# Patient Record
Sex: Female | Born: 1935
Health system: Southern US, Community
[De-identification: ages and names within clinical notes are randomized; demographics above are authoritative.]

## PROBLEM LIST (undated history)

## (undated) DIAGNOSIS — K219 Gastro-esophageal reflux disease without esophagitis: Secondary | ICD-10-CM

## (undated) DIAGNOSIS — M1990 Unspecified osteoarthritis, unspecified site: Secondary | ICD-10-CM

## (undated) DIAGNOSIS — I10 Essential (primary) hypertension: Secondary | ICD-10-CM

## (undated) DIAGNOSIS — Z1509 Genetic susceptibility to other malignant neoplasm: Secondary | ICD-10-CM

## (undated) DIAGNOSIS — R011 Cardiac murmur, unspecified: Secondary | ICD-10-CM

## (undated) DIAGNOSIS — C50919 Malignant neoplasm of unspecified site of unspecified female breast: Secondary | ICD-10-CM

## (undated) DIAGNOSIS — Z1501 Genetic susceptibility to malignant neoplasm of breast: Secondary | ICD-10-CM

## (undated) DIAGNOSIS — E785 Hyperlipidemia, unspecified: Secondary | ICD-10-CM

## (undated) HISTORY — DX: Malignant neoplasm of unspecified site of unspecified female breast: C50.919

## (undated) HISTORY — DX: Gastro-esophageal reflux disease without esophagitis: K21.9

## (undated) HISTORY — DX: Genetic susceptibility to malignant neoplasm of breast: Z15.01

## (undated) HISTORY — PX: MASTECTOMY: SHX3

## (undated) HISTORY — DX: Hyperlipidemia, unspecified: E78.5

## (undated) HISTORY — PX: CHOLECYSTECTOMY: SHX55

## (undated) HISTORY — PX: CARDIAC CATHETERIZATION: SHX172

## (undated) HISTORY — DX: Genetic susceptibility to malignant neoplasm of breast: Z15.09

## (undated) HISTORY — DX: Essential (primary) hypertension: I10

## (undated) HISTORY — DX: Cardiac murmur, unspecified: R01.1

## (undated) HISTORY — DX: Unspecified osteoarthritis, unspecified site: M19.90

## (undated) HISTORY — PX: TONSILLECTOMY: SUR1361

---

## 1992-03-22 DIAGNOSIS — C50919 Malignant neoplasm of unspecified site of unspecified female breast: Secondary | ICD-10-CM

## 1992-03-22 HISTORY — DX: Malignant neoplasm of unspecified site of unspecified female breast: C50.919

## 2005-02-05 ENCOUNTER — Encounter: Admission: RE | Admit: 2005-02-05 | Discharge: 2005-02-05 | Payer: Self-pay | Admitting: Plastic Surgery

## 2005-05-03 ENCOUNTER — Ambulatory Visit (HOSPITAL_COMMUNITY): Admission: RE | Admit: 2005-05-03 | Discharge: 2005-05-03 | Payer: Self-pay | Admitting: Plastic Surgery

## 2005-05-06 ENCOUNTER — Encounter: Admission: RE | Admit: 2005-05-06 | Discharge: 2005-05-06 | Payer: Self-pay | Admitting: Plastic Surgery

## 2005-05-26 ENCOUNTER — Encounter (INDEPENDENT_AMBULATORY_CARE_PROVIDER_SITE_OTHER): Payer: Self-pay | Admitting: Specialist

## 2005-05-26 ENCOUNTER — Ambulatory Visit (HOSPITAL_COMMUNITY): Admission: RE | Admit: 2005-05-26 | Discharge: 2005-05-27 | Payer: Self-pay | Admitting: Plastic Surgery

## 2005-07-14 ENCOUNTER — Ambulatory Visit (HOSPITAL_COMMUNITY): Admission: RE | Admit: 2005-07-14 | Discharge: 2005-07-14 | Payer: Self-pay | Admitting: Plastic Surgery

## 2007-10-10 ENCOUNTER — Inpatient Hospital Stay (HOSPITAL_COMMUNITY): Admission: AD | Admit: 2007-10-10 | Discharge: 2007-10-12 | Payer: Self-pay | Admitting: *Deleted

## 2007-10-11 ENCOUNTER — Encounter (INDEPENDENT_AMBULATORY_CARE_PROVIDER_SITE_OTHER): Payer: Self-pay | Admitting: *Deleted

## 2008-02-16 ENCOUNTER — Emergency Department (HOSPITAL_COMMUNITY): Admission: EM | Admit: 2008-02-16 | Discharge: 2008-02-16 | Payer: Self-pay | Admitting: Emergency Medicine

## 2008-04-05 ENCOUNTER — Encounter: Admission: RE | Admit: 2008-04-05 | Discharge: 2008-04-05 | Payer: Self-pay | Admitting: Plastic Surgery

## 2009-07-15 ENCOUNTER — Encounter: Admission: RE | Admit: 2009-07-15 | Discharge: 2009-07-15 | Payer: Self-pay | Admitting: Plastic Surgery

## 2010-02-10 ENCOUNTER — Encounter: Admission: RE | Admit: 2010-02-10 | Discharge: 2010-02-10 | Payer: Self-pay | Admitting: Endocrinology

## 2010-02-17 ENCOUNTER — Encounter (INDEPENDENT_AMBULATORY_CARE_PROVIDER_SITE_OTHER): Payer: Self-pay | Admitting: Endocrinology

## 2010-02-17 ENCOUNTER — Ambulatory Visit (HOSPITAL_COMMUNITY): Admission: RE | Admit: 2010-02-17 | Discharge: 2010-02-17 | Payer: Self-pay | Admitting: Endocrinology

## 2010-02-18 ENCOUNTER — Ambulatory Visit (HOSPITAL_BASED_OUTPATIENT_CLINIC_OR_DEPARTMENT_OTHER): Admission: RE | Admit: 2010-02-18 | Discharge: 2010-02-19 | Payer: Self-pay | Admitting: Plastic Surgery

## 2010-04-12 ENCOUNTER — Encounter: Payer: Self-pay | Admitting: Plastic Surgery

## 2010-05-05 ENCOUNTER — Other Ambulatory Visit: Payer: Self-pay | Admitting: Endocrinology

## 2010-05-05 DIAGNOSIS — M8580 Other specified disorders of bone density and structure, unspecified site: Secondary | ICD-10-CM

## 2010-05-12 ENCOUNTER — Other Ambulatory Visit: Payer: Self-pay

## 2010-06-02 LAB — GLUCOSE, CAPILLARY: Glucose-Capillary: 101 mg/dL — ABNORMAL HIGH (ref 70–99)

## 2010-06-02 LAB — POCT I-STAT, CHEM 8
BUN: 13 mg/dL (ref 6–23)
Calcium, Ion: 1.05 mmol/L — ABNORMAL LOW (ref 1.12–1.32)
Chloride: 103 mEq/L (ref 96–112)
Creatinine, Ser: 0.7 mg/dL (ref 0.4–1.2)
Glucose, Bld: 108 mg/dL — ABNORMAL HIGH (ref 70–99)
HCT: 43 % (ref 36.0–46.0)
Hemoglobin: 14.6 g/dL (ref 12.0–15.0)
Potassium: 3.5 mEq/L (ref 3.5–5.1)
Sodium: 140 mEq/L (ref 135–145)
TCO2: 30 mmol/L (ref 0–100)

## 2010-06-16 ENCOUNTER — Other Ambulatory Visit: Payer: Self-pay

## 2010-06-23 ENCOUNTER — Ambulatory Visit
Admission: RE | Admit: 2010-06-23 | Discharge: 2010-06-23 | Disposition: A | Discharge status: Home / Self Care | Payer: Medicare Other | Source: Ambulatory Visit | Attending: Endocrinology | Admitting: Endocrinology

## 2010-06-23 DIAGNOSIS — M8580 Other specified disorders of bone density and structure, unspecified site: Secondary | ICD-10-CM

## 2010-07-24 ENCOUNTER — Encounter: Payer: Self-pay | Admitting: Cardiovascular Disease

## 2010-07-24 DIAGNOSIS — K219 Gastro-esophageal reflux disease without esophagitis: Secondary | ICD-10-CM | POA: Insufficient documentation

## 2010-07-24 DIAGNOSIS — M1990 Unspecified osteoarthritis, unspecified site: Secondary | ICD-10-CM | POA: Insufficient documentation

## 2010-07-24 DIAGNOSIS — E785 Hyperlipidemia, unspecified: Secondary | ICD-10-CM | POA: Insufficient documentation

## 2010-07-24 DIAGNOSIS — R7303 Prediabetes: Secondary | ICD-10-CM | POA: Insufficient documentation

## 2010-07-24 DIAGNOSIS — I10 Essential (primary) hypertension: Secondary | ICD-10-CM | POA: Insufficient documentation

## 2010-07-30 ENCOUNTER — Ambulatory Visit: Payer: Medicare Other | Admitting: Cardiovascular Disease

## 2010-08-04 NOTE — Discharge Summary (Signed)
NAMEABBY, TUCHOLSKI NO.:  [id number]   MEDICAL RECORD NO.:  [id number]          PATIENT TYPE:  INP   LOCATION:  6523                         FACILITY:  [facility]   PHYSICIAN:  Elmore Guise., M.D.DATE OF BIRTH:  Apr 20, 1932   DATE OF ADMISSION:  10/10/2007  DATE OF DISCHARGE:  10/12/2007                               DISCHARGE SUMMARY   DISCHARGE DIAGNOSES:  1. Chest pain.  2. Normal-appearing coronary arteries by cardiac catheterization.  3. Hypertension.  4. Dyslipidemia.  5. Diabetes mellitus.  6. Hypothyroidism.  7. Arthritis.   HISTORY OF PRESENT ILLNESS:  Ms. Carolyn Stare is a 77 year old white female  who presented with 2 day history of increasing chest pain.  She was  referred for hospital admission because of her symptoms and multiple  cardiac risk factors.   HOSPITAL COURSE:  The patient's hospital course was uncomplicated.  She  was admitted on July 21, had serial cardiac enzymes performed, which  were negative.  She was placed on long-acting nitrates, aspirin, low-  dose beta-blocker. She underwent echocardiogram on October 11, 2007, which  showed normal LV size and function with an EF of 55-60%.  She had no  wall motion abnormalities.  She had mild aortic valve regurgitation.  She continued to have chest pain.  Her metformin was held.  She  underwent cardiac catheterization on October 12, 2007.  This showed normal-  appearing coronary arteries, normal LV systolic function with an EF of  65% and normal aortic root, only mild proximal ectasia was noted, which  was consistent with her long history of hypertension.  Her post cath  procedure was uncomplicated.  She has been up walking around without any  significant problems.  She has had no further chest pain following her  catheterization.  She will be discharged home today to continue the  following medications:  1. Lotensin/hydrochlorothiazide 20/25 mg 1/2 tablet daily.  2. Synthroid 112 mcg daily.  3.  Mobic half tablet p.r.n.  4. Percocet 1-2 tablets every 4-6 hours as needed for pain.  5. Imdur 30 mg one p.o. daily.  6. Protonix 40 mg one p.o. daily.  7. Baby aspirin 81 mg once daily.  8. The patient was told to hold her metformin for the next 48 hours      and she may resume her dose of 500 mg 1 tablet twice daily.  9. She is to stop her Norvasc.   Her blood pressure on low-dose Lotensin and Imdur remained normal in the  90-110 range.   DISCHARGE INSTRUCTIONS:  Include no heavy lifting or strenuous  activities for the next 48 hours and holding her metformin for the next  48 hours.  She is to call the office if she has any swelling at her cath  site.  She is also notify us if she has any bleeding.  Her follow-up  appointment will be with Dr. [name] in 2 weeks.  Otherwise, she will  keep her regular scheduled visit with Dr. Lucianne Muss.      Elmore Guise., M.D.  Electronically Signed  TWK/MEDQ  D:  10/12/2007  T:  10/12/2007  Job:  [id number]   cc:   Reather Littler, M.D.

## 2010-08-04 NOTE — H&P (Signed)
[name], [name] NO.:  [id number]   MEDICAL RECORD NO.:  [id number]          PATIENT TYPE:  INP   LOCATION:  6523                         FACILITY:  [facility]   PHYSICIAN:  Elmore Guise., M.D.DATE OF BIRTH:  02/27/1933   DATE OF ADMISSION:  10/10/2007  DATE OF DISCHARGE:                              HISTORY & PHYSICAL   PRIMARY CARE PHYSICIAN:  Reather Littler, M.D.   HISTORY OF PRESENT ILLNESS:  [name] is a 77 year old white female,  past medical history of type 2 diabetes mellitus, hypertension,  hypothyroidism, dyslipidemia, who presents for chest discomfort for the  last 2 days.  The patient reports a normal state of health until 2 days  ago, when she started feeling a pressure and tightness in her chest.  This would come and go throughout the day, initially stating that it was  only happening with activity; however, now it has been happening with  and without activity.  She will have this tightness associated with  shortness of breath.  She will try to sit down and she will feel like  I'm smothering.  She has had progression of her symptoms, now coming  on more frequently and lasting longer.  Her symptoms progressed  yesterday when she about called 911 twice.  She has not had any past  history of coronary disease.  Normally she is up and around, however,  has chronic knee problems.  She goes and has injections on her knees,  however, typically can ambulate in and around the house as well as with  shopping without any significant problems.  She has had no orthopnea.  She did have PND last evening.  She has had no fever or productive  cough.  No significant palpitations.  Today she started having episodes  of presyncope, typically on standing.  She has had no abdominal pain or  indigestion.  No nausea, vomiting or diarrhea.   REVIEW OF SYSTEMS:  As per HPI.  All others are negative.   CURRENT MEDICATIONS:  1. Lotensin 20/25 mg once daily.  2.  Norvasc 10 mg daily.  3. Metformin 500 mg 1 tablet twice daily.  4. Synthroid 112 mcg daily.  5. Percocet p.r.n.  6. Euflexxa knee injection 3 times per week.  7. Mobic 15 mg 1/2 tablet daily.   ALLERGIES:  PENICILLIN, CAUSING ITCHING AND EDEMA.   FAMILY HISTORY:  Positive for bladder cancer, pancreatic cancer, lung  cancer.  She has a remote family history of her maternal grandmother  dying of a heart attack.   SOCIAL HISTORY:  She is widowed and currently retired.  Does not  exercise.  No tobacco.  Rare alcohol intake.  Does drink 1 cup of coffee  and 2 to 3 glasses of tea daily.   PAST SURGICAL HISTORY:  Includes left breast mastectomy with  reconstruction and redo of her left breast reconstruction.  She has also  had a hysterectomy, cholecystectomy as well as tonsillectomy.   PHYSICAL EXAMINATION:  VITAL SIGNS:  Her blood pressure is 130/90 with  heart rate of 80.  Her  temperature is 98.1.  O2 sat is 98% on room air.  GENERAL:  She is a very pleasant white female, alert and oriented x 4,  in no acute distress.  HEENT:  Appeared normal.  NECK:  Supple.  No lymphadenopathy.  There are 2+ carotids.  No JVD.  No  bruits.  LUNGS:  Clear.  HEART:  Regular with normal S1, S2, a 2/6 systolic ejection murmur heard  loudest at the aortic area.  ABDOMEN:  Soft, nontender, nondistended.  No rebound or guarding.  EXTREMITIES:  Warm with 2+ pulses and no significant edema.   Her EKG was reviewed, showed normal sinus rhythm, rate of 80 per minute,  normal axis, normal intervals with no significant ST/T wave changes.  She did have poor R-wave progression across the precordial leads.  Her  chest x-ray was also done and reported to be normal.  Blood work is  still pending at time of dictation.   IMPRESSION:  1. New-onset chest pain.  2. Multiple cardiac risk factors, including hypertension, diabetes      mellitus, dyslipidemia.   PLAN:  The patient will be admitted for rule out  myocardial infarction.  We will check an echo to evaluate her aortic valve.  I discussed that we  would determine stress versus cardiac cath depending on her progress  during her hospitalization.  She will be started empirically on nitro  paste 1/2 inch to her chest wall every 8 hours, aspirin, Lovenox,  metoprolol 12.5 mg twice daily.  We will continue her Lotensin, Norvasc.  I will hold her metformin in case contrast exposure is needed.      Elmore Guise., M.D.  Electronically Signed     TWK/MEDQ  D:  10/10/2007  T:  10/10/2007  Job:  [id number]   cc:   Reather Littler, M.D.

## 2010-08-07 NOTE — Op Note (Signed)
NAMETEANA, LINDAHL NO.:  [id number]   MEDICAL RECORD NO.:  [id number]          PATIENT TYPE:  OIB   LOCATION:  5706                         FACILITY:  [facility]   PHYSICIAN:  Consuello Bossier., M.D.DATE OF BIRTH:  [birth year]   DATE OF PROCEDURE:  05/26/2005  DATE OF DISCHARGE:                                 OPERATIVE REPORT   PREOPERATIVE DIAGNOSIS:  Mechanical complication mammary prosthesis and  capsular contracture with calcification of right mammary implant.   POSTOPERATIVE DIAGNOSIS:  Mechanical complication mammary prosthesis and  capsular contracture with calcification of right mammary implant.   OPERATION:  Bilateral Total capsulotomy on the right and replacement of  bilateral implants on the right Mentor smooth wall saline pill prosthesis,  275 mL normal saline added and on the left, Mentor smooth wall saline pill  prosthesis, 600 mL normal saline added.   SURGEON:  Pleas Patricia, M.D.   ANESTHESIA:  General endotracheal anesthesia.   FINDINGS:  The patient had a previous left modified radical mastectomy  followed by tissue expansion and replacement with an implant.  Also she had  had a previous right augmentation mammoplasty many years ago with a Jelco  prosthesis and was noted to have a probable mechanical complication of both  implants as well as a capsular contracture on the right.  The above surgical  procedure was carried out.   DESCRIPTION OF PROCEDURE:  Patient was brought to the operating room, marked  off the planned scar excision of the previous inferior circumareolar scar as  well as the widened scar on the upper outer aspect of the transverse  mastectomy scar. She was given a general endotracheal anesthesia, prepped  with Betadine and draped about both breasts in sterile fashion.  Initially,  the right side was approached by removing the widened scar and dissection  continued down through the breast tissue with electrocautery  unit until the  anterior capsule was encountered.  With fiberoptic illumination, a total  capsulectomy was able to be performed.  The implant was obviously an older  gel filled implant with posterior patches and there was some gel bleed.  There was no gross rupture of the implant bag.  There were some  calcifications of the capsule and the capsule and implant were submitted for  pathological examination.  The wound was irrigated with normal saline and an  implant which was a Mentor smooth wall saline pill prosthesis, was placed  and 275 mL of normal saline added.  It should be noted that the right  implant that was removed weighed just over 200 g.  On the left side,  previous scar was excised and the pectoralis major muscle was split in the  direction of its fibers and anterior capsule opened and what appeared to be  a saline gel prosthesis was removed.  It is not known exactly how much of  the saline was still present.  This was a somewhat textured implant and was,  other than possible loss of some of the saline, was noted to be essentially  intact.  It was weighed and weighed  approximately 700 g.  The implant that  had been selected to be used would only go up to approximately 425 mL and I  was able to get a larger implant from my office which was the largest one we  had, which was a Mentor smooth wall saline pill prosthesis, and was  overfilled 75 mL for a total of 600 mL on this left reconstructed breast  side.  Muscle was closed with interrupted 3-0 Vicryl and the skin was closed  after the skin edges were undermined with interrupted subcutaneous 3-0  Monocryl, followed by running subcuticular 4-0 Monocryl.  It should be  mentioned, on the right, the breast tissue was closed similarly with  interrupted 3-0 Monocryl  to the breast tissue, also to the subcutaneous tissues and running  subcuticular 4-0 Monocryl.  Steri-Strips, Xeroform, 4x8s, ABD, and  Circumpress, Ace bandage were  applied.  The patient tolerated the procedure  well and was able to be discharged from the operating room to the recovery  room, subsequently to be admitted for overnight observation.      Consuello Bossier., M.D.  Electronically Signed     HH/MEDQ  D:  05/26/2005  T:  05/27/2005  Job:  [id number]

## 2010-08-07 NOTE — Op Note (Signed)
NAMEWESLIE, RASMUS NO.:  [id number]   MEDICAL RECORD NO.:  [id number]          PATIENT TYPE:  AMB   LOCATION:  SDS                          FACILITY:  [facility]   PHYSICIAN:  Consuello Bossier., M.D.DATE OF BIRTH:  September 02, 1932   DATE OF PROCEDURE:  07/14/2005  DATE OF DISCHARGE:  07/14/2005                                 OPERATIVE REPORT   PREOPERATIVE DIAGNOSIS:  Personal history, carcinoma of the breast, and  acquired absence, left breast, with mammary asymmetry.   POSTOPERATIVE DIAGNOSIS:  Personal history, carcinoma of the breast, and  acquired absence, left breast, with mammary asymmetry.   OPERATION PERFORMED:  Left breast stage reconstruction with removal of 600  cc implant and replacement with Mentor, smooth walled, moderate plus  profile, 875 cc normal saline added.   SURGEON:  Pleas Patricia, M.D.   ANESTHESIA:  General endotracheal.   FINDINGS:  The patient has had previous left modified radical mastectomy for  cancer of the breast.  Also, had some implant problems for which exchange of  bilateral implants was performed approximately six weeks ago.  The records  were not available for the size of the implants, and it appeared that she  needed a larger implant, and an additional 275 cc of normal saline were  added to this current implant above and beyond the previous removed implant  to give her a similar volume, compared to the implants that had been removed  at surgery and replaced.  Also, a capsulotomy superiorly and slightly  inferiorly was performed.   PROCEDURE:  The patient was brought to the operating room, given a general  endotracheal anesthetic, prepped with Betadine, draped about both breasts in  a sterile fashion.  The previous oblique incision was excised along the  medial aspect, and dissection continued down.  The pectoralis major muscle  was encountered.  With the electrocautery unit, the muscle and anterior  capsule were  split in the direction of the muscle fibers.  The 600 cc  implant was removed.  With fiberoptic elimination, a somewhat superior  capsulotomy and a slight inferior capsulotomy was performed.  The new  implant was placed into position, and it was inflated to 775 cc, which was  by previous measurements of the implants that were removed, the additional  amount required.  The left breast implant that was initially removed did  have a saline component which the patient and I had felt had leaked probably  the 100 cc.  So, an additional 100 cc were placed in this current implant to  give a total volume of 875 cc.  The breasts appeared to be reasonably  symmetrical at this point.  The muscle was closed with an interrupted #3-0  Vicryl, and the subcutaneous tissues with interrupted #3-0 Monocryl, and the  skin with running subcuticular #4-0 Monocryl.  Steri-  Strips, Xeroform, Fluffs, ABDs, and a circumfrential Ace bandage were  applied.  The patient tolerated the procedure well, was able to be  discharged from the operating room to the recovery room, subsequently to be  followed by me as  an outpatient.      Consuello Bossier., M.D.  Electronically Signed     HH/MEDQ  D:  07/14/2005  T:  07/14/2005  Job:  [id number]

## 2010-08-11 ENCOUNTER — Other Ambulatory Visit: Payer: Self-pay | Admitting: Endocrinology

## 2010-08-11 ENCOUNTER — Ambulatory Visit
Admission: RE | Admit: 2010-08-11 | Discharge: 2010-08-11 | Disposition: A | Discharge status: Home / Self Care | Payer: Medicare Other | Source: Ambulatory Visit | Attending: Endocrinology | Admitting: Endocrinology

## 2010-08-11 DIAGNOSIS — R059 Cough, unspecified: Secondary | ICD-10-CM

## 2010-08-11 DIAGNOSIS — R05 Cough: Secondary | ICD-10-CM

## 2010-08-18 ENCOUNTER — Ambulatory Visit: Payer: Medicare Other | Admitting: Cardiovascular Disease

## 2010-08-20 ENCOUNTER — Other Ambulatory Visit: Payer: Self-pay | Admitting: *Deleted

## 2010-08-20 MED ORDER — ISOSORBIDE MONONITRATE ER 30 MG PO TB24: 30.0000 mg | ORAL_TABLET | Freq: Every day | ORAL | Status: DC

## 2010-08-20 NOTE — Telephone Encounter (Signed)
Fax received from pharmacy. Refill completed. Jodette Ritha Sampedro RN  

## 2010-09-17 ENCOUNTER — Encounter: Payer: Self-pay | Admitting: Cardiovascular Disease

## 2010-09-17 ENCOUNTER — Ambulatory Visit (INDEPENDENT_AMBULATORY_CARE_PROVIDER_SITE_OTHER): Payer: Medicare Other | Admitting: Cardiovascular Disease

## 2010-09-17 DIAGNOSIS — E785 Hyperlipidemia, unspecified: Secondary | ICD-10-CM

## 2010-09-17 DIAGNOSIS — R079 Chest pain, unspecified: Secondary | ICD-10-CM

## 2010-09-17 MED ORDER — ISOSORBIDE MONONITRATE ER 30 MG PO TB24: 30.0000 mg | ORAL_TABLET | Freq: Every day | ORAL | Status: DC

## 2010-09-17 NOTE — Assessment & Plan Note (Signed)
Atypical cp.  Never occurs with exercise - just first thing in the AM and is associated with twisting of her torso.  She may have sleep apnea.  I recommended a Lexiscan myoview - but she declined.  She will try to get out and exercise.  She's limited by arthritis.  Will see her again in 1 year.

## 2010-09-17 NOTE — Progress Notes (Signed)
Angela [name] Ward Angela [name] Date of Birth  [birth year] [facility] [address]     [address] [place], [place]  [address] [phone]  Fax  [phone]  History of Present Illness:  Angela [name] Is an elderly female who is a previous patient of Dr. [name]. She has had some chest pain in the past. She has had a heart catheterization which revealed smooth and normal coronary arteries. Her left ventricular systolic function has been normal with an ejection fraction of 55-60%. She has mild aortic insufficiency, mild mitral regurgitation, and mild tricuspid regurgitation.  She also has a history of dyslipidemia, hypertension,  and diabetes mellitus. She will does not like going to the doctor.   Pt has a history of atypical CP.  Normal coronaries in the past.  Has developed cp - usually occurs first thing in the morning when she's brushing her hair.  Not related to exertion.  No syncope, no dyspnea.    Current Outpatient Prescriptions on File Prior to Visit  Medication Sig Dispense Refill  . benazepril-hydrochlorthiazide (LOTENSIN HCT) 20-25 MG per tablet Take 1 tablet by mouth daily. 1/2 DAILY       . isosorbide mononitrate (IMDUR) 30 MG 24 hr tablet Take 1 tablet (30 mg total) by mouth daily.  30 tablet  0  . levothyroxine (SYNTHROID, LEVOTHROID) 112 MCG tablet Take 100 mcg by mouth daily.       . meloxicam (MOBIC) 15 MG tablet Take 15 mg by mouth daily.        . metFORMIN (GLUCOPHAGE) 500 MG tablet Take 500 mg by mouth 2 (two) times daily with a meal.        . oxyCODONE-acetaminophen (PERCOCET) 5-325 MG per tablet Take 1 tablet by mouth every 4 (four) hours as needed.        . pantoprazole (PROTONIX) 40 MG tablet Take 40 mg by mouth daily.        . Sodium Hyaluronate, Viscosup, (EUFLEXXA IX) Inject into the articular space as directed.       Marland Kitchen DISCONTD: aspirin 81 MG tablet Take 81 mg by mouth daily.          Allergies  Allergen Reactions  . Gadolinium      Desc:  pt states she had nausea after receiving MAGNEVIST for breast imaging   . Penicillins     Past Medical History  Diagnosis Date  . Hypertension   . Dyslipidemia   . Diabetes mellitus   . Arthritis   . GERD (gastroesophageal reflux disease)     Past Surgical History  Procedure Date  . Cardiac catheterization     NORMAL CORONARY ARTERIES  . Mastectomy     LEFT BREAST  . Cholecystectomy   . Tonsillectomy     History  Smoking status  . Never Smoker   Smokeless tobacco  . Not on file    History  Alcohol Use No    Family History  Problem Relation Age of Onset  . Heart attack Maternal Grandmother   . Cancer      BLADDER  . Pancreatic cancer    . Lung cancer    . Cancer Mother   . Cancer Father   . Ovarian cancer Sister   . Hypertension Sister   . Lung cancer Brother     Reviw of Systems:  Reviewed in the HPI.  All other systems are negative.  Physical Exam: BP 138/82  Pulse 89  Ht 5\' 8"  (1.727  m)  Wt 235 lb (106.595 kg)  BMI 35.73 kg/m2 The patient is alert and oriented x 3.  The mood and affect are normal.   Skin: warm and dry.  Color is normal.    HEENT:   the sclera are nonicteric.  The mucous membranes are moist.  The carotids are 2+ without bruits.  There is no thyromegaly.  There is no JVD.    Lungs: clear.  The chest wall is non tender.    Heart: regular rate with a normal S1 and S2.  There is a 2/6 systolic murmur. The PMI is not displaced.     Abdomin: good bowel sounds.  There is no guarding or rebound.  There is no hepatosplenomegaly or tenderness.  There are no masses.   Extremities:  no clubbing, cyanosis, or edema.  The legs are without rashes.  The distal pulses are intact.   Neuro:  Cranial nerves II - XII are intact.  Motor and sensory functions are intact.    The gait is normal.  ECG: NSR, LAE.   Assessment / Plan:

## 2010-09-17 NOTE — Assessment & Plan Note (Signed)
I would prefer that she take Atorvaststin instead of Simvastatin.  She will discuss with Dr. Lucianne Muss.

## 2010-09-24 NOTE — Op Note (Signed)
Angela [name], ALWIN NO.:  [id number]  MEDICAL RECORD NO.:  [id number]          PATIENT TYPE:  AMB  LOCATION:  DSC                          FACILITY:  [facility]  PHYSICIAN:  Consuello Bossier., M.D.DATE OF BIRTH:  Feb 02, 1933  DATE OF PROCEDURE:  02/18/2010 DATE OF DISCHARGE:                              OPERATIVE REPORT   PREOPERATIVE DIAGNOSIS:  Personal history carcinoma of breast with evidence of left breast and a right breast capsular contracture and a left breast implant high in position with breast asymmetry.  POSTOPERATIVE DIAGNOSIS:  Personal history carcinoma of breast with evidence of left breast and a right breast capsular contracture and a left breast implant high in position with breast asymmetry.  OPERATION:  Bilateral open capsulotomy, implant removal, and replacement with Mentor smooth-walled 250 mL gel moderate profile on right and 800 mL gentle high-profile on the left.  SURGEON:  Pleas Patricia, MD  ASSISTANT:  Loreta Ave, MD  ANESTHESIA:  General endotracheal.  FINDINGS:  The patient has had previous left modified radical mastectomy for carcinoma of the breast and undergone multiple breast reconstructions including prosthesis on the left, which was somewhat high in position and she had, had a previous right breast augmentation with a significant amount of capsular contracture.  The above procedure was performed.  PROCEDURE:  The patient was brought to the operating room having been marked for the planned surgical procedure to do a level of implants, capsulotomy particularly on the right side and have her overcome the capsular contracture and deliver the implants on the left side and replace it with a high profile implant.  In both cases, the patient was desirous of gel filled implants.  She had been given general endotracheal anesthetic, prepped with Betadine, draped sterilely. Initially the right breast was approached  by excision of the inferior circumareolar scar and the dissection continued down through the breast tissue until the anterior capsule was encountered.  Capsule opened implant  and a saline filled implant removed.  The capsule itself was fairly gossamer and thin in consistency and a generous open capsulotomy was performed in all directions and some of the anterior dome also incised.  The wound was irrigated with normal saline.  There was noted to be hemostasis.  The 250 mL gel-filled moderate profile Mentor implant was placed and the capsule and breast tissue closed in layers of interrupted #3-0 Monocryl followed by running subcuticular #4-0 Monocryl.  The patient had a somewhat prominent scar along the medial aspect of her previous oblique transverse mastectomy incision and that was excised and the rest of the incision made with excision of the scar and then dissection continued down, the muscle was split in direction of its fibers, and the anterior capsule opened and the 850 mL saline-filled implant was removed.  The fiberoptic illumination, inferior and medial primary capsulotomy was performed to try to position implant in somewhat of a low and more medial position.  Bleeding was controlled with electrocautery and there was noted to be good hemostasis.  The 800 mL Mentor high profile gel-filled prosthesis was placed, noted to be reasonable in terms  of its position and size related to the opposite side.  At this point, the capsule of breast tissue and muscle were closed with interrupted #3-0 Vicryl and the subcutaneous tissues closed with interrupted #3-0 Monocryl, skin closed with running subcuticular #4- 0 Monocryl.  Steri-Strips, 4 x 8, ABD, and circum thoracic compression dressing were applied.  The patient tolerated the procedure well and was able to be discharged from the operating room to the recovery room subsequently to be admitted for overnight observation.     Consuello Bossier., M.D.     HH/MEDQ  D:  02/18/2010  T:  02/18/2010  Job:  [id number]  Electronically Signed by Pleas Patricia M.D. on 09/24/2010 10:10:05 AM

## 2010-12-18 LAB — COMPREHENSIVE METABOLIC PANEL
Alkaline Phosphatase: 66
BUN: 13
CO2: 30
Chloride: 103
Creatinine, Ser: 0.64
GFR calc non Af Amer: >60
Glucose, Bld: 102 — ABNORMAL HIGH
Potassium: 3.4 — ABNORMAL LOW
Total Bilirubin: 1

## 2010-12-18 LAB — CARDIAC PANEL(CRET KIN+CKTOT+MB+TROPI)
CK, MB: 0.6
Relative Index: INVALID
Relative Index: INVALID
Total CK: 35
Total CK: 56
Troponin I: 0.02

## 2010-12-18 LAB — BASIC METABOLIC PANEL
BUN: 13
BUN: 17
Calcium: 8.8
Chloride: 104
Creatinine, Ser: 0.58
Creatinine, Ser: 0.67
GFR calc non Af Amer: >60
Glucose, Bld: 108 — ABNORMAL HIGH
Glucose, Bld: 94
Potassium: 3.3 — ABNORMAL LOW
Potassium: 4

## 2010-12-18 LAB — URINALYSIS, ROUTINE W REFLEX MICROSCOPIC
Glucose, UA: NEGATIVE
Nitrite: NEGATIVE
Specific Gravity, Urine: 1.026
pH: 6

## 2010-12-18 LAB — LIPID PANEL
Cholesterol: 182
LDL Cholesterol: 124 — ABNORMAL HIGH
Triglycerides: 79

## 2010-12-18 LAB — CBC
HCT: 43.3
MCV: 89.2
Platelets: 207

## 2010-12-18 LAB — D-DIMER, QUANTITATIVE: D-Dimer, Quant: 0.48

## 2010-12-18 LAB — PROTIME-INR
INR: 0.9
Prothrombin Time: 12

## 2011-03-24 DIAGNOSIS — M1710 Unilateral primary osteoarthritis, unspecified knee: Secondary | ICD-10-CM | POA: Diagnosis not present

## 2011-04-21 DIAGNOSIS — M1710 Unilateral primary osteoarthritis, unspecified knee: Secondary | ICD-10-CM | POA: Diagnosis not present

## 2011-04-27 DIAGNOSIS — E785 Hyperlipidemia, unspecified: Secondary | ICD-10-CM | POA: Diagnosis not present

## 2011-04-27 DIAGNOSIS — R7301 Impaired fasting glucose: Secondary | ICD-10-CM | POA: Diagnosis not present

## 2011-04-27 DIAGNOSIS — E039 Hypothyroidism, unspecified: Secondary | ICD-10-CM | POA: Diagnosis not present

## 2011-04-27 DIAGNOSIS — E559 Vitamin D deficiency, unspecified: Secondary | ICD-10-CM | POA: Diagnosis not present

## 2011-05-04 DIAGNOSIS — E785 Hyperlipidemia, unspecified: Secondary | ICD-10-CM | POA: Diagnosis not present

## 2011-05-04 DIAGNOSIS — R7301 Impaired fasting glucose: Secondary | ICD-10-CM | POA: Diagnosis not present

## 2011-05-04 DIAGNOSIS — I10 Essential (primary) hypertension: Secondary | ICD-10-CM | POA: Diagnosis not present

## 2011-05-04 DIAGNOSIS — M899 Disorder of bone, unspecified: Secondary | ICD-10-CM | POA: Diagnosis not present

## 2011-05-04 DIAGNOSIS — M949 Disorder of cartilage, unspecified: Secondary | ICD-10-CM | POA: Diagnosis not present

## 2011-05-04 DIAGNOSIS — E039 Hypothyroidism, unspecified: Secondary | ICD-10-CM | POA: Diagnosis not present

## 2011-05-19 DIAGNOSIS — M1710 Unilateral primary osteoarthritis, unspecified knee: Secondary | ICD-10-CM | POA: Diagnosis not present

## 2011-06-09 DIAGNOSIS — M1710 Unilateral primary osteoarthritis, unspecified knee: Secondary | ICD-10-CM | POA: Diagnosis not present

## 2011-06-11 DIAGNOSIS — L578 Other skin changes due to chronic exposure to nonionizing radiation: Secondary | ICD-10-CM | POA: Diagnosis not present

## 2011-06-11 DIAGNOSIS — L570 Actinic keratosis: Secondary | ICD-10-CM | POA: Diagnosis not present

## 2011-06-30 DIAGNOSIS — M1710 Unilateral primary osteoarthritis, unspecified knee: Secondary | ICD-10-CM | POA: Diagnosis not present

## 2011-07-28 DIAGNOSIS — M1710 Unilateral primary osteoarthritis, unspecified knee: Secondary | ICD-10-CM | POA: Diagnosis not present

## 2011-08-24 DIAGNOSIS — E039 Hypothyroidism, unspecified: Secondary | ICD-10-CM | POA: Diagnosis not present

## 2011-08-24 DIAGNOSIS — E785 Hyperlipidemia, unspecified: Secondary | ICD-10-CM | POA: Diagnosis not present

## 2011-08-24 DIAGNOSIS — M899 Disorder of bone, unspecified: Secondary | ICD-10-CM | POA: Diagnosis not present

## 2011-08-24 DIAGNOSIS — R7301 Impaired fasting glucose: Secondary | ICD-10-CM | POA: Diagnosis not present

## 2011-08-25 DIAGNOSIS — M1710 Unilateral primary osteoarthritis, unspecified knee: Secondary | ICD-10-CM | POA: Diagnosis not present

## 2011-08-31 DIAGNOSIS — M1710 Unilateral primary osteoarthritis, unspecified knee: Secondary | ICD-10-CM | POA: Diagnosis not present

## 2011-08-31 DIAGNOSIS — E039 Hypothyroidism, unspecified: Secondary | ICD-10-CM | POA: Diagnosis not present

## 2011-08-31 DIAGNOSIS — M949 Disorder of cartilage, unspecified: Secondary | ICD-10-CM | POA: Diagnosis not present

## 2011-08-31 DIAGNOSIS — E7800 Pure hypercholesterolemia, unspecified: Secondary | ICD-10-CM | POA: Diagnosis not present

## 2011-08-31 DIAGNOSIS — R7301 Impaired fasting glucose: Secondary | ICD-10-CM | POA: Diagnosis not present

## 2011-08-31 DIAGNOSIS — M899 Disorder of bone, unspecified: Secondary | ICD-10-CM | POA: Diagnosis not present

## 2011-08-31 DIAGNOSIS — IMO0002 Reserved for concepts with insufficient information to code with codable children: Secondary | ICD-10-CM | POA: Diagnosis not present

## 2011-09-14 DIAGNOSIS — L089 Local infection of the skin and subcutaneous tissue, unspecified: Secondary | ICD-10-CM | POA: Diagnosis not present

## 2011-09-14 DIAGNOSIS — D485 Neoplasm of uncertain behavior of skin: Secondary | ICD-10-CM | POA: Diagnosis not present

## 2011-09-14 DIAGNOSIS — L723 Sebaceous cyst: Secondary | ICD-10-CM | POA: Diagnosis not present

## 2011-09-15 DIAGNOSIS — M1710 Unilateral primary osteoarthritis, unspecified knee: Secondary | ICD-10-CM | POA: Diagnosis not present

## 2011-10-13 DIAGNOSIS — M1710 Unilateral primary osteoarthritis, unspecified knee: Secondary | ICD-10-CM | POA: Diagnosis not present

## 2011-10-18 DIAGNOSIS — L98499 Non-pressure chronic ulcer of skin of other sites with unspecified severity: Secondary | ICD-10-CM | POA: Diagnosis not present

## 2011-10-18 DIAGNOSIS — L259 Unspecified contact dermatitis, unspecified cause: Secondary | ICD-10-CM | POA: Diagnosis not present

## 2011-10-18 DIAGNOSIS — D485 Neoplasm of uncertain behavior of skin: Secondary | ICD-10-CM | POA: Diagnosis not present

## 2011-11-10 DIAGNOSIS — M1710 Unilateral primary osteoarthritis, unspecified knee: Secondary | ICD-10-CM | POA: Diagnosis not present

## 2011-11-26 DIAGNOSIS — L080 Pyoderma: Secondary | ICD-10-CM | POA: Diagnosis not present

## 2011-11-27 DIAGNOSIS — E7800 Pure hypercholesterolemia, unspecified: Secondary | ICD-10-CM | POA: Diagnosis not present

## 2011-11-27 DIAGNOSIS — K219 Gastro-esophageal reflux disease without esophagitis: Secondary | ICD-10-CM | POA: Diagnosis not present

## 2011-11-27 DIAGNOSIS — S81009A Unspecified open wound, unspecified knee, initial encounter: Secondary | ICD-10-CM | POA: Diagnosis not present

## 2011-11-27 DIAGNOSIS — I10 Essential (primary) hypertension: Secondary | ICD-10-CM | POA: Diagnosis not present

## 2011-11-27 DIAGNOSIS — S91009A Unspecified open wound, unspecified ankle, initial encounter: Secondary | ICD-10-CM | POA: Diagnosis not present

## 2011-11-27 DIAGNOSIS — Z23 Encounter for immunization: Secondary | ICD-10-CM | POA: Diagnosis not present

## 2011-11-27 DIAGNOSIS — S81809A Unspecified open wound, unspecified lower leg, initial encounter: Secondary | ICD-10-CM | POA: Diagnosis not present

## 2011-11-27 DIAGNOSIS — G8911 Acute pain due to trauma: Secondary | ICD-10-CM | POA: Diagnosis not present

## 2011-11-27 DIAGNOSIS — Z880 Allergy status to penicillin: Secondary | ICD-10-CM | POA: Diagnosis not present

## 2011-11-27 DIAGNOSIS — E119 Type 2 diabetes mellitus without complications: Secondary | ICD-10-CM | POA: Diagnosis not present

## 2011-11-29 DIAGNOSIS — S91009A Unspecified open wound, unspecified ankle, initial encounter: Secondary | ICD-10-CM | POA: Diagnosis not present

## 2011-11-29 DIAGNOSIS — S81809A Unspecified open wound, unspecified lower leg, initial encounter: Secondary | ICD-10-CM | POA: Diagnosis not present

## 2011-11-29 DIAGNOSIS — S81009A Unspecified open wound, unspecified knee, initial encounter: Secondary | ICD-10-CM | POA: Diagnosis not present

## 2011-11-30 DIAGNOSIS — W540XXA Bitten by dog, initial encounter: Secondary | ICD-10-CM | POA: Diagnosis not present

## 2011-11-30 DIAGNOSIS — I10 Essential (primary) hypertension: Secondary | ICD-10-CM | POA: Diagnosis not present

## 2011-11-30 DIAGNOSIS — S81009A Unspecified open wound, unspecified knee, initial encounter: Secondary | ICD-10-CM | POA: Diagnosis not present

## 2011-11-30 DIAGNOSIS — E119 Type 2 diabetes mellitus without complications: Secondary | ICD-10-CM | POA: Diagnosis not present

## 2011-11-30 DIAGNOSIS — M1710 Unilateral primary osteoarthritis, unspecified knee: Secondary | ICD-10-CM | POA: Diagnosis not present

## 2011-11-30 DIAGNOSIS — IMO0002 Reserved for concepts with insufficient information to code with codable children: Secondary | ICD-10-CM | POA: Diagnosis not present

## 2011-11-30 DIAGNOSIS — S91009A Unspecified open wound, unspecified ankle, initial encounter: Secondary | ICD-10-CM | POA: Diagnosis not present

## 2011-12-01 DIAGNOSIS — W540XXA Bitten by dog, initial encounter: Secondary | ICD-10-CM | POA: Diagnosis not present

## 2011-12-01 DIAGNOSIS — S81009A Unspecified open wound, unspecified knee, initial encounter: Secondary | ICD-10-CM | POA: Diagnosis not present

## 2011-12-01 DIAGNOSIS — I10 Essential (primary) hypertension: Secondary | ICD-10-CM | POA: Diagnosis not present

## 2011-12-01 DIAGNOSIS — IMO0002 Reserved for concepts with insufficient information to code with codable children: Secondary | ICD-10-CM | POA: Diagnosis not present

## 2011-12-01 DIAGNOSIS — M1710 Unilateral primary osteoarthritis, unspecified knee: Secondary | ICD-10-CM | POA: Diagnosis not present

## 2011-12-01 DIAGNOSIS — E119 Type 2 diabetes mellitus without complications: Secondary | ICD-10-CM | POA: Diagnosis not present

## 2011-12-02 DIAGNOSIS — S81009A Unspecified open wound, unspecified knee, initial encounter: Secondary | ICD-10-CM | POA: Diagnosis not present

## 2011-12-02 DIAGNOSIS — IMO0002 Reserved for concepts with insufficient information to code with codable children: Secondary | ICD-10-CM | POA: Diagnosis not present

## 2011-12-02 DIAGNOSIS — M1710 Unilateral primary osteoarthritis, unspecified knee: Secondary | ICD-10-CM | POA: Diagnosis not present

## 2011-12-02 DIAGNOSIS — I10 Essential (primary) hypertension: Secondary | ICD-10-CM | POA: Diagnosis not present

## 2011-12-02 DIAGNOSIS — S91009A Unspecified open wound, unspecified ankle, initial encounter: Secondary | ICD-10-CM | POA: Diagnosis not present

## 2011-12-02 DIAGNOSIS — W540XXA Bitten by dog, initial encounter: Secondary | ICD-10-CM | POA: Diagnosis not present

## 2011-12-02 DIAGNOSIS — E119 Type 2 diabetes mellitus without complications: Secondary | ICD-10-CM | POA: Diagnosis not present

## 2011-12-03 DIAGNOSIS — M1710 Unilateral primary osteoarthritis, unspecified knee: Secondary | ICD-10-CM | POA: Diagnosis not present

## 2011-12-03 DIAGNOSIS — I10 Essential (primary) hypertension: Secondary | ICD-10-CM | POA: Diagnosis not present

## 2011-12-03 DIAGNOSIS — S81009A Unspecified open wound, unspecified knee, initial encounter: Secondary | ICD-10-CM | POA: Diagnosis not present

## 2011-12-03 DIAGNOSIS — IMO0002 Reserved for concepts with insufficient information to code with codable children: Secondary | ICD-10-CM | POA: Diagnosis not present

## 2011-12-03 DIAGNOSIS — W540XXA Bitten by dog, initial encounter: Secondary | ICD-10-CM | POA: Diagnosis not present

## 2011-12-03 DIAGNOSIS — E119 Type 2 diabetes mellitus without complications: Secondary | ICD-10-CM | POA: Diagnosis not present

## 2011-12-03 DIAGNOSIS — S91009A Unspecified open wound, unspecified ankle, initial encounter: Secondary | ICD-10-CM | POA: Diagnosis not present

## 2011-12-04 DIAGNOSIS — W540XXA Bitten by dog, initial encounter: Secondary | ICD-10-CM | POA: Diagnosis not present

## 2011-12-04 DIAGNOSIS — I10 Essential (primary) hypertension: Secondary | ICD-10-CM | POA: Diagnosis not present

## 2011-12-04 DIAGNOSIS — M1710 Unilateral primary osteoarthritis, unspecified knee: Secondary | ICD-10-CM | POA: Diagnosis not present

## 2011-12-04 DIAGNOSIS — S81009A Unspecified open wound, unspecified knee, initial encounter: Secondary | ICD-10-CM | POA: Diagnosis not present

## 2011-12-04 DIAGNOSIS — IMO0002 Reserved for concepts with insufficient information to code with codable children: Secondary | ICD-10-CM | POA: Diagnosis not present

## 2011-12-04 DIAGNOSIS — E119 Type 2 diabetes mellitus without complications: Secondary | ICD-10-CM | POA: Diagnosis not present

## 2011-12-04 DIAGNOSIS — S81809A Unspecified open wound, unspecified lower leg, initial encounter: Secondary | ICD-10-CM | POA: Diagnosis not present

## 2011-12-05 DIAGNOSIS — S81009A Unspecified open wound, unspecified knee, initial encounter: Secondary | ICD-10-CM | POA: Diagnosis not present

## 2011-12-05 DIAGNOSIS — IMO0002 Reserved for concepts with insufficient information to code with codable children: Secondary | ICD-10-CM | POA: Diagnosis not present

## 2011-12-05 DIAGNOSIS — W540XXA Bitten by dog, initial encounter: Secondary | ICD-10-CM | POA: Diagnosis not present

## 2011-12-05 DIAGNOSIS — E119 Type 2 diabetes mellitus without complications: Secondary | ICD-10-CM | POA: Diagnosis not present

## 2011-12-05 DIAGNOSIS — M1710 Unilateral primary osteoarthritis, unspecified knee: Secondary | ICD-10-CM | POA: Diagnosis not present

## 2011-12-05 DIAGNOSIS — I10 Essential (primary) hypertension: Secondary | ICD-10-CM | POA: Diagnosis not present

## 2011-12-06 DIAGNOSIS — S81809A Unspecified open wound, unspecified lower leg, initial encounter: Secondary | ICD-10-CM | POA: Diagnosis not present

## 2011-12-06 DIAGNOSIS — S91009A Unspecified open wound, unspecified ankle, initial encounter: Secondary | ICD-10-CM | POA: Diagnosis not present

## 2011-12-06 DIAGNOSIS — S81009A Unspecified open wound, unspecified knee, initial encounter: Secondary | ICD-10-CM | POA: Diagnosis not present

## 2011-12-07 DIAGNOSIS — E7800 Pure hypercholesterolemia, unspecified: Secondary | ICD-10-CM | POA: Diagnosis not present

## 2011-12-08 DIAGNOSIS — S91009A Unspecified open wound, unspecified ankle, initial encounter: Secondary | ICD-10-CM | POA: Diagnosis not present

## 2011-12-08 DIAGNOSIS — M1710 Unilateral primary osteoarthritis, unspecified knee: Secondary | ICD-10-CM | POA: Diagnosis not present

## 2011-12-08 DIAGNOSIS — I10 Essential (primary) hypertension: Secondary | ICD-10-CM | POA: Diagnosis not present

## 2011-12-08 DIAGNOSIS — E119 Type 2 diabetes mellitus without complications: Secondary | ICD-10-CM | POA: Diagnosis not present

## 2011-12-08 DIAGNOSIS — W540XXA Bitten by dog, initial encounter: Secondary | ICD-10-CM | POA: Diagnosis not present

## 2011-12-08 DIAGNOSIS — S81009A Unspecified open wound, unspecified knee, initial encounter: Secondary | ICD-10-CM | POA: Diagnosis not present

## 2011-12-08 DIAGNOSIS — IMO0002 Reserved for concepts with insufficient information to code with codable children: Secondary | ICD-10-CM | POA: Diagnosis not present

## 2011-12-10 DIAGNOSIS — I10 Essential (primary) hypertension: Secondary | ICD-10-CM | POA: Diagnosis not present

## 2011-12-10 DIAGNOSIS — IMO0002 Reserved for concepts with insufficient information to code with codable children: Secondary | ICD-10-CM | POA: Diagnosis not present

## 2011-12-10 DIAGNOSIS — S91009A Unspecified open wound, unspecified ankle, initial encounter: Secondary | ICD-10-CM | POA: Diagnosis not present

## 2011-12-10 DIAGNOSIS — W540XXA Bitten by dog, initial encounter: Secondary | ICD-10-CM | POA: Diagnosis not present

## 2011-12-10 DIAGNOSIS — E119 Type 2 diabetes mellitus without complications: Secondary | ICD-10-CM | POA: Diagnosis not present

## 2011-12-10 DIAGNOSIS — S81009A Unspecified open wound, unspecified knee, initial encounter: Secondary | ICD-10-CM | POA: Diagnosis not present

## 2011-12-10 DIAGNOSIS — M1710 Unilateral primary osteoarthritis, unspecified knee: Secondary | ICD-10-CM | POA: Diagnosis not present

## 2011-12-12 DIAGNOSIS — M1710 Unilateral primary osteoarthritis, unspecified knee: Secondary | ICD-10-CM | POA: Diagnosis not present

## 2011-12-12 DIAGNOSIS — I10 Essential (primary) hypertension: Secondary | ICD-10-CM | POA: Diagnosis not present

## 2011-12-12 DIAGNOSIS — E119 Type 2 diabetes mellitus without complications: Secondary | ICD-10-CM | POA: Diagnosis not present

## 2011-12-12 DIAGNOSIS — S91009A Unspecified open wound, unspecified ankle, initial encounter: Secondary | ICD-10-CM | POA: Diagnosis not present

## 2011-12-12 DIAGNOSIS — IMO0002 Reserved for concepts with insufficient information to code with codable children: Secondary | ICD-10-CM | POA: Diagnosis not present

## 2011-12-12 DIAGNOSIS — W540XXA Bitten by dog, initial encounter: Secondary | ICD-10-CM | POA: Diagnosis not present

## 2011-12-12 DIAGNOSIS — S81009A Unspecified open wound, unspecified knee, initial encounter: Secondary | ICD-10-CM | POA: Diagnosis not present

## 2011-12-13 DIAGNOSIS — S91009A Unspecified open wound, unspecified ankle, initial encounter: Secondary | ICD-10-CM | POA: Diagnosis not present

## 2011-12-13 DIAGNOSIS — S81009A Unspecified open wound, unspecified knee, initial encounter: Secondary | ICD-10-CM | POA: Diagnosis not present

## 2011-12-14 DIAGNOSIS — E785 Hyperlipidemia, unspecified: Secondary | ICD-10-CM | POA: Diagnosis not present

## 2011-12-14 DIAGNOSIS — R7301 Impaired fasting glucose: Secondary | ICD-10-CM | POA: Diagnosis not present

## 2011-12-14 DIAGNOSIS — Z23 Encounter for immunization: Secondary | ICD-10-CM | POA: Diagnosis not present

## 2011-12-14 DIAGNOSIS — I10 Essential (primary) hypertension: Secondary | ICD-10-CM | POA: Diagnosis not present

## 2011-12-14 DIAGNOSIS — R109 Unspecified abdominal pain: Secondary | ICD-10-CM | POA: Diagnosis not present

## 2011-12-15 DIAGNOSIS — M1710 Unilateral primary osteoarthritis, unspecified knee: Secondary | ICD-10-CM | POA: Diagnosis not present

## 2011-12-15 DIAGNOSIS — E119 Type 2 diabetes mellitus without complications: Secondary | ICD-10-CM | POA: Diagnosis not present

## 2011-12-15 DIAGNOSIS — S81009A Unspecified open wound, unspecified knee, initial encounter: Secondary | ICD-10-CM | POA: Diagnosis not present

## 2011-12-15 DIAGNOSIS — IMO0002 Reserved for concepts with insufficient information to code with codable children: Secondary | ICD-10-CM | POA: Diagnosis not present

## 2011-12-15 DIAGNOSIS — W540XXA Bitten by dog, initial encounter: Secondary | ICD-10-CM | POA: Diagnosis not present

## 2011-12-15 DIAGNOSIS — I10 Essential (primary) hypertension: Secondary | ICD-10-CM | POA: Diagnosis not present

## 2011-12-16 DIAGNOSIS — M1710 Unilateral primary osteoarthritis, unspecified knee: Secondary | ICD-10-CM | POA: Diagnosis not present

## 2011-12-16 DIAGNOSIS — W540XXA Bitten by dog, initial encounter: Secondary | ICD-10-CM | POA: Diagnosis not present

## 2011-12-16 DIAGNOSIS — IMO0002 Reserved for concepts with insufficient information to code with codable children: Secondary | ICD-10-CM | POA: Diagnosis not present

## 2011-12-16 DIAGNOSIS — S81009A Unspecified open wound, unspecified knee, initial encounter: Secondary | ICD-10-CM | POA: Diagnosis not present

## 2011-12-16 DIAGNOSIS — E119 Type 2 diabetes mellitus without complications: Secondary | ICD-10-CM | POA: Diagnosis not present

## 2011-12-16 DIAGNOSIS — I10 Essential (primary) hypertension: Secondary | ICD-10-CM | POA: Diagnosis not present

## 2011-12-16 DIAGNOSIS — S91009A Unspecified open wound, unspecified ankle, initial encounter: Secondary | ICD-10-CM | POA: Diagnosis not present

## 2011-12-20 DIAGNOSIS — M1710 Unilateral primary osteoarthritis, unspecified knee: Secondary | ICD-10-CM | POA: Diagnosis not present

## 2011-12-28 DIAGNOSIS — M1710 Unilateral primary osteoarthritis, unspecified knee: Secondary | ICD-10-CM | POA: Diagnosis not present

## 2012-01-05 DIAGNOSIS — M1710 Unilateral primary osteoarthritis, unspecified knee: Secondary | ICD-10-CM | POA: Diagnosis not present

## 2012-01-12 DIAGNOSIS — S91009A Unspecified open wound, unspecified ankle, initial encounter: Secondary | ICD-10-CM | POA: Diagnosis not present

## 2012-01-12 DIAGNOSIS — S81009A Unspecified open wound, unspecified knee, initial encounter: Secondary | ICD-10-CM | POA: Diagnosis not present

## 2012-01-12 DIAGNOSIS — M1710 Unilateral primary osteoarthritis, unspecified knee: Secondary | ICD-10-CM | POA: Diagnosis not present

## 2012-01-18 DIAGNOSIS — M1710 Unilateral primary osteoarthritis, unspecified knee: Secondary | ICD-10-CM | POA: Diagnosis not present

## 2012-01-25 DIAGNOSIS — M1710 Unilateral primary osteoarthritis, unspecified knee: Secondary | ICD-10-CM | POA: Diagnosis not present

## 2012-02-01 DIAGNOSIS — M1710 Unilateral primary osteoarthritis, unspecified knee: Secondary | ICD-10-CM | POA: Diagnosis not present

## 2012-02-08 DIAGNOSIS — L821 Other seborrheic keratosis: Secondary | ICD-10-CM | POA: Diagnosis not present

## 2012-02-08 DIAGNOSIS — L708 Other acne: Secondary | ICD-10-CM | POA: Diagnosis not present

## 2012-02-23 DIAGNOSIS — M1710 Unilateral primary osteoarthritis, unspecified knee: Secondary | ICD-10-CM | POA: Diagnosis not present

## 2012-03-03 DIAGNOSIS — E039 Hypothyroidism, unspecified: Secondary | ICD-10-CM | POA: Diagnosis not present

## 2012-03-03 DIAGNOSIS — I10 Essential (primary) hypertension: Secondary | ICD-10-CM | POA: Diagnosis not present

## 2012-03-03 DIAGNOSIS — E559 Vitamin D deficiency, unspecified: Secondary | ICD-10-CM | POA: Diagnosis not present

## 2012-03-07 DIAGNOSIS — E039 Hypothyroidism, unspecified: Secondary | ICD-10-CM | POA: Diagnosis not present

## 2012-03-07 DIAGNOSIS — M899 Disorder of bone, unspecified: Secondary | ICD-10-CM | POA: Diagnosis not present

## 2012-03-07 DIAGNOSIS — R7301 Impaired fasting glucose: Secondary | ICD-10-CM | POA: Diagnosis not present

## 2012-03-07 DIAGNOSIS — I10 Essential (primary) hypertension: Secondary | ICD-10-CM | POA: Diagnosis not present

## 2012-03-07 DIAGNOSIS — M949 Disorder of cartilage, unspecified: Secondary | ICD-10-CM | POA: Diagnosis not present

## 2012-03-07 DIAGNOSIS — E785 Hyperlipidemia, unspecified: Secondary | ICD-10-CM | POA: Diagnosis not present

## 2012-03-07 DIAGNOSIS — E559 Vitamin D deficiency, unspecified: Secondary | ICD-10-CM | POA: Diagnosis not present

## 2012-03-17 DIAGNOSIS — M1710 Unilateral primary osteoarthritis, unspecified knee: Secondary | ICD-10-CM | POA: Diagnosis not present

## 2012-03-20 DIAGNOSIS — E119 Type 2 diabetes mellitus without complications: Secondary | ICD-10-CM | POA: Diagnosis not present

## 2012-04-18 DIAGNOSIS — L919 Hypertrophic disorder of the skin, unspecified: Secondary | ICD-10-CM | POA: Diagnosis not present

## 2012-04-18 DIAGNOSIS — L909 Atrophic disorder of skin, unspecified: Secondary | ICD-10-CM | POA: Diagnosis not present

## 2012-04-18 DIAGNOSIS — L578 Other skin changes due to chronic exposure to nonionizing radiation: Secondary | ICD-10-CM | POA: Diagnosis not present

## 2012-04-18 DIAGNOSIS — M1710 Unilateral primary osteoarthritis, unspecified knee: Secondary | ICD-10-CM | POA: Diagnosis not present

## 2012-04-27 DIAGNOSIS — K649 Unspecified hemorrhoids: Secondary | ICD-10-CM | POA: Diagnosis not present

## 2012-04-27 DIAGNOSIS — K921 Melena: Secondary | ICD-10-CM | POA: Diagnosis not present

## 2012-04-27 DIAGNOSIS — K5730 Diverticulosis of large intestine without perforation or abscess without bleeding: Secondary | ICD-10-CM | POA: Diagnosis not present

## 2012-04-27 DIAGNOSIS — K625 Hemorrhage of anus and rectum: Secondary | ICD-10-CM | POA: Diagnosis not present

## 2012-05-08 DIAGNOSIS — L820 Inflamed seborrheic keratosis: Secondary | ICD-10-CM | POA: Diagnosis not present

## 2012-05-08 DIAGNOSIS — L723 Sebaceous cyst: Secondary | ICD-10-CM | POA: Diagnosis not present

## 2012-05-16 DIAGNOSIS — M1710 Unilateral primary osteoarthritis, unspecified knee: Secondary | ICD-10-CM | POA: Diagnosis not present

## 2012-05-26 ENCOUNTER — Encounter (HOSPITAL_COMMUNITY): Payer: Self-pay

## 2012-05-26 ENCOUNTER — Emergency Department (HOSPITAL_COMMUNITY)
Admission: EM | Admit: 2012-05-26 | Discharge: 2012-05-27 | Disposition: A | Discharge status: Home / Self Care | Payer: Medicare Other | Attending: Emergency Medicine | Admitting: Emergency Medicine

## 2012-05-26 ENCOUNTER — Emergency Department (HOSPITAL_COMMUNITY): Payer: Medicare Other

## 2012-05-26 DIAGNOSIS — K2970 Gastritis, unspecified, without bleeding: Secondary | ICD-10-CM | POA: Diagnosis not present

## 2012-05-26 DIAGNOSIS — I10 Essential (primary) hypertension: Secondary | ICD-10-CM | POA: Insufficient documentation

## 2012-05-26 DIAGNOSIS — E785 Hyperlipidemia, unspecified: Secondary | ICD-10-CM | POA: Insufficient documentation

## 2012-05-26 DIAGNOSIS — Z79899 Other long term (current) drug therapy: Secondary | ICD-10-CM | POA: Insufficient documentation

## 2012-05-26 DIAGNOSIS — E119 Type 2 diabetes mellitus without complications: Secondary | ICD-10-CM | POA: Diagnosis not present

## 2012-05-26 DIAGNOSIS — K219 Gastro-esophageal reflux disease without esophagitis: Secondary | ICD-10-CM | POA: Diagnosis not present

## 2012-05-26 DIAGNOSIS — K5900 Constipation, unspecified: Secondary | ICD-10-CM | POA: Insufficient documentation

## 2012-05-26 DIAGNOSIS — M129 Arthropathy, unspecified: Secondary | ICD-10-CM | POA: Insufficient documentation

## 2012-05-26 DIAGNOSIS — Z9884 Bariatric surgery status: Secondary | ICD-10-CM | POA: Insufficient documentation

## 2012-05-26 DIAGNOSIS — R109 Unspecified abdominal pain: Secondary | ICD-10-CM | POA: Diagnosis not present

## 2012-05-26 MED ORDER — DIAZEPAM 5 MG/ML IJ SOLN
5.0000 mg | Freq: Once | INTRAMUSCULAR | Status: AC
  Administered 2012-05-26: 5 mg via INTRAMUSCULAR
  Filled 2012-05-26: qty 2

## 2012-05-26 MED ORDER — DIAZEPAM 5 MG PO TABS: 5.0000 mg | ORAL_TABLET | Freq: Four times a day (QID) | ORAL | Status: DC | PRN

## 2012-05-26 MED ORDER — FLEET ENEMA 7-19 GM/118ML RE ENEM: 1.0000 | ENEMA | Freq: Once | RECTAL | Status: DC

## 2012-05-26 MED ORDER — POLYETHYLENE GLYCOL 3350 17 G PO PACK: 17.0000 g | PACK | Freq: Three times a day (TID) | ORAL | Status: DC | PRN

## 2012-05-26 MED ORDER — POLYETHYLENE GLYCOL 3350 17 G PO PACK
17.0000 g | PACK | Freq: Once | ORAL | Status: DC
  Filled 2012-05-26: qty 1

## 2012-05-26 NOTE — ED Provider Notes (Signed)
History     CSN: [id number]  Arrival date & time 05/26/12  1753   First MD Initiated Contact with Patient 05/26/12 1855      Chief Complaint  Patient presents with  . Constipation    (Consider location/radiation/quality/duration/timing/severity/associated sxs/prior treatment) The history is provided by the patient.  Angela [name] is a 79 y.o. female history of hypertension, hyperlipidemia, diabetes, reflux here presenting with constipation. Constipation for the last 2 weeks. He tried MiraLax, milk of magnesia, senna, Colace without any relief. Decreased by mouth intake for the last few days but still passing gas and is not nauseous or vomiting. She also had increased back pain for last 10 days and has been taking Percocet occasionally. She does have previous cholecystectomy and gastric bypass in the past. No history of SBO. [place] by PMD for rule out SBO versus impaction.   Past Medical History  Diagnosis Date  . Hypertension   . Dyslipidemia   . Diabetes mellitus   . Arthritis   . GERD (gastroesophageal reflux disease)     Past Surgical History  Procedure Laterality Date  . Cardiac catheterization      NORMAL CORONARY ARTERIES  . Mastectomy      LEFT BREAST  . Cholecystectomy    . Tonsillectomy      Family History  Problem Relation Age of Onset  . Heart attack Maternal Grandmother   . Cancer      BLADDER  . Pancreatic cancer    . Lung cancer    . Cancer Mother   . Cancer Father   . Ovarian cancer Sister   . Hypertension Sister   . Lung cancer Brother     History  Substance Use Topics  . Smoking status: Never Smoker   . Smokeless tobacco: Not on file  . Alcohol Use: No    OB History   Grav Para Term Preterm Abortions TAB SAB Ect Mult Living                  Review of Systems  Gastrointestinal: Positive for constipation.  All other systems reviewed and are negative.    Allergies  Crestor; Gadolinium; and Penicillins  Home Medications    Current Outpatient Rx  Name  Route  Sig  Dispense  Refill  . benazepril-hydrochlorthiazide (LOTENSIN HCT) 20-25 MG per tablet   Oral   Take 1 tablet by mouth daily. 1/2 DAILY          . ergocalciferol (VITAMIN D2) 50000 UNITS capsule   Oral   Take 50,000 Units by mouth once a week. Monday         . Glucosamine 500 MG CAPS   Oral   Take 1 capsule by mouth daily.         . isosorbide mononitrate (IMDUR) 30 MG 24 hr tablet   Oral   Take 1 tablet (30 mg total) by mouth daily.   30 tablet   0   . levothyroxine (SYNTHROID, LEVOTHROID) 112 MCG tablet   Oral   Take 100 mcg by mouth daily.          . magnesium hydroxide (MILK OF MAGNESIA) 400 MG/5ML suspension   Oral   Take 5 mLs by mouth daily as needed for constipation.         . meloxicam (MOBIC) 15 MG tablet   Oral   Take 15 mg by mouth daily.           Marland Kitchen oxyCODONE-acetaminophen (PERCOCET) 5-325  MG per tablet   Oral   Take 1 tablet by mouth every 4 (four) hours as needed. Pain         . pantoprazole (PROTONIX) 40 MG tablet   Oral   Take 40 mg by mouth daily.           . polyethylene glycol (MIRALAX / GLYCOLAX) packet   Oral   Take 17 g by mouth daily.         . simvastatin (ZOCOR) 40 MG tablet   Oral   Take 40 mg by mouth at bedtime.           . diazepam (VALIUM) 5 MG tablet   Oral   Take 1 tablet (5 mg total) by mouth every 6 (six) hours as needed for anxiety (spasms).   10 tablet   0     BP 124/68  Pulse 84  Temp(Src) 98.7 F (37.1 C) (Oral)  Resp 20  SpO2 96%  Physical Exam  Nursing note and vitals reviewed. Constitutional: She is oriented to person, place, and time. She appears well-developed and well-nourished.  HENT:  Head: Normocephalic.  Mouth/Throat: Oropharynx is clear and moist.  Eyes: Conjunctivae are normal. Pupils are equal, round, and reactive to light.  Neck: Normal range of motion. Neck supple.  Cardiovascular: Normal rate, regular rhythm and normal heart  sounds.   Pulmonary/Chest: Effort normal and breath sounds normal. No respiratory distress. She has no wheezes. She has no rales.  Abdominal: Bowel sounds are normal.  Abdomen firm, distended, mild diffuse tenderness. Rectal- no hemorrhoids, not impacted.   Musculoskeletal: Normal range of motion. She exhibits no edema and no tenderness.  Neurological: She is alert and oriented to person, place, and time.  Skin: Skin is warm and dry.  Psychiatric: She has a normal mood and affect. Her behavior is normal. Judgment and thought content normal.    ED Course  Procedures (including critical care time)  Labs Reviewed - No data to display Dg Abd Acute W/chest  05/26/2012  *RADIOLOGY REPORT*  Clinical Data: Abdominal pain, constipation  ACUTE ABDOMEN SERIES (ABDOMEN 2 VIEW & CHEST 1 VIEW)  Comparison: Chest radiographs dated 08/11/2010  Findings: Lungs are essentially clear.  No pleural effusion or pneumothorax.  The heart is normal in size.  Nonobstructive bowel gas pattern.  No evidence of free air under the diaphragm on the upright view.  Normal colonic stool burden.  Surgical sutures in the right mid abdomen.  IMPRESSION: No evidence of acute cardiopulmonary disease.  No evidence of small bowel obstruction or free air.   Original Report Authenticated By: Charline Bills, M.D.      1. Constipation       MDM  Angela [name] is a 79 y.o. female hx of abdominal surgery here with constipation. Will need to r/o SBO. Will get xrays and reassess.   11:28 PM Xray showed no SBO. She was given enema, had bowel movement. Will d/c home on miralax until bowel movement at home.        Richardean Canal, MD 05/26/12 2329

## 2012-05-26 NOTE — ED Notes (Signed)
Pt c/o constipation x 5 days.  Sts last bowel movement was after a suppository.  Sts last "natural" BM was 11 days ago.  Sts she started taking "pieces" of Percocets x 10 days ago for back pain.  Sts she has tried Milk of Magnesia, Senokot, Colace and Miralax, with no relief.  Sts she hasnt eaten in 3 days.

## 2012-05-26 NOTE — ED Notes (Signed)
Per EMS pt c/o constipation [phone] with abd pain, states had a colonoscopy 10/13 no problems since. Pt sent here per PCP for possible impaction

## 2012-05-26 NOTE — ED Notes (Signed)
BJY:[id number]<FA> Expected date:<BR> Expected time:<BR> Means of arrival:<BR> Comments:<BR> constipation

## 2012-05-27 DIAGNOSIS — K5900 Constipation, unspecified: Secondary | ICD-10-CM | POA: Diagnosis not present

## 2012-05-27 DIAGNOSIS — R109 Unspecified abdominal pain: Secondary | ICD-10-CM | POA: Diagnosis not present

## 2012-05-27 NOTE — ED Notes (Signed)
Patient updated on delay with transportation services.

## 2012-05-27 NOTE — ED Notes (Signed)
Patient is alert and oriented x3.  She was given DC instructions and follow up visit instructions.  Patient gave verbal understanding. She was DC ambulatory under her own power to home.  V/S stable.  He was not showing any signs of distress on DC 

## 2012-05-27 NOTE — ED Notes (Signed)
Patient updated on delay with transport services.

## 2012-05-29 ENCOUNTER — Other Ambulatory Visit: Payer: Self-pay | Admitting: Orthopedic Surgery

## 2012-05-29 DIAGNOSIS — M545 Low back pain, unspecified: Secondary | ICD-10-CM | POA: Diagnosis not present

## 2012-05-29 DIAGNOSIS — IMO0002 Reserved for concepts with insufficient information to code with codable children: Secondary | ICD-10-CM

## 2012-05-29 DIAGNOSIS — S22009A Unspecified fracture of unspecified thoracic vertebra, initial encounter for closed fracture: Secondary | ICD-10-CM | POA: Diagnosis not present

## 2012-05-30 ENCOUNTER — Ambulatory Visit
Admission: RE | Admit: 2012-05-30 | Discharge: 2012-05-30 | Disposition: A | Discharge status: Home / Self Care | Payer: Medicare Other | Source: Ambulatory Visit | Attending: Orthopedic Surgery | Admitting: Orthopedic Surgery

## 2012-05-30 ENCOUNTER — Other Ambulatory Visit: Payer: Self-pay | Admitting: Orthopedic Surgery

## 2012-05-30 DIAGNOSIS — M545 Low back pain, unspecified: Secondary | ICD-10-CM | POA: Diagnosis not present

## 2012-05-30 DIAGNOSIS — S22009A Unspecified fracture of unspecified thoracic vertebra, initial encounter for closed fracture: Secondary | ICD-10-CM | POA: Diagnosis not present

## 2012-05-30 DIAGNOSIS — IMO0002 Reserved for concepts with insufficient information to code with codable children: Secondary | ICD-10-CM

## 2012-05-30 DIAGNOSIS — M8448XA Pathological fracture, other site, initial encounter for fracture: Secondary | ICD-10-CM | POA: Diagnosis not present

## 2012-05-30 DIAGNOSIS — M546 Pain in thoracic spine: Secondary | ICD-10-CM | POA: Diagnosis not present

## 2012-05-31 ENCOUNTER — Ambulatory Visit
Admission: RE | Admit: 2012-05-31 | Discharge: 2012-05-31 | Disposition: A | Discharge status: Home / Self Care | Payer: Medicare Other | Source: Ambulatory Visit | Attending: Orthopedic Surgery | Admitting: Orthopedic Surgery

## 2012-05-31 ENCOUNTER — Other Ambulatory Visit: Payer: Self-pay | Admitting: Orthopedic Surgery

## 2012-05-31 ENCOUNTER — Other Ambulatory Visit (HOSPITAL_COMMUNITY): Payer: Self-pay | Admitting: Interventional Radiology

## 2012-05-31 VITALS — BP 98/62 | HR 83 | Temp 97.6°F | Resp 13

## 2012-05-31 DIAGNOSIS — IMO0002 Reserved for concepts with insufficient information to code with codable children: Secondary | ICD-10-CM

## 2012-05-31 DIAGNOSIS — M8448XA Pathological fracture, other site, initial encounter for fracture: Secondary | ICD-10-CM | POA: Diagnosis not present

## 2012-05-31 DIAGNOSIS — D480 Neoplasm of uncertain behavior of bone and articular cartilage: Secondary | ICD-10-CM | POA: Diagnosis not present

## 2012-05-31 MED ORDER — VANCOMYCIN HCL IN DEXTROSE 1-5 GM/200ML-% IV SOLN
1000.0000 mg | Freq: Once | INTRAVENOUS | Status: AC
  Administered 2012-05-31: 1.5 mg via INTRAVENOUS

## 2012-05-31 MED ORDER — KETOROLAC TROMETHAMINE 30 MG/ML IJ SOLN
30.0000 mg | Freq: Once | INTRAMUSCULAR | Status: AC
  Administered 2012-05-31: 30 mg via INTRAVENOUS

## 2012-05-31 MED ORDER — DIAZEPAM 5 MG PO TABS: 5.0000 mg | ORAL_TABLET | Freq: Once | ORAL | Status: DC | PRN

## 2012-05-31 MED ORDER — FENTANYL CITRATE 0.05 MG/ML IJ SOLN
25.0000 ug | INTRAMUSCULAR | Status: DC | PRN
  Administered 2012-05-31 (×2): 50 ug via INTRAVENOUS

## 2012-05-31 MED ORDER — MIDAZOLAM HCL 2 MG/2ML IJ SOLN
1.0000 mg | INTRAMUSCULAR | Status: DC | PRN
  Administered 2012-05-31 (×2): 1 mg via INTRAVENOUS

## 2012-05-31 MED ORDER — SODIUM CHLORIDE 0.9 % IV SOLN
Freq: Once | INTRAVENOUS | Status: AC
  Administered 2012-05-31: 10:00:00 via INTRAVENOUS

## 2012-05-31 NOTE — Discharge Instructions (Signed)
Vertebroplasty Post Procedure Discharge Instructions  1. May resume a regular diet and any medications that you routinely take (including pain medications). 2. No driving day of procedure. 3. Upon discharge go home and rest for at least 4 hours.  May use an ice pack as needed to injection sites on back. 4. May change dressing after shower tomorrow. Replace with clean bandaides, change daily until healed. 5. Follow up with primary care physician re: need for bone therapy.  6.   Any questions or concerns feel free to call [phone].    Please contact our office at [phone] for the following symptoms:   Fever greater than 100 degrees  Increased swelling, pain, or redness at injection site.   Thank you for visiting [facility].

## 2012-05-31 NOTE — Progress Notes (Signed)
Sedation time during VP was 25 minutes.  Patient resting quietly and without complaint in nursing station after procedure.  Her friend, Andrey Campanile, notified of 1230 discharge time.  She stated she is on her way back over here.  jkl

## 2012-06-12 DIAGNOSIS — S22009A Unspecified fracture of unspecified thoracic vertebra, initial encounter for closed fracture: Secondary | ICD-10-CM | POA: Diagnosis not present

## 2012-06-12 DIAGNOSIS — M1710 Unilateral primary osteoarthritis, unspecified knee: Secondary | ICD-10-CM | POA: Diagnosis not present

## 2012-07-11 DIAGNOSIS — M25569 Pain in unspecified knee: Secondary | ICD-10-CM | POA: Diagnosis not present

## 2012-07-12 DIAGNOSIS — M8448XA Pathological fracture, other site, initial encounter for fracture: Secondary | ICD-10-CM | POA: Diagnosis not present

## 2012-07-25 DIAGNOSIS — L820 Inflamed seborrheic keratosis: Secondary | ICD-10-CM | POA: Diagnosis not present

## 2012-07-25 DIAGNOSIS — L723 Sebaceous cyst: Secondary | ICD-10-CM | POA: Diagnosis not present

## 2012-07-25 DIAGNOSIS — L821 Other seborrheic keratosis: Secondary | ICD-10-CM | POA: Diagnosis not present

## 2012-07-26 DIAGNOSIS — M949 Disorder of cartilage, unspecified: Secondary | ICD-10-CM | POA: Diagnosis not present

## 2012-07-26 DIAGNOSIS — R7301 Impaired fasting glucose: Secondary | ICD-10-CM | POA: Diagnosis not present

## 2012-07-26 DIAGNOSIS — E785 Hyperlipidemia, unspecified: Secondary | ICD-10-CM | POA: Diagnosis not present

## 2012-07-26 DIAGNOSIS — E559 Vitamin D deficiency, unspecified: Secondary | ICD-10-CM | POA: Diagnosis not present

## 2012-07-26 DIAGNOSIS — E039 Hypothyroidism, unspecified: Secondary | ICD-10-CM | POA: Diagnosis not present

## 2012-07-26 DIAGNOSIS — M899 Disorder of bone, unspecified: Secondary | ICD-10-CM | POA: Diagnosis not present

## 2012-08-02 DIAGNOSIS — M949 Disorder of cartilage, unspecified: Secondary | ICD-10-CM | POA: Diagnosis not present

## 2012-08-02 DIAGNOSIS — I10 Essential (primary) hypertension: Secondary | ICD-10-CM | POA: Diagnosis not present

## 2012-08-02 DIAGNOSIS — E785 Hyperlipidemia, unspecified: Secondary | ICD-10-CM | POA: Diagnosis not present

## 2012-08-02 DIAGNOSIS — R7301 Impaired fasting glucose: Secondary | ICD-10-CM | POA: Diagnosis not present

## 2012-08-02 DIAGNOSIS — M899 Disorder of bone, unspecified: Secondary | ICD-10-CM | POA: Diagnosis not present

## 2012-08-04 ENCOUNTER — Encounter: Payer: Self-pay | Admitting: Cardiology

## 2012-08-04 DIAGNOSIS — R7301 Impaired fasting glucose: Secondary | ICD-10-CM | POA: Diagnosis not present

## 2012-08-08 DIAGNOSIS — M1710 Unilateral primary osteoarthritis, unspecified knee: Secondary | ICD-10-CM | POA: Diagnosis not present

## 2012-08-15 DIAGNOSIS — M1710 Unilateral primary osteoarthritis, unspecified knee: Secondary | ICD-10-CM | POA: Diagnosis not present

## 2012-08-22 DIAGNOSIS — M1710 Unilateral primary osteoarthritis, unspecified knee: Secondary | ICD-10-CM | POA: Diagnosis not present

## 2012-08-29 ENCOUNTER — Other Ambulatory Visit: Payer: Self-pay | Admitting: Gastroenterology

## 2012-08-29 DIAGNOSIS — R112 Nausea with vomiting, unspecified: Secondary | ICD-10-CM

## 2012-08-29 DIAGNOSIS — R110 Nausea: Secondary | ICD-10-CM | POA: Diagnosis not present

## 2012-09-01 ENCOUNTER — Encounter: Payer: Self-pay | Admitting: Cardiology

## 2012-09-01 ENCOUNTER — Ambulatory Visit
Admission: RE | Admit: 2012-09-01 | Discharge: 2012-09-01 | Disposition: A | Discharge status: Home / Self Care | Payer: Medicare Other | Source: Ambulatory Visit | Attending: Gastroenterology | Admitting: Gastroenterology

## 2012-09-01 ENCOUNTER — Ambulatory Visit (INDEPENDENT_AMBULATORY_CARE_PROVIDER_SITE_OTHER): Payer: Medicare Other | Admitting: Cardiology

## 2012-09-01 VITALS — BP 142/94 | HR 90 | Ht 68.0 in | Wt 215.8 lb

## 2012-09-01 DIAGNOSIS — K7689 Other specified diseases of liver: Secondary | ICD-10-CM | POA: Diagnosis not present

## 2012-09-01 DIAGNOSIS — E785 Hyperlipidemia, unspecified: Secondary | ICD-10-CM | POA: Diagnosis not present

## 2012-09-01 DIAGNOSIS — R112 Nausea with vomiting, unspecified: Secondary | ICD-10-CM

## 2012-09-01 DIAGNOSIS — R011 Cardiac murmur, unspecified: Secondary | ICD-10-CM | POA: Insufficient documentation

## 2012-09-01 NOTE — Patient Instructions (Signed)
We will schedule you for an echocardiogram.

## 2012-09-01 NOTE — Progress Notes (Signed)
[name] Gallant Date of Birth: 1933-01-29 Medical Record #[id number]  History of Present Illness: [name] is referred by Dr. Lucianne Muss for evaluation of abnormal heart sounds. She is a pleasant but anxious 79 year old white female who states that recently she's been having a lot of difficulty with stomach pains. Her metformin was discontinued and she was seen by Dr. Kinnie Scales this past Tuesday. On his exam he felt that she had a pericardial rub. Her lab work demonstrated elevated white count. Patient denies any symptoms of chest pain pleuritic or otherwise. She's had no fever or chills. She denies any shortness of breath. She does complain of a lack of energy. She is scheduled for an abdominal ultrasound today to evaluate her abdominal complaints. She reports prior cardiac evaluation with an echocardiogram and cardiac catheterization about 6 years ago. Her cardiac catheterization was normal. Echocardiogram was also unremarkable.  Current Outpatient Prescriptions on File Prior to Visit  Medication Sig Dispense Refill  . benazepril-hydrochlorthiazide (LOTENSIN HCT) 20-25 MG per tablet Take 1 tablet by mouth daily. 1/2 DAILY       . ergocalciferol (VITAMIN D2) 50000 UNITS capsule Take 50,000 Units by mouth once a week. Monday      . isosorbide mononitrate (IMDUR) 30 MG 24 hr tablet Take 1 tablet (30 mg total) by mouth daily.  30 tablet  0  . levothyroxine (SYNTHROID, LEVOTHROID) 112 MCG tablet Take 100 mcg by mouth daily.       . meloxicam (MOBIC) 15 MG tablet Take 15 mg by mouth daily.        Marland Kitchen oxyCODONE-acetaminophen (PERCOCET) 5-325 MG per tablet Take 1 tablet by mouth every 4 (four) hours as needed. Pain      . pantoprazole (PROTONIX) 40 MG tablet Take 40 mg by mouth daily.        . polyethylene glycol (MIRALAX / GLYCOLAX) packet Take 17 g by mouth 3 (three) times daily as needed.  30 each  0   No current facility-administered medications on file prior to visit.    Allergies  Allergen  Reactions  . Crestor (Rosuvastatin Calcium) Anaphylaxis    Mouth swelling  . Gadolinium      Desc: pt states she had nausea after receiving MAGNEVIST for breast imaging   . Penicillins     Past Medical History  Diagnosis Date  . Hypertension   . Dyslipidemia   . Diabetes mellitus   . Arthritis   . GERD (gastroesophageal reflux disease)   . Heart murmur     Past Surgical History  Procedure Laterality Date  . Cardiac catheterization      NORMAL CORONARY ARTERIES  . Mastectomy      LEFT BREAST  . Cholecystectomy    . Tonsillectomy      History  Smoking status  . Never Smoker   Smokeless tobacco  . Not on file    History  Alcohol Use No    Family History  Problem Relation Age of Onset  . Heart attack Maternal Grandmother   . Cancer      BLADDER  . Pancreatic cancer    . Lung cancer    . Cancer Mother   . Cancer Father   . Ovarian cancer Sister   . Hypertension Sister   . Lung cancer Brother     Review of Systems: The review of systems is positive for chronic are still arthritis in her knees. She has had multiple injections. She's had previous vertebral fracture with vertebroplasty. She  reports a fecal impaction in March.  All other systems were reviewed and are negative.  Physical Exam: BP 142/94  Pulse 90  Ht 5\' 8"  (1.727 m)  Wt 215 lb 12.8 oz (97.886 kg)  BMI 32.82 kg/m2 She is a pleasant, overweight white female in no acute distress. HEENT: Normocephalic, atraumatic. Pupils are equal and reactive to light accommodation. Sclera are clear. Oropharynx is clear. Neck is supple no JVD, adenopathy, thyromegaly, or bruits. Lungs: Clear Cardiovascular: Regular rate and rhythm. Normal S1 and S2. There is a grade 2/6 systolic ejection murmur the right upper sternal border. There is no rub. No gallop. Abdomen: Soft and nontender. No masses or bruits. Extremities: No cyanosis or edema. She has chronic venous varicosities that are superficial. Pedal pulses are  2+. Skin: Warm and dry Neuro: Alert and oriented x3. Cranial nerves II through XII are intact. LABORATORY DATA: ECG today demonstrates normal sinus rhythm with left axis deviation. It is otherwise normal. Laboratory data from 08/04/2012 shows a normal urinalysis. A1c is 5.4%. White count was 13,900. Other blood counts were normal. Thyroid functions were normal. Complete chemistry panel was normal. Total cholesterol 195, triglycerides 76, HDL 71, LDL 113.  Assessment / Plan: 1. Heart murmur. Her exam to me is consistent with an outflow tract murmur. I hear no evidence of a rub. She has no symptoms of pericarditis. In fact she is asymptomatic from a cardiac standpoint. We will obtain an echocardiogram to further evaluate her murmur. If this is unremarkable we will reassure her.

## 2012-09-05 DIAGNOSIS — M25569 Pain in unspecified knee: Secondary | ICD-10-CM | POA: Diagnosis not present

## 2012-09-11 ENCOUNTER — Ambulatory Visit (HOSPITAL_COMMUNITY): Payer: Medicare Other | Attending: Cardiology | Admitting: Radiology

## 2012-09-11 DIAGNOSIS — R011 Cardiac murmur, unspecified: Secondary | ICD-10-CM | POA: Diagnosis not present

## 2012-09-11 DIAGNOSIS — E785 Hyperlipidemia, unspecified: Secondary | ICD-10-CM

## 2012-09-11 DIAGNOSIS — E669 Obesity, unspecified: Secondary | ICD-10-CM | POA: Diagnosis not present

## 2012-09-11 DIAGNOSIS — C50919 Malignant neoplasm of unspecified site of unspecified female breast: Secondary | ICD-10-CM | POA: Insufficient documentation

## 2012-09-11 DIAGNOSIS — E119 Type 2 diabetes mellitus without complications: Secondary | ICD-10-CM | POA: Diagnosis not present

## 2012-09-11 DIAGNOSIS — I10 Essential (primary) hypertension: Secondary | ICD-10-CM | POA: Insufficient documentation

## 2012-09-11 NOTE — Progress Notes (Signed)
Echocardiogram performed.  

## 2012-09-12 ENCOUNTER — Other Ambulatory Visit: Payer: Self-pay | Admitting: Dermatology

## 2012-09-12 DIAGNOSIS — L723 Sebaceous cyst: Secondary | ICD-10-CM | POA: Diagnosis not present

## 2012-09-12 DIAGNOSIS — D485 Neoplasm of uncertain behavior of skin: Secondary | ICD-10-CM | POA: Diagnosis not present

## 2012-09-12 DIAGNOSIS — D239 Other benign neoplasm of skin, unspecified: Secondary | ICD-10-CM | POA: Diagnosis not present

## 2012-09-12 DIAGNOSIS — L905 Scar conditions and fibrosis of skin: Secondary | ICD-10-CM | POA: Diagnosis not present

## 2012-09-15 DIAGNOSIS — R112 Nausea with vomiting, unspecified: Secondary | ICD-10-CM | POA: Diagnosis not present

## 2012-09-18 ENCOUNTER — Ambulatory Visit: Payer: Medicare Other | Admitting: Nurse Practitioner

## 2012-09-27 ENCOUNTER — Other Ambulatory Visit: Payer: Self-pay | Admitting: *Deleted

## 2012-09-27 DIAGNOSIS — R079 Chest pain, unspecified: Secondary | ICD-10-CM

## 2012-09-27 MED ORDER — ISOSORBIDE MONONITRATE ER 30 MG PO TB24: 30.0000 mg | ORAL_TABLET | Freq: Every day | ORAL | Status: DC

## 2012-10-03 DIAGNOSIS — H2510 Age-related nuclear cataract, unspecified eye: Secondary | ICD-10-CM | POA: Diagnosis not present

## 2012-10-03 DIAGNOSIS — H3530 Unspecified macular degeneration: Secondary | ICD-10-CM | POA: Diagnosis not present

## 2012-10-04 DIAGNOSIS — M25569 Pain in unspecified knee: Secondary | ICD-10-CM | POA: Diagnosis not present

## 2012-10-10 DIAGNOSIS — Z8049 Family history of malignant neoplasm of other genital organs: Secondary | ICD-10-CM | POA: Diagnosis not present

## 2012-10-10 DIAGNOSIS — Z130 Encounter for screening for diseases of the blood and blood-forming organs and certain disorders involving the immune mechanism: Secondary | ICD-10-CM | POA: Diagnosis not present

## 2012-10-10 DIAGNOSIS — Z124 Encounter for screening for malignant neoplasm of cervix: Secondary | ICD-10-CM | POA: Diagnosis not present

## 2012-10-10 DIAGNOSIS — Z01419 Encounter for gynecological examination (general) (routine) without abnormal findings: Secondary | ICD-10-CM | POA: Diagnosis not present

## 2012-10-11 ENCOUNTER — Other Ambulatory Visit: Payer: Self-pay | Admitting: *Deleted

## 2012-10-11 MED ORDER — LEVOTHYROXINE SODIUM 100 MCG PO TABS: ORAL_TABLET | ORAL | Status: DC

## 2012-10-16 ENCOUNTER — Telehealth: Payer: Self-pay | Admitting: Genetic Counselor

## 2012-10-16 NOTE — Telephone Encounter (Signed)
S/W PT IN RE GENETIC APPT 09/29 @ 2 Angela [name].  WELCOME PACKET MAILED.

## 2012-10-24 DIAGNOSIS — M25569 Pain in unspecified knee: Secondary | ICD-10-CM | POA: Diagnosis not present

## 2012-10-26 DIAGNOSIS — H2510 Age-related nuclear cataract, unspecified eye: Secondary | ICD-10-CM | POA: Diagnosis not present

## 2012-10-26 DIAGNOSIS — H2589 Other age-related cataract: Secondary | ICD-10-CM | POA: Diagnosis not present

## 2012-10-26 DIAGNOSIS — H25019 Cortical age-related cataract, unspecified eye: Secondary | ICD-10-CM | POA: Diagnosis not present

## 2012-10-27 ENCOUNTER — Other Ambulatory Visit: Payer: Self-pay | Admitting: *Deleted

## 2012-10-27 DIAGNOSIS — R079 Chest pain, unspecified: Secondary | ICD-10-CM

## 2012-10-27 MED ORDER — ISOSORBIDE MONONITRATE ER 30 MG PO TB24: 30.0000 mg | ORAL_TABLET | Freq: Every day | ORAL | Status: DC

## 2012-11-09 DIAGNOSIS — H2510 Age-related nuclear cataract, unspecified eye: Secondary | ICD-10-CM | POA: Diagnosis not present

## 2012-11-09 DIAGNOSIS — H25019 Cortical age-related cataract, unspecified eye: Secondary | ICD-10-CM | POA: Diagnosis not present

## 2012-11-09 DIAGNOSIS — H2589 Other age-related cataract: Secondary | ICD-10-CM | POA: Diagnosis not present

## 2012-11-21 ENCOUNTER — Other Ambulatory Visit: Payer: Self-pay | Admitting: *Deleted

## 2012-11-21 MED ORDER — BENAZEPRIL-HYDROCHLOROTHIAZIDE 20-12.5 MG PO TABS: 1.0000 | ORAL_TABLET | Freq: Every day | ORAL | Status: DC

## 2012-11-22 DIAGNOSIS — M25569 Pain in unspecified knee: Secondary | ICD-10-CM | POA: Diagnosis not present

## 2012-12-04 ENCOUNTER — Other Ambulatory Visit: Payer: Self-pay | Admitting: Endocrinology

## 2012-12-04 ENCOUNTER — Other Ambulatory Visit: Payer: Self-pay | Admitting: *Deleted

## 2012-12-04 DIAGNOSIS — E119 Type 2 diabetes mellitus without complications: Secondary | ICD-10-CM

## 2012-12-04 DIAGNOSIS — E039 Hypothyroidism, unspecified: Secondary | ICD-10-CM | POA: Insufficient documentation

## 2012-12-04 DIAGNOSIS — E785 Hyperlipidemia, unspecified: Secondary | ICD-10-CM

## 2012-12-05 ENCOUNTER — Other Ambulatory Visit: Payer: Medicare Other

## 2012-12-05 ENCOUNTER — Other Ambulatory Visit (INDEPENDENT_AMBULATORY_CARE_PROVIDER_SITE_OTHER): Payer: Medicare Other

## 2012-12-05 DIAGNOSIS — E785 Hyperlipidemia, unspecified: Secondary | ICD-10-CM

## 2012-12-05 DIAGNOSIS — E039 Hypothyroidism, unspecified: Secondary | ICD-10-CM | POA: Diagnosis not present

## 2012-12-05 DIAGNOSIS — E119 Type 2 diabetes mellitus without complications: Secondary | ICD-10-CM

## 2012-12-05 LAB — MICROALBUMIN / CREATININE URINE RATIO
Microalb Creat Ratio: 0.2 mg/g (ref 0.0–30.0)
Microalb, Ur: 0.4 mg/dL (ref 0.0–1.9)

## 2012-12-05 LAB — COMPREHENSIVE METABOLIC PANEL
ALT: 14 U/L (ref 0–35)
AST: 14 U/L (ref 0–37)
BUN: 12 mg/dL (ref 6–23)
CO2: 34 mEq/L — ABNORMAL HIGH (ref 19–32)
Creatinine, Ser: 0.6 mg/dL (ref 0.4–1.2)
GFR: 104.64 mL/min (ref >60.00)
Total Bilirubin: 1 mg/dL (ref 0.3–1.2)

## 2012-12-05 LAB — TSH: TSH: 3.49 u[IU]/mL (ref 0.35–5.50)

## 2012-12-05 LAB — URINALYSIS, ROUTINE W REFLEX MICROSCOPIC
Hgb urine dipstick: NEGATIVE
Nitrite: NEGATIVE
Total Protein, Urine: NEGATIVE

## 2012-12-05 LAB — HEMOGLOBIN A1C: Hgb A1c MFr Bld: 5.9 % (ref 4.6–6.5)

## 2012-12-05 LAB — LDL CHOLESTEROL, DIRECT: Direct LDL: 201.3 mg/dL

## 2012-12-05 LAB — LIPID PANEL: Total CHOL/HDL Ratio: 5

## 2012-12-11 ENCOUNTER — Encounter: Payer: Self-pay | Admitting: Endocrinology

## 2012-12-11 ENCOUNTER — Ambulatory Visit (INDEPENDENT_AMBULATORY_CARE_PROVIDER_SITE_OTHER): Payer: Medicare Other | Admitting: Endocrinology

## 2012-12-11 VITALS — BP 110/60 | HR 87 | Temp 99.0°F

## 2012-12-11 DIAGNOSIS — I10 Essential (primary) hypertension: Secondary | ICD-10-CM

## 2012-12-11 DIAGNOSIS — D72829 Elevated white blood cell count, unspecified: Secondary | ICD-10-CM | POA: Diagnosis not present

## 2012-12-11 DIAGNOSIS — E119 Type 2 diabetes mellitus without complications: Secondary | ICD-10-CM

## 2012-12-11 DIAGNOSIS — Z23 Encounter for immunization: Secondary | ICD-10-CM | POA: Diagnosis not present

## 2012-12-11 DIAGNOSIS — E785 Hyperlipidemia, unspecified: Secondary | ICD-10-CM | POA: Diagnosis not present

## 2012-12-11 DIAGNOSIS — E039 Hypothyroidism, unspecified: Secondary | ICD-10-CM

## 2012-12-11 LAB — CBC WITH DIFFERENTIAL/PLATELET
Basophils Absolute: 0 10*3/uL (ref 0.0–0.1)
Eosinophils Absolute: 0.1 10*3/uL (ref 0.0–0.7)
HCT: 42.7 % (ref 36.0–46.0)
Hemoglobin: 14.6 g/dL (ref 12.0–15.0)
Lymphs Abs: 3.3 10*3/uL (ref 0.7–4.0)
MCHC: 34.2 g/dL (ref 30.0–36.0)
MCV: 89.1 fl (ref 78.0–100.0)
Monocytes Absolute: 1 10*3/uL (ref 0.1–1.0)
Monocytes Relative: 8.8 % (ref 3.0–12.0)
Neutro Abs: 6.8 10*3/uL (ref 1.4–7.7)
Platelets: 231 10*3/uL (ref 150.0–400.0)
RDW: 13.9 % (ref 11.5–14.6)

## 2012-12-11 NOTE — Patient Instructions (Addendum)
Take Crestor 5mg  after supper daily  Check BP regularly

## 2012-12-11 NOTE — Progress Notes (Signed)
Patient ID: Earney Navy, female   DOB: [birth year], 79 y.o.   MRN: [id number]  Angela [name] Angela [name] is a 79 y.o. female.   Chief complaint: Followup for various problems  History of Present Illness:  1. Primary hypothyroidism: She has had long-standing primary hypothyroidism. Her dose has been periodically adjusted. She thinks that her energy level is fairly good now. She is quite compliant with taking her medication in the morning and her TSH is normal again 2. She apparently had some abdominal pain and nausea during spring this year and etiology not clear. This resolved and gastroenterologist did not do an endoscopy, had normal abdominal ultrasound. Her metformin was stopped 3. Prediabetes: This was diagnosed in 2008 with fasting glucose of 117 and a two-hour glucose of 177. She had been on metformin but not recently. She has lost weight this year but has gained back some. She does not allow her weight to be taken 4. Hypertension: She has had long-standing hypertension which has been well controlled usually. Has not monitored her BP on her own. No lightheadedness 5. HYPERLIPIDEMIA: She was told by the gastroenterologist to stop her Crestor because of fatty liver. However review of her liver functions did not show any abnormality and her LDL is higher than usual. She has been intolerant to other statins. Discussed that Zetia will not provide cardiovascular protection as much a statin especially with her LDL over 200. Her last LDL in 5/14 was 113     Medication List       This list is accurate as of: 12/11/12  3:23 PM.  Always use your most recent med list.               benazepril-hydrochlorthiazide 20-12.5 MG per tablet  Commonly known as:  LOTENSIN HCT  Take 1 tablet by mouth daily.     ergocalciferol 50000 UNITS capsule  Commonly known as:  VITAMIN D2  Take 50,000 Units by mouth once a week. Monday     isosorbide mononitrate 30 MG 24 hr tablet  Commonly known as:  IMDUR  Take  1 tablet (30 mg total) by mouth daily.     levothyroxine 100 MCG tablet  Commonly known as:  SYNTHROID, LEVOTHROID  Take 1 tablet by mouth 6 days a week     meloxicam 15 MG tablet  Commonly known as:  MOBIC  Take 15 mg by mouth daily.     oxyCODONE-acetaminophen 5-325 MG per tablet  Commonly known as:  PERCOCET/ROXICET  Take 1 tablet by mouth every 4 (four) hours as needed. Pain     pantoprazole 40 MG tablet  Commonly known as:  PROTONIX  Take 40 mg by mouth daily.     polyethylene glycol packet  Commonly known as:  MIRALAX / GLYCOLAX  Take 17 g by mouth 3 (three) times daily as needed.     rosuvastatin 5 MG tablet  Commonly known as:  CRESTOR  Take 5 mg by mouth daily.        Allergies:  Allergies  Allergen Reactions  . Crestor [Rosuvastatin Calcium] Anaphylaxis    Mouth swelling  . Gadolinium      Desc: pt states she had nausea after receiving MAGNEVIST for breast imaging   . Penicillins     Past Medical History  Diagnosis Date  . Hypertension   . Dyslipidemia   . Diabetes mellitus   . Arthritis   . GERD (gastroesophageal reflux disease)   . Heart murmur  Past Surgical History  Procedure Laterality Date  . Cardiac catheterization      NORMAL CORONARY ARTERIES  . Mastectomy      LEFT BREAST  . Cholecystectomy    . Tonsillectomy      Family History  Problem Relation Age of Onset  . Heart attack Maternal Grandmother   . Cancer      BLADDER  . Pancreatic cancer    . Lung cancer    . Cancer Mother   . Cancer Father   . Ovarian cancer Sister   . Hypertension Sister   . Lung cancer Brother     Social History:  reports that she has never smoked. She does not have any smokeless tobacco history on file. She reports that she does not drink alcohol or use illicit drugs.  Review of Systems   She has had marked osteoarthritis in her knee joints were by orthopedic surgeon  LABS:  Appointment on 12/05/2012  Component Date Value Range Status  .  Cholesterol 12/05/2012 268* 0 - 200 mg/dL Final   ATP III Classification       Desirable:  < 200 mg/dL               Borderline High:  200 - 239 mg/dL          High:  > = 161 mg/dL  . Triglycerides 12/05/2012 101.0  0.0 - 149.0 mg/dL Final   Normal:  <096 mg/dLBorderline High:  150 - 199 mg/dL  . HDL 12/05/2012 51.30  >39.00 mg/dL Final  . VLDL 04/54/0981 20.2  0.0 - 40.0 mg/dL Final  . Total CHOL/HDL Ratio 12/05/2012 5   Final                  Men          Women1/2 Average Risk     3.4          3.3Average Risk          5.0          4.42X Average Risk          9.6          7.13X Average Risk          15.0          11.0                      . Microalb, Ur 12/05/2012 0.4  0.0 - 1.9 mg/dL Final  . Creatinine,U 19/14/7829 213.3   Final  . Microalb Creat Ratio 12/05/2012 0.2  0.0 - 30.0 mg/g Final  . Hemoglobin A1C 12/05/2012 5.9  4.6 - 6.5 % Final   Glycemic Control Guidelines for People with Diabetes:Non Diabetic:  <6%Goal of Therapy: <7%Additional Action Suggested:  >8%   . Sodium 12/05/2012 141  135 - 145 mEq/L Final  . Potassium 12/05/2012 3.6  3.5 - 5.1 mEq/L Final  . Chloride 12/05/2012 102  96 - 112 mEq/L Final  . CO2 12/05/2012 34* 19 - 32 mEq/L Final  . Glucose, Bld 12/05/2012 95  70 - 99 mg/dL Final  . BUN 56/21/3086 12  6 - 23 mg/dL Final  . Creatinine, Ser 12/05/2012 0.6  0.4 - 1.2 mg/dL Final  . Total Bilirubin 12/05/2012 1.0  0.3 - 1.2 mg/dL Final  . Alkaline Phosphatase 12/05/2012 79  39 - 117 U/L Final  . AST 12/05/2012 14  0 - 37 U/L Final  . ALT 12/05/2012 14  0 - 35 U/L Final  . Total Protein 12/05/2012 6.7  6.0 - 8.3 g/dL Final  . Albumin 96/06/5407 3.8  3.5 - 5.2 g/dL Final  . Calcium 81/19/1478 9.3  8.4 - 10.5 mg/dL Final  . GFR 29/56/2130 104.64  >60.00 mL/min Final  . Free T4 12/05/2012 1.11  0.60 - 1.60 ng/dL Final  . TSH 86/57/8469 3.49  0.35 - 5.50 uIU/mL Final  . Color, Urine 12/05/2012 YELLOW  Yellow;Lt. Yellow Final  . APPearance 12/05/2012 CLEAR  Clear Final   . Specific Gravity, Urine 12/05/2012 1.015  1.000-1.030 Final  . pH 12/05/2012 8.0  5.0 - 8.0 Final  . Total Protein, Urine 12/05/2012 NEGATIVE  Negative Final  . Urine Glucose 12/05/2012 NEGATIVE  Negative Final  . Ketones, ur 12/05/2012 NEGATIVE  Negative Final  . Bilirubin Urine 12/05/2012 SMALL  Negative Final  . Hgb urine dipstick 12/05/2012 NEGATIVE  Negative Final  . Urobilinogen, UA 12/05/2012 1.0  0.0 - 1.0 Final  . Leukocytes, UA 12/05/2012 TRACE  Negative Final  . Nitrite 12/05/2012 NEGATIVE  Negative Final  . WBC, UA 12/05/2012 0-2/hpf  0-2/hpf Final  . RBC / HPF 12/05/2012 0-2/hpf  0-2/hpf Final  . Squamous Epithelial / LPF 12/05/2012 Rare(0-4/hpf)  Rare(0-4/hpf) Final  . Direct LDL 12/05/2012 201.3   Final   Optimal:  <100 mg/dLNear or Above Optimal:  100-129 mg/dLBorderline High:  130-159 mg/dLHigh:  160-189 mg/dLVery High:  >190 mg/dL    EXAM:  BP 629/52  Pulse 87  Temp(Src) 99 F (37.2 C) (Oral)  SpO2 96%  Assessment/Plan:   1. Hypertension: Well controlled 2. Hypothyroidism: TSH is within normal range at 3.49, she will continue 100 mcg 3. She is concerned about her having leukocytosis on her last CBC which was after a steroid injection, will repeat 4. Hypercholesterolemia: Reassured her that she does not need to stop Crestor because of fatty liver which was possibly noted on an ultrasound and because of her marked increase in LDL she needs to restart this. She had tolerated this well with good results 5. History of prediabetes: Her glucose is below 100 and A1c 5.9 without any medications, we'll continue to monitor with diet alone  Angela [name] 12/11/2012, 3:23 PM   Addendum: WBC 11.2, patient notified

## 2012-12-12 ENCOUNTER — Telehealth: Payer: Self-pay | Admitting: Endocrinology

## 2012-12-12 LAB — COMPREHENSIVE METABOLIC PANEL
AST: 18 U/L (ref 0–37)
Albumin: 3.9 g/dL (ref 3.5–5.2)
BUN: 12 mg/dL (ref 6–23)
CO2: 33 mEq/L — ABNORMAL HIGH (ref 19–32)
Calcium: 9.2 mg/dL (ref 8.4–10.5)
Chloride: 99 mEq/L (ref 96–112)
Creatinine, Ser: 0.6 mg/dL (ref 0.4–1.2)
GFR: 100.69 mL/min (ref >60.00)
Potassium: 3.9 mEq/L (ref 3.5–5.1)

## 2012-12-12 LAB — LIPID PANEL: Cholesterol: 265 mg/dL — ABNORMAL HIGH (ref 0–200)

## 2012-12-12 NOTE — Telephone Encounter (Signed)
White blood cell count almost normal, will recheck on next visit, no need to do any further evaluation at this time

## 2012-12-12 NOTE — Telephone Encounter (Signed)
Called pt and advised her per Dr Lucianne Muss that her WBC is almost normal, he will recheck it at her next ov and there is no need to do any further evaluation at this time. Pt requested to have a copy of her labs mailed to her and I told her that we would. Pt understood.

## 2012-12-12 NOTE — Telephone Encounter (Signed)
Pt called requesting lab results

## 2012-12-18 ENCOUNTER — Other Ambulatory Visit: Payer: Medicare Other | Admitting: Lab

## 2012-12-18 ENCOUNTER — Encounter: Payer: Self-pay | Admitting: Genetic Counselor

## 2012-12-18 ENCOUNTER — Ambulatory Visit (HOSPITAL_BASED_OUTPATIENT_CLINIC_OR_DEPARTMENT_OTHER): Payer: Medicare Other | Admitting: Genetic Counselor

## 2012-12-18 DIAGNOSIS — Z803 Family history of malignant neoplasm of breast: Secondary | ICD-10-CM | POA: Diagnosis not present

## 2012-12-18 DIAGNOSIS — Z800 Family history of malignant neoplasm of digestive organs: Secondary | ICD-10-CM

## 2012-12-18 DIAGNOSIS — IMO0002 Reserved for concepts with insufficient information to code with codable children: Secondary | ICD-10-CM | POA: Diagnosis not present

## 2012-12-18 DIAGNOSIS — Z8041 Family history of malignant neoplasm of ovary: Secondary | ICD-10-CM

## 2012-12-18 DIAGNOSIS — Z8052 Family history of malignant neoplasm of bladder: Secondary | ICD-10-CM

## 2012-12-18 DIAGNOSIS — C50912 Malignant neoplasm of unspecified site of left female breast: Secondary | ICD-10-CM

## 2012-12-18 DIAGNOSIS — Z853 Personal history of malignant neoplasm of breast: Secondary | ICD-10-CM

## 2012-12-18 NOTE — Progress Notes (Signed)
Dr.  Varney Baas requested a consultation for genetic counseling and risk assessment for Angela [name], a 79 y.o. female, for discussion of her personal history of breast cancer and family history of ovarian, pancreatic, stomach, breast, bone and bladder cancer.  She presents to clinic today to discuss the possibility of a genetic predisposition to cancer, and to further clarify her risks, as well as her family members' risks for cancer.   HISTORY OF PRESENT ILLNESS: In 1994, at the age of 66, Angela [name] was diagnosed with breast cancer of the left breast. This was treated with unilateral mastectomy but no chemotherapy or radiation.  She never had another cancer, until recently she had a skin cancer that was removed. She is unsure of the form of skin cancer.      Past Medical History  Diagnosis Date  . Hypertension   . Dyslipidemia   . Diabetes mellitus   . Arthritis   . GERD (gastroesophageal reflux disease)   . Heart murmur   . Breast cancer 1994    left sided cancer; unilateral mastectomy, no chemo or radiation    Past Surgical History  Procedure Laterality Date  . Cardiac catheterization      NORMAL CORONARY ARTERIES  . Mastectomy      LEFT BREAST  . Cholecystectomy    . Tonsillectomy      History   Social History  . Marital Status: Widowed    Spouse Name: N/A    Number of Children: 1  . Years of Education: N/A   Social History Main Topics  . Smoking status: Never Smoker   . Smokeless tobacco: Never Used  . Alcohol Use: No  . Drug Use: No  . Sexual Activity: None   Other Topics Concern  . None   Social History Narrative  . None    REPRODUCTIVE HISTORY AND PERSONAL RISK ASSESSMENT FACTORS: Menarche was at age 36-13.   postmenopausal Uterus Intact: no Ovaries Intact: yes G1P1A0, first live birth at age 33  She has not previously undergone treatment for infertility.   Oral Contraceptive use: 3 years   She has not used HRT in the past.     FAMILY HISTORY:  We obtained a detailed, 4-generation family history.  Significant diagnoses are listed below: Family History  Problem Relation Age of Onset  . Heart attack Maternal Grandmother   . Pancreatic cancer Mother 1  . Bladder Cancer Father 1    heavy smoker and drinker  . Ovarian cancer Sister 20  . Hypertension Sister   . Lung cancer Brother 84  . Ovarian cancer Maternal Aunt 62  . Stomach cancer Maternal Grandfather   . Bone cancer Paternal Grandmother     unsure if this was a primary cancer  . Stomach cancer Paternal Grandfather   . Lung cancer Maternal Aunt     died in her 16s; former smoker  . Breast cancer Cousin     dx in her late 34s to 92s; paternal cousin  She is not aware of anyone having genetic testing.  She had two brothers and one sister.  Her sister is alive, but had ovarian cancer at age 42.  She is not close with her extended family, so she is unsure of specifics of cancer history.  Patient's maternal ancestors are of [place] and [place] descent, and paternal ancestors are of [place] descent. There is possible reported Ashkenazi Jewish ancestry. There is no known consanguinity.  GENETIC COUNSELING ASSESSMENT:  Angela [name] is a 79 y.o. female with a personal history of breast cancer and family history of breast, ovarian, pancreatic, stomach and bladder cancer which somewhat suggestive of a hereditary cancer syndrome and predisposition to cancer. We, therefore, discussed and recommended the following at today's visit.   DISCUSSION: We reviewed the characteristics, features and inheritance patterns of hereditary cancer syndromes. We also discussed genetic testing, including the appropriate family members to test, the process of testing, insurance coverage and turn-around-time for results. We reviewed the different genes associated with hereditary breast/ovarian/pancreatic cancer syndromes.  Despite that her father was a heavy smoker and drinker,  his bladder cancer and his father's gastric cancer could be related, primarily through the MMR genes assoicated with Lynch syndrome.  We discussed performing the comprehensive cancer panel that looks are more genes associated with GI cancers.    PLAN: After considering the risks, benefits, and limitations, Angela [name] provided informed consent to pursue genetic testing and the blood sample will be sent to [facility] for analysis of the Comprehensive cancer syndrome. We discussed the implications of a positive, negative and/ or variant of uncertain significance genetic test result. Results should be available within approximately 3-4 weeks' time, at which point they will be disclosed by telephone to Angela [name], as will any additional recommendations warranted by these results. Angela [name] will receive a summary of her genetic counseling visit and a copy of her results once available. This information will also be available in Epic. We encouraged Angela [name] to remain in contact with cancer genetics annually so that we can continuously update the family history and inform her of any changes in cancer genetics and testing that may be of benefit for her family. Angela [name]'s questions were answered to her satisfaction today. Our contact information was provided should additional questions or concerns arise.  The patient was seen for a total of 60 minutes, greater than 50% of which was spent face-to-face counseling.  This plan is being carried out per Dr. Feliz Beam recommendations.  This note will also be sent to the referring provider via the electronic medical record. The patient will be supplied with a summary of this genetic counseling discussion as well as educational information on the discussed hereditary cancer syndromes following the conclusion of their visit.   Patient was discussed with Dr. Drue [name].   _______________________________________________________________________ For Office Staff:  Number of people involved in session: 1 Was an Intern/ student involved with case: yes

## 2012-12-19 DIAGNOSIS — Z8041 Family history of malignant neoplasm of ovary: Secondary | ICD-10-CM | POA: Diagnosis not present

## 2012-12-19 DIAGNOSIS — Z800 Family history of malignant neoplasm of digestive organs: Secondary | ICD-10-CM | POA: Diagnosis not present

## 2012-12-19 DIAGNOSIS — Z8052 Family history of malignant neoplasm of bladder: Secondary | ICD-10-CM | POA: Diagnosis not present

## 2012-12-19 DIAGNOSIS — C50919 Malignant neoplasm of unspecified site of unspecified female breast: Secondary | ICD-10-CM | POA: Diagnosis not present

## 2012-12-20 DIAGNOSIS — M25569 Pain in unspecified knee: Secondary | ICD-10-CM | POA: Diagnosis not present

## 2012-12-29 ENCOUNTER — Telehealth: Payer: Self-pay | Admitting: Genetic Counselor

## 2012-12-29 NOTE — Telephone Encounter (Signed)
Revealed that she has a BRCA2 mutation.  SHe would like to come in the week after next as next week is busy with drs. Appointemtns.  Scheduled her for 10/20 at 3:30.

## 2013-01-01 ENCOUNTER — Other Ambulatory Visit: Payer: Self-pay | Admitting: *Deleted

## 2013-01-01 ENCOUNTER — Telehealth: Payer: Self-pay | Admitting: Endocrinology

## 2013-01-01 DIAGNOSIS — R079 Chest pain, unspecified: Secondary | ICD-10-CM

## 2013-01-01 MED ORDER — ISOSORBIDE MONONITRATE ER 30 MG PO TB24: 30.0000 mg | ORAL_TABLET | Freq: Every day | ORAL | Status: DC

## 2013-01-01 MED ORDER — ERGOCALCIFEROL 1.25 MG (50000 UT) PO CAPS: 50000.0000 [IU] | ORAL_CAPSULE | ORAL | Status: DC

## 2013-01-01 NOTE — Telephone Encounter (Signed)
Please call [phone], Genia Hotter, [facility]. Please call pt is out of meds. E-requesting refill since Thursday, faxed paper request today.

## 2013-01-05 ENCOUNTER — Other Ambulatory Visit: Payer: Self-pay | Admitting: Dermatology

## 2013-01-05 DIAGNOSIS — B079 Viral wart, unspecified: Secondary | ICD-10-CM | POA: Diagnosis not present

## 2013-01-05 DIAGNOSIS — H61009 Unspecified perichondritis of external ear, unspecified ear: Secondary | ICD-10-CM | POA: Diagnosis not present

## 2013-01-05 DIAGNOSIS — D239 Other benign neoplasm of skin, unspecified: Secondary | ICD-10-CM | POA: Diagnosis not present

## 2013-01-05 DIAGNOSIS — Z85828 Personal history of other malignant neoplasm of skin: Secondary | ICD-10-CM | POA: Diagnosis not present

## 2013-01-05 DIAGNOSIS — L905 Scar conditions and fibrosis of skin: Secondary | ICD-10-CM | POA: Diagnosis not present

## 2013-01-05 DIAGNOSIS — L821 Other seborrheic keratosis: Secondary | ICD-10-CM | POA: Diagnosis not present

## 2013-01-05 DIAGNOSIS — L723 Sebaceous cyst: Secondary | ICD-10-CM | POA: Diagnosis not present

## 2013-01-08 ENCOUNTER — Ambulatory Visit (HOSPITAL_BASED_OUTPATIENT_CLINIC_OR_DEPARTMENT_OTHER): Payer: Medicare Other | Admitting: Genetic Counselor

## 2013-01-08 DIAGNOSIS — Z8041 Family history of malignant neoplasm of ovary: Secondary | ICD-10-CM

## 2013-01-08 DIAGNOSIS — Z803 Family history of malignant neoplasm of breast: Secondary | ICD-10-CM

## 2013-01-08 DIAGNOSIS — IMO0002 Reserved for concepts with insufficient information to code with codable children: Secondary | ICD-10-CM | POA: Diagnosis not present

## 2013-01-08 DIAGNOSIS — Z853 Personal history of malignant neoplasm of breast: Secondary | ICD-10-CM | POA: Diagnosis not present

## 2013-01-08 DIAGNOSIS — Z1501 Genetic susceptibility to malignant neoplasm of breast: Secondary | ICD-10-CM | POA: Diagnosis not present

## 2013-01-09 ENCOUNTER — Encounter: Payer: Self-pay | Admitting: Genetic Counselor

## 2013-01-09 DIAGNOSIS — Z1501 Genetic susceptibility to malignant neoplasm of breast: Secondary | ICD-10-CM | POA: Insufficient documentation

## 2013-01-09 NOTE — Progress Notes (Signed)
Angela [name], a 79 y.o. female, for discussion of her genetic test results that revealed she is a carrier for a BRCA2 mutation.  She presents to clinic today to discuss the possibility of a genetic predisposition to cancer, and to further clarify her risks, as well as her family members' risks for cancer.   HISTORY OF PRESENT ILLNESS: In 1994, at the age of 64, Angela [name] was diagnosed with breast cancer of the left breast. This was treated with unilateral mastectomy but no chemotherapy or radiation. She never had another cancer, until recently she had a skin cancer that was removed. She is unsure of the form of skin cancer.  At her last visit [name] had blood drawn for the hereditary breast/ovarian cancer panel and was found to have a BRCA2 mutation.   Past Medical History  Diagnosis Date  . Hypertension   . Dyslipidemia   . Diabetes mellitus   . Arthritis   . GERD (gastroesophageal reflux disease)   . Heart murmur   . Breast cancer 1994    left sided cancer; unilateral mastectomy, no chemo or radiation    Past Surgical History  Procedure Laterality Date  . Cardiac catheterization      NORMAL CORONARY ARTERIES  . Mastectomy      LEFT BREAST  . Cholecystectomy    . Tonsillectomy      History   Social History  . Marital Status: Widowed    Spouse Name: N/A    Number of Children: 1  . Years of Education: N/A   Social History Main Topics  . Smoking status: Never Smoker   . Smokeless tobacco: Never Used  . Alcohol Use: No  . Drug Use: No  . Sexual Activity: None   Other Topics Concern  . None   Social History Narrative  . None    FAMILY HISTORY:  We obtained a detailed, 4-generation family history.  Significant diagnoses are listed below: Family History  Problem Relation Age of Onset  . Heart attack Maternal Grandmother   . Pancreatic cancer Mother 42  . Bladder Cancer Father 45    heavy smoker and drinker  . Ovarian cancer Sister 30  .  Hypertension Sister   . Lung cancer Brother 53  . Ovarian cancer Maternal Aunt 62  . Stomach cancer Maternal Grandfather   . Bone cancer Paternal Grandmother     unsure if this was a primary cancer  . Stomach cancer Paternal Grandfather   . Lung cancer Maternal Aunt     died in her 58s; former smoker  . Breast cancer Cousin     dx in her late 54s to 67s; paternal cousin  She is not aware of anyone having genetic testing. She had two brothers and one sister. Her sister is alive, but had ovarian cancer at age 69. She is not close with her extended family, so she is unsure of specifics of cancer history. She invited her sister to her results appointment today and her sister declined.  Patient's maternal ancestors are of [place] and [place] descent, and paternal ancestors are of [place] descent. There is possible reported Ashkenazi Jewish ancestry. There is no known consanguinity.  GENETIC COUNSELING ASSESSMENT: [name] [name] is a 79 y.o. female with a personal history of breast cancer and a BRCA2 mutation. We, therefore, discussed and recommended the following at today's visit.   DISCUSSION: BRCA2 is a high risk gene that is associated with up to an  87% lifetime risk for breast cancer, up to a 27% lifetime risk for ovarian cancer, and an increased risk for pancreatic cancer.  Additionally, men in the family are at risk for breast and prostate cancer.  We reviewed autosomal dominant inheritance and that her siblings and children have a 50% risk of also having the same BRCA2 mutation.  We discussed the NCCN guideline recommendations for the care and management of individuals with BRCA2 mutations.  We discussed that at 22 she may not be interested in having her other breast removed or her ovaries and fallopian tubes removed.  Therefore, screening can be implamented.  Although she should discuss these screening guidelines with Dr. Jennette Kettle, or her PCP that will follow her.  She never had an  oncologist as she did not receive chemotherapy for her breast cancer.  Anmol was given a brochure about BRCA testing, a copy of the NCCN guidelines for management, and contact information for the support organization called [employer].  We encouraged her to discuss these results with her son and sister, both of whom have children and granddaughters.  She seemed reluctant to do this.  The patient was seen for a total of 45 minutes, greater than 50% of which was spent face-to-face counseling.  This note will also be sent to the referring provider via the electronic medical record. The patient will be supplied with a summary of this genetic counseling discussion as well as educational information on the discussed hereditary cancer syndromes following the conclusion of their visit.   Patient was discussed with Dr. Drue Second.   _______________________________________________________________________ For Office Staff:  Number of people involved in session: 1 Was an Intern/ student involved with case: yes

## 2013-01-17 ENCOUNTER — Encounter: Payer: Self-pay | Admitting: Endocrinology

## 2013-01-17 DIAGNOSIS — M25569 Pain in unspecified knee: Secondary | ICD-10-CM | POA: Diagnosis not present

## 2013-02-06 ENCOUNTER — Other Ambulatory Visit: Payer: Medicare Other

## 2013-02-07 ENCOUNTER — Other Ambulatory Visit (INDEPENDENT_AMBULATORY_CARE_PROVIDER_SITE_OTHER): Payer: Medicare Other

## 2013-02-07 DIAGNOSIS — D72829 Elevated white blood cell count, unspecified: Secondary | ICD-10-CM

## 2013-02-07 DIAGNOSIS — I10 Essential (primary) hypertension: Secondary | ICD-10-CM | POA: Diagnosis not present

## 2013-02-07 DIAGNOSIS — E785 Hyperlipidemia, unspecified: Secondary | ICD-10-CM

## 2013-02-07 LAB — COMPREHENSIVE METABOLIC PANEL
ALT: 13 U/L (ref 0–35)
Alkaline Phosphatase: 73 U/L (ref 39–117)
Calcium: 9.2 mg/dL (ref 8.4–10.5)
Chloride: 101 mEq/L (ref 96–112)
Creatinine, Ser: 0.5 mg/dL (ref 0.4–1.2)
Glucose, Bld: 93 mg/dL (ref 70–99)
Sodium: 139 mEq/L (ref 135–145)
Total Bilirubin: 0.9 mg/dL (ref 0.3–1.2)
Total Protein: 6.6 g/dL (ref 6.0–8.3)

## 2013-02-07 LAB — LIPID PANEL
HDL: 52.7 mg/dL (ref >39.00)
Total CHOL/HDL Ratio: 5
Triglycerides: 94 mg/dL (ref 0.0–149.0)

## 2013-02-07 LAB — CBC WITH DIFFERENTIAL/PLATELET
Basophils Absolute: 0 10*3/uL (ref 0.0–0.1)
Basophils Relative: 0.3 % (ref 0.0–3.0)
Eosinophils Absolute: 0.1 10*3/uL (ref 0.0–0.7)
Eosinophils Relative: 1 % (ref 0.0–5.0)
HCT: 43 % (ref 36.0–46.0)
Hemoglobin: 14.6 g/dL (ref 12.0–15.0)
Lymphocytes Relative: 23 % (ref 12.0–46.0)
Lymphs Abs: 2.6 10*3/uL (ref 0.7–4.0)
MCHC: 33.9 g/dL (ref 30.0–36.0)
MCV: 91.2 fl (ref 78.0–100.0)
Monocytes Absolute: 0.8 10*3/uL (ref 0.1–1.0)
Monocytes Relative: 7 % (ref 3.0–12.0)
Neutro Abs: 7.6 10*3/uL (ref 1.4–7.7)
Neutrophils Relative %: 68.7 % (ref 43.0–77.0)
Platelets: 222 10*3/uL (ref 150.0–400.0)
RBC: 4.71 Mil/uL (ref 3.87–5.11)
RDW: 13.1 % (ref 11.5–14.6)
WBC: 11.1 10*3/uL — ABNORMAL HIGH (ref 4.5–10.5)

## 2013-02-07 LAB — LDL CHOLESTEROL, DIRECT: Direct LDL: 193.5 mg/dL

## 2013-02-09 ENCOUNTER — Other Ambulatory Visit: Payer: Medicare Other

## 2013-02-09 ENCOUNTER — Ambulatory Visit (INDEPENDENT_AMBULATORY_CARE_PROVIDER_SITE_OTHER): Payer: Medicare Other | Admitting: Endocrinology

## 2013-02-09 ENCOUNTER — Encounter: Payer: Self-pay | Admitting: Endocrinology

## 2013-02-09 VITALS — BP 120/54 | HR 83 | Temp 98.6°F | Resp 12 | Ht 67.75 in

## 2013-02-09 DIAGNOSIS — R7309 Other abnormal glucose: Secondary | ICD-10-CM

## 2013-02-09 DIAGNOSIS — R7303 Prediabetes: Secondary | ICD-10-CM

## 2013-02-09 DIAGNOSIS — Z1273 Encounter for screening for malignant neoplasm of ovary: Secondary | ICD-10-CM

## 2013-02-09 DIAGNOSIS — E785 Hyperlipidemia, unspecified: Secondary | ICD-10-CM

## 2013-02-09 DIAGNOSIS — E039 Hypothyroidism, unspecified: Secondary | ICD-10-CM

## 2013-02-09 DIAGNOSIS — M810 Age-related osteoporosis without current pathological fracture: Secondary | ICD-10-CM

## 2013-02-09 NOTE — Progress Notes (Signed)
Patient ID: Earney Navy, female   DOB: 1932/10/20, 79 y.o.   MRN: [id number]   Chief complaint: Followup for various problems  History of Present Illness:  1. Primary hypothyroidism: She has had long-standing primary hypothyroidism. Her dose has been periodically adjusted. She thinks that her energy level is fairly good now. She is quite compliant with taking her medication in the morning and her TSH is normal again 2. No recurrence of her abdominal discomfort and nausea which occurred in spring this year. Had ultrasound but no other evaluation from gastroenterologist 3. Prediabetes: This was diagnosed in 2008 with fasting glucose of 117 and a two-hour glucose of 177. She had been on metformin but not since she had problems with nausea and abdominal pain as above. Not able to exercise much because of her osteoarthritis in the knees She does not allow her weight to be taken. She thinks he is usually watching her diet 4. Hypertension: She has had long-standing hypertension which has been well controlled usually. Has not monitored her BP at home. Currently on Lotensin HCT alone 5. HYPERLIPIDEMIA: She still has not resumed her Crestor as discussed on her last visit. She again thinks she may have had some side effects from this, possibly nausea but had taken it for quite some time previously also. She has been intolerant to other statins with somewhat atypical symptoms.Highest LDL has been  over 200. Her LDL in 5/14 was 113 On treatment but much higher now     Medication List       This list is accurate as of: 02/09/13  2:05 PM.  Always use your most recent med list.               benazepril-hydrochlorthiazide 20-12.5 MG per tablet  Commonly known as:  LOTENSIN HCT  Take 1 tablet by mouth daily.     ergocalciferol 50000 UNITS capsule  Commonly known as:  VITAMIN D2  Take 1 capsule (50,000 Units total) by mouth once a week. Monday     isosorbide mononitrate 30 MG 24 hr tablet   Commonly known as:  IMDUR  Take 1 tablet (30 mg total) by mouth daily.     levothyroxine 100 MCG tablet  Commonly known as:  SYNTHROID, LEVOTHROID  Take 1 tablet by mouth 6 days a week     meloxicam 15 MG tablet  Commonly known as:  MOBIC  Take 15 mg by mouth daily.     oxyCODONE-acetaminophen 5-325 MG per tablet  Commonly known as:  PERCOCET/ROXICET  Take 1 tablet by mouth every 4 (four) hours as needed. Pain     pantoprazole 40 MG tablet  Commonly known as:  PROTONIX  Take 40 mg by mouth daily.     polyethylene glycol packet  Commonly known as:  MIRALAX / GLYCOLAX  Take 17 g by mouth 3 (three) times daily as needed.     rosuvastatin 5 MG tablet  Commonly known as:  CRESTOR  Take 5 mg by mouth daily.        Allergies:  Allergies  Allergen Reactions  . Crestor [Rosuvastatin Calcium] Anaphylaxis    Mouth swelling  . Gadolinium      Desc: pt states she had nausea after receiving MAGNEVIST for breast imaging   . Penicillins     Past Medical History  Diagnosis Date  . Hypertension   . Dyslipidemia   . Diabetes mellitus   . Arthritis   . GERD (gastroesophageal reflux disease)   .  Heart murmur   . Breast cancer 1994    left sided cancer; unilateral mastectomy, no chemo or radiation  . BRCA2 positive 10/214    Past Surgical History  Procedure Laterality Date  . Cardiac catheterization      NORMAL CORONARY ARTERIES  . Mastectomy      LEFT BREAST  . Cholecystectomy    . Tonsillectomy      Family History  Problem Relation Age of Onset  . Heart attack Maternal Grandmother   . Pancreatic cancer Mother 67  . Bladder Cancer Father 22    heavy smoker and drinker  . Ovarian cancer Sister 1  . Hypertension Sister   . Lung cancer Brother 58  . Ovarian cancer Maternal Aunt 62  . Stomach cancer Maternal Grandfather   . Bone cancer Paternal Grandmother     unsure if this was a primary cancer  . Stomach cancer Paternal Grandfather   . Lung cancer Maternal  Aunt     died in her 56s; former smoker  . Breast cancer Cousin     dx in her late 56s to 98s; paternal cousin    Social History:  reports that she has never smoked. She has never used smokeless tobacco. She reports that she does not drink alcohol or use illicit drugs.  Review of Systems   She has had marked osteoarthritis in her knee joints were by orthopedic surgeon  LABS:  Appointment on 02/07/2013  Component Date Value Range Status  . Cholesterol 02/07/2013 256* 0 - 200 mg/dL Final   ATP III Classification       Desirable:  < 200 mg/dL               Borderline High:  200 - 239 mg/dL          High:  > = 161 mg/dL  . Triglycerides 02/07/2013 94.0  0.0 - 149.0 mg/dL Final   Normal:  <096 mg/dLBorderline High:  150 - 199 mg/dL  . HDL 02/07/2013 52.70  >39.00 mg/dL Final  . VLDL 04/54/0981 18.8  0.0 - 40.0 mg/dL Final  . Total CHOL/HDL Ratio 02/07/2013 5   Final                  Men          Women1/2 Average Risk     3.4          3.3Average Risk          5.0          4.42X Average Risk          9.6          7.13X Average Risk          15.0          11.0                      . Sodium 02/07/2013 139  135 - 145 mEq/L Final  . Potassium 02/07/2013 3.9  3.5 - 5.1 mEq/L Final  . Chloride 02/07/2013 101  96 - 112 mEq/L Final  . CO2 02/07/2013 31  19 - 32 mEq/L Final  . Glucose, Bld 02/07/2013 93  70 - 99 mg/dL Final  . BUN 19/14/7829 16  6 - 23 mg/dL Final  . Creatinine, Ser 02/07/2013 0.5  0.4 - 1.2 mg/dL Final  . Total Bilirubin 02/07/2013 0.9  0.3 - 1.2 mg/dL Final  . Alkaline Phosphatase 02/07/2013 73  39 -  117 U/L Final  . AST 02/07/2013 15  0 - 37 U/L Final  . ALT 02/07/2013 13  0 - 35 U/L Final  . Total Protein 02/07/2013 6.6  6.0 - 8.3 g/dL Final  . Albumin 29/56/2130 3.8  3.5 - 5.2 g/dL Final  . Calcium 86/57/8469 9.2  8.4 - 10.5 mg/dL Final  . GFR 62/95/2841 115.85  >60.00 mL/min Final  . WBC 02/07/2013 11.1* 4.5 - 10.5 K/uL Final  . RBC 02/07/2013 4.71  3.87 - 5.11 Mil/uL  Final  . Hemoglobin 02/07/2013 14.6  12.0 - 15.0 g/dL Final  . HCT 32/44/0102 43.0  36.0 - 46.0 % Final  . MCV 02/07/2013 91.2  78.0 - 100.0 fl Final  . MCHC 02/07/2013 33.9  30.0 - 36.0 g/dL Final  . RDW 72/53/6644 13.1  11.5 - 14.6 % Final  . Platelets 02/07/2013 222.0  150.0 - 400.0 K/uL Final  . Neutrophils Relative % 02/07/2013 68.7  43.0 - 77.0 % Final  . Lymphocytes Relative 02/07/2013 23.0  12.0 - 46.0 % Final  . Monocytes Relative 02/07/2013 7.0  3.0 - 12.0 % Final  . Eosinophils Relative 02/07/2013 1.0  0.0 - 5.0 % Final  . Basophils Relative 02/07/2013 0.3  0.0 - 3.0 % Final  . Neutro Abs 02/07/2013 7.6  1.4 - 7.7 K/uL Final  . Lymphs Abs 02/07/2013 2.6  0.7 - 4.0 K/uL Final  . Monocytes Absolute 02/07/2013 0.8  0.1 - 1.0 K/uL Final  . Eosinophils Absolute 02/07/2013 0.1  0.0 - 0.7 K/uL Final  . Basophils Absolute 02/07/2013 0.0  0.0 - 0.1 K/uL Final  . Direct LDL 02/07/2013 193.5   Final   Optimal:  <100 mg/dLNear or Above Optimal:  100-129 mg/dLBorderline High:  130-159 mg/dLHigh:  160-189 mg/dLVery High:  >190 mg/dL    EXAM:  BP 034/74  Pulse 83  Temp(Src) 98.6 F (37 C)  Resp 12  Ht 5' 7.75" (1.721 m)  SpO2 96%  Assessment/Plan:   1. Hypertension: Well controlled on Zestoretic  2. Hypothyroidism: TSH was within normal range at 3.49 on her last visit, will recheck on her next visit in 3 months 3. She has upper normal white blood cell count with normal differential. Do not think this is pathological, she thinks this is related to recurrent infections and currently has a left lower leg infection 4. Hypercholesterolemia: Reassured her that she did not have nausea from Crestor this summer because she had taken it without any problems for several months previously. Discussed that because of her marked increase in LDL she needs to restart this. She agrees to try this again 5. History of prediabetes: Her glucose is still below 100 and A1c 5.9 on her last visit without any  medications, will continue to monitor with efforts to lose weight alone  Kavion Mancinas 02/09/2013, 2:05 PM

## 2013-02-09 NOTE — Patient Instructions (Signed)
Restart Crestor daily  Low fat daily

## 2013-02-12 ENCOUNTER — Other Ambulatory Visit: Payer: Medicare Other

## 2013-02-12 DIAGNOSIS — Z1273 Encounter for screening for malignant neoplasm of ovary: Secondary | ICD-10-CM

## 2013-02-13 DIAGNOSIS — M25569 Pain in unspecified knee: Secondary | ICD-10-CM | POA: Diagnosis not present

## 2013-02-26 ENCOUNTER — Other Ambulatory Visit: Payer: Self-pay | Admitting: Endocrinology

## 2013-03-13 DIAGNOSIS — M25569 Pain in unspecified knee: Secondary | ICD-10-CM | POA: Diagnosis not present

## 2013-03-27 ENCOUNTER — Telehealth: Payer: Self-pay | Admitting: *Deleted

## 2013-03-27 ENCOUNTER — Other Ambulatory Visit: Payer: Self-pay | Admitting: *Deleted

## 2013-03-27 DIAGNOSIS — M1710 Unilateral primary osteoarthritis, unspecified knee: Secondary | ICD-10-CM | POA: Diagnosis not present

## 2013-03-27 MED ORDER — HYDROCOD POLST-CHLORPHEN POLST 10-8 MG/5ML PO LQCR: 5.0000 mL | Freq: Every evening | ORAL | Status: DC | PRN

## 2013-03-27 NOTE — Telephone Encounter (Signed)
Tussionex 5 mL at bedtime as needed, 60 mL prescription, no refill To call if having worsening cough, fever or yellow sputum

## 2013-03-27 NOTE — Telephone Encounter (Signed)
Pt is complaining of a cough for several days, she says it gets worse at night, she is coughing up a clear discharge and it's very bothersome to her, she wants to know if you can call her in a cough medicine?

## 2013-03-28 ENCOUNTER — Other Ambulatory Visit: Payer: Self-pay | Admitting: *Deleted

## 2013-03-28 ENCOUNTER — Telehealth: Payer: Self-pay | Admitting: *Deleted

## 2013-03-28 MED ORDER — HYDROCOD POLST-CHLORPHEN POLST 10-8 MG/5ML PO LQCR: 5.0000 mL | Freq: Every evening | ORAL | Status: DC | PRN

## 2013-03-28 NOTE — Telephone Encounter (Signed)
Pt wants to know if you can refill her cough medicine so she has some for the weekend.  She does sound very bad, she said she was running a fever last night but not today and she's having only a clear discharge, but the cough is very bad.

## 2013-03-28 NOTE — Telephone Encounter (Signed)
ok 

## 2013-03-30 ENCOUNTER — Other Ambulatory Visit: Payer: Self-pay | Admitting: *Deleted

## 2013-04-02 ENCOUNTER — Telehealth: Payer: Self-pay | Admitting: *Deleted

## 2013-04-02 ENCOUNTER — Other Ambulatory Visit: Payer: Self-pay | Admitting: *Deleted

## 2013-04-02 MED ORDER — AZITHROMYCIN 250 MG PO TABS: ORAL_TABLET | ORAL | Status: DC

## 2013-04-02 NOTE — Telephone Encounter (Signed)
Z-Pack ok

## 2013-04-02 NOTE — Telephone Encounter (Signed)
Pt called today, she's feeling somewhat better from her cough, although she's not starting blowing out yellowish discharge from her nose mixed with blood, she said she's also coughing up a yellow discharge from her chest, wants to know if she should be taking a z-pack?

## 2013-04-02 NOTE — Telephone Encounter (Signed)
Noted, rx sent.

## 2013-04-10 DIAGNOSIS — M1710 Unilateral primary osteoarthritis, unspecified knee: Secondary | ICD-10-CM | POA: Diagnosis not present

## 2013-04-17 DIAGNOSIS — M1710 Unilateral primary osteoarthritis, unspecified knee: Secondary | ICD-10-CM | POA: Diagnosis not present

## 2013-04-24 ENCOUNTER — Other Ambulatory Visit: Payer: Self-pay | Admitting: *Deleted

## 2013-04-24 DIAGNOSIS — M1710 Unilateral primary osteoarthritis, unspecified knee: Secondary | ICD-10-CM | POA: Diagnosis not present

## 2013-04-24 MED ORDER — AZITHROMYCIN 250 MG PO TABS: ORAL_TABLET | ORAL | Status: DC

## 2013-05-09 ENCOUNTER — Other Ambulatory Visit: Payer: Self-pay | Admitting: *Deleted

## 2013-05-09 DIAGNOSIS — E119 Type 2 diabetes mellitus without complications: Secondary | ICD-10-CM

## 2013-05-09 DIAGNOSIS — Z1501 Genetic susceptibility to malignant neoplasm of breast: Secondary | ICD-10-CM

## 2013-05-09 DIAGNOSIS — Z1509 Genetic susceptibility to other malignant neoplasm: Secondary | ICD-10-CM

## 2013-05-09 DIAGNOSIS — E039 Hypothyroidism, unspecified: Secondary | ICD-10-CM

## 2013-05-11 ENCOUNTER — Other Ambulatory Visit: Payer: Medicare Other

## 2013-05-14 ENCOUNTER — Ambulatory Visit: Payer: Medicare Other | Admitting: Endocrinology

## 2013-05-28 ENCOUNTER — Other Ambulatory Visit: Payer: Self-pay | Admitting: *Deleted

## 2013-05-28 ENCOUNTER — Telehealth: Payer: Self-pay | Admitting: Endocrinology

## 2013-05-28 ENCOUNTER — Other Ambulatory Visit (INDEPENDENT_AMBULATORY_CARE_PROVIDER_SITE_OTHER): Payer: Medicare Other

## 2013-05-28 DIAGNOSIS — E785 Hyperlipidemia, unspecified: Secondary | ICD-10-CM | POA: Diagnosis not present

## 2013-05-28 DIAGNOSIS — E039 Hypothyroidism, unspecified: Secondary | ICD-10-CM | POA: Diagnosis not present

## 2013-05-28 DIAGNOSIS — Z1501 Genetic susceptibility to malignant neoplasm of breast: Secondary | ICD-10-CM

## 2013-05-28 DIAGNOSIS — R7303 Prediabetes: Secondary | ICD-10-CM

## 2013-05-28 DIAGNOSIS — Z8041 Family history of malignant neoplasm of ovary: Secondary | ICD-10-CM

## 2013-05-28 DIAGNOSIS — M810 Age-related osteoporosis without current pathological fracture: Secondary | ICD-10-CM | POA: Diagnosis not present

## 2013-05-28 DIAGNOSIS — E119 Type 2 diabetes mellitus without complications: Secondary | ICD-10-CM

## 2013-05-28 DIAGNOSIS — R7309 Other abnormal glucose: Secondary | ICD-10-CM | POA: Diagnosis not present

## 2013-05-28 LAB — COMPREHENSIVE METABOLIC PANEL
ALT: 18 U/L (ref 0–35)
AST: 22 U/L (ref 0–37)
Albumin: 3.6 g/dL (ref 3.5–5.2)
Alkaline Phosphatase: 71 U/L (ref 39–117)
BUN: 8 mg/dL (ref 6–23)
CALCIUM: 9.3 mg/dL (ref 8.4–10.5)
CHLORIDE: 103 meq/L (ref 96–112)
CO2: 29 mEq/L (ref 19–32)
Creatinine, Ser: 0.5 mg/dL (ref 0.4–1.2)
GFR: 135.87 mL/min (ref >60.00)
GLUCOSE: 87 mg/dL (ref 70–99)
Potassium: 3.1 mEq/L — ABNORMAL LOW (ref 3.5–5.1)
Sodium: 142 mEq/L (ref 135–145)
Total Bilirubin: 1 mg/dL (ref 0.3–1.2)
Total Protein: 6.2 g/dL (ref 6.0–8.3)

## 2013-05-28 LAB — URINALYSIS, ROUTINE W REFLEX MICROSCOPIC
Hgb urine dipstick: NEGATIVE
Nitrite: NEGATIVE
RBC / HPF: NONE SEEN
Specific Gravity, Urine: 1.025
Urine Glucose: NEGATIVE
Urobilinogen, UA: 0.2
pH: 6 (ref 5.0–8.0)

## 2013-05-28 LAB — LIPID PANEL
Cholesterol: 224 mg/dL — ABNORMAL HIGH (ref 0–200)
HDL: 43.3 mg/dL
LDL Cholesterol: 151 mg/dL — ABNORMAL HIGH (ref 0–99)
Total CHOL/HDL Ratio: 5
Triglycerides: 150 mg/dL — ABNORMAL HIGH (ref 0.0–149.0)
VLDL: 30 mg/dL (ref 0.0–40.0)

## 2013-05-28 LAB — HEMOGLOBIN A1C: Hgb A1c MFr Bld: 5.6 % (ref 4.6–6.5)

## 2013-05-28 LAB — TSH: TSH: 2.19 u[IU]/mL (ref 0.35–5.50)

## 2013-05-28 LAB — T4, FREE: Free T4: 0.91 ng/dL (ref 0.60–1.60)

## 2013-05-28 NOTE — Telephone Encounter (Signed)
Pt states she has an 11:30 lab appt and would like for Suanne Marker to call her as she has questions   Call back: [phone]  Thank you :)

## 2013-05-29 ENCOUNTER — Other Ambulatory Visit: Payer: Self-pay | Admitting: *Deleted

## 2013-05-29 DIAGNOSIS — M1710 Unilateral primary osteoarthritis, unspecified knee: Secondary | ICD-10-CM | POA: Diagnosis not present

## 2013-05-29 LAB — VITAMIN D 25 HYDROXY (VIT D DEFICIENCY, FRACTURES): VIT D 25 HYDROXY: 79 ng/mL (ref 30–89)

## 2013-05-30 ENCOUNTER — Other Ambulatory Visit: Payer: Self-pay | Admitting: *Deleted

## 2013-05-30 ENCOUNTER — Ambulatory Visit (INDEPENDENT_AMBULATORY_CARE_PROVIDER_SITE_OTHER): Payer: Medicare Other | Admitting: Endocrinology

## 2013-05-30 ENCOUNTER — Encounter: Payer: Self-pay | Admitting: Endocrinology

## 2013-05-30 VITALS — BP 128/72 | HR 84 | Temp 98.1°F | Resp 16 | Ht 68.0 in

## 2013-05-30 DIAGNOSIS — E785 Hyperlipidemia, unspecified: Secondary | ICD-10-CM | POA: Diagnosis not present

## 2013-05-30 DIAGNOSIS — R7309 Other abnormal glucose: Secondary | ICD-10-CM

## 2013-05-30 DIAGNOSIS — R1909 Other intra-abdominal and pelvic swelling, mass and lump: Secondary | ICD-10-CM

## 2013-05-30 DIAGNOSIS — E039 Hypothyroidism, unspecified: Secondary | ICD-10-CM

## 2013-05-30 DIAGNOSIS — I10 Essential (primary) hypertension: Secondary | ICD-10-CM | POA: Diagnosis not present

## 2013-05-30 DIAGNOSIS — R7303 Prediabetes: Secondary | ICD-10-CM

## 2013-05-30 MED ORDER — BENAZEPRIL-HYDROCHLOROTHIAZIDE 20-12.5 MG PO TABS: 1.0000 | ORAL_TABLET | Freq: Every day | ORAL | Status: DC

## 2013-05-30 NOTE — Progress Notes (Signed)
Patient ID: Angela [name], female   DOB: 1933/03/10, 80 y.o.   MRN: [id number]   Chief complaint: Followup for various problems  History of Present Illness:  Primary hypothyroidism: She has had long-standing primary hypothyroidism. She has been on several doses of levothyroxine but recently on consistent amounts. Her energy level is fairly good now. She is quite compliant with taking her medication in the morning and her TSH is normal again at 2.2 1. No recurrence of her abdominal discomfort and nausea which occurred nearly a year ago. Etiology was not clear. She did have hepatic steatosis on ultrasound evaluation from gastroenterologist 2. Prediabetes: This was diagnosed in 2008 with fasting glucose of 117 and a two-hour glucose of 177. She had been on metformin but not since she had problems with nausea and abdominal pain as above. Not able to exercise much because of her osteoarthritis in the knees She does not allow her weight to be taken but thinks she is losing weight. She thinks he is usually watching her diet. Blood sugar under 80 at home usually and A1c is good 3. Hypertension: She has had long-standing hypertension which has been well controlled usually. Has occasionally monitored her BP at home. Currently on Lotensin HCT  4. HYPERLIPIDEMIA: She still has not resumed her Crestor even though this was discussed several times before. She is afraid of possible side effects including liver effects and ? Abdominal discomfort. She wants to try diet alone and does not like medications. Currently her LDL level has improved Highest LDL has been  over 200.  Lab Results  Component Value Date   CHOL 224* 05/28/2013   HDL 43.30 05/28/2013   LDLCALC 151* 05/28/2013   LDLDIRECT 193.5 02/07/2013   TRIG 150.0* 05/28/2013   CHOLHDL 5 05/28/2013   5. Asking about a feeling of freezing sensation in her head when she turns her neck to the left i in certain positions only. No neck pain no radiating pain to her arm        Medication List       This list is accurate as of: 05/30/13  1:35 PM.  Always use your most recent med list.               azithromycin 250 MG tablet  Commonly known as:  ZITHROMAX  Take 2 tablets the first day, then one tablet daily until finished.     benazepril-hydrochlorthiazide 20-12.5 MG per tablet  Commonly known as:  LOTENSIN HCT  Take 1 tablet by mouth daily.     chlorpheniramine-HYDROcodone 10-8 MG/5ML Lqcr  Commonly known as:  TUSSIONEX PENNKINETIC ER  Take 5 mLs by mouth at bedtime as needed for cough.     ergocalciferol 50000 UNITS capsule  Commonly known as:  VITAMIN D2  Take 1 capsule (50,000 Units total) by mouth once a week. Monday     isosorbide mononitrate 30 MG 24 hr tablet  Commonly known as:  IMDUR  Take 1 tablet (30 mg total) by mouth daily.     levothyroxine 100 MCG tablet  Commonly known as:  SYNTHROID, LEVOTHROID  Take 1 tablet by mouth 6 days a week     oxyCODONE-acetaminophen 5-325 MG per tablet  Commonly known as:  PERCOCET/ROXICET  Take 1 tablet by mouth every 4 (four) hours as needed. Pain     pantoprazole 40 MG tablet  Commonly known as:  PROTONIX  TAKE 1 TABLET ONCE DAILY.     polyethylene glycol packet  Commonly known  as:  MIRALAX / GLYCOLAX  Take 17 g by mouth 3 (three) times daily as needed.     rosuvastatin 5 MG tablet  Commonly known as:  CRESTOR  Take 5 mg by mouth daily.        Allergies:  Allergies  Allergen Reactions  . Crestor [Rosuvastatin Calcium] Anaphylaxis    Mouth swelling  . Gadolinium      Desc: pt states she had nausea after receiving MAGNEVIST for breast imaging   . Penicillins     Past Medical History  Diagnosis Date  . Hypertension   . Dyslipidemia   . Diabetes mellitus   . Arthritis   . GERD (gastroesophageal reflux disease)   . Heart murmur   . Breast cancer 1994    left sided cancer; unilateral mastectomy, no chemo or radiation  . BRCA2 positive 10/214    Past Surgical  History  Procedure Laterality Date  . Cardiac catheterization      NORMAL CORONARY ARTERIES  . Mastectomy      LEFT BREAST  . Cholecystectomy    . Tonsillectomy      Family History  Problem Relation Age of Onset  . Heart attack Maternal Grandmother   . Pancreatic cancer Mother 74  . Bladder Cancer Father 86    heavy smoker and drinker  . Ovarian cancer Sister 50  . Hypertension Sister   . Lung cancer Brother 41  . Ovarian cancer Maternal Aunt 62  . Stomach cancer Maternal Grandfather   . Bone cancer Paternal Grandmother     unsure if this was a primary cancer  . Stomach cancer Paternal Grandfather   . Lung cancer Maternal Aunt     died in her 50s; former smoker  . Breast cancer Cousin     dx in her late 1s to 64s; paternal cousin    Social History:  reports that she has never smoked. She has never used smokeless tobacco. She reports that she does not drink alcohol or use illicit drugs.  Review of Systems   She has had marked osteoarthritis in her knee joints  Rx by orthopedic surgeon  LABS:  Appointment on 05/28/2013  Component Date Value Ref Range Status  . Hemoglobin A1C 05/28/2013 5.6  4.6 - 6.5 % Final   Glycemic Control Guidelines for People with Diabetes:Non Diabetic:  <6%Goal of Therapy: <7%Additional Action Suggested:  >8%   . TSH 05/28/2013 2.19  0.35 - 5.50 uIU/mL Final  . Free T4 05/28/2013 0.91  0.60 - 1.60 ng/dL Final  . Cholesterol 05/28/2013 224* 0 - 200 mg/dL Final   ATP III Classification       Desirable:  < 200 mg/dL               Borderline High:  200 - 239 mg/dL          High:  > = 240 mg/dL  . Triglycerides 05/28/2013 150.0* 0.0 - 149.0 mg/dL Final   Normal:  <150 mg/dLBorderline High:  150 - 199 mg/dL  . HDL 05/28/2013 43.30  >39.00 mg/dL Final  . VLDL 05/28/2013 30.0  0.0 - 40.0 mg/dL Final  . LDL Cholesterol 05/28/2013 151* 0 - 99 mg/dL Final  . Total CHOL/HDL Ratio 05/28/2013 5   Final                  Men          Women1/2 Average Risk      3.4  3.3Average Risk          5.0          4.42X Average Risk          9.6          7.13X Average Risk          15.0          11.0                      . Color, Urine 05/28/2013 YELLOW  Yellow;Lt. Yellow Final  . APPearance 05/28/2013 CLEAR  Clear Final  . Specific Gravity, Urine 05/28/2013 1.025  1.000-1.030 Final  . pH 05/28/2013 6.0  5.0 - 8.0 Final  . Total Protein, Urine 05/28/2013 TRACE* Negative Final  . Urine Glucose 05/28/2013 NEGATIVE  Negative Final  . Ketones, ur 05/28/2013 TRACE* Negative Final  . Bilirubin Urine 05/28/2013 MODERATE* Negative Final  . Hgb urine dipstick 05/28/2013 NEGATIVE  Negative Final  . Urobilinogen, UA 05/28/2013 0.2  0.0 - 1.0 Final  . Leukocytes, UA 05/28/2013 SMALL* Negative Final  . Nitrite 05/28/2013 NEGATIVE  Negative Final  . WBC, UA 05/28/2013 7-10/hpf* 0-2/hpf Final  . RBC / HPF 05/28/2013 none seen  0-2/hpf Final  . Mucus, UA 05/28/2013 Presence of* None Final  . Squamous Epithelial / LPF 05/28/2013 Few(5-10/hpf)* Rare(0-4/hpf) Final  . Sodium 05/28/2013 142  135 - 145 mEq/L Final  . Potassium 05/28/2013 3.1* 3.5 - 5.1 mEq/L Final  . Chloride 05/28/2013 103  96 - 112 mEq/L Final  . CO2 05/28/2013 29  19 - 32 mEq/L Final  . Glucose, Bld 05/28/2013 87  70 - 99 mg/dL Final  . BUN 05/28/2013 8  6 - 23 mg/dL Final  . Creatinine, Ser 05/28/2013 0.5  0.4 - 1.2 mg/dL Final  . Total Bilirubin 05/28/2013 1.0  0.3 - 1.2 mg/dL Final  . Alkaline Phosphatase 05/28/2013 71  39 - 117 U/L Final  . AST 05/28/2013 22  0 - 37 U/L Final  . ALT 05/28/2013 18  0 - 35 U/L Final  . Total Protein 05/28/2013 6.2  6.0 - 8.3 g/dL Final  . Albumin 05/28/2013 3.6  3.5 - 5.2 g/dL Final  . Calcium 05/28/2013 9.3  8.4 - 10.5 mg/dL Final  . GFR 05/28/2013 135.87  >60.00 mL/min Final  . Vit D, 25-Hydroxy 05/28/2013 79  30 - 89 ng/mL Final   Comment: This assay accurately quantifies Vitamin D, which is the sum of the                          25-Hydroxy forms of  Vitamin D2 and D3.  Studies have shown that the                          optimum concentration of 25-Hydroxy Vitamin D is 30 ng/mL or higher.                           Concentrations of Vitamin D between 20 and 29 ng/mL are considered to                          be insufficient and concentrations less than 20 ng/mL are considered  to be deficient for Vitamin D.    EXAM:  BP 128/72  Pulse 84  Temp(Src) 98.1 F (36.7 C)  Resp 16  Ht '5\' 8"'  (1.727 m)  SpO2 95%  Assessment/Plan:   1. Hypertension: Well controlled on Zestoretic  2. Hypothyroidism: TSH  has been consistently in  normal range  and she will continue the same dose  3. She has had upper normal white blood cell count with normal differential. 4. Hypercholesterolemia: despite repeated reassurances that she did not have any side effects from Crestor and it is safe and effective in primary prevention she refuses to take this. LDL is relatively better this time.  5. History of prediabetes: Her glucose is still below 100 and A1c  is 5.6 without any medications, will continue to monitor with efforts to lose weight alone 6. Likely has cervical spondylosis mild pressure symptoms when she is turning her head, reassured her that no further management needed at this time  [facility] 05/30/2013, 1:35 PM

## 2013-05-31 ENCOUNTER — Telehealth: Payer: Self-pay | Admitting: Endocrinology

## 2013-05-31 LAB — CA 125: CA 125: <2 U/mL (ref 0.0–30.2)

## 2013-05-31 NOTE — Telephone Encounter (Signed)
Patient was requesting lab results she also wanted her labs mailed to her. Labs mailed, results given

## 2013-05-31 NOTE — Telephone Encounter (Signed)
Pt declined to give a detailed message  Please call this pt back   Call back:[phone]  Thank You:)

## 2013-05-31 NOTE — Progress Notes (Signed)
Quick Note:  Please let patient know that the lab result is normal and no further action needed ______ 

## 2013-06-11 DIAGNOSIS — H3530 Unspecified macular degeneration: Secondary | ICD-10-CM | POA: Diagnosis not present

## 2013-06-25 ENCOUNTER — Other Ambulatory Visit: Payer: Self-pay | Admitting: Endocrinology

## 2013-06-26 DIAGNOSIS — M1710 Unilateral primary osteoarthritis, unspecified knee: Secondary | ICD-10-CM | POA: Diagnosis not present

## 2013-07-25 DIAGNOSIS — M1710 Unilateral primary osteoarthritis, unspecified knee: Secondary | ICD-10-CM | POA: Diagnosis not present

## 2013-07-30 ENCOUNTER — Other Ambulatory Visit: Payer: Self-pay | Admitting: Endocrinology

## 2013-08-09 ENCOUNTER — Other Ambulatory Visit: Payer: Self-pay | Admitting: Endocrinology

## 2013-08-14 DIAGNOSIS — M1710 Unilateral primary osteoarthritis, unspecified knee: Secondary | ICD-10-CM | POA: Diagnosis not present

## 2013-09-03 ENCOUNTER — Telehealth: Payer: Self-pay | Admitting: *Deleted

## 2013-09-03 ENCOUNTER — Telehealth: Payer: Self-pay | Admitting: Endocrinology

## 2013-09-03 NOTE — Telephone Encounter (Signed)
Z-Pack ok, may use Robitussin with codeine 2 tsf qid prn for cough

## 2013-09-03 NOTE — Telephone Encounter (Signed)
Patient would like to see if Dr. Dwyane Dee could call her in a Z-pac  Thank You :)

## 2013-09-03 NOTE — Telephone Encounter (Signed)
She also wants to know if you can call her in the Hydrocodone cough syrup as well to help with her cough.

## 2013-09-03 NOTE — Telephone Encounter (Signed)
rx verbally called in 

## 2013-09-03 NOTE — Telephone Encounter (Signed)
Patient came back from [place] very sick, she sounds terrible and feels she has a sinus infection, she has a fever, blowing dark green stuff out of her nose and feels awful.  She wants to know if we can call a Z-Pak in for her?  Please advise

## 2013-09-07 ENCOUNTER — Telehealth: Payer: Self-pay | Admitting: *Deleted

## 2013-09-07 ENCOUNTER — Other Ambulatory Visit: Payer: Self-pay | Admitting: *Deleted

## 2013-09-07 MED ORDER — MOXIFLOXACIN HCL 400 MG PO TABS: ORAL_TABLET | ORAL | Status: DC

## 2013-09-07 MED ORDER — HYDROCOD POLST-CHLORPHEN POLST 10-8 MG/5ML PO LQCR: 5.0000 mL | Freq: Every evening | ORAL | Status: DC | PRN

## 2013-09-07 NOTE — Telephone Encounter (Signed)
Patient called, she has finished her Z-pak, but she is still coughing up yellow mucus.  Her cough has gotten just a bit better, she's not able to sleep well because she's coughing all the time.  She wants to know if she should continue the antibiotic or take something different?

## 2013-09-07 NOTE — Telephone Encounter (Signed)
Change to Avelox 400 mg daily for 5 days May try Tussionex 5 mL at bedtime for cough, 60 mL

## 2013-09-10 ENCOUNTER — Telehealth: Payer: Self-pay | Admitting: *Deleted

## 2013-09-10 ENCOUNTER — Other Ambulatory Visit: Payer: Self-pay | Admitting: *Deleted

## 2013-09-10 MED ORDER — AMOXICILLIN-POT CLAVULANATE 875-125 MG PO TABS: 1.0000 | ORAL_TABLET | Freq: Two times a day (BID) | ORAL | Status: DC

## 2013-09-10 NOTE — Telephone Encounter (Signed)
She said she is still coughing up yellowish mucus.

## 2013-09-10 NOTE — Telephone Encounter (Signed)
Patient called about the Avelox you sent in for her, it's not covered under her insurance and pharmacy recommends Cipro, okay to send that?

## 2013-09-10 NOTE — Telephone Encounter (Signed)
Noted, rx sent, patient aware, she will schedule appt soon

## 2013-09-10 NOTE — Telephone Encounter (Signed)
If she has less cough and sputum is clearing up she can hold off,

## 2013-09-10 NOTE — Telephone Encounter (Signed)
Augmentin 875 mg twice a day, 10 tablets, needs to be seen in the office if not better

## 2013-09-18 DIAGNOSIS — M1710 Unilateral primary osteoarthritis, unspecified knee: Secondary | ICD-10-CM | POA: Diagnosis not present

## 2013-10-15 DIAGNOSIS — D2330 Other benign neoplasm of skin of unspecified part of face: Secondary | ICD-10-CM | POA: Diagnosis not present

## 2013-10-15 DIAGNOSIS — L821 Other seborrheic keratosis: Secondary | ICD-10-CM | POA: Diagnosis not present

## 2013-10-15 DIAGNOSIS — L719 Rosacea, unspecified: Secondary | ICD-10-CM | POA: Diagnosis not present

## 2013-10-15 DIAGNOSIS — L570 Actinic keratosis: Secondary | ICD-10-CM | POA: Diagnosis not present

## 2013-10-15 DIAGNOSIS — D239 Other benign neoplasm of skin, unspecified: Secondary | ICD-10-CM | POA: Diagnosis not present

## 2013-10-15 DIAGNOSIS — L820 Inflamed seborrheic keratosis: Secondary | ICD-10-CM | POA: Diagnosis not present

## 2013-10-16 DIAGNOSIS — M1710 Unilateral primary osteoarthritis, unspecified knee: Secondary | ICD-10-CM | POA: Diagnosis not present

## 2013-11-12 ENCOUNTER — Other Ambulatory Visit: Payer: Self-pay | Admitting: Endocrinology

## 2013-11-13 DIAGNOSIS — M1710 Unilateral primary osteoarthritis, unspecified knee: Secondary | ICD-10-CM | POA: Diagnosis not present

## 2013-11-20 DIAGNOSIS — L570 Actinic keratosis: Secondary | ICD-10-CM | POA: Diagnosis not present

## 2013-11-20 DIAGNOSIS — L719 Rosacea, unspecified: Secondary | ICD-10-CM | POA: Diagnosis not present

## 2013-11-27 DIAGNOSIS — M1710 Unilateral primary osteoarthritis, unspecified knee: Secondary | ICD-10-CM | POA: Diagnosis not present

## 2013-12-04 DIAGNOSIS — M1710 Unilateral primary osteoarthritis, unspecified knee: Secondary | ICD-10-CM | POA: Diagnosis not present

## 2013-12-11 DIAGNOSIS — M1710 Unilateral primary osteoarthritis, unspecified knee: Secondary | ICD-10-CM | POA: Diagnosis not present

## 2013-12-12 ENCOUNTER — Other Ambulatory Visit: Payer: Self-pay | Admitting: *Deleted

## 2013-12-12 MED ORDER — BENAZEPRIL-HYDROCHLOROTHIAZIDE 20-12.5 MG PO TABS: 1.0000 | ORAL_TABLET | Freq: Every day | ORAL | Status: DC

## 2013-12-17 ENCOUNTER — Other Ambulatory Visit: Payer: Self-pay | Admitting: *Deleted

## 2013-12-17 ENCOUNTER — Telehealth: Payer: Self-pay | Admitting: *Deleted

## 2013-12-17 DIAGNOSIS — M8580 Other specified disorders of bone density and structure, unspecified site: Secondary | ICD-10-CM

## 2013-12-17 NOTE — Telephone Encounter (Signed)
We can order CBC, diagnosis osteopenia

## 2013-12-17 NOTE — Telephone Encounter (Signed)
Patient is coming in October 6th, she wants to know if you will check her white blood count.

## 2013-12-17 NOTE — Telephone Encounter (Signed)
Noted, ordered

## 2013-12-25 ENCOUNTER — Ambulatory Visit (INDEPENDENT_AMBULATORY_CARE_PROVIDER_SITE_OTHER): Payer: Medicare Other | Admitting: Endocrinology

## 2013-12-25 ENCOUNTER — Encounter: Payer: Self-pay | Admitting: Endocrinology

## 2013-12-25 VITALS — BP 122/70 | HR 72 | Temp 98.0°F | Resp 16 | Ht 68.0 in

## 2013-12-25 DIAGNOSIS — E785 Hyperlipidemia, unspecified: Secondary | ICD-10-CM | POA: Diagnosis not present

## 2013-12-25 DIAGNOSIS — E039 Hypothyroidism, unspecified: Secondary | ICD-10-CM | POA: Diagnosis not present

## 2013-12-25 DIAGNOSIS — I10 Essential (primary) hypertension: Secondary | ICD-10-CM | POA: Diagnosis not present

## 2013-12-25 DIAGNOSIS — R7303 Prediabetes: Secondary | ICD-10-CM

## 2013-12-25 DIAGNOSIS — R7309 Other abnormal glucose: Secondary | ICD-10-CM

## 2013-12-25 LAB — COMPREHENSIVE METABOLIC PANEL
ALT: 13 U/L (ref 0–35)
AST: 14 U/L (ref 0–37)
Albumin: 3.9 g/dL (ref 3.5–5.2)
Alkaline Phosphatase: 89 U/L (ref 39–117)
BUN: 14 mg/dL (ref 6–23)
CO2: 32 meq/L (ref 19–32)
CREATININE: 0.6 mg/dL (ref 0.4–1.2)
Calcium: 9.2 mg/dL (ref 8.4–10.5)
Chloride: 100 mEq/L (ref 96–112)
GFR: 100.42 mL/min (ref >60.00)
GLUCOSE: 88 mg/dL (ref 70–99)
Potassium: 3.6 mEq/L (ref 3.5–5.1)
Sodium: 139 mEq/L (ref 135–145)
TOTAL PROTEIN: 6.9 g/dL (ref 6.0–8.3)
Total Bilirubin: 1.1 mg/dL (ref 0.2–1.2)

## 2013-12-25 LAB — CBC
HEMATOCRIT: 45.9 % (ref 36.0–46.0)
HEMOGLOBIN: 15.6 g/dL — AB (ref 12.0–15.0)
MCHC: 33.9 g/dL (ref 30.0–36.0)
MCV: 89.8 fl (ref 78.0–100.0)
Platelets: 239 10*3/uL (ref 150.0–400.0)
RBC: 5.11 Mil/uL (ref 3.87–5.11)
RDW: 13.7 % (ref 11.5–15.5)
WBC: 12.2 10*3/uL — ABNORMAL HIGH (ref 4.0–10.5)

## 2013-12-25 LAB — T4, FREE: Free T4: 1.19 ng/dL (ref 0.60–1.60)

## 2013-12-25 LAB — TSH: TSH: 3.04 u[IU]/mL (ref 0.35–4.50)

## 2013-12-25 LAB — HEMOGLOBIN A1C: Hgb A1c MFr Bld: 5.6 % (ref 4.6–6.5)

## 2013-12-25 NOTE — Progress Notes (Signed)
Patient ID: Angela [name], female   DOB: 05-14-1932, 80 y.o.   MRN: [id number]   Chief complaint: Followup for various problems  History of Present Illness:   Primary hypothyroidism: She has had long-standing primary hypothyroidism. She has been on various doses of levothyroxine but recently on consistent dosage. Her energy level is fairly good now. She is quite compliant with taking her medication in the morning and her TSH is pending  Now complaining about hair loss for the last couple of weeks  Prediabetes: This was diagnosed in 2008 with fasting glucose of 117 and a two-hour glucose of 177. She had been on metformin but not since she had problems with nausea and abdominal pain in 2014. Not able to exercise much because of her osteoarthritis in the knees She does not allow her weight to be taken. She thinks he is usually watching her diet. Blood sugar 71-84 at home usually and A1c is usually below 6%. She may have blood sugars as high as 112 is getting intra-articular steroids. Walks some now  Lab Results  Component Value Date   HGBA1C 5.6 05/28/2013   HGBA1C 5.9 12/05/2012   Lab Results  Component Value Date   MICROALBUR 0.4 12/05/2012   LDLCALC 151* 05/28/2013   CREATININE 0.5 05/28/2013    1. Hypertension: She has had long-standing hypertension which has been well controlled usually. Has occasionally monitored her BP at home. Currently on Lotensin HCT  2. HYPERLIPIDEMIA: She still has not taken her Crestor even though this the benefits were discussed several times before. She is afraid of possible side effects including liver effects and ? Abdominal discomfort. She wants to try diet alone and does not like medications. On her last visit her LDL level had improved. Highest LDL has been  over 200.  Lab Results  Component Value Date   CHOL 224* 05/28/2013   HDL 43.30 05/28/2013   LDLCALC 151* 05/28/2013   LDLDIRECT 193.5 02/07/2013   TRIG 150.0* 05/28/2013   CHOLHDL 5 05/28/2013   5. Asking  about MRI for breast evaluation since she cannot have mammograms, she will contact her gynecologist       Medication List       This list is accurate as of: 12/25/13  3:15 PM.  Always use your most recent med list.               benazepril-hydrochlorthiazide 20-12.5 MG per tablet  Commonly known as:  LOTENSIN HCT  Take 1 tablet by mouth daily.     isosorbide mononitrate 30 MG 24 hr tablet  Commonly known as:  IMDUR  TAKE 1 TABLET ONCE DAILY.     oxyCODONE-acetaminophen 5-325 MG per tablet  Commonly known as:  PERCOCET/ROXICET  Take 1 tablet by mouth every 4 (four) hours as needed. Pain     pantoprazole 40 MG tablet  Commonly known as:  PROTONIX  TAKE 1 TABLET ONCE DAILY.     polyethylene glycol packet  Commonly known as:  MIRALAX / GLYCOLAX  Take 17 g by mouth 3 (three) times daily as needed.     rosuvastatin 5 MG tablet  Commonly known as:  CRESTOR  Take 5 mg by mouth daily.     SYNTHROID 100 MCG tablet  Generic drug:  levothyroxine  TAKE 1 TABLET ONCE A DAY SIX DAYS A WEEK.     Vitamin D (Ergocalciferol) 50000 UNITS Caps capsule  Commonly known as:  DRISDOL  TAKE ONE CAPSULE ONCE A WEEK.  Allergies:  Allergies  Allergen Reactions  . Crestor [Rosuvastatin Calcium] Anaphylaxis    Mouth swelling  . Gadolinium      Desc: pt states she had nausea after receiving MAGNEVIST for breast imaging   . Penicillins     Past Medical History  Diagnosis Date  . Hypertension   . Dyslipidemia   . Diabetes mellitus   . Arthritis   . GERD (gastroesophageal reflux disease)   . Heart murmur   . Breast cancer 1994    left sided cancer; unilateral mastectomy, no chemo or radiation  . BRCA2 positive 10/214    Past Surgical History  Procedure Laterality Date  . Cardiac catheterization      NORMAL CORONARY ARTERIES  . Mastectomy      LEFT BREAST  . Cholecystectomy    . Tonsillectomy      Family History  Problem Relation Age of Onset  . Heart attack  Maternal Grandmother   . Pancreatic cancer Mother 83  . Bladder Cancer Father 34    heavy smoker and drinker  . Ovarian cancer Sister 46  . Hypertension Sister   . Lung cancer Brother 75  . Ovarian cancer Maternal Aunt 62  . Stomach cancer Maternal Grandfather   . Bone cancer Paternal Grandmother     unsure if this was a primary cancer  . Stomach cancer Paternal Grandfather   . Lung cancer Maternal Aunt     died in her 53s; former smoker  . Breast cancer Cousin     dx in her late 39s to 41s; paternal cousin    Social History:  reports that she has never smoked. She has never used smokeless tobacco. She reports that she does not drink alcohol or use illicit drugs.  Review of Systems   She has had marked osteoarthritis in her knee joints    Getting Synvisc injections by orthopedic surgeon  She had osteopenia with T score -1.8 in 2012, not treated  LABS:  Pending  EXAM:  BP 122/70  Pulse 72  Temp(Src) 98 F (36.7 C)  Resp 16  Ht _0  (1.727 m)  SpO2 96%  Assessment/Plan:   1. Hypertension: Well controlled on Zestoretic  2. Hypothyroidism: TSH  has been consistently in  normal range  and she will have her TSH checked today e  3. She has had upper normal white blood cell count with normal differential. Will check this again 4. Hypercholesterolemia: despite repeated reassurances that she did not have any side effects from Crestor and it is safe and effective in primary prevention she refuses to take this. LDL is pending  5. History of prediabetes: Her glucose is still below 100 at home and A1c  is pending. Most likely she can continue without any medications, will continue to monitor with efforts to lose weight alone 6. Hair loss: Unclear etiology 7. Influenza vaccine  given  Javonnie Illescas 12/25/2013, 3:15 PM

## 2013-12-26 NOTE — Progress Notes (Signed)
Quick Note:  Thyroid, glucose and A1c test normal; white blood cell count is still high, need to have her discuss with her oncologist ______

## 2013-12-28 ENCOUNTER — Telehealth: Payer: Self-pay | Admitting: *Deleted

## 2013-12-28 NOTE — Telephone Encounter (Signed)
Wants to know about her labs

## 2013-12-31 ENCOUNTER — Other Ambulatory Visit: Payer: Self-pay | Admitting: Obstetrics & Gynecology

## 2013-12-31 DIAGNOSIS — Z853 Personal history of malignant neoplasm of breast: Secondary | ICD-10-CM

## 2014-01-01 DIAGNOSIS — M170 Bilateral primary osteoarthritis of knee: Secondary | ICD-10-CM | POA: Diagnosis not present

## 2014-01-10 ENCOUNTER — Telehealth: Payer: Self-pay | Admitting: *Deleted

## 2014-01-10 ENCOUNTER — Other Ambulatory Visit: Payer: Medicare Other

## 2014-01-10 ENCOUNTER — Other Ambulatory Visit: Payer: Self-pay | Admitting: *Deleted

## 2014-01-10 MED ORDER — AZITHROMYCIN 250 MG PO TABS: ORAL_TABLET | ORAL | Status: DC

## 2014-01-10 MED ORDER — HYDROCOD POLST-CHLORPHEN POLST 10-8 MG/5ML PO LQCR
5.0000 mL | Freq: Two times a day (BID) | ORAL | Status: DC
Start: 2014-01-10 — End: 2014-02-28

## 2014-01-10 NOTE — Telephone Encounter (Signed)
Patient called, she's had a horrible cough x3 days coughing up yellowish/green stuff.  Fever of 101,  Sounds awful.  She wants to know if she can get a z-pak and cough medicine.  Please advise

## 2014-01-10 NOTE — Telephone Encounter (Signed)
Zithromax five-day pack, she can try Tussionex 1 teaspoon twice a day, 60 mL with no refills

## 2014-01-10 NOTE — Telephone Encounter (Signed)
Noted, rx filled, patient aware

## 2014-01-11 ENCOUNTER — Other Ambulatory Visit: Payer: Self-pay | Admitting: *Deleted

## 2014-01-11 MED ORDER — ISOSORBIDE MONONITRATE ER 30 MG PO TB24: ORAL_TABLET | ORAL | Status: DC

## 2014-01-14 DIAGNOSIS — Z01419 Encounter for gynecological examination (general) (routine) without abnormal findings: Secondary | ICD-10-CM | POA: Diagnosis not present

## 2014-01-15 DIAGNOSIS — M170 Bilateral primary osteoarthritis of knee: Secondary | ICD-10-CM | POA: Diagnosis not present

## 2014-01-21 ENCOUNTER — Ambulatory Visit
Admission: RE | Admit: 2014-01-21 | Discharge: 2014-01-21 | Disposition: A | Discharge status: Home / Self Care | Payer: Medicare Other | Source: Ambulatory Visit | Attending: Obstetrics & Gynecology | Admitting: Obstetrics & Gynecology

## 2014-01-21 DIAGNOSIS — Z853 Personal history of malignant neoplasm of breast: Secondary | ICD-10-CM

## 2014-01-21 MED ORDER — GADOBENATE DIMEGLUMINE 529 MG/ML IV SOLN
19.0000 mL | Freq: Once | INTRAVENOUS | Status: AC | PRN
  Administered 2014-01-21: 19 mL via INTRAVENOUS

## 2014-01-22 DIAGNOSIS — Z961 Presence of intraocular lens: Secondary | ICD-10-CM | POA: Diagnosis not present

## 2014-01-22 DIAGNOSIS — H3531 Nonexudative age-related macular degeneration: Secondary | ICD-10-CM | POA: Diagnosis not present

## 2014-02-05 DIAGNOSIS — M170 Bilateral primary osteoarthritis of knee: Secondary | ICD-10-CM | POA: Diagnosis not present

## 2014-02-06 ENCOUNTER — Other Ambulatory Visit: Payer: Self-pay | Admitting: Endocrinology

## 2014-02-11 ENCOUNTER — Telehealth: Payer: Self-pay | Admitting: *Deleted

## 2014-02-11 NOTE — Telephone Encounter (Signed)
Pt called in question about why she has not received a call for an appt w/ Dr. Benay Spice.  I checked and did not see any notes about this pt.  Called Santiago Glad at [facility] and she said that she faxed it on 11/12 to the main fax #.  Sent Tiffany an inbasket requesting for her to check on this and to call the pt w/ an appt.  Made the pt aware of this and she is fine with a call from [place] this week.

## 2014-02-12 ENCOUNTER — Telehealth: Payer: Self-pay | Admitting: Oncology

## 2014-02-12 NOTE — Telephone Encounter (Signed)
left message for patient to return call to schedule np appt.  °

## 2014-02-28 ENCOUNTER — Ambulatory Visit: Payer: Medicare Other

## 2014-02-28 ENCOUNTER — Other Ambulatory Visit (HOSPITAL_BASED_OUTPATIENT_CLINIC_OR_DEPARTMENT_OTHER): Payer: Medicare Other

## 2014-02-28 ENCOUNTER — Ambulatory Visit (HOSPITAL_BASED_OUTPATIENT_CLINIC_OR_DEPARTMENT_OTHER): Payer: Medicare Other | Admitting: Oncology

## 2014-02-28 ENCOUNTER — Encounter: Payer: Self-pay | Admitting: Oncology

## 2014-02-28 ENCOUNTER — Other Ambulatory Visit: Payer: Self-pay | Admitting: Oncology

## 2014-02-28 ENCOUNTER — Telehealth: Payer: Self-pay | Admitting: Oncology

## 2014-02-28 VITALS — BP 163/89 | HR 86 | Temp 97.4°F | Resp 20 | Ht 68.0 in

## 2014-02-28 DIAGNOSIS — E039 Hypothyroidism, unspecified: Secondary | ICD-10-CM | POA: Diagnosis not present

## 2014-02-28 DIAGNOSIS — I10 Essential (primary) hypertension: Secondary | ICD-10-CM

## 2014-02-28 DIAGNOSIS — D72829 Elevated white blood cell count, unspecified: Secondary | ICD-10-CM

## 2014-02-28 DIAGNOSIS — C9210 Chronic myeloid leukemia, BCR/ABL-positive, not having achieved remission: Secondary | ICD-10-CM | POA: Diagnosis not present

## 2014-02-28 LAB — CBC WITH DIFFERENTIAL/PLATELET
BASO%: 0.2 % (ref 0.0–2.0)
Basophils Absolute: 0 10*3/uL (ref 0.0–0.1)
EOS%: 1.2 % (ref 0.0–7.0)
Eosinophils Absolute: 0.1 10*3/uL (ref 0.0–0.5)
HEMATOCRIT: 43.7 % (ref 34.8–46.6)
HEMOGLOBIN: 15.1 g/dL (ref 11.6–15.9)
LYMPH%: 29.3 % (ref 14.0–49.7)
MCH: 31 pg (ref 25.1–34.0)
MCHC: 34.6 g/dL (ref 31.5–36.0)
MCV: 89.7 fL (ref 79.5–101.0)
MONO#: 1 10*3/uL — ABNORMAL HIGH (ref 0.1–0.9)
MONO%: 8.6 % (ref 0.0–14.0)
NEUT#: 6.7 10*3/uL — ABNORMAL HIGH (ref 1.5–6.5)
NEUT%: 60.7 % (ref 38.4–76.8)
Platelets: 177 10*3/uL (ref 145–400)
RBC: 4.87 10*6/uL (ref 3.70–5.45)
RDW: 13.5 % (ref 11.2–14.5)
WBC: 11 10*3/uL — ABNORMAL HIGH (ref 3.9–10.3)
lymph#: 3.2 10*3/uL (ref 0.9–3.3)

## 2014-02-28 LAB — LACTATE DEHYDROGENASE (CC13): LDH: 197 U/L (ref 125–245)

## 2014-02-28 LAB — CHCC SMEAR

## 2014-02-28 NOTE — Telephone Encounter (Signed)
Pt confirmed labs/ov per 12/10 POF, gave pt AVS.... KJ

## 2014-02-28 NOTE — Progress Notes (Signed)
[facility] Patient Consult   Referring MD: Tyniesha Howald Ward Gallant 80 y.o.  Apr 11, 1932    Reason for Referral: Leukocytosis   HPI: Ms. Leonides Schanz is referred for evaluation of leukocytosis. She reports being aware of an elevated white count for approximately 2 years. A CBC on 12/25/2013 returned with a white count of 12.2, hemoglobin 15.6, and platelets 239,000. No white cell differential was obtained. In November 2014 the hemoglobin returned at 14.6, white count 11.1, absolute neutrophil count 7.6, and platelets 222,000.  She feels well. She reports no recent infection. She receives intermittent steroid injections for knee arthritis, last approximate 6 weeks ago.  Past Medical History  Diagnosis Date  . Hypertension   . Dyslipidemia   . Diabetes mellitus   . Arthritis   . GERD (gastroesophageal reflux disease)   . Heart murmur   . Breast cancer 1994    left sided cancer; unilateral mastectomy, no chemo or radiation  . BRCA2 positive 10/214    .   "Phlebitis "following a cholecystectomy in her 54s   .    G1 P1  Past Surgical History  Procedure Laterality Date  . Cardiac catheterization      NORMAL CORONARY ARTERIES  . Mastectomy   1994     LEFT BREAST  . Cholecystectomy    . Tonsillectomy      .   Hysterectomy                                                                                                                   age 27  Medications: Reviewed  Allergies:  Allergies  Allergen Reactions  . Crestor [Rosuvastatin Calcium] Anaphylaxis    Mouth swelling  . Gadolinium      Desc: pt states she had nausea after receiving MAGNEVIST for breast imaging   . Penicillins Rash    Family history: Her mother had pancreas cancer dates 1, father had bladder cancer dates 24, her sister had ovarian cancer 23s.  Social History:   She lives alone in [place]. 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 She does not  use cigarettes. She drinks wine approximately twice per year. She may have received a red cell transfusion with a tonsillectomy at age 62. No risk factor for HIV or hepatitis.     ROS:   Positives include: Occasional nausea, chronic bilateral knee pain, easy bruising, nocturia  A complete ROS was otherwise negative.  Physical Exam:  Blood pressure 163/89, pulse 86, temperature 97.4 F (36.3 C), temperature source Oral, resp. rate 20, height '5\' 8"'  (1.727 m), weight 0 lb (0 kg).  HEENT: Oropharynx without visible mass, neck without mass Lungs: Clear bilaterally Cardiac: Regular rate and rhythm, 2/6 systolic murmur Abdomen: No hepatosplenomegaly, nontender , no mass  Vascular: No leg edema Lymph nodes: No cervical, supra-clavicular, axillary, or inguinal nodes Neurologic: Alert and oriented, the motor exam appears intact in the upper and lower extremities Skin:  No rash. Musculoskeletal: No spine tenderness. Swelling at the right knee, valgus deformity at the left knee Breast: Status post left mastectomy. Bilateral implants in place. No mass or evidence of chest wall tumor recurrence   LAB:  CBC  Lab Results  Component Value Date   WBC 11.0* 02/28/2014   HGB 15.1 02/28/2014   HCT 43.7 02/28/2014   MCV 89.7 02/28/2014   PLT 177 02/28/2014   NEUTROABS 6.7* 02/28/2014   blood smear: The red cell morphology is unremarkable, the platelet appeared normal in number. The majority of the white cells are mature neutrophils. No blasts or myelocytes. Increase in granular lymphocytes, but there is not a monotonous lymphoid population.  CMP      Component Value Date/Time   NA 139 12/25/2013 1053   K 3.6 12/25/2013 1053   CL 100 12/25/2013 1053   CO2 32 12/25/2013 1053   GLUCOSE 88 12/25/2013 1053   BUN 14 12/25/2013 1053   CREATININE 0.6 12/25/2013 1053   CALCIUM 9.2 12/25/2013 1053   PROT 6.9 12/25/2013 1053   ALBUMIN 3.9 12/25/2013 1053   AST 14 12/25/2013 1053   ALT 13  12/25/2013 1053   ALKPHOS 89 12/25/2013 1053   BILITOT 1.1 12/25/2013 1053   GFRNONAA >60 10/12/2007 0613   GFRAA  10/12/2007 0613    >60        The eGFR has been calculated using the MDRD equation. This calculation has not been validated in all clinical       Assessment/Plan:   1. Leukocytosis-mild neutrophilia 2. Knee arthritis 3. BRCA2 carrier 4. Remote history of breast cancer 5. Hypothyroidism 6. Hyperlipidemia 7. Family history of multiple cancers   Disposition:   Ms. Indiyah Paone is referred for evaluation of leukocytosis. She has mild neutrophilia. I suspect this is a benign finding, potentially related to steroid injection therapy for knee arthritis. The differential diagnosis includes a myeloproliferative disorder.  Her white count has not changed significantly over the past year. We checked a peripheral blood PCR for the BCR: ABL translocation to rule out a diagnosis of CML.  I do not recommend further diagnostic evaluation at present. She will return for an office visit and CBC in 8 months.  I encouraged her to discuss the indication for a prophylactic right mastectomy and oophorectomy procedure with Dr. Nori Riis. I also recommended she alert her family members of the BRCA2 diagnosis so they can be tested and receive appropriate screening.  [place], [place] 02/28/2014, 4:08 PM

## 2014-02-28 NOTE — Progress Notes (Signed)
Checked in new patient with no issues prior to seeing the dr. She has appt card and has not been traveling.  °

## 2014-03-05 DIAGNOSIS — M170 Bilateral primary osteoarthritis of knee: Secondary | ICD-10-CM | POA: Diagnosis not present

## 2014-03-11 ENCOUNTER — Other Ambulatory Visit: Payer: Self-pay | Admitting: Endocrinology

## 2014-04-02 DIAGNOSIS — M170 Bilateral primary osteoarthritis of knee: Secondary | ICD-10-CM | POA: Diagnosis not present

## 2014-04-30 ENCOUNTER — Encounter: Payer: Self-pay | Admitting: Endocrinology

## 2014-04-30 ENCOUNTER — Ambulatory Visit (INDEPENDENT_AMBULATORY_CARE_PROVIDER_SITE_OTHER): Payer: Medicare Other | Admitting: Endocrinology

## 2014-04-30 VITALS — BP 130/72 | HR 90 | Temp 98.0°F | Resp 14 | Ht 68.0 in

## 2014-04-30 DIAGNOSIS — R7309 Other abnormal glucose: Secondary | ICD-10-CM | POA: Diagnosis not present

## 2014-04-30 DIAGNOSIS — I10 Essential (primary) hypertension: Secondary | ICD-10-CM

## 2014-04-30 DIAGNOSIS — E559 Vitamin D deficiency, unspecified: Secondary | ICD-10-CM

## 2014-04-30 DIAGNOSIS — M170 Bilateral primary osteoarthritis of knee: Secondary | ICD-10-CM | POA: Diagnosis not present

## 2014-04-30 DIAGNOSIS — E785 Hyperlipidemia, unspecified: Secondary | ICD-10-CM | POA: Diagnosis not present

## 2014-04-30 DIAGNOSIS — M8580 Other specified disorders of bone density and structure, unspecified site: Secondary | ICD-10-CM

## 2014-04-30 DIAGNOSIS — R7303 Prediabetes: Secondary | ICD-10-CM

## 2014-04-30 DIAGNOSIS — E039 Hypothyroidism, unspecified: Secondary | ICD-10-CM | POA: Diagnosis not present

## 2014-04-30 LAB — LIPID PANEL
Cholesterol: 240 mg/dL — ABNORMAL HIGH (ref 0–200)
HDL: 46.4 mg/dL (ref >39.00)
LDL CALC: 169 mg/dL — AB (ref 0–99)
NonHDL: 193.6
TRIGLYCERIDES: 123 mg/dL (ref 0.0–149.0)
Total CHOL/HDL Ratio: 5
VLDL: 24.6 mg/dL (ref 0.0–40.0)

## 2014-04-30 LAB — CBC
HCT: 43 % (ref 36.0–46.0)
HEMOGLOBIN: 14.7 g/dL (ref 12.0–15.0)
MCHC: 34.1 g/dL (ref 30.0–36.0)
MCV: 90 fl (ref 78.0–100.0)
PLATELETS: 235 10*3/uL (ref 150.0–400.0)
RBC: 4.78 Mil/uL (ref 3.87–5.11)
RDW: 13.2 % (ref 11.5–15.5)
WBC: 10.6 10*3/uL — ABNORMAL HIGH (ref 4.0–10.5)

## 2014-04-30 LAB — COMPREHENSIVE METABOLIC PANEL
ALBUMIN: 3.7 g/dL (ref 3.5–5.2)
ALT: 14 U/L (ref 0–35)
AST: 16 U/L (ref 0–37)
Alkaline Phosphatase: 89 U/L (ref 39–117)
BUN: 9 mg/dL (ref 6–23)
CHLORIDE: 100 meq/L (ref 96–112)
CO2: 35 meq/L — AB (ref 19–32)
Calcium: 9.3 mg/dL (ref 8.4–10.5)
Creatinine, Ser: 0.5 mg/dL (ref 0.40–1.20)
GFR: 126.21 mL/min (ref >60.00)
Glucose, Bld: 117 mg/dL — ABNORMAL HIGH (ref 70–99)
POTASSIUM: 3.1 meq/L — AB (ref 3.5–5.1)
Sodium: 142 mEq/L (ref 135–145)
Total Bilirubin: 1.4 mg/dL — ABNORMAL HIGH (ref 0.2–1.2)
Total Protein: 6.3 g/dL (ref 6.0–8.3)

## 2014-04-30 LAB — T4, FREE: Free T4: 1.23 ng/dL (ref 0.60–1.60)

## 2014-04-30 LAB — HEMOGLOBIN A1C: Hgb A1c MFr Bld: 5.7 % (ref 4.6–6.5)

## 2014-04-30 LAB — TSH: TSH: 2.71 u[IU]/mL (ref 0.35–4.50)

## 2014-04-30 NOTE — Progress Notes (Signed)
Patient ID: Angela [name], female   DOB: 30-Jul-1932, 81 y.o.   MRN: [id number]    Chief complaint: Followup for various problems  History of Present Illness:  Primary hypothyroidism: She has had long-standing primary hypothyroidism. She has been on Fairly consistent  dosage.  On 100 g, 6 days/week Her energy level is fairly good now. She is quite compliant with taking her medication in the morning and her TSH is pending      Lab Results  Component Value Date   TSH 3.04 12/25/2013   TSH 2.19 05/28/2013   TSH 3.49 12/05/2012   FREET4 1.19 12/25/2013   FREET4 0.91 05/28/2013   FREET4 1.11 12/05/2012    Prediabetes: This was diagnosed in 2008 with fasting glucose of 117 and a two-hour glucose of 177. She had been on metformin but not since she had problems with nausea and abdominal pain in 2014. Not able to exercise much because of her osteoarthritis in the knees She does not allow her weight to be taken. She thinks he is usually watching her diet. Blood sugar fasting is normal at home usually and A1c is usually below 6%. She may have blood sugars as high as 112  if getting intra-articular steroids.  Labs pending  Lab Results  Component Value Date   HGBA1C 5.6 12/25/2013   HGBA1C 5.6 05/28/2013   HGBA1C 5.9 12/05/2012   Lab Results  Component Value Date   MICROALBUR 0.4 12/05/2012   LDLCALC 151* 05/28/2013   CREATININE 0.6 12/25/2013    Hypertension: She has had long-standing hypertension which has been well controlled usually. Has  No lightheadedness.   Currently on Lotensin HCT    HYPERLIPIDEMIA: She  Previously had taken Crestor. She is afraid of possible side effects including liver effects and ? Abdominal discomfort. She wants to try diet alone usually but today is agreeable to possibly trying pravastatin since her sister has been able to take this recently    On her last visit her LDL level had improved. Highest LDL has been  over 200.  Lab Results  Component Value  Date   CHOL 224* 05/28/2013   HDL 43.30 05/28/2013   LDLCALC 151* 05/28/2013   LDLDIRECT 193.5 02/07/2013   TRIG 150.0* 05/28/2013   CHOLHDL 5 05/28/2013          Medication List       This list is accurate as of: 04/30/14 11:24 AM.  Always use your most recent med list.               benazepril-hydrochlorthiazide 20-12.5 MG per tablet  Commonly known as:  LOTENSIN HCT  TAKE 1 TABLET ONCE DAILY.     isosorbide mononitrate 30 MG 24 hr tablet  Commonly known as:  IMDUR  TAKE 1 TABLET ONCE DAILY.     PERCOCET 10-325 MG per tablet  Generic drug:  oxyCODONE-acetaminophen  Take 1 tablet by mouth as needed.     oxyCODONE-acetaminophen 5-325 MG per tablet  Commonly known as:  PERCOCET/ROXICET  Take 1 tablet by mouth every 4 (four) hours as needed. Pain     pantoprazole 40 MG tablet  Commonly known as:  PROTONIX  TAKE 1 TABLET ONCE DAILY.     polyethylene glycol packet  Commonly known as:  MIRALAX / GLYCOLAX  Take 17 g by mouth 3 (three) times daily as needed.     rosuvastatin 5 MG tablet  Commonly known as:  CRESTOR  Take 5 mg by mouth daily.  SYNTHROID 100 MCG tablet  Generic drug:  levothyroxine  TAKE 1 TABLET ONCE A DAY SIX DAYS A WEEK.     Vitamin D (Ergocalciferol) 50000 UNITS Caps capsule  Commonly known as:  DRISDOL  TAKE ONE CAPSULE ONCE A WEEK.     VOLTAREN 1 % Gel  Generic drug:  diclofenac sodium  Apply topically 4 (four) times daily. To knees        Allergies:  Allergies  Allergen Reactions  . Crestor [Rosuvastatin Calcium] Anaphylaxis    Mouth swelling  . Gadolinium      Desc: pt states she had nausea after receiving MAGNEVIST for breast imaging   . Penicillins Rash    Past Medical History  Diagnosis Date  . Hypertension   . Dyslipidemia   . Diabetes mellitus   . Arthritis   . GERD (gastroesophageal reflux disease)   . Heart murmur   . Breast cancer 1994    left sided cancer; unilateral mastectomy, no chemo or radiation  .  BRCA2 positive 10/214    Past Surgical History  Procedure Laterality Date  . Cardiac catheterization      NORMAL CORONARY ARTERIES  . Mastectomy      LEFT BREAST  . Cholecystectomy    . Tonsillectomy      Family History  Problem Relation Age of Onset  . Heart attack Maternal Grandmother   . Pancreatic cancer Mother 23  . Bladder Cancer Father 31    heavy smoker and drinker  . Ovarian cancer Sister 64  . Hypertension Sister   . Lung cancer Brother 34  . Ovarian cancer Maternal Aunt 62  . Stomach cancer Maternal Grandfather   . Bone cancer Paternal Grandmother     unsure if this was a primary cancer  . Stomach cancer Paternal Grandfather   . Lung cancer Maternal Aunt     died in her 67s; former smoker  . Breast cancer Cousin     dx in her late 18s to 40s; paternal cousin    Social History:  reports that she has never smoked. She has never used smokeless tobacco. She reports that she does not drink alcohol or use illicit drugs.  Review of Systems   She has had marked osteoarthritis in her knee joints Getting Synvisc injections by orthopedic surgeon  She had osteopenia with T score -1.8 in 2012 but she thinks she had another bone density, she will find out from her orthopedic surgeon about the report    She continues to take vitamin D 50,000 units weekly, her last level was 79 in 3/15   She is asking for another ovarian cancer marker test   Her leukocytosis was felt to be unrelated to the pathological processes by hematologist    EXAM:  BP 130/72 mmHg  Pulse 90  Temp(Src) 98 F (36.7 C)  Resp 14  Ht '5\' 8"'  (1.727 m)  Wt   SpO2 94%   thyroid not palpable. No pedal edema Biceps reflexes are normal  Assessment/Plan:   1. Hypertension: Well controlled on Zestoretic  2. Hypothyroidism: TSH  has been consistently in  normal range and she will have her TSH checked today.  Subjectively looks euthyroid  3. She has had upper normal white blood cell count with  normal differential , repeat pending.   4. Hypercholesterolemia:  She wants to possibly try Pravastatin if LDL is significantly high.  5. History of prediabetes: Her glucose is below 100 at home and A1c  is pending. Most  likely she can continue without any medications, will continue to monitor with efforts to lose weight   6.   Vitamin D deficiency.  She is not due for another level but she can try taking her supplement twice a month now    [facility] 04/30/2014, 11:24 AM

## 2014-04-30 NOTE — Patient Instructions (Signed)
Vitamin D twice a month

## 2014-05-01 ENCOUNTER — Other Ambulatory Visit: Payer: Self-pay | Admitting: *Deleted

## 2014-05-01 ENCOUNTER — Telehealth: Payer: Self-pay | Admitting: Endocrinology

## 2014-05-01 MED ORDER — LISINOPRIL 40 MG PO TABS: 40.0000 mg | ORAL_TABLET | Freq: Every day | ORAL | Status: DC

## 2014-05-01 MED ORDER — PRAVASTATIN SODIUM 20 MG PO TABS: 20.0000 mg | ORAL_TABLET | Freq: Every day | ORAL | Status: DC

## 2014-05-01 NOTE — Telephone Encounter (Signed)
Please mail lab results to pt

## 2014-05-02 ENCOUNTER — Telehealth: Payer: Self-pay | Admitting: *Deleted

## 2014-05-02 ENCOUNTER — Other Ambulatory Visit: Payer: Self-pay | Admitting: *Deleted

## 2014-05-02 MED ORDER — POTASSIUM CHLORIDE CRYS ER 20 MEQ PO TBCR: 20.0000 meq | EXTENDED_RELEASE_TABLET | Freq: Every day | ORAL | Status: DC

## 2014-05-02 NOTE — Telephone Encounter (Signed)
Noted, left message on patients vm informing her, rx sent

## 2014-05-02 NOTE — Telephone Encounter (Signed)
Patient is adamant that she does not want to change her bp medicine, she said she will eat more banana's to increase potassium.

## 2014-05-02 NOTE — Telephone Encounter (Signed)
Eating bananas is not enough for the potassium and has to much sugar.  Need to start K Dur 20 mEq daily

## 2014-05-20 DIAGNOSIS — K5900 Constipation, unspecified: Secondary | ICD-10-CM | POA: Diagnosis not present

## 2014-05-20 DIAGNOSIS — K219 Gastro-esophageal reflux disease without esophagitis: Secondary | ICD-10-CM | POA: Diagnosis not present

## 2014-05-28 DIAGNOSIS — M170 Bilateral primary osteoarthritis of knee: Secondary | ICD-10-CM | POA: Diagnosis not present

## 2014-05-30 ENCOUNTER — Other Ambulatory Visit: Payer: Self-pay | Admitting: Endocrinology

## 2014-06-20 NOTE — Telephone Encounter (Signed)
error 

## 2014-06-25 DIAGNOSIS — L700 Acne vulgaris: Secondary | ICD-10-CM | POA: Diagnosis not present

## 2014-06-25 DIAGNOSIS — M170 Bilateral primary osteoarthritis of knee: Secondary | ICD-10-CM | POA: Diagnosis not present

## 2014-06-25 DIAGNOSIS — L820 Inflamed seborrheic keratosis: Secondary | ICD-10-CM | POA: Diagnosis not present

## 2014-06-25 DIAGNOSIS — D692 Other nonthrombocytopenic purpura: Secondary | ICD-10-CM | POA: Diagnosis not present

## 2014-07-02 ENCOUNTER — Other Ambulatory Visit: Payer: Self-pay | Admitting: Endocrinology

## 2014-07-02 NOTE — Telephone Encounter (Signed)
Please advise if ok to refill. 

## 2014-07-23 DIAGNOSIS — M170 Bilateral primary osteoarthritis of knee: Secondary | ICD-10-CM | POA: Diagnosis not present

## 2014-07-30 DIAGNOSIS — L718 Other rosacea: Secondary | ICD-10-CM | POA: Diagnosis not present

## 2014-07-30 DIAGNOSIS — H61002 Unspecified perichondritis of left external ear: Secondary | ICD-10-CM | POA: Diagnosis not present

## 2014-07-31 ENCOUNTER — Other Ambulatory Visit: Payer: Self-pay | Admitting: Endocrinology

## 2014-08-26 DIAGNOSIS — M170 Bilateral primary osteoarthritis of knee: Secondary | ICD-10-CM | POA: Diagnosis not present

## 2014-08-28 ENCOUNTER — Other Ambulatory Visit: Payer: Self-pay | Admitting: Endocrinology

## 2014-08-29 ENCOUNTER — Ambulatory Visit (INDEPENDENT_AMBULATORY_CARE_PROVIDER_SITE_OTHER): Payer: Medicare Other | Admitting: Endocrinology

## 2014-08-29 ENCOUNTER — Encounter: Payer: Self-pay | Admitting: Endocrinology

## 2014-08-29 VITALS — BP 120/62 | HR 54 | Temp 98.3°F | Resp 14 | Ht 68.0 in

## 2014-08-29 DIAGNOSIS — E785 Hyperlipidemia, unspecified: Secondary | ICD-10-CM | POA: Diagnosis not present

## 2014-08-29 DIAGNOSIS — E559 Vitamin D deficiency, unspecified: Secondary | ICD-10-CM

## 2014-08-29 DIAGNOSIS — E039 Hypothyroidism, unspecified: Secondary | ICD-10-CM

## 2014-08-29 DIAGNOSIS — M8580 Other specified disorders of bone density and structure, unspecified site: Secondary | ICD-10-CM

## 2014-08-29 DIAGNOSIS — R7309 Other abnormal glucose: Secondary | ICD-10-CM

## 2014-08-29 DIAGNOSIS — R7303 Prediabetes: Secondary | ICD-10-CM

## 2014-08-29 DIAGNOSIS — I10 Essential (primary) hypertension: Secondary | ICD-10-CM

## 2014-08-29 DIAGNOSIS — E876 Hypokalemia: Secondary | ICD-10-CM | POA: Diagnosis not present

## 2014-08-29 DIAGNOSIS — Z0000 Encounter for general adult medical examination without abnormal findings: Secondary | ICD-10-CM | POA: Diagnosis not present

## 2014-08-29 LAB — COMPREHENSIVE METABOLIC PANEL
ALT: 16 U/L (ref 0–35)
AST: 18 U/L (ref 0–37)
Albumin: 3.7 g/dL (ref 3.5–5.2)
Alkaline Phosphatase: 95 U/L (ref 39–117)
BUN: 13 mg/dL (ref 6–23)
CALCIUM: 8.4 mg/dL (ref 8.4–10.5)
CO2: 32 mEq/L (ref 19–32)
Chloride: 100 mEq/L (ref 96–112)
Creatinine, Ser: 0.47 mg/dL (ref 0.40–1.20)
GFR: 135.44 mL/min (ref >60.00)
GLUCOSE: 89 mg/dL (ref 70–99)
Potassium: 3.3 mEq/L — ABNORMAL LOW (ref 3.5–5.1)
Sodium: 139 mEq/L (ref 135–145)
Total Bilirubin: 0.9 mg/dL (ref 0.2–1.2)
Total Protein: 6.1 g/dL (ref 6.0–8.3)

## 2014-08-29 LAB — HEMOGLOBIN A1C: Hgb A1c MFr Bld: 5.4 % (ref 4.6–6.5)

## 2014-08-29 LAB — TSH: TSH: 1.19 u[IU]/mL (ref 0.35–4.50)

## 2014-08-29 LAB — LIPID PANEL
CHOLESTEROL: 228 mg/dL — AB (ref 0–200)
HDL: 48.5 mg/dL (ref >39.00)
LDL Cholesterol: 152 mg/dL — ABNORMAL HIGH (ref 0–99)
NONHDL: 179.5
Total CHOL/HDL Ratio: 5
Triglycerides: 139 mg/dL (ref 0.0–149.0)
VLDL: 27.8 mg/dL (ref 0.0–40.0)

## 2014-08-29 LAB — T4, FREE: FREE T4: 1.11 ng/dL (ref 0.60–1.60)

## 2014-08-29 LAB — CBC
HCT: 42.8 % (ref 36.0–46.0)
Hemoglobin: 14.1 g/dL (ref 12.0–15.0)
MCHC: 32.8 g/dL (ref 30.0–36.0)
MCV: 91.8 fl (ref 78.0–100.0)
Platelets: 256 10*3/uL (ref 150.0–400.0)
RBC: 4.67 Mil/uL (ref 3.87–5.11)
RDW: 13 % (ref 11.5–15.5)
WBC: 12.4 10*3/uL — ABNORMAL HIGH (ref 4.0–10.5)

## 2014-08-29 LAB — VITAMIN D 25 HYDROXY (VIT D DEFICIENCY, FRACTURES): VITD: 24.29 ng/mL — AB (ref 30.00–100.00)

## 2014-08-29 NOTE — Progress Notes (Addendum)
Patient ID: Angela [name], female   DOB: 1932-11-24, 81 y.o.   MRN: [id number]   Subjective:    Patient is being seen today for Medicare annual wellness visit and review of chronic problems.     Risk factors: Advancing age, obesity, hypertension    Roster of Physicians Providing Medical Care to Patient:  See "care team section"  Activities of Daily Living:  In the present state of health, the patient has no difficulty performing the following activities:  Preparing food and eating, Bathing, Getting dressed and taking care of daily personal  needs Patient is able to ambulate without feeling unsteady In the past year the patient has not fallen or had a near fall    Safety: Has smoke detector and wears seat belts. No excess sun exposure.  Diet and Exercise  Current exercise habits: Minimal, limited by osteoarthritis of knees Dietary issues discussed: heart healthy diet, she thinks she is generally compliant   PREVENTIVE screening/vaccines:  Annual hemoccults:           no   Breast self exams:                             yes   Colonoscopy/sigmoidoscopy ?  2013  Mammograms:  none   Yearly flu vaccine:                      yes   Bone Density:   2012   Calcium supplements:  Tetanus booster:    Zostavax:  2012  Pneumovax: 05/2007   Bone density done by orthopedic surgeon report, not available; previous T score -1.8 in 2012.  She will obtain report of this bone density   Depression Screen:  Results available in the depression screening section  Advance directives:  Details as in advance directives section  The following portions of the patient's history were reviewed and updated as appropriate: allergies, current medications, past family history, past medical history, past social history, past surgical history and problem list.  Most recent labs available were reviewed    Medication List       This list is accurate as of: 08/29/14  1:17 PM.  Always use your most recent med  list.               benazepril-hydrochlorthiazide 20-12.5 MG per tablet  Commonly known as:  LOTENSIN HCT  TAKE 1 TABLET ONCE DAILY.     isosorbide mononitrate 30 MG 24 hr tablet  Commonly known as:  IMDUR  TAKE 1 TABLET ONCE DAILY.     lisinopril 40 MG tablet  Commonly known as:  PRINIVIL,ZESTRIL  Take 1 tablet (40 mg total) by mouth daily.     PERCOCET 10-325 MG per tablet  Generic drug:  oxyCODONE-acetaminophen  Take 1 tablet by mouth as needed.     oxyCODONE-acetaminophen 5-325 MG per tablet  Commonly known as:  PERCOCET/ROXICET  Take 1 tablet by mouth every 4 (four) hours as needed. Pain     pantoprazole 40 MG tablet  Commonly known as:  PROTONIX  TAKE 1 TABLET ONCE DAILY.     polyethylene glycol packet  Commonly known as:  MIRALAX / GLYCOLAX  Take 17 g by mouth 3 (three) times daily as needed.     potassium chloride SA 20 MEQ tablet  Commonly known as:  K-DUR,KLOR-CON  TAKE 1 TABLET EACH DAY.     pravastatin 20 MG tablet  Commonly known as:  PRAVACHOL  Take 1 tablet (20 mg total) by mouth daily.     SYNTHROID 100 MCG tablet  Generic drug:  levothyroxine  TAKE 1 TABLET ONCE A DAY SIX DAYS A WEEK.     Vitamin D (Ergocalciferol) 50000 UNITS Caps capsule  Commonly known as:  DRISDOL  TAKE ONE CAPSULE ONCE A WEEK.     VOLTAREN 1 % Gel  Generic drug:  diclofenac sodium  Apply topically 4 (four) times daily. To knees        Allergies:  Allergies  Allergen Reactions  . Crestor [Rosuvastatin Calcium] Anaphylaxis    Mouth swelling  . Gadolinium      Desc: pt states she had nausea after receiving MAGNEVIST for breast imaging   . Penicillins Rash    Past Medical History  Diagnosis Date  . Hypertension   . Dyslipidemia   . Diabetes mellitus   . Arthritis   . GERD (gastroesophageal reflux disease)   . Heart murmur   . Breast cancer 1994    left sided cancer; unilateral mastectomy, no chemo or radiation  . BRCA2 positive 10/214    Past Surgical  History  Procedure Laterality Date  . Cardiac catheterization      NORMAL CORONARY ARTERIES  . Mastectomy      LEFT BREAST  . Cholecystectomy    . Tonsillectomy      Family History  Problem Relation Age of Onset  . Heart attack Maternal Grandmother   . Pancreatic cancer Mother 73  . Bladder Cancer Father 65    heavy smoker and drinker  . Ovarian cancer Sister 65  . Hypertension Sister   . Lung cancer Brother 36  . Ovarian cancer Maternal Aunt 62  . Stomach cancer Maternal Grandfather   . Bone cancer Paternal Grandmother     unsure if this was a primary cancer  . Stomach cancer Paternal Grandfather   . Lung cancer Maternal Aunt     died in her 61s; former smoker  . Breast cancer Cousin     dx in her late 67s to 78s; paternal cousin    Social History:  reports that she has never smoked. She has never used smokeless tobacco. She reports that she does not drink alcohol or use illicit drugs.   Review of Systems:   Denies hearing loss, and visual loss Has better vision after cataract Does not have any changes in appetite or significant weight change  Objective:    BP 120/62 mmHg  Pulse 54  Temp(Src) 98.3 F (36.8 C)  Resp 14  Ht '5\' 8"'  (1.727 m)  Wt   SpO2 96%  Vision:  Normal, has annual eye exams   Hearing: grossly normal Body mass index:  See vitals Neurological/balance: pt easily and quickly performs "get-up-and-go" from a sitting position Cognitive Impairment Assessment: cognition, memory, orientation and judgment appear normal.   Has excellent recall.   Assessment:   Medicare wellness evaluation done   Preventive parameters reviewed  All immunizations updated   Plan:   During the course of the visit the patient was educated and counseled about appropriate screening and preventive services including:       Fall prevention   Screening mammography/MRI scan for breast evaluation  Bone densitometry screening  Diabetes follow-up to be done today Healthy  meal plan discussed   Exercise recommended as tolerated  Lipid screening Colorectal screening with Hemoccult  Regular eye and dental exams recommended Patient will get copy of living  will for record  Vaccines / LABS Zostavax / Pneumococcal Vaccine/Prevnar up-to-date   Patient Instructions (the written plan) was given to the patient.   [facility] 08/29/2014           OFFICE VISIT done separately for current and active problems:      1.  HYPERLIPIDEMIA:   History: She has had long-standing hyperlipidemia Previously had taken Crestor. She is afraid of possible side effects including liver effects and ? Abdominal discomfort.  She has been trying pravastatin since her last visit although has not taken this daily, probably 2 or 3 times a week.  She is afraid of GI side effects and takes that at night but so far has tolerated this well  Highest LDL has been  over 200   Lipids to be repeated today, previous LDL 169, will discuss results when available Discussed importance of lipid control especially with her mild diabetes   2. Prediabetes:  This was diagnosed in 2008 with fasting glucose of 117 and a two-hour glucose of 177.  She had been on metformin but not since she had problems with nausea and abdominal pain in 2014. Not able to exercise much because of her osteoarthritis in the knees  She does not allow her weight to be taken.  She is usually watching her diet with portion control and low fat choices.  Blood sugar fasting is normal at home but did not bring her monitor; she thinks her readings are usually below 100 fasting; previously A1c is usually below 6%.  She may have blood sugars as high as 141  if getting intra-articular steroids, last injection was on Monday.   Labs pending   3.Hypertension: She has had long-standing hypertension which has been well controlled usually.  Does not watch blood pressure at home  Currently on Lotensin HCT.  Has had issues with hypokalemia  supplemented with potassium Her last potassium was 3.1 and she was given potassium for 1 month and she has not refilled it Recent Labs pending    4.  HYPOTHYROIDISM:  HISTORY: She has had long-standing primary hypothyroidism.  She has been on consistent  dosage.  with levothyroxine  100 g, 6 days/week Her energy level is fairly again ; she is quite compliant with taking her medication in the morning and her TSH is pending      5.  Sinus bradycardia noticed on exam today: Will check EKG    Edell Mesenbrink   ADDENDUM:  Labs show significantly high cholesterol, potassium is still low She needs to restart potassium and also take her pravastatin at least 5 days a week Vitamin D level low, need to make sure she is taking her weekly dosage regularly Thyroid okay, to continue same dose EKG shows occasional PVC and PAC with sinus bradycardia Patient informed of the results

## 2014-08-29 NOTE — Patient Instructions (Addendum)
Periodic MRI of breasts as discussed Check breasts for lumps monthly with the palm of the hand  Take Vitamin D weekly and 500 mg of calcium daily  Low saturated fat diet, low intake of sugar and salt  Regular aerobic exercise 5 days a week  Regular dental and eye exams as recommended by the specialists  Make sure smoke detectors are functional at home Wear seat belts all the time

## 2014-08-31 NOTE — Progress Notes (Signed)
Quick Note:  Labs show significantly high cholesterol, potassium is still low She needs to restart potassium and also take her pravastatin at least 5 days a week Vitamin D level low, need to make sure she is taking her weekly dosage regularly. If she is then will need to switch her to vitamin D3 5000 units daily Thyroid okay ______

## 2014-09-12 ENCOUNTER — Other Ambulatory Visit: Payer: Self-pay | Admitting: Endocrinology

## 2014-09-24 DIAGNOSIS — M25512 Pain in left shoulder: Secondary | ICD-10-CM | POA: Diagnosis not present

## 2014-09-24 DIAGNOSIS — M170 Bilateral primary osteoarthritis of knee: Secondary | ICD-10-CM | POA: Diagnosis not present

## 2014-09-27 ENCOUNTER — Other Ambulatory Visit: Payer: Self-pay | Admitting: Endocrinology

## 2014-10-22 DIAGNOSIS — M170 Bilateral primary osteoarthritis of knee: Secondary | ICD-10-CM | POA: Diagnosis not present

## 2014-10-29 ENCOUNTER — Ambulatory Visit: Payer: Medicare Other | Admitting: Oncology

## 2014-10-29 ENCOUNTER — Other Ambulatory Visit: Payer: Medicare Other

## 2014-10-29 DIAGNOSIS — L820 Inflamed seborrheic keratosis: Secondary | ICD-10-CM | POA: Diagnosis not present

## 2014-10-29 DIAGNOSIS — L821 Other seborrheic keratosis: Secondary | ICD-10-CM | POA: Diagnosis not present

## 2014-10-29 DIAGNOSIS — H61002 Unspecified perichondritis of left external ear: Secondary | ICD-10-CM | POA: Diagnosis not present

## 2014-10-29 DIAGNOSIS — L718 Other rosacea: Secondary | ICD-10-CM | POA: Diagnosis not present

## 2014-10-29 DIAGNOSIS — L723 Sebaceous cyst: Secondary | ICD-10-CM | POA: Diagnosis not present

## 2014-10-31 ENCOUNTER — Telehealth: Payer: Self-pay | Admitting: Oncology

## 2014-10-31 NOTE — Telephone Encounter (Signed)
S/w pt confirming updated D/T labs/ov pt missed apt.... Mailed out schedule per pt's request... KJ

## 2014-11-04 ENCOUNTER — Other Ambulatory Visit: Payer: Self-pay | Admitting: Endocrinology

## 2014-11-19 DIAGNOSIS — M170 Bilateral primary osteoarthritis of knee: Secondary | ICD-10-CM | POA: Diagnosis not present

## 2014-11-21 ENCOUNTER — Encounter: Payer: Self-pay | Admitting: Endocrinology

## 2014-11-26 ENCOUNTER — Telehealth: Payer: Self-pay | Admitting: Endocrinology

## 2014-11-26 ENCOUNTER — Other Ambulatory Visit: Payer: Self-pay | Admitting: *Deleted

## 2014-11-26 NOTE — Telephone Encounter (Signed)
Pt needs a call back about one of her meds please

## 2014-11-26 NOTE — Telephone Encounter (Signed)
error 

## 2014-11-28 ENCOUNTER — Encounter: Payer: Self-pay | Admitting: Physician Assistant

## 2014-11-28 ENCOUNTER — Ambulatory Visit: Payer: Medicare Other | Admitting: Endocrinology

## 2014-11-28 ENCOUNTER — Other Ambulatory Visit: Payer: Medicare Other

## 2014-12-04 ENCOUNTER — Other Ambulatory Visit: Payer: Self-pay | Admitting: Endocrinology

## 2014-12-17 DIAGNOSIS — M170 Bilateral primary osteoarthritis of knee: Secondary | ICD-10-CM | POA: Diagnosis not present

## 2014-12-23 ENCOUNTER — Other Ambulatory Visit: Payer: Self-pay | Admitting: *Deleted

## 2014-12-23 DIAGNOSIS — D72829 Elevated white blood cell count, unspecified: Secondary | ICD-10-CM | POA: Insufficient documentation

## 2014-12-24 ENCOUNTER — Ambulatory Visit (HOSPITAL_BASED_OUTPATIENT_CLINIC_OR_DEPARTMENT_OTHER): Payer: Medicare Other | Admitting: Oncology

## 2014-12-24 ENCOUNTER — Other Ambulatory Visit (HOSPITAL_BASED_OUTPATIENT_CLINIC_OR_DEPARTMENT_OTHER): Payer: Medicare Other

## 2014-12-24 ENCOUNTER — Ambulatory Visit: Payer: Medicare Other | Admitting: Oncology

## 2014-12-24 ENCOUNTER — Telehealth: Payer: Self-pay | Admitting: Oncology

## 2014-12-24 VITALS — BP 164/67 | HR 62 | Temp 97.9°F | Resp 18 | Ht 68.0 in

## 2014-12-24 DIAGNOSIS — D72829 Elevated white blood cell count, unspecified: Secondary | ICD-10-CM | POA: Diagnosis not present

## 2014-12-24 DIAGNOSIS — Z853 Personal history of malignant neoplasm of breast: Secondary | ICD-10-CM

## 2014-12-24 LAB — CBC WITH DIFFERENTIAL/PLATELET
BASO%: 0.8 % (ref 0.0–2.0)
BASOS ABS: 0.1 10*3/uL (ref 0.0–0.1)
EOS ABS: 0.1 10*3/uL (ref 0.0–0.5)
EOS%: 0.4 % (ref 0.0–7.0)
HCT: 44.4 % (ref 34.8–46.6)
HGB: 15 g/dL (ref 11.6–15.9)
LYMPH#: 2.4 10*3/uL (ref 0.9–3.3)
LYMPH%: 17.6 % (ref 14.0–49.7)
MCH: 30.1 pg (ref 25.1–34.0)
MCHC: 33.8 g/dL (ref 31.5–36.0)
MCV: 88.9 fL (ref 79.5–101.0)
MONO#: 1 10*3/uL — AB (ref 0.1–0.9)
MONO%: 7.7 % (ref 0.0–14.0)
NEUT#: 9.9 10*3/uL — ABNORMAL HIGH (ref 1.5–6.5)
NEUT%: 73.5 % (ref 38.4–76.8)
Platelets: 275 10*3/uL (ref 145–400)
RBC: 4.99 10*6/uL (ref 3.70–5.45)
RDW: 13.4 % (ref 11.2–14.5)
WBC: 13.5 10*3/uL — ABNORMAL HIGH (ref 3.9–10.3)

## 2014-12-24 NOTE — Telephone Encounter (Signed)
lvm fo rpt regarding to OCT 2017 appt....mailed pt appt sched/avs and letter

## 2014-12-24 NOTE — Progress Notes (Signed)
  [facility] OFFICE PROGRESS NOTE   Diagnosis: Leukocytosis, history of breast cancer  INTERVAL HISTORY:   She returns as scheduled. She reports feeling well. She has nausea after eating fried foods. No other complaint.  Objective:  Vital signs in last 24 hours:  Blood pressure 164/67, pulse 62, temperature 97.9 F (36.6 C), temperature source Oral, resp. rate 18, height 5' 8" (1.727 m), SpO2 100 %.    HEENT: Neck without mass Lymphatics: No cervical, supra-clavicular, axillary, or inguinal nodes Resp: Lungs clear bilaterally Cardio: Regular rate and rhythm GI: No hepatosplenomegaly, no mass, no apparent ascites Vascular: No leg edema Breasts: Right breast with an implant and no mass,  left mastectomy with implant in place. No evidence for chest wall tumor recurrence.   Lab Results:  Lab Results  Component Value Date   WBC 13.5* 12/24/2014   HGB 15.0 12/24/2014   HCT 44.4 12/24/2014   MCV 88.9 12/24/2014   PLT 275 12/24/2014   NEUTROABS 9.9* 12/24/2014    BCR/ABL-negative on 02/28/2014   Medications: I have reviewed the patient's current medications.  Assessment/Plan: 1. Leukocytosis-mild neutrophilia 2. Knee arthritis 3. BRCA2 carrier 4. Remote history of breast cancer 5. Hypothyroidism 6. Hyperlipidemia 7. Family history of multiple cancers    Disposition:  She is stable from a hematologic standpoint. The mild neutrophilia could indicate an underlying myeloproliferative disorder. A peripheral blood PCR was negative for CML last year. She would like to continue follow-up at the [facility]. She will return for an office visit and CBC in one year. She continues MRI surveillance for breast cancer. I encouraged her to alert family members of the BRCA2 diagnosis.  Angela Coder, MD  12/24/2014  11:50 AM

## 2014-12-25 ENCOUNTER — Ambulatory Visit (INDEPENDENT_AMBULATORY_CARE_PROVIDER_SITE_OTHER): Payer: Medicare Other | Admitting: Physician Assistant

## 2014-12-25 ENCOUNTER — Ambulatory Visit: Payer: Medicare Other | Admitting: Physician Assistant

## 2014-12-25 ENCOUNTER — Encounter: Payer: Self-pay | Admitting: Physician Assistant

## 2014-12-25 VITALS — BP 120/68 | HR 84 | Ht 68.0 in

## 2014-12-25 DIAGNOSIS — R072 Precordial pain: Secondary | ICD-10-CM

## 2014-12-25 DIAGNOSIS — I10 Essential (primary) hypertension: Secondary | ICD-10-CM

## 2014-12-25 DIAGNOSIS — E785 Hyperlipidemia, unspecified: Secondary | ICD-10-CM | POA: Diagnosis not present

## 2014-12-25 NOTE — Progress Notes (Signed)
Patient ID: Angela [name], female   DOB: 1932/06/12, 81 y.o.   MRN: [id number]    Date:  12/25/2014   ID:  Angela [name], DOB October 20, 1932, MRN [id number]  PCP:  [name], MD  Primary Cardiologist:  [place]   Chief Complaint  Patient presents with  . Follow-up    One recent episode of chest pain upon waking took 2 aspirin lasted 15-20 minutes with no radiation.     History of Present Illness: Angela [name] is a 81 y.o. female history of hypertension, dyslipidemia, diabetes mellitus, arthritis, GERD and breast cancer.  Her last echocardiogram was 09/11/2012 and her ejection fraction was 60% with no regional wall motion abnormalities. She had trivial aortic valve regurgitation in the left atrium was mildly dilated.  She's had a cardiac catheterization in the past, possibly 5-6 years ago, which was normal.  Patient reports that approximately 2-3 weeks ago she woke up with EF of 10 chest pressure which she says felt like "something pushing on the". She felt dizzy and sweaty. It lasted for approximately 2-3 hours but was easing off throughout the morning. She had she may have had some minor episodes since however she is a relatively poor historian when it comes to talking about her symptoms she's had.  The patient currently denies nausea, vomiting, fever, chest pain, shortness of breath, orthopnea, dizziness, PND, cough, congestion, abdominal pain, hematochezia, melena, lower extremity edema, claudication.  Wt Readings from Last 3 Encounters:  09/01/12 97.886 kg (215 lb 12.8 oz)  09/17/10 106.595 kg (235 lb)     Past Medical History  Diagnosis Date  . Hypertension   . Dyslipidemia   . Diabetes mellitus   . Arthritis   . GERD (gastroesophageal reflux disease)   . Heart murmur   . Breast cancer ([place]) 1994    left sided cancer; unilateral mastectomy, no chemo or radiation  . BRCA2 positive 10/214    Current Outpatient Prescriptions  Medication Sig Dispense Refill  .  benazepril-hydrochlorthiazide (LOTENSIN HCT) 20-12.5 MG per tablet TAKE 1 TABLET ONCE DAILY. 30 tablet 5  . isosorbide mononitrate (IMDUR) 30 MG 24 hr tablet TAKE 1 TABLET ONCE DAILY. 30 tablet 5  . pantoprazole (PROTONIX) 40 MG tablet TAKE 1 TABLET ONCE DAILY. 30 tablet 5  . PERCOCET 10-325 MG per tablet Take 1 tablet by mouth as needed.    . polyethylene glycol (MIRALAX / GLYCOLAX) packet Take 17 g by mouth 3 (three) times daily as needed. 30 each 0  . potassium chloride SA (K-DUR,KLOR-CON) 20 MEQ tablet TAKE 1 TABLET EACH DAY. 30 tablet 5  . pravastatin (PRAVACHOL) 20 MG tablet TAKE 1 TABLET ONCE DAILY. 30 tablet 5  . SYNTHROID 100 MCG tablet TAKE 1 TABLET ONCE A DAY SIX DAYS A WEEK. 78 tablet 1  . Vitamin D, Ergocalciferol, (DRISDOL) 50000 UNITS CAPS capsule TAKE ONE CAPSULE ONCE A WEEK. 4 capsule 5  . VOLTAREN 1 % GEL Apply topically 4 (four) times daily. To knees     No current facility-administered medications for this visit.    Allergies:    Allergies  Allergen Reactions  . Gadolinium      Desc: pt states she had nausea after receiving MAGNEVIST for breast imaging   . Crestor [Rosuvastatin Calcium] Other (See Comments)    Abdominal discomfort  . Penicillins Rash    Social History:  The patient  reports that she has never smoked. She has never used smokeless tobacco. She reports that she  does not drink alcohol or use illicit drugs.   Family history:   Family History  Problem Relation Age of Onset  . Heart attack Maternal Grandmother   . Pancreatic cancer Mother 54  . Bladder Cancer Father 59    heavy smoker and drinker  . Ovarian cancer Sister 83  . Hypertension Sister   . Lung cancer Brother 41  . Ovarian cancer Maternal Aunt 62  . Stomach cancer Maternal Grandfather   . Bone cancer Paternal Grandmother     unsure if this was a primary cancer  . Stomach cancer Paternal Grandfather   . Lung cancer Maternal Aunt     died in her 66s; former smoker  . Breast cancer  Cousin     dx in her late 63s to 24s; paternal cousin    ROS:  Please see the history of present illness.  All other systems reviewed and negative.   PHYSICAL EXAM: VS:  BP 120/68 mmHg  Pulse 84  Ht _0  (1.727 m)  Wt  Obese well developed, in no acute distress HEENT: Pupils are equal round react to light accommodation extraocular movements are intact.  Neck: no JVDNo cervical lymphadenopathy. Cardiac: Regular rate and rhythm 1/6 systolic murmur Lungs:  clear to auscultation bilaterally, no wheezing, rhonchi or rales Abd: soft, nontender, positive bowel sounds all quadrants, no hepatosplenomegaly Ext: no lower extremity edema.  2+ radial and dorsalis pedis pulses. Skin: warm and dry Neuro:  Grossly normal  EKG:  Sinus rhythm rate 84 bpm left anterior fascicular block and possible atrial enlargement.   ASSESSMENT AND PLAN:  Problem List Items Addressed This Visit    Hypertension   Dyslipidemia   Chest pain - Primary   Relevant Orders   EKG 12-Lead   Myocardial Perfusion Imaging      Chest pain Order Lexiscan Cardiolite and follow-up with Dr. [place] in 3 months.  Lipids are poorly controlled and she's been intolerant to statins in the past and just reluctant to take medication in general.  She also has diabetes and is obese. No acute changes on her EKG compared to 2014  Essential hypertension  Well-controlled no changes medications. Hyperlipidemia  Continue statin as much as she'll tolerate.

## 2014-12-25 NOTE — Patient Instructions (Signed)
Schedule [place]    Your physician wants you to follow-up in: 03/2015. You will receive a reminder letter in the mail two months in advance. If you don't receive a letter, please call our office to schedule the follow-up appointment.

## 2015-01-02 ENCOUNTER — Telehealth (HOSPITAL_COMMUNITY): Payer: Self-pay

## 2015-01-02 NOTE — Telephone Encounter (Signed)
Encounter complete. 

## 2015-01-07 ENCOUNTER — Ambulatory Visit (HOSPITAL_COMMUNITY)
Admission: RE | Admit: 2015-01-07 | Discharge: 2015-01-07 | Disposition: A | Discharge status: Home / Self Care | Payer: Medicare Other | Source: Ambulatory Visit | Attending: Cardiovascular Disease | Admitting: Cardiovascular Disease

## 2015-01-07 DIAGNOSIS — Z8249 Family history of ischemic heart disease and other diseases of the circulatory system: Secondary | ICD-10-CM | POA: Diagnosis not present

## 2015-01-07 DIAGNOSIS — R5383 Other fatigue: Secondary | ICD-10-CM | POA: Diagnosis not present

## 2015-01-07 DIAGNOSIS — I10 Essential (primary) hypertension: Secondary | ICD-10-CM | POA: Insufficient documentation

## 2015-01-07 DIAGNOSIS — R072 Precordial pain: Secondary | ICD-10-CM | POA: Diagnosis not present

## 2015-01-07 DIAGNOSIS — R0602 Shortness of breath: Secondary | ICD-10-CM | POA: Insufficient documentation

## 2015-01-07 DIAGNOSIS — E669 Obesity, unspecified: Secondary | ICD-10-CM | POA: Insufficient documentation

## 2015-01-07 DIAGNOSIS — Z6832 Body mass index (BMI) 32.0-32.9, adult: Secondary | ICD-10-CM | POA: Insufficient documentation

## 2015-01-07 LAB — MYOCARDIAL PERFUSION IMAGING
CHL CUP NUCLEAR SDS: 3
CHL CUP NUCLEAR SSS: 5
LV dias vol: 49 mL
LV sys vol: 13 mL
Peak HR: 82 {beats}/min
Rest HR: 71 {beats}/min
SRS: 2
TID: 0.99

## 2015-01-07 MED ORDER — REGADENOSON 0.4 MG/5ML IV SOLN
0.4000 mg | Freq: Once | INTRAVENOUS | Status: AC
  Administered 2015-01-07: 0.4 mg via INTRAVENOUS

## 2015-01-07 MED ORDER — TECHNETIUM TC 99M SESTAMIBI GENERIC - CARDIOLITE
30.9000 | Freq: Once | INTRAVENOUS | Status: AC | PRN
  Administered 2015-01-07: 30.9 via INTRAVENOUS

## 2015-01-07 MED ORDER — TECHNETIUM TC 99M SESTAMIBI GENERIC - CARDIOLITE
10.8000 | Freq: Once | INTRAVENOUS | Status: AC | PRN
  Administered 2015-01-07: 10.8 via INTRAVENOUS

## 2015-01-08 ENCOUNTER — Telehealth: Payer: Self-pay

## 2015-01-08 NOTE — Telephone Encounter (Signed)
Received a call from patient.She stated someone from this office called her and left a message on her voice mail,she cannot understand message.After reviewing chart recent myoview normal.Stated she has been having chest tightness today off and on.No chest tightness at present.Appointment scheduled with Dr.Jordan 04/14/14 at 3:15 pm.Advised to call back if she continues to have chest tightness.

## 2015-01-14 DIAGNOSIS — M170 Bilateral primary osteoarthritis of knee: Secondary | ICD-10-CM | POA: Diagnosis not present

## 2015-01-23 ENCOUNTER — Other Ambulatory Visit: Payer: Self-pay | Admitting: *Deleted

## 2015-01-23 ENCOUNTER — Telehealth: Payer: Self-pay | Admitting: *Deleted

## 2015-01-23 MED ORDER — SULFAMETHOXAZOLE-TRIMETHOPRIM 800-160 MG PO TABS: 1.0000 | ORAL_TABLET | Freq: Two times a day (BID) | ORAL | Status: DC

## 2015-01-23 MED ORDER — NORFLOXACIN 400 MG PO TABS: 400.0000 mg | ORAL_TABLET | Freq: Two times a day (BID) | ORAL | Status: DC

## 2015-01-23 NOTE — Telephone Encounter (Signed)
Noroxin 400 mg twice a day, 6 tablets.  She also needs to be seen for follow-up visit including lab, no appointment has been scheduled

## 2015-01-23 NOTE — Telephone Encounter (Signed)
[name] said she has no way of getting here to bring it. Please advise.

## 2015-01-23 NOTE — Telephone Encounter (Signed)
Please have her bring the urine sample this morning so that we can have the report by this afternoon

## 2015-01-23 NOTE — Telephone Encounter (Signed)
Noted, rx sent, patient is aware. 

## 2015-01-23 NOTE — Telephone Encounter (Signed)
[name],  Patient is complaining about a UTI, she's urinating a lot and has burning and itching for about one week. She wants to know if you can call in an antibiotic for her? Please advise.

## 2015-01-28 ENCOUNTER — Ambulatory Visit (INDEPENDENT_AMBULATORY_CARE_PROVIDER_SITE_OTHER): Payer: Medicare Other

## 2015-01-28 DIAGNOSIS — Z23 Encounter for immunization: Secondary | ICD-10-CM | POA: Diagnosis not present

## 2015-01-30 ENCOUNTER — Other Ambulatory Visit: Payer: Self-pay | Admitting: *Deleted

## 2015-01-30 MED ORDER — POTASSIUM CHLORIDE ER 10 MEQ PO TBCR: EXTENDED_RELEASE_TABLET | ORAL | Status: DC

## 2015-02-04 ENCOUNTER — Other Ambulatory Visit: Payer: Self-pay | Admitting: Endocrinology

## 2015-02-07 ENCOUNTER — Other Ambulatory Visit: Payer: Self-pay | Admitting: Endocrinology

## 2015-02-07 ENCOUNTER — Telehealth: Payer: Self-pay | Admitting: *Deleted

## 2015-02-07 ENCOUNTER — Other Ambulatory Visit: Payer: Self-pay | Admitting: *Deleted

## 2015-02-07 MED ORDER — CIPROFLOXACIN HCL 500 MG PO TABS: 500.0000 mg | ORAL_TABLET | Freq: Two times a day (BID) | ORAL | Status: DC

## 2015-02-07 NOTE — Telephone Encounter (Signed)
Noted, rx sent patient is aware 

## 2015-02-07 NOTE — Telephone Encounter (Signed)
Cipro 500bid #10 

## 2015-02-07 NOTE — Telephone Encounter (Signed)
[name] called this morning, she is still having UTI symptoms, she's up every few minutes going to the bathroom with very little results, she's having burning, itching, frequent trips to the bathroom. She wants to know if she could have something called in, this has been going on for weeks, she is unable to come here to leave a specimen because she does not have a ride. Please advise.

## 2015-02-11 DIAGNOSIS — M170 Bilateral primary osteoarthritis of knee: Secondary | ICD-10-CM | POA: Diagnosis not present

## 2015-02-11 DIAGNOSIS — M25512 Pain in left shoulder: Secondary | ICD-10-CM | POA: Diagnosis not present

## 2015-03-05 ENCOUNTER — Other Ambulatory Visit: Payer: Self-pay | Admitting: Endocrinology

## 2015-03-11 DIAGNOSIS — M170 Bilateral primary osteoarthritis of knee: Secondary | ICD-10-CM | POA: Diagnosis not present

## 2015-03-25 ENCOUNTER — Other Ambulatory Visit: Payer: Self-pay | Admitting: *Deleted

## 2015-03-25 ENCOUNTER — Other Ambulatory Visit (INDEPENDENT_AMBULATORY_CARE_PROVIDER_SITE_OTHER): Payer: Medicare Other

## 2015-03-25 DIAGNOSIS — E119 Type 2 diabetes mellitus without complications: Secondary | ICD-10-CM

## 2015-03-25 DIAGNOSIS — E559 Vitamin D deficiency, unspecified: Secondary | ICD-10-CM

## 2015-03-25 DIAGNOSIS — N390 Urinary tract infection, site not specified: Secondary | ICD-10-CM | POA: Diagnosis not present

## 2015-03-25 DIAGNOSIS — I10 Essential (primary) hypertension: Secondary | ICD-10-CM | POA: Diagnosis not present

## 2015-03-25 LAB — COMPREHENSIVE METABOLIC PANEL
ALBUMIN: 4.1 g/dL (ref 3.5–5.2)
ALK PHOS: 109 U/L (ref 39–117)
ALT: 15 U/L (ref 0–35)
AST: 16 U/L (ref 0–37)
BILIRUBIN TOTAL: 1 mg/dL (ref 0.2–1.2)
BUN: 18 mg/dL (ref 6–23)
CO2: 33 mEq/L — ABNORMAL HIGH (ref 19–32)
Calcium: 9.5 mg/dL (ref 8.4–10.5)
Chloride: 100 mEq/L (ref 96–112)
Creatinine, Ser: 0.55 mg/dL (ref 0.40–1.20)
GFR: 112.81 mL/min (ref >60.00)
GLUCOSE: 90 mg/dL (ref 70–99)
POTASSIUM: 4.1 meq/L (ref 3.5–5.1)
SODIUM: 141 meq/L (ref 135–145)
TOTAL PROTEIN: 6.9 g/dL (ref 6.0–8.3)

## 2015-03-25 LAB — CBC WITH DIFFERENTIAL/PLATELET
BASOS ABS: 0 10*3/uL (ref 0.0–0.1)
Basophils Relative: 0.3 % (ref 0.0–3.0)
Eosinophils Absolute: 0.1 10*3/uL (ref 0.0–0.7)
Eosinophils Relative: 0.7 % (ref 0.0–5.0)
HCT: 49.7 % — ABNORMAL HIGH (ref 36.0–46.0)
HEMOGLOBIN: 16.3 g/dL — AB (ref 12.0–15.0)
LYMPHS ABS: 3 10*3/uL (ref 0.7–4.0)
Lymphocytes Relative: 16.9 % (ref 12.0–46.0)
MCHC: 32.7 g/dL (ref 30.0–36.0)
MCV: 92.8 fl (ref 78.0–100.0)
MONO ABS: 1.1 10*3/uL — AB (ref 0.1–1.0)
Monocytes Relative: 6 % (ref 3.0–12.0)
NEUTROS PCT: 76.1 % (ref 43.0–77.0)
Neutro Abs: 13.7 10*3/uL — ABNORMAL HIGH (ref 1.4–7.7)
Platelets: 290 10*3/uL (ref 150.0–400.0)
RBC: 5.36 Mil/uL — AB (ref 3.87–5.11)
RDW: 13.9 % (ref 11.5–15.5)

## 2015-03-25 LAB — VITAMIN D 25 HYDROXY (VIT D DEFICIENCY, FRACTURES): VITD: 39.09 ng/mL (ref 30.00–100.00)

## 2015-03-25 LAB — URINALYSIS, ROUTINE W REFLEX MICROSCOPIC
BILIRUBIN URINE: NEGATIVE
HGB URINE DIPSTICK: NEGATIVE
Ketones, ur: NEGATIVE
NITRITE: NEGATIVE
Specific Gravity, Urine: 1.025 (ref 1.000–1.030)
TOTAL PROTEIN, URINE-UPE24: NEGATIVE
Urine Glucose: NEGATIVE
Urobilinogen, UA: 0.2 (ref 0.0–1.0)
pH: 6 (ref 5.0–8.0)

## 2015-03-25 LAB — LIPID PANEL
CHOLESTEROL: 246 mg/dL — AB (ref 0–200)
HDL: 58.6 mg/dL (ref >39.00)
LDL Cholesterol: 167 mg/dL — ABNORMAL HIGH (ref 0–99)
NonHDL: 187.67
TRIGLYCERIDES: 101 mg/dL (ref 0.0–149.0)
Total CHOL/HDL Ratio: 4
VLDL: 20.2 mg/dL (ref 0.0–40.0)

## 2015-03-25 NOTE — Progress Notes (Signed)
Quick Note:  Please let patient know that the urine result is normal and no further action needed Has abnormal WBC and needs to be seen, also need TSH for hypothyroidism ______

## 2015-03-26 ENCOUNTER — Telehealth: Payer: Self-pay | Admitting: Endocrinology

## 2015-03-26 ENCOUNTER — Other Ambulatory Visit (INDEPENDENT_AMBULATORY_CARE_PROVIDER_SITE_OTHER): Payer: Medicare Other

## 2015-03-26 DIAGNOSIS — E039 Hypothyroidism, unspecified: Secondary | ICD-10-CM | POA: Diagnosis not present

## 2015-03-26 DIAGNOSIS — I519 Heart disease, unspecified: Principal | ICD-10-CM

## 2015-03-26 LAB — TSH: TSH: 1.78 u[IU]/mL (ref 0.35–4.50)

## 2015-03-26 NOTE — Telephone Encounter (Signed)
Please see result note 

## 2015-03-26 NOTE — Telephone Encounter (Signed)
Patient Name: Angela [name] Gender: Female DOB: August 25, 1932 Age: 82 Y 9 M 16 D Return Phone Number: Address: City/State/Zip: [place] [employer] Endocrinology Night - Client Client Site [facility] [place], [place] Type Call Call Type Page Only Caller Name Hope Relationship To Patient Provider Is this call to report lab results? Yes Tech Name [facility] Name of Lab [facility] Phone Number [phone] Lab Reference Number na Return Phone Number Please choose phone number Initial Comment Caller states [facility]/ [facility] has a critical lab. [phone]

## 2015-03-27 LAB — URINE CULTURE

## 2015-04-01 ENCOUNTER — Ambulatory Visit: Payer: Medicare Other | Admitting: Endocrinology

## 2015-04-02 ENCOUNTER — Other Ambulatory Visit: Payer: Self-pay | Admitting: Endocrinology

## 2015-04-04 ENCOUNTER — Encounter: Payer: Self-pay | Admitting: Endocrinology

## 2015-04-04 ENCOUNTER — Ambulatory Visit (INDEPENDENT_AMBULATORY_CARE_PROVIDER_SITE_OTHER): Payer: Medicare Other | Admitting: Endocrinology

## 2015-04-04 ENCOUNTER — Other Ambulatory Visit: Payer: Self-pay | Admitting: Endocrinology

## 2015-04-04 VITALS — BP 126/78 | Temp 97.8°F

## 2015-04-04 DIAGNOSIS — R7301 Impaired fasting glucose: Secondary | ICD-10-CM | POA: Diagnosis not present

## 2015-04-04 DIAGNOSIS — D72829 Elevated white blood cell count, unspecified: Secondary | ICD-10-CM

## 2015-04-04 DIAGNOSIS — E039 Hypothyroidism, unspecified: Secondary | ICD-10-CM

## 2015-04-04 DIAGNOSIS — E785 Hyperlipidemia, unspecified: Secondary | ICD-10-CM

## 2015-04-04 DIAGNOSIS — E559 Vitamin D deficiency, unspecified: Secondary | ICD-10-CM

## 2015-04-04 LAB — CBC WITH DIFFERENTIAL/PLATELET
Basophils Absolute: 0.1 10*3/uL (ref 0.0–0.1)
Basophils Relative: 0.5 % (ref 0.0–3.0)
EOS ABS: 0.2 10*3/uL (ref 0.0–0.7)
EOS PCT: 1.4 % (ref 0.0–5.0)
HCT: 47.2 % — ABNORMAL HIGH (ref 36.0–46.0)
HEMOGLOBIN: 15.4 g/dL — AB (ref 12.0–15.0)
LYMPHS ABS: 3.1 10*3/uL (ref 0.7–4.0)
Lymphocytes Relative: 21.1 % (ref 12.0–46.0)
MCHC: 32.5 g/dL (ref 30.0–36.0)
MCV: 92.3 fl (ref 78.0–100.0)
MONO ABS: 1.2 10*3/uL — AB (ref 0.1–1.0)
Monocytes Relative: 8.4 % (ref 3.0–12.0)
NEUTROS PCT: 68.6 % (ref 43.0–77.0)
Neutro Abs: 10.1 10*3/uL — ABNORMAL HIGH (ref 1.4–7.7)
Platelets: 218 10*3/uL (ref 150.0–400.0)
RBC: 5.11 Mil/uL (ref 3.87–5.11)
RDW: 13.6 % (ref 11.5–15.5)
WBC: 14.8 10*3/uL — AB (ref 4.0–10.5)

## 2015-04-04 NOTE — Progress Notes (Signed)
Patient ID: Angela [name], female   DOB: April 05, 1932, 82 y.o.   MRN: [id number]    Chief complaint: Followup for various problems  History of Present Illness:   LEUKOCYTOSIS:  She has had mild chronic leukocytosis. Her leukocytosis was felt to be unrelated to pathological processes by hematologist However her white count was over 17,000 without any active infections or symptoms of fever or chills Also has a relatively higher hematocrit She still feels fairly good overall except for mild chronic tiredness  Lab Results  Component Value Date   WBC 17.9 Repeated and verified X2.* 03/25/2015   HGB 16.3* 03/25/2015   HCT 49.7* 03/25/2015   MCV 92.8 03/25/2015   PLT 290.0 03/25/2015     Primary hypothyroidism: She has had long-standing primary hypothyroidism. She has been on consistent  dosage.  On 100 g, 6 days/week Her energy level is not very good with some time and also is having some difficulty sleeping.  She is quite compliant with taking her medication in the morning and her TSH is again normal      Lab Results  Component Value Date   TSH 1.78 03/26/2015   TSH 1.19 08/29/2014   TSH 2.71 04/30/2014   FREET4 1.11 08/29/2014   FREET4 1.23 04/30/2014   FREET4 1.19 12/25/2013    Prediabetes: This was diagnosed in 2008 with fasting glucose of 117 and a two-hour glucose of 177. She had been on metformin but not since she had problems with nausea and abdominal pain in 2014. Not able to exercise much because of her osteoarthritis in the knees She does not allow her weight to be taken.  She thinks he is usually watching her diet. Blood sugar fasting is normal at home usually and A1c is usually below 6%.  She may have higher blood sugars  if getting intra-articular steroids.   Recent glucose in the lab was normal  Lab Results  Component Value Date   HGBA1C 5.4 08/29/2014   HGBA1C 5.7 04/30/2014   HGBA1C 5.6 12/25/2013   Lab Results  Component Value Date   MICROALBUR 0.4 12/05/2012   LDLCALC 167* 03/25/2015   CREATININE 0.55 03/25/2015    Hypertension: She has had long-standing hypertension which has been well controlled usually. Has  No lightheadedness.   Currently on Lotensin HCT and also on potassium supplements  Blood pressure was checked second time standing and was normal  HYPERLIPIDEMIA: Previously had taken Crestor. She is afraid of possible side effects including liver effects and ? Abdominal discomfort.  She was given 20 mg pravastatin but she thinks this causes nausea and has taken this irregularly More recently has tried to take it at bedtime which prevents her from having nausea and only after seeing her lab results she started taking the medication daily LDL 167 now. Highest LDL has been  over 200.  Lab Results  Component Value Date   CHOL 246* 03/25/2015   HDL 58.60 03/25/2015   LDLCALC 167* 03/25/2015   LDLDIRECT 193.5 02/07/2013   TRIG 101.0 03/25/2015   CHOLHDL 4 03/25/2015          Medication List       This list is accurate as of: 04/04/15 12:57 PM.  Always use your most recent med list.               benazepril-hydrochlorthiazide 20-12.5 MG tablet  Commonly known as:  LOTENSIN HCT  TAKE 1 TABLET ONCE DAILY.     isosorbide mononitrate 30  MG 24 hr tablet  Commonly known as:  IMDUR  TAKE 1 TABLET ONCE DAILY.     pantoprazole 40 MG tablet  Commonly known as:  PROTONIX  TAKE 1 TABLET ONCE DAILY.     PERCOCET 10-325 MG tablet  Generic drug:  oxyCODONE-acetaminophen  Take 1 tablet by mouth as needed.     polyethylene glycol packet  Commonly known as:  MIRALAX / GLYCOLAX  Take 17 g by mouth 3 (three) times daily as needed.     potassium chloride 10 MEQ tablet  Commonly known as:  K-DUR  Take 2 tablets daily     potassium chloride SA 20 MEQ tablet  Commonly known as:  K-DUR,KLOR-CON  TAKE 1 TABLET EACH DAY.     pravastatin 20 MG tablet  Commonly known as:  PRAVACHOL  TAKE 1 TABLET ONCE  DAILY.     SYNTHROID 100 MCG tablet  Generic drug:  levothyroxine  TAKE 1 TABLET ONCE A DAY SIX DAYS A WEEK.     Vitamin D (Ergocalciferol) 50000 units Caps capsule  Commonly known as:  DRISDOL  TAKE ONE CAPSULE ONCE A WEEK.     VOLTAREN 1 % Gel  Generic drug:  diclofenac sodium  Apply topically 4 (four) times daily. To knees        Allergies:  Allergies  Allergen Reactions  . Gadolinium      Desc: pt states she had nausea after receiving MAGNEVIST for breast imaging   . Crestor [Rosuvastatin Calcium] Other (See Comments)    Abdominal discomfort  . Penicillins Rash    Past Medical History  Diagnosis Date  . Hypertension   . Dyslipidemia   . Diabetes mellitus   . Arthritis   . GERD (gastroesophageal reflux disease)   . Heart murmur   . Breast cancer ([place]) 1994    left sided cancer; unilateral mastectomy, no chemo or radiation  . BRCA2 positive 10/214    Past Surgical History  Procedure Laterality Date  . Cardiac catheterization      NORMAL CORONARY ARTERIES  . Mastectomy      LEFT BREAST  . Cholecystectomy    . Tonsillectomy      Family History  Problem Relation Age of Onset  . Heart attack Maternal Grandmother   . Pancreatic cancer Mother 38  . Bladder Cancer Father 49    heavy smoker and drinker  . Ovarian cancer Sister 40  . Hypertension Sister   . Lung cancer Brother 10  . Ovarian cancer Maternal Aunt 62  . Stomach cancer Maternal Grandfather   . Bone cancer Paternal Grandmother     unsure if this was a primary cancer  . Stomach cancer Paternal Grandfather   . Lung cancer Maternal Aunt     died in her 14s; former smoker  . Breast cancer Cousin     dx in her late 47s to 39s; paternal cousin    Social History:  reports that she has never smoked. She has never used smokeless tobacco. She reports that she does not drink alcohol or use illicit drugs.  Review of Systems   She has had marked osteoarthritis in her knee joints She gets Synvisc  injections by orthopedic surgeon     She continues to take vitamin D 50,000 units weekly, her last level was 79 in 3/15   She was having nocturia for 3-4 weeks and had asked for a urine exam and this was not showing any significant infection.  She thinks symptoms of  better spontaneously.  She drinks a lot of water  EXAM: BP 126/78 mmHg  Temp(Src) 97.8 F (36.6 C) (Oral)  No lymphadenopathy in the neck No pedal edema Biceps reflexes are normal  Assessment/Plan:   1. Hypertension: Well controlled on Zestoretic  2. Hypothyroidism: TSH  has been consistently in  normal range .  She will continue her Synthroid 6 days a week 3. She has had a significantly higher white blood cell count with increased neutrophils without any obvious infection.  Will repeat this and also do a peripheral smear 4. Hypercholesterolemia:  LDL 167.  She wants to try Pravastatin daily as she is more convinced about need to take this and will try to continue taking this at bedtime when she has less nausea 5. History of prediabetes: Her glucose is below 100.  To check A1c on the next visit 6.  History of hypokalemia on Zestoretic.  Continue potassium supplements 7. Nocturia: Likely to be related to excess water intake and diuresis at night.  Also may have overactive bladder but has no symptoms at this time 8. Vitamin D deficiency: Adequately replaced with normal levels now, consider follow-up bone density    Rhett Najera 04/04/2015, 12:57 PM

## 2015-04-04 NOTE — Progress Notes (Signed)
Pre visit review using our clinic review tool, if applicable. No additional management support is needed unless otherwise documented below in the visit note.  Pt declined weight//acm

## 2015-04-04 NOTE — Patient Instructions (Signed)
Take Pravastatin daily

## 2015-04-07 LAB — PATHOLOGIST SMEAR REVIEW
PATH REV PLTS: NORMAL
Path Rev RBC: NORMAL

## 2015-04-07 NOTE — Progress Notes (Signed)
Quick Note:  Please let patient know that the white blood count is better as also hemoglobin. Will recheck on next visit ______

## 2015-04-08 DIAGNOSIS — M170 Bilateral primary osteoarthritis of knee: Secondary | ICD-10-CM | POA: Diagnosis not present

## 2015-04-15 ENCOUNTER — Encounter: Payer: Self-pay | Admitting: Cardiology

## 2015-04-15 ENCOUNTER — Ambulatory Visit (INDEPENDENT_AMBULATORY_CARE_PROVIDER_SITE_OTHER): Payer: Medicare Other | Admitting: Cardiology

## 2015-04-15 VITALS — BP 156/82 | HR 104 | Ht 68.0 in

## 2015-04-15 DIAGNOSIS — R072 Precordial pain: Secondary | ICD-10-CM

## 2015-04-15 DIAGNOSIS — I10 Essential (primary) hypertension: Secondary | ICD-10-CM | POA: Diagnosis not present

## 2015-04-15 DIAGNOSIS — E785 Hyperlipidemia, unspecified: Secondary | ICD-10-CM

## 2015-04-15 NOTE — Progress Notes (Signed)
Patient ID: Angela [name], female   DOB: March 31, 1932, 82 y.o.   MRN: [id number]    Date:  04/15/2015   ID:  Angela [name] Ward Quebrada, DOB [birth year], MRN [id number]  PCP:  [name], MD  Primary Cardiologist:  [place]   Chief Complaint  Patient presents with  . Follow-up     no chest pain, no shortness of breath, not much edema, no  pain or cramping in legs, no lightheadedness or dizziness     History of Present Illness: Angela [name] Ward Angela [name] is a 82 y.o. female history of hypertension, dyslipidemia, diabetes mellitus, arthritis, GERD and breast cancer.  Seen for cardiology follow up.  Her last echocardiogram was 09/11/2012 and her ejection fraction was 60% with no regional wall motion abnormalities. She had trivial aortic valve regurgitation in the left atrium was mildly dilated.  She's reports cardiac catheterization in the past   which was normal. Seen last fall for preop evaluation. Myoview study was normal.   On follow up today she denies any chest pain, SOB, palpitations, or dizziness. Feels good overall.  Wt Readings from Last 3 Encounters:  01/07/15 97.523 kg (215 lb)  09/01/12 97.886 kg (215 lb 12.8 oz)  09/17/10 106.595 kg (235 lb)     Past Medical History  Diagnosis Date  . Hypertension   . Dyslipidemia   . Diabetes mellitus   . Arthritis   . GERD (gastroesophageal reflux disease)   . Heart murmur   . Breast cancer ([place]) 1994    left sided cancer; unilateral mastectomy, no chemo or radiation  . BRCA2 positive 10/214    Current Outpatient Prescriptions  Medication Sig Dispense Refill  . benazepril-hydrochlorthiazide (LOTENSIN HCT) 20-12.5 MG tablet TAKE 1 TABLET ONCE DAILY. 30 tablet 5  . isosorbide mononitrate (IMDUR) 30 MG 24 hr tablet TAKE 1 TABLET ONCE DAILY. 30 tablet 5  . pantoprazole (PROTONIX) 40 MG tablet TAKE 1 TABLET ONCE DAILY. 30 tablet 5  . PERCOCET 10-325 MG per tablet Take 1 tablet by mouth as needed.    . polyethylene glycol (MIRALAX / GLYCOLAX) packet  Take 17 g by mouth 3 (three) times daily as needed. 30 each 0  . potassium chloride (K-DUR) 10 MEQ tablet Take 2 tablets daily 60 tablet 3  . pravastatin (PRAVACHOL) 20 MG tablet TAKE 1 TABLET ONCE DAILY. 30 tablet 5  . SYNTHROID 100 MCG tablet TAKE 1 TABLET ONCE A DAY SIX DAYS A WEEK. 78 tablet 1  . Vitamin D, Ergocalciferol, (DRISDOL) 50000 UNITS CAPS capsule TAKE ONE CAPSULE ONCE A WEEK. 4 capsule 5  . VOLTAREN 1 % GEL Apply topically 4 (four) times daily. To knees     No current facility-administered medications for this visit.    Allergies:    Allergies  Allergen Reactions  . Gadolinium      Desc: pt states she had nausea after receiving MAGNEVIST for breast imaging   . Crestor [Rosuvastatin Calcium] Other (See Comments)    Abdominal discomfort  . Penicillins Rash    Social History:  The patient  reports that she has never smoked. She has never used smokeless tobacco. She reports that she does not drink alcohol or use illicit drugs.   Family history:   Family History  Problem Relation Age of Onset  . Heart attack Maternal Grandmother   . Pancreatic cancer Mother 40  . Bladder Cancer Father 19    heavy smoker and drinker  . Ovarian cancer Sister 31  . Hypertension Sister   .  Lung cancer Brother 35  . Ovarian cancer Maternal Aunt 62  . Stomach cancer Maternal Grandfather   . Bone cancer Paternal Grandmother     unsure if this was a primary cancer  . Stomach cancer Paternal Grandfather   . Lung cancer Maternal Aunt     died in her 45s; former smoker  . Breast cancer Cousin     dx in her late 47s to 12s; paternal cousin    ROS:  Please see the history of present illness.  All other systems reviewed and negative.   PHYSICAL EXAM: VS:  BP 156/82 mmHg  Pulse 104  Ht '5\' 8"'  (1.727 m) Obese well developed, in no acute distress HEENT: normal. Neck: no JVDNo cervical lymphadenopathy. Cardiac: Regular rate and rhythm 1/6 systolic murmur Lungs:  clear to auscultation  bilaterally, no wheezing, rhonchi or rales Abd: soft, nontender, positive bowel sounds all quadrants, no hepatosplenomegaly Ext: no lower extremity edema.  2+ radial and dorsalis pedis pulses. Skin: warm and dry Neuro:  Grossly normal  EKG:  Not done.  ASSESSMENT AND PLAN:  Problem List Items Addressed This Visit    Hypertension   Dyslipidemia - Primary   Chest pain      Chest pain Resolved. Normal Myoview in October. Cardiac evaluation in past also normal. Reassured. No further work up needed. Will follow up prn.   Essential hypertension  Well-controlled no changes medications.  Hyperlipidemia  Continue statin as much as she'll tolerate.

## 2015-04-15 NOTE — Patient Instructions (Addendum)
Continue your current therapy  I will see you as needed. 

## 2015-05-05 ENCOUNTER — Other Ambulatory Visit: Payer: Self-pay | Admitting: Endocrinology

## 2015-05-06 ENCOUNTER — Ambulatory Visit: Payer: Medicare Other | Admitting: Endocrinology

## 2015-05-07 ENCOUNTER — Encounter: Payer: Self-pay | Admitting: Endocrinology

## 2015-05-08 DIAGNOSIS — M170 Bilateral primary osteoarthritis of knee: Secondary | ICD-10-CM | POA: Diagnosis not present

## 2015-05-08 DIAGNOSIS — D485 Neoplasm of uncertain behavior of skin: Secondary | ICD-10-CM | POA: Diagnosis not present

## 2015-05-08 DIAGNOSIS — L821 Other seborrheic keratosis: Secondary | ICD-10-CM | POA: Diagnosis not present

## 2015-05-08 DIAGNOSIS — L820 Inflamed seborrheic keratosis: Secondary | ICD-10-CM | POA: Diagnosis not present

## 2015-05-08 DIAGNOSIS — L570 Actinic keratosis: Secondary | ICD-10-CM | POA: Diagnosis not present

## 2015-06-05 ENCOUNTER — Other Ambulatory Visit: Payer: Self-pay | Admitting: Endocrinology

## 2015-06-09 DIAGNOSIS — M170 Bilateral primary osteoarthritis of knee: Secondary | ICD-10-CM | POA: Diagnosis not present

## 2015-07-07 ENCOUNTER — Telehealth: Payer: Self-pay | Admitting: *Deleted

## 2015-07-07 ENCOUNTER — Other Ambulatory Visit: Payer: Self-pay | Admitting: *Deleted

## 2015-07-07 MED ORDER — AZITHROMYCIN 250 MG PO TABS: ORAL_TABLET | ORAL | Status: DC

## 2015-07-07 MED ORDER — HYDROCOD POLST-CPM POLST ER 10-8 MG/5ML PO SUER: ORAL | Status: DC

## 2015-07-07 NOTE — Telephone Encounter (Signed)
Patient called, she's been sick with a sinus infection since Thursday night, she can't stop coughing, has a fever of 101+ and is coughing up green phlegm. She wants to know if we can send her in an antibiotic and cough medicine please? She sounds awful.

## 2015-07-07 NOTE — Telephone Encounter (Signed)
Noted, rx sent for z-pak, tussionex on your desk for signature.

## 2015-07-07 NOTE — Telephone Encounter (Signed)
She can have a Z-Pack, also Tussionex 1 teaspoon twice a day as needed, 60 mL

## 2015-07-08 DIAGNOSIS — M170 Bilateral primary osteoarthritis of knee: Secondary | ICD-10-CM | POA: Diagnosis not present

## 2015-07-22 ENCOUNTER — Other Ambulatory Visit: Payer: Medicare Other

## 2015-07-29 ENCOUNTER — Ambulatory Visit: Payer: Medicare Other | Admitting: Endocrinology

## 2015-07-29 ENCOUNTER — Other Ambulatory Visit: Payer: Medicare Other

## 2015-07-29 ENCOUNTER — Other Ambulatory Visit (INDEPENDENT_AMBULATORY_CARE_PROVIDER_SITE_OTHER): Payer: Medicare Other

## 2015-07-29 DIAGNOSIS — D72829 Elevated white blood cell count, unspecified: Secondary | ICD-10-CM

## 2015-07-29 DIAGNOSIS — R7301 Impaired fasting glucose: Secondary | ICD-10-CM | POA: Diagnosis not present

## 2015-07-29 DIAGNOSIS — E039 Hypothyroidism, unspecified: Secondary | ICD-10-CM

## 2015-07-29 DIAGNOSIS — E785 Hyperlipidemia, unspecified: Secondary | ICD-10-CM | POA: Diagnosis not present

## 2015-07-29 LAB — CBC WITH DIFFERENTIAL/PLATELET
BASOS PCT: 0.3 % (ref 0.0–3.0)
Basophils Absolute: 0.1 10*3/uL (ref 0.0–0.1)
EOS ABS: 0.2 10*3/uL (ref 0.0–0.7)
EOS PCT: 1.2 % (ref 0.0–5.0)
HEMATOCRIT: 46.2 % — AB (ref 36.0–46.0)
Hemoglobin: 15.5 g/dL — ABNORMAL HIGH (ref 12.0–15.0)
LYMPHS ABS: 3.5 10*3/uL (ref 0.7–4.0)
Lymphocytes Relative: 24.2 % (ref 12.0–46.0)
MCHC: 33.5 g/dL (ref 30.0–36.0)
MCV: 89.6 fl (ref 78.0–100.0)
MONO ABS: 1.1 10*3/uL — AB (ref 0.1–1.0)
Monocytes Relative: 7.8 % (ref 3.0–12.0)
Neutro Abs: 9.7 10*3/uL — ABNORMAL HIGH (ref 1.4–7.7)
Neutrophils Relative %: 66.5 % (ref 43.0–77.0)
PLATELETS: 239 10*3/uL (ref 150.0–400.0)
RBC: 5.15 Mil/uL — ABNORMAL HIGH (ref 3.87–5.11)
RDW: 13.8 % (ref 11.5–15.5)
WBC: 14.6 10*3/uL — ABNORMAL HIGH (ref 4.0–10.5)

## 2015-07-29 LAB — COMPREHENSIVE METABOLIC PANEL
ALBUMIN: 3.9 g/dL (ref 3.5–5.2)
ALK PHOS: 92 U/L (ref 39–117)
ALT: 14 U/L (ref 0–35)
AST: 13 U/L (ref 0–37)
BUN: 10 mg/dL (ref 6–23)
CHLORIDE: 98 meq/L (ref 96–112)
CO2: 32 mEq/L (ref 19–32)
Calcium: 9.2 mg/dL (ref 8.4–10.5)
Creatinine, Ser: 0.57 mg/dL (ref 0.40–1.20)
GFR: 108.16 mL/min (ref >60.00)
Glucose, Bld: 117 mg/dL — ABNORMAL HIGH (ref 70–99)
POTASSIUM: 3.7 meq/L (ref 3.5–5.1)
SODIUM: 141 meq/L (ref 135–145)
Total Bilirubin: 1.1 mg/dL (ref 0.2–1.2)
Total Protein: 6.6 g/dL (ref 6.0–8.3)

## 2015-07-29 LAB — HEMOGLOBIN A1C: HEMOGLOBIN A1C: 5.9 % (ref 4.6–6.5)

## 2015-07-29 LAB — LIPID PANEL
CHOLESTEROL: 280 mg/dL — AB (ref 0–200)
HDL: 49.2 mg/dL (ref >39.00)
LDL CALC: 196 mg/dL — AB (ref 0–99)
NonHDL: 230.53
Total CHOL/HDL Ratio: 6
Triglycerides: 173 mg/dL — ABNORMAL HIGH (ref 0.0–149.0)
VLDL: 34.6 mg/dL (ref 0.0–40.0)

## 2015-07-29 LAB — T4, FREE: Free T4: 1.1 ng/dL (ref 0.60–1.60)

## 2015-07-29 LAB — TSH: TSH: 7.22 u[IU]/mL — AB (ref 0.35–4.50)

## 2015-07-30 ENCOUNTER — Other Ambulatory Visit: Payer: Self-pay | Admitting: Endocrinology

## 2015-08-05 ENCOUNTER — Ambulatory Visit (INDEPENDENT_AMBULATORY_CARE_PROVIDER_SITE_OTHER): Payer: Medicare Other | Admitting: Endocrinology

## 2015-08-05 ENCOUNTER — Encounter: Payer: Self-pay | Admitting: Endocrinology

## 2015-08-05 VITALS — BP 136/62 | HR 93 | Temp 98.1°F | Resp 14 | Ht 68.0 in

## 2015-08-05 DIAGNOSIS — M170 Bilateral primary osteoarthritis of knee: Secondary | ICD-10-CM | POA: Diagnosis not present

## 2015-08-05 DIAGNOSIS — M8580 Other specified disorders of bone density and structure, unspecified site: Secondary | ICD-10-CM | POA: Diagnosis not present

## 2015-08-05 DIAGNOSIS — E785 Hyperlipidemia, unspecified: Secondary | ICD-10-CM

## 2015-08-05 DIAGNOSIS — M810 Age-related osteoporosis without current pathological fracture: Secondary | ICD-10-CM

## 2015-08-05 DIAGNOSIS — I10 Essential (primary) hypertension: Secondary | ICD-10-CM | POA: Diagnosis not present

## 2015-08-05 DIAGNOSIS — E039 Hypothyroidism, unspecified: Secondary | ICD-10-CM

## 2015-08-05 NOTE — Patient Instructions (Addendum)
Take Synthroid daily  Take 40mg  Pravastatin with dinner  Watch saturated fats in diet

## 2015-08-05 NOTE — Progress Notes (Signed)
Patient ID: Angela [name], female   DOB: 04/19/1932, 82 y.o.   MRN: [id number]    Chief complaint: Followup for various problems  History of Present Illness:   LEUKOCYTOSIS:  She has had mild chronic leukocytosis. Her leukocytosis was felt to be unrelated to pathological processes by hematologist Highest level was over 17,000 without any active infections or symptoms of fever or chills Also has a relatively higher hematocrit, stable  She still feels fairly good overall except for mild chronic tiredness.  No weight loss or night sweats  Lab Results  Component Value Date   WBC 14.6* 07/29/2015   HGB 15.5* 07/29/2015   HCT 46.2* 07/29/2015   MCV 89.6 07/29/2015   PLT 239.0 07/29/2015     Primary hypothyroidism: She has had long-standing primary hypothyroidism.  She has been on consistent  dosage. Using 100 g, 6 days/week Her energy level is not very good and she thinks she is more tired now Also sometimes will take a nap in the afternoon She is recently not always compliant with taking her medication in the morning, also will take it at different times with asking about whether she can take it with coffee  TSH higher than usual level of below 2.0  Lab Results  Component Value Date   TSH 7.22* 07/29/2015   TSH 1.78 03/26/2015   TSH 1.19 08/29/2014   FREET4 1.10 07/29/2015   FREET4 1.11 08/29/2014   FREET4 1.23 04/30/2014    Prediabetes: This was diagnosed in 2008 with fasting glucose of 117 and a two-hour glucose of 177. She had been on metformin but not since she had problems with nausea and abdominal pain in 2014. Not able to exercise much because of her osteoarthritis in the knees She does not allow her weight to be taken.   She thinks She is usually watching her diet.  Blood sugar fasting is normal at home <100 usually and A1c is usually below 6%, Has not checked in about a month.  She may have higher blood sugars  if getting intra-articular steroids.    Recent glucose in the lab was 117But she may have had something to eat the night before during the night  Lab Results  Component Value Date   HGBA1C 5.9 07/29/2015   HGBA1C 5.4 08/29/2014   HGBA1C 5.7 04/30/2014   Lab Results  Component Value Date   MICROALBUR 0.4 12/05/2012   LDLCALC 196* 07/29/2015   CREATININE 0.57 07/29/2015    Hypertension: She has had long-standing hypertension which has been well controlled usually.  No lightheadedness.   Currently on Lotensin HCT and also on potassium supplements    HYPERLIPIDEMIA: Previously had taken Crestor. She is afraid of possible side effects including liver effects and ? Abdominal discomfort.  She was given 20 mg pravastatin but she thinks this causes nausea and has stopped taking this  LDL 196 now. Highest LDL has been  over 200.  Lab Results  Component Value Date   CHOL 280* 07/29/2015   HDL 49.20 07/29/2015   LDLCALC 196* 07/29/2015   LDLDIRECT 193.5 02/07/2013   TRIG 173.0* 07/29/2015   CHOLHDL 6 07/29/2015          Medication List       This list is accurate as of: 08/05/15 11:14 AM.  Always use your most recent med list.               benazepril-hydrochlorthiazide 20-12.5 MG tablet  Commonly known as:  LOTENSIN  HCT  TAKE 1 TABLET ONCE DAILY.     chlorpheniramine-HYDROcodone 10-8 MG/5ML Suer  Commonly known as:  [facility]  Take 1 teaspooon 2 times daily as needed for cough     isosorbide mononitrate 30 MG 24 hr tablet  Commonly known as:  IMDUR  TAKE 1 TABLET ONCE DAILY.     pantoprazole 40 MG tablet  Commonly known as:  PROTONIX  TAKE 1 TABLET ONCE DAILY.     PERCOCET 10-325 MG tablet  Generic drug:  oxyCODONE-acetaminophen  Take 1 tablet by mouth as needed.     polyethylene glycol packet  Commonly known as:  MIRALAX / GLYCOLAX  Take 17 g by mouth 3 (three) times daily as needed.     potassium chloride 10 MEQ tablet  Commonly known as:  K-DUR  Take 2 tablets daily      pravastatin 20 MG tablet  Commonly known as:  PRAVACHOL  TAKE 1 TABLET ONCE DAILY.     SYNTHROID 100 MCG tablet  Generic drug:  levothyroxine  TAKE 1 TABLET ONCE A DAY SIX DAYS A WEEK.     Vitamin D (Ergocalciferol) 50000 units Caps capsule  Commonly known as:  DRISDOL  TAKE ONE CAPSULE ONCE A WEEK.     VOLTAREN 1 % Gel  Generic drug:  diclofenac sodium  Apply topically 4 (four) times daily. To knees        Allergies:  Allergies  Allergen Reactions  . Gadolinium      Desc: pt states she had nausea after receiving MAGNEVIST for breast imaging   . Crestor [Rosuvastatin Calcium] Other (See Comments)    Abdominal discomfort  . Penicillins Rash    Past Medical History  Diagnosis Date  . Hypertension   . Dyslipidemia   . Diabetes mellitus   . Arthritis   . GERD (gastroesophageal reflux disease)   . Heart murmur   . Breast cancer ([place]) 1994    left sided cancer; unilateral mastectomy, no chemo or radiation  . BRCA2 positive 10/214    Past Surgical History  Procedure Laterality Date  . Cardiac catheterization      NORMAL CORONARY ARTERIES  . Mastectomy      LEFT BREAST  . Cholecystectomy    . Tonsillectomy      Family History  Problem Relation Age of Onset  . Heart attack Maternal Grandmother   . Pancreatic cancer Mother 38  . Bladder Cancer Father 75    heavy smoker and drinker  . Ovarian cancer Sister 70  . Hypertension Sister   . Lung cancer Brother 26  . Ovarian cancer Maternal Aunt 62  . Stomach cancer Maternal Grandfather   . Bone cancer Paternal Grandmother     unsure if this was a primary cancer  . Stomach cancer Paternal Grandfather   . Lung cancer Maternal Aunt     died in her 23s; former smoker  . Breast cancer Cousin     dx in her late 40s to 84s; paternal cousin    Social History:  reports that she has never smoked. She has never used smokeless tobacco. She reports that she does not drink alcohol or use illicit drugs.  Review of Systems     She has had marked osteoarthritis in her knee joints She gets Synvisc injections by orthopedic surgeonBut also periodic steroid injections    She continues to take vitamin D 50,000 units weekly, her last level was   Lab Results  Component Value Date  VD25OH 39.09 03/25/2015      EXAM: BP 136/62 mmHg  Pulse 93  Temp(Src) 98.1 F (36.7 C)  Resp 14  Ht '5\' 8"'  (1.727 m)  Wt   SpO2 93%  No ankle edema present  Assessment/Plan:   1. Hypertension: Well controlled on Zestoretic  2. Hypothyroidism: TSH is now relatively high but not clear if she is consistently compliant with her medication.  She is complaining of more fatigue and will increase her 100 g dose to once a day.  She will follow-up in 6 weeks  3. She has had a persistently high  white blood cell count with increased neutrophils without any obvious infection.  This may be related to her getting periodic steroid injections in her knees 4. Hypercholesterolemia:  LDL 196.  She is again very noncompliant with her medication and not clear why.  Most likely will need at least 40 mg of pravastatin and possibly a more potent statin.  Discussed trying to work on her saturated fat intake also 5. History of prediabetes: Her A1c is not high but her glucose was 117; she does not know if this was truly fasting.  She agrees to come back for lab work in 6 weeks 6.  History of hypokalemia on Zestoretic.  Continue potassium supplements 7. History of OSTEOPENIA: Since she has had repeated intra-articular steroids will need to have a bone density checked again and this will be scheduled 8. Vitamin D deficiency: Adequately supplemented    Patient Instructions  Take Synthroid daily  Take 74m Pravastatin with dinner  Watch saturated fats in diet        Soul Hackman 08/05/2015, 11:14 AM

## 2015-08-19 ENCOUNTER — Telehealth: Payer: Self-pay | Admitting: *Deleted

## 2015-08-19 ENCOUNTER — Other Ambulatory Visit: Payer: Self-pay | Admitting: *Deleted

## 2015-08-19 MED ORDER — CIPROFLOXACIN HCL 500 MG PO TABS: 500.0000 mg | ORAL_TABLET | Freq: Two times a day (BID) | ORAL | Status: DC

## 2015-08-19 NOTE — Telephone Encounter (Signed)
Cipro 500 mg twice a day, 10 tablets 

## 2015-08-19 NOTE — Telephone Encounter (Signed)
Noted, rx sent patient is aware 

## 2015-08-19 NOTE — Telephone Encounter (Signed)
Patient called, she said she has a UTI again, her symptoms are burning, itching, frequency, she said the burning is worse when she finishes.  She wants to know if you can call her something in for that? She has no way to come to the office for an appointment. Please advise.

## 2015-08-25 ENCOUNTER — Other Ambulatory Visit: Payer: Self-pay | Admitting: Endocrinology

## 2015-08-28 ENCOUNTER — Telehealth: Payer: Self-pay | Admitting: Endocrinology

## 2015-08-28 NOTE — Telephone Encounter (Signed)
I contacted the pt and left a vm advising of note below. Requested a call back from the pt to schedule lab appointment and to verify the symptoms she is having.

## 2015-08-28 NOTE — Telephone Encounter (Signed)
Patient need refill of medication of Cipro called into her pharmacy. she has another UTI  [place], [place] - [address] [phone] (Phone) [phone] (Fax)

## 2015-08-28 NOTE — Telephone Encounter (Signed)
Urine is yellowish and not using restroom as often, a little pain not a lot though

## 2015-08-28 NOTE — Telephone Encounter (Signed)
She will need to come in for urinalysis and culture because she just had a course of Cipro.  Need to know exact symptoms also

## 2015-08-28 NOTE — Telephone Encounter (Signed)
See note below and please advise if ok to refill. Thanks!  

## 2015-08-28 NOTE — Telephone Encounter (Signed)
See note below to be advised of pt's symptoms.

## 2015-08-28 NOTE — Telephone Encounter (Signed)
Will need urinalysis and culture before getting antibiotics

## 2015-08-29 NOTE — Telephone Encounter (Signed)
Pt scheduled lab appointment for 09/01/2015.

## 2015-08-30 ENCOUNTER — Inpatient Hospital Stay (HOSPITAL_COMMUNITY): Admit: 2015-08-30 | Discharge: 2015-08-30 | Disposition: A | Discharge status: Home / Self Care | Payer: Medicare Other

## 2015-08-30 ENCOUNTER — Emergency Department (HOSPITAL_COMMUNITY): Payer: Medicare Other

## 2015-08-30 ENCOUNTER — Encounter (HOSPITAL_COMMUNITY): Payer: Self-pay | Admitting: Emergency Medicine

## 2015-08-30 ENCOUNTER — Inpatient Hospital Stay (HOSPITAL_COMMUNITY): Admit: 2015-08-30 | Payer: Medicare Other

## 2015-08-30 ENCOUNTER — Inpatient Hospital Stay (HOSPITAL_COMMUNITY)
Admission: EM | Admit: 2015-08-30 | Discharge: 2015-09-02 | DRG: 175 | Disposition: A | Discharge status: Home / Self Care | Payer: Medicare Other | Attending: Internal Medicine | Admitting: Internal Medicine

## 2015-08-30 DIAGNOSIS — Z8249 Family history of ischemic heart disease and other diseases of the circulatory system: Secondary | ICD-10-CM | POA: Diagnosis not present

## 2015-08-30 DIAGNOSIS — I110 Hypertensive heart disease with heart failure: Secondary | ICD-10-CM | POA: Diagnosis present

## 2015-08-30 DIAGNOSIS — E119 Type 2 diabetes mellitus without complications: Secondary | ICD-10-CM | POA: Diagnosis present

## 2015-08-30 DIAGNOSIS — I5033 Acute on chronic diastolic (congestive) heart failure: Secondary | ICD-10-CM | POA: Diagnosis present

## 2015-08-30 DIAGNOSIS — Z880 Allergy status to penicillin: Secondary | ICD-10-CM | POA: Diagnosis not present

## 2015-08-30 DIAGNOSIS — I10 Essential (primary) hypertension: Secondary | ICD-10-CM

## 2015-08-30 DIAGNOSIS — Z801 Family history of malignant neoplasm of trachea, bronchus and lung: Secondary | ICD-10-CM

## 2015-08-30 DIAGNOSIS — I2699 Other pulmonary embolism without acute cor pulmonale: Secondary | ICD-10-CM | POA: Diagnosis not present

## 2015-08-30 DIAGNOSIS — Z91041 Radiographic dye allergy status: Secondary | ICD-10-CM

## 2015-08-30 DIAGNOSIS — I2510 Atherosclerotic heart disease of native coronary artery without angina pectoris: Secondary | ICD-10-CM | POA: Diagnosis present

## 2015-08-30 DIAGNOSIS — Z803 Family history of malignant neoplasm of breast: Secondary | ICD-10-CM | POA: Diagnosis not present

## 2015-08-30 DIAGNOSIS — I444 Left anterior fascicular block: Secondary | ICD-10-CM | POA: Diagnosis present

## 2015-08-30 DIAGNOSIS — E039 Hypothyroidism, unspecified: Secondary | ICD-10-CM | POA: Diagnosis not present

## 2015-08-30 DIAGNOSIS — Z853 Personal history of malignant neoplasm of breast: Secondary | ICD-10-CM | POA: Diagnosis not present

## 2015-08-30 DIAGNOSIS — Z8052 Family history of malignant neoplasm of bladder: Secondary | ICD-10-CM

## 2015-08-30 DIAGNOSIS — R0602 Shortness of breath: Secondary | ICD-10-CM | POA: Diagnosis not present

## 2015-08-30 DIAGNOSIS — Z808 Family history of malignant neoplasm of other organs or systems: Secondary | ICD-10-CM

## 2015-08-30 DIAGNOSIS — Z888 Allergy status to other drugs, medicaments and biological substances status: Secondary | ICD-10-CM | POA: Diagnosis not present

## 2015-08-30 DIAGNOSIS — Z8041 Family history of malignant neoplasm of ovary: Secondary | ICD-10-CM

## 2015-08-30 DIAGNOSIS — Z79899 Other long term (current) drug therapy: Secondary | ICD-10-CM

## 2015-08-30 DIAGNOSIS — K219 Gastro-esophageal reflux disease without esophagitis: Secondary | ICD-10-CM | POA: Diagnosis present

## 2015-08-30 DIAGNOSIS — I712 Thoracic aortic aneurysm, without rupture: Secondary | ICD-10-CM | POA: Diagnosis present

## 2015-08-30 DIAGNOSIS — E785 Hyperlipidemia, unspecified: Secondary | ICD-10-CM | POA: Diagnosis present

## 2015-08-30 DIAGNOSIS — M170 Bilateral primary osteoarthritis of knee: Secondary | ICD-10-CM | POA: Diagnosis not present

## 2015-08-30 DIAGNOSIS — I2609 Other pulmonary embolism with acute cor pulmonale: Secondary | ICD-10-CM | POA: Diagnosis not present

## 2015-08-30 DIAGNOSIS — Z800 Family history of malignant neoplasm of digestive organs: Secondary | ICD-10-CM | POA: Diagnosis not present

## 2015-08-30 DIAGNOSIS — I7121 Aneurysm of the ascending aorta, without rupture: Secondary | ICD-10-CM

## 2015-08-30 DIAGNOSIS — K210 Gastro-esophageal reflux disease with esophagitis: Secondary | ICD-10-CM | POA: Diagnosis not present

## 2015-08-30 LAB — CBC
HEMATOCRIT: 40.2 % (ref 36.0–46.0)
HEMOGLOBIN: 14.3 g/dL (ref 12.0–15.0)
MCH: 30.8 pg (ref 26.0–34.0)
MCHC: 35.6 g/dL (ref 30.0–36.0)
MCV: 86.6 fL (ref 78.0–100.0)
Platelets: 184 10*3/uL (ref 150–400)
RBC: 4.64 MIL/uL (ref 3.87–5.11)
RDW: 13.3 % (ref 11.5–15.5)
WBC: 14.7 10*3/uL — ABNORMAL HIGH (ref 4.0–10.5)

## 2015-08-30 LAB — TROPONIN I
Troponin I: <0.03 ng/mL
Troponin I: <0.03 ng/mL

## 2015-08-30 LAB — BASIC METABOLIC PANEL
ANION GAP: 10 (ref 5–15)
BUN: 10 mg/dL (ref 6–20)
CHLORIDE: 99 mmol/L — AB (ref 101–111)
CO2: 29 mmol/L (ref 22–32)
Calcium: 8.7 mg/dL — ABNORMAL LOW (ref 8.9–10.3)
Creatinine, Ser: 0.6 mg/dL (ref 0.44–1.00)
GFR calc non Af Amer: >60 mL/min (ref >60)
Glucose, Bld: 149 mg/dL — ABNORMAL HIGH (ref 65–99)
POTASSIUM: 3.1 mmol/L — AB (ref 3.5–5.1)
Sodium: 138 mmol/L (ref 135–145)

## 2015-08-30 LAB — MRSA PCR SCREENING: MRSA by PCR: NEGATIVE

## 2015-08-30 LAB — BRAIN NATRIURETIC PEPTIDE: B Natriuretic Peptide: 106.3 pg/mL — ABNORMAL HIGH (ref 0.0–100.0)

## 2015-08-30 LAB — HEPARIN LEVEL (UNFRACTIONATED)
HEPARIN UNFRACTIONATED: 0.6 [IU]/mL (ref 0.30–0.70)
Heparin Unfractionated: 0.32 IU/mL (ref 0.30–0.70)

## 2015-08-30 LAB — I-STAT TROPONIN, ED: TROPONIN I, POC: 0.01 ng/mL (ref 0.00–0.08)

## 2015-08-30 MED ORDER — POTASSIUM CHLORIDE CRYS ER 20 MEQ PO TBCR
40.0000 meq | EXTENDED_RELEASE_TABLET | Freq: Once | ORAL | Status: AC
  Administered 2015-08-30: 40 meq via ORAL
  Filled 2015-08-30: qty 2

## 2015-08-30 MED ORDER — ISOSORBIDE MONONITRATE ER 30 MG PO TB24
30.0000 mg | ORAL_TABLET | Freq: Every day | ORAL | Status: DC
  Administered 2015-08-31 – 2015-09-02 (×3): 30 mg via ORAL
  Filled 2015-08-30 (×3): qty 1

## 2015-08-30 MED ORDER — HYDROCHLOROTHIAZIDE 12.5 MG PO CAPS
12.5000 mg | ORAL_CAPSULE | Freq: Every day | ORAL | Status: DC
  Administered 2015-09-01 – 2015-09-02 (×2): 12.5 mg via ORAL
  Filled 2015-08-30 (×3): qty 1

## 2015-08-30 MED ORDER — PANTOPRAZOLE SODIUM 40 MG PO TBEC
40.0000 mg | DELAYED_RELEASE_TABLET | Freq: Every day | ORAL | Status: DC
  Administered 2015-08-31 – 2015-09-02 (×3): 40 mg via ORAL
  Filled 2015-08-30 (×3): qty 1

## 2015-08-30 MED ORDER — BENAZEPRIL-HYDROCHLOROTHIAZIDE 20-12.5 MG PO TABS: 1.0000 | ORAL_TABLET | Freq: Every day | ORAL | Status: DC

## 2015-08-30 MED ORDER — SODIUM CHLORIDE 0.9 % IV SOLN
INTRAVENOUS | Status: AC
  Administered 2015-08-30: 16:00:00 via INTRAVENOUS

## 2015-08-30 MED ORDER — LEVOTHYROXINE SODIUM 100 MCG PO TABS
100.0000 ug | ORAL_TABLET | Freq: Every day | ORAL | Status: DC
  Administered 2015-08-31 – 2015-09-02 (×3): 100 ug via ORAL
  Filled 2015-08-30 (×3): qty 1

## 2015-08-30 MED ORDER — HEPARIN BOLUS VIA INFUSION
2500.0000 [IU] | Freq: Once | INTRAVENOUS | Status: AC
  Administered 2015-08-30: 2500 [IU] via INTRAVENOUS
  Filled 2015-08-30: qty 2500

## 2015-08-30 MED ORDER — HEPARIN (PORCINE) IN NACL 100-0.45 UNIT/ML-% IJ SOLN
1400.0000 [IU]/h | INTRAMUSCULAR | Status: DC
  Administered 2015-08-30: 1300 [IU]/h via INTRAVENOUS
  Administered 2015-08-31: 1400 [IU]/h via INTRAVENOUS
  Filled 2015-08-30 (×3): qty 250

## 2015-08-30 MED ORDER — IOPAMIDOL (ISOVUE-370) INJECTION 76%
100.0000 mL | Freq: Once | INTRAVENOUS | Status: AC | PRN
  Administered 2015-08-30: 100 mL via INTRAVENOUS

## 2015-08-30 MED ORDER — OXYCODONE-ACETAMINOPHEN 5-325 MG PO TABS
1.0000 | ORAL_TABLET | Freq: Four times a day (QID) | ORAL | Status: DC | PRN
  Administered 2015-08-30 – 2015-09-01 (×2): 1 via ORAL
  Filled 2015-08-30 (×3): qty 1

## 2015-08-30 MED ORDER — BENAZEPRIL HCL 10 MG PO TABS
20.0000 mg | ORAL_TABLET | Freq: Every day | ORAL | Status: DC
  Administered 2015-09-01 – 2015-09-02 (×2): 20 mg via ORAL
  Filled 2015-08-30: qty 1
  Filled 2015-08-30 (×2): qty 2

## 2015-08-30 NOTE — ED Notes (Signed)
Increased work of breathing over the last couple days. Bilateral lower leg edema present 2+, no previous hx of heart failure. States she has been having a dry cough, with mild central chest pain. Believes her right leg appears more swollen than the left

## 2015-08-30 NOTE — ED Notes (Signed)
[place] at bedside, delay in transport

## 2015-08-30 NOTE — H&P (Signed)
History and Physical  Sharada Albornoz QVZ:[id number] DOB: 24-May-1932 DOA: 08/30/2015  Referring physician: ER Physician PCP: [name], MD  Outpatient Specialists:    Patient coming from: Home  Chief Complaint: SOB and leg swelling  HPI: 82 year old Caucasian with history of Hypertension, DM, Hyperlipidemia, left breast cancer, GERD and arthritis. Patient presents with history of bilateral leg swelling, right worse than left and SOB. Patient developed SOB [place]. CTA of the chest done revealed bilateral acute PE with CT evidence of right heart strain. BP has remained stable. Doppler ultrasound of the lower extremities is still pending. On further questioning, patient tells me that she had DVT about 50 years ago but could not remember the details.  History of breast cancer is noted as documented above. No headache, no neck pain, no chest pain, no palpitation, no GI symptoms and no urinary symptoms.  ED Course: Patient was started on Heparin drip  Pertinent labs: CTA chest reveals bilateral PE with CT evidence of right heart strain and 4cm dilatation of the ascending aorta.  Review of Systems: As in HPI. 12 systems were reviewed. Negative for fever, visual changes, sore throat, rash, new muscle aches, chest pain, dysuria, bleeding, n/v/abdominal pain.  Past Medical History  Diagnosis Date  . Hypertension   . Dyslipidemia   . Diabetes mellitus   . Arthritis   . GERD (gastroesophageal reflux disease)   . Heart murmur   . Breast cancer ([place]) 1994    left sided cancer; unilateral mastectomy, no chemo or radiation  . BRCA2 positive 10/214    Past Surgical History  Procedure Laterality Date  . Cardiac catheterization      NORMAL CORONARY ARTERIES  . Mastectomy      LEFT BREAST  . Cholecystectomy    . Tonsillectomy       reports that she has never smoked. She has never used smokeless tobacco. She reports that she does not drink alcohol or  use illicit drugs.  Allergies  Allergen Reactions  . Gadolinium      Desc: pt states she had nausea after receiving MAGNEVIST for breast imaging   . Pravastatin Other (See Comments)    Locked jaw, weakness  . Simvastatin Other (See Comments)    Weakness and locked jaw.  Marland Kitchen Crestor [Rosuvastatin Calcium] Other (See Comments)    Abdominal discomfort  . Penicillins Swelling and Rash    Has patient had a PCN reaction causing immediate rash, facial/tongue/throat swelling, SOB or lightheadedness with hypotension: yes Has patient had a PCN reaction causing severe rash involving mucus membranes or skin necrosis: unknown Has patient had a PCN reaction that required hospitalization: unknown Has patient had a PCN reaction occurring within the last 10 years: no If all of the above answers are "NO", then may proceed with Cephalosporin use.     Family History  Problem Relation Age of Onset  . Heart attack Maternal Grandmother   . Pancreatic cancer Mother 67  . Bladder Cancer Father 95    heavy smoker and drinker  . Ovarian cancer Sister 36  . Hypertension Sister   . Lung cancer Brother 63  . Ovarian cancer Maternal Aunt 62  . Stomach cancer Maternal Grandfather   . Bone cancer Paternal Grandmother     unsure if this was a primary cancer  . Stomach cancer Paternal Grandfather   . Lung cancer Maternal Aunt     died in her 108s; former smoker  .  Breast cancer Cousin     dx in her late 59s to 24s; paternal cousin     Prior to Admission medications   Medication Sig Start Date End Date Taking? Authorizing Provider  benazepril-hydrochlorthiazide (LOTENSIN HCT) 20-12.5 MG tablet TAKE 1 TABLET ONCE DAILY. 07/30/15  Yes [name], MD  ciprofloxacin (CIPRO) 500 MG tablet Take 1 tablet (500 mg total) by mouth 2 (two) times daily. 08/19/15  Yes [name], MD  isosorbide mononitrate (IMDUR) 30 MG 24 hr tablet TAKE 1 TABLET ONCE DAILY. 04/02/15  Yes [name], MD  pantoprazole (PROTONIX) 40 MG  tablet TAKE 1 TABLET ONCE DAILY. 08/25/15  Yes [name], MD  PERCOCET 10-325 MG per tablet Take 0.25-1 tablets by mouth every 6 (six) hours as needed for pain.  02/19/14  Yes Historical Provider, MD  polyethylene glycol (MIRALAX / GLYCOLAX) packet Take 17 g by mouth 3 (three) times daily as needed. Patient taking differently: Take 17 g by mouth 3 (three) times daily as needed for moderate constipation.  05/26/12  Yes Wandra Arthurs, MD  potassium chloride (K-DUR) 10 MEQ tablet Take 2 tablets daily Patient taking differently: Take 20 mEq by mouth daily.  01/30/15  Yes [name], MD  SYNTHROID 100 MCG tablet TAKE 1 TABLET ONCE A DAY SIX DAYS A WEEK. Patient taking differently: TAKE 1 TABLET ONCE a DAY 04/02/15  Yes [name], MD  Vitamin D, Ergocalciferol, (DRISDOL) 50000 units CAPS capsule TAKE ONE CAPSULE ONCE A WEEK. 06/05/15  Yes [name], MD  VOLTAREN 1 % GEL Apply 2 g topically 4 (four) times daily as needed (pain). To knees 02/06/14  Yes Historical Provider, MD    Physical Exam: Filed Vitals:   08/30/15 1107 08/30/15 1130 08/30/15 1200 08/30/15 1309  BP: 147/76 143/72 143/71   Pulse: 100 93 86   Temp: 98 F (36.7 C)     TempSrc: Oral     Resp: '15 22 17   ' Weight:    101.56 kg (223 lb 14.4 oz)  SpO2: 96% 94% 95%     Constitutional:  . Appears calm and comfortable Eyes:  . PERRL and irises appear normal ENMT:  . external ears, nose appear normal Neck:  Neck is supple. No JVD.  Respiratory:  . CTA bilaterally, no w/r/r.  . Respiratory effort normal. No retractions or accessory muscle use Cardiovascular:  . RRR, no m/r/g . Swelling of the legs (Right greater than the left).   Abdomen:  . Abdomen appears normal; no tenderness or masses Neurologic:  Awake and alert. Moves all limbs.   Wt Readings from Last 3 Encounters:  08/30/15 101.56 kg (223 lb 14.4 oz)  01/07/15 97.523 kg (215 lb)  09/01/12 97.886 kg (215 lb 12.8 oz)    I have personally reviewed following labs and  imaging studies  Labs on Admission:  CBC:  Recent Labs Lab 08/30/15 1130  WBC 14.7*  HGB 14.3  HCT 40.2  MCV 86.6  PLT 419   Basic Metabolic Panel:  Recent Labs Lab 08/30/15 1130  NA 138  K 3.1*  CL 99*  CO2 29  GLUCOSE 149*  BUN 10  CREATININE 0.60  CALCIUM 8.7*   Liver Function Tests: No results for input(s): AST, ALT, ALKPHOS, BILITOT, PROT, ALBUMIN in the last 168 hours. No results for input(s): LIPASE, AMYLASE in the last 168 hours. No results for input(s): AMMONIA in the last 168 hours. Coagulation Profile: No results for input(s): INR, PROTIME in the last 168 hours.  Cardiac Enzymes: No results for input(s): CKTOTAL, CKMB, CKMBINDEX, TROPONINI in the last 168 hours. BNP (last 3 results) No results for input(s): PROBNP in the last 8760 hours. HbA1C: No results for input(s): HGBA1C in the last 72 hours. CBG: No results for input(s): GLUCAP in the last 168 hours. Lipid Profile: No results for input(s): CHOL, HDL, LDLCALC, TRIG, CHOLHDL, LDLDIRECT in the last 72 hours. Thyroid Function Tests: No results for input(s): TSH, T4TOTAL, FREET4, T3FREE, THYROIDAB in the last 72 hours. Anemia Panel: No results for input(s): VITAMINB12, FOLATE, FERRITIN, TIBC, IRON, RETICCTPCT in the last 72 hours. Urine analysis:    Component Value Date/Time   COLORURINE YELLOW 03/25/2015 1124   APPEARANCEUR CLEAR 03/25/2015 1124   LABSPEC 1.025 03/25/2015 1124   PHURINE 6.0 03/25/2015 1124   GLUCOSEU NEGATIVE 03/25/2015 1124   GLUCOSEU NEGATIVE 10/11/2007 0900   HGBUR NEGATIVE 03/25/2015 1124   BILIRUBINUR NEGATIVE 03/25/2015 1124   KETONESUR NEGATIVE 03/25/2015 1124   PROTEINUR NEGATIVE 10/11/2007 0900   UROBILINOGEN 0.2 03/25/2015 1124   NITRITE NEGATIVE 03/25/2015 1124   LEUKOCYTESUR TRACE* 03/25/2015 1124   Sepsis Labs: '@LABRCNTIP' (procalcitonin:4,lacticidven:4) )No results found for this or any previous visit (from the past 240 hour(s)).    Radiological Exams on  Admission: Dg Chest 2 View  08/30/2015  CLINICAL DATA:  Chest pain, shortness of breath, leg swelling EXAM: CHEST  2 VIEW COMPARISON:  05/26/2012 FINDINGS: Cardiomediastinal silhouette is stable. Mild elevation of the right hemidiaphragm again noted. No acute infiltrate or pulmonary edema. There is prior vertebroplasty at T12 level. Stable mild compression deformity of T9 vertebral body. IMPRESSION: No active cardiopulmonary disease. Electronically Signed   By: Lahoma Crocker M.D.   On: 08/30/2015 11:59   Ct Angio Chest Pe W/cm &/or Wo Cm  08/30/2015  EXAM: CT ANGIOGRAPHY CHEST WITH CONTRAST TECHNIQUE: Multidetector CT imaging of the chest was performed using the standard protocol during bolus administration of intravenous contrast. Multiplanar CT image reconstructions and MIPs were obtained to evaluate the vascular anatomy. CONTRAST:  100 mL Isovue 370 IV COMPARISON:  None. FINDINGS: Vascular: Right arm IV contrast administration. SVC patent. Right atrium nondilated. RV/LV ratio 1.3, elevated. There are central, segmental and subsegmental emboli in pulmonary artery branches to right upper, middle, lower, and left lower lobes. Patent bilateral pulmonary veins drain into the left atrium. Scattered coronary calcifications. There is good contrast opacification of the thoracic aorta. Mild dilatation of the ascending segment up to 4 cm transverse diameter proximally, returning to normal caliber distally through the arch and descending segment. Scattered arch and descending thoracic aortic calcifications. No dissection or stenosis. Classic 3 vessel brachiocephalic arterial origin anatomy without proximal stenosis. Mediastinum/Lymph Nodes: No masses or pathologically enlarged lymph nodes identified. No pericardial effusion. Lungs/Pleura: No pulmonary mass, infiltrate, or effusion. Upper abdomen: No acute findings. 19 mm subpleural fluid attenuation lesion in hepatic segment 7 possibly cyst. Musculoskeletal: Remote T12  compression fracture post kyphoplasty/ vertebroplasty. Stable mild T9 compression deformity. Mild spondylitic change in the visualized lower cervical spine. Review of the MIP images confirms the above findings. IMPRESSION: 1. Bilateral acute PE with CT evidence of right heart strain (RV/LV Ratio = 1.3) consistent with at least submassive (intermediate risk) PE. The presence of right heart strain has been associated with an increased risk of morbidity and mortality. Please activate Code PE by paging [phone]. Critical Value/emergent results were called by telephone at the time of interpretation on 08/30/2015 at 12:54 pm to Dr. Francine Graven , who verbally acknowledged these results.  2. Atherosclerosis, including aortic and coronary artery disease. Please note that although the presence of coronary artery calcium documents the presence of coronary artery disease, the severity of this disease and any potential stenosis cannot be assessed on this non-gated CT examination. Assessment for potential risk factor modification, dietary therapy or pharmacologic therapy may be warranted, if clinically indicated. 3. 4 cm ascending thoracic aortic aneurysm without complicating features. Recommend annual imaging followup by CTA or MRA. This recommendation follows 2010 ACCF/AHA/AATS/ACR/ASA/SCA/SCAI/SIR/STS/SVM Guidelines for the Diagnosis and Management of Patients with Thoracic Aortic Disease. Circulation. 2010; 121: K446-X507 4. Old T9 and T12 compression deformities. Electronically Signed   By: Lucrezia Europe M.D.   On: 08/30/2015 12:56    EKG: Independently reviewed.   Active Problems:   Pulmonary emboli (HCC)   Acute massive pulmonary embolism (HCC)   Assessment/Plan 1. Acute bilateral pulmonary emboli with right heart strain 2. 4cm Dilatation of the ascendinig aorta 3. Hypertension   Admit patient to step down unit  Heparin drip as per protocol, and transition to oral anticoagulant  Monitor closely for  decompensation  Optimize BP  Monitor ascending aortic (as per protocol)   Further management will depend on hospital course.  DVT prophylaxis: on full anticoagulation Code Status: Full Family Communication: Friend (No family present) Disposition Plan: Home eventually.   Consults called: None. ER consulted critical care. Low threshold to also consult Hematology.   Admission status: Inpatient    Time spent: 60 minutes  Dana Allan, MD  [facility] Pager #: [phone] 7PM-7AM contact night coverage as above   08/30/2015, 2:58 PM

## 2015-08-30 NOTE — Progress Notes (Signed)
VASCULAR LAB PRELIMINARY  PRELIMINARY  PRELIMINARY  PRELIMINARY  Bilateral lower extremity venous duplex completed.    Preliminary report:  There is no obvious evidence of DVT or SVT noted in the bilateral lower extremities.  There is some sluggish flow noted at the saphenofemoral junction.   Amear Strojny, RVT 08/30/2015, 3:41 PM

## 2015-08-30 NOTE — Progress Notes (Signed)
ANTICOAGULATION CONSULT NOTE - Initial Consult  Pharmacy Consult for IV heparin Indication: pulmonary embolus  Allergies  Allergen Reactions  . Gadolinium      Desc: pt states she had nausea after receiving MAGNEVIST for breast imaging   . Pravastatin Other (See Comments)    Locked jaw, weakness  . Simvastatin Other (See Comments)    Weakness and locked jaw.  Marland Kitchen Crestor [Rosuvastatin Calcium] Other (See Comments)    Abdominal discomfort  . Penicillins Swelling and Rash    Has patient had a PCN reaction causing immediate rash, facial/tongue/throat swelling, SOB or lightheadedness with hypotension: yes Has patient had a PCN reaction causing severe rash involving mucus membranes or skin necrosis: unknown Has patient had a PCN reaction that required hospitalization: unknown Has patient had a PCN reaction occurring within the last 10 years: no If all of the above answers are "NO", then may proceed with Cephalosporin use.     Patient Measurements: Weight: 223 lb 14.4 oz (101.56 kg)  Heparin dosing weight: 86.5 kg  Vital Signs: Temp: 98 F (36.7 C) (06/10 1107) Temp Source: Oral (06/10 1107) BP: 143/71 mmHg (06/10 1200) Pulse Rate: 86 (06/10 1200)  Labs:  Recent Labs  08/30/15 1130  HGB 14.3  HCT 40.2  PLT 184  CREATININE 0.60    Estimated Creatinine Clearance: 68.8 mL/min (by C-G formula based on Cr of 0.6).   Medical History: Past Medical History  Diagnosis Date  . Hypertension   . Dyslipidemia   . Diabetes mellitus   . Arthritis   . GERD (gastroesophageal reflux disease)   . Heart murmur   . Breast cancer ([place]) 1994    left sided cancer; unilateral mastectomy, no chemo or radiation  . BRCA2 positive 10/214    Medications:  Scheduled:  Infusions:   Assessment: 82 yo female presents to ER with increased SOB of last couple of days with bilateral lower leg edema to be started on heparin per pharmacy for new bilateral PE  Goal of Therapy:  Heparin level  0.3-0.7 units/ml Monitor platelets by anticoagulation protocol: Yes   Plan:  1) IV heparin bolus of 2500 units 2) IV heparin infusion rate of 1300 units/hr 3) Check heparin level 8 hours after start of IV heparin 4) Daily CBC and DHL   Adrian Saran, PharmD, BCPS Pager [phone] 08/30/2015 1:27 PM

## 2015-08-30 NOTE — ED Provider Notes (Signed)
CSN: [id number]     Arrival date & time 08/30/15  1056 History   First MD Initiated Contact with Patient 08/30/15 1115     Chief Complaint  Patient presents with  . Shortness of Breath  . Chest Pain  . Leg Swelling      HPI Pt was seen at 1130. Per pt, c/o gradual onset and persistence of constant SOB and peripheral edema RLE > LLE that began after travelling by car out of state 4 to 5 days ago; worse since yesterday. Has been associated with mild CP and "dry" cough. Denies palpitations, no abd pain, no N/V/D, no back pain, no fevers, no rash.   Past Medical History  Diagnosis Date  . Hypertension   . Dyslipidemia   . Diabetes mellitus   . Arthritis   . GERD (gastroesophageal reflux disease)   . Heart murmur   . Breast cancer ([place]) 1994    left sided cancer; unilateral mastectomy, no chemo or radiation  . BRCA2 positive 10/214   Past Surgical History  Procedure Laterality Date  . Cardiac catheterization      NORMAL CORONARY ARTERIES  . Mastectomy      LEFT BREAST  . Cholecystectomy    . Tonsillectomy     Family History  Problem Relation Age of Onset  . Heart attack Maternal Grandmother   . Pancreatic cancer Mother 32  . Bladder Cancer Father 83    heavy smoker and drinker  . Ovarian cancer Sister 20  . Hypertension Sister   . Lung cancer Brother 72  . Ovarian cancer Maternal Aunt 62  . Stomach cancer Maternal Grandfather   . Bone cancer Paternal Grandmother     unsure if this was a primary cancer  . Stomach cancer Paternal Grandfather   . Lung cancer Maternal Aunt     died in her 70s; former smoker  . Breast cancer Cousin     dx in her late 56s to 67s; paternal cousin   Social History  Substance Use Topics  . Smoking status: Never Smoker   . Smokeless tobacco: Never Used  . Alcohol Use: No    Review of Systems ROS: Statement: All systems negative except as marked or noted in the HPI; Constitutional: Negative for fever and chills. ; ; Eyes: Negative for  eye pain, redness and discharge. ; ; ENMT: Negative for ear pain, hoarseness, nasal congestion, sinus pressure and sore throat. ; ; Cardiovascular: Negative for palpitations, diaphoresis. +CP, dyspnea and peripheral edema. ; ; Respiratory: +cough. Negative for wheezing and stridor. ; ; Gastrointestinal: Negative for nausea, vomiting, diarrhea, abdominal pain, blood in stool, hematemesis, jaundice and rectal bleeding. . ; ; Genitourinary: Negative for dysuria, flank pain and hematuria. ; ; Musculoskeletal: Negative for back pain and neck pain. Negative for swelling and trauma.; ; Skin: Negative for pruritus, rash, abrasions, blisters, bruising and skin lesion.; ; Neuro: Negative for headache, lightheadedness and neck stiffness. Negative for weakness, altered level of consciousness, altered mental status, extremity weakness, paresthesias, involuntary movement, seizure and syncope.      Allergies  Gadolinium; Pravastatin; Simvastatin; Crestor; and Penicillins  Home Medications   Prior to Admission medications   Medication Sig Start Date End Date Taking? Authorizing Provider  benazepril-hydrochlorthiazide (LOTENSIN HCT) 20-12.5 MG tablet TAKE 1 TABLET ONCE DAILY. 07/30/15  Yes [name], MD  ciprofloxacin (CIPRO) 500 MG tablet Take 1 tablet (500 mg total) by mouth 2 (two) times daily. 08/19/15  Yes [name], MD  isosorbide mononitrate (IMDUR)  30 MG 24 hr tablet TAKE 1 TABLET ONCE DAILY. 04/02/15  Yes [name], MD  pantoprazole (PROTONIX) 40 MG tablet TAKE 1 TABLET ONCE DAILY. 08/25/15  Yes [name], MD  PERCOCET 10-325 MG per tablet Take 0.25-1 tablets by mouth every 6 (six) hours as needed for pain.  02/19/14  Yes Historical Provider, MD  polyethylene glycol (MIRALAX / GLYCOLAX) packet Take 17 g by mouth 3 (three) times daily as needed. Patient taking differently: Take 17 g by mouth 3 (three) times daily as needed for moderate constipation.  05/26/12  Yes Wandra Arthurs, MD  potassium chloride (K-DUR) 10  MEQ tablet Take 2 tablets daily Patient taking differently: Take 20 mEq by mouth daily.  01/30/15  Yes [name], MD  SYNTHROID 100 MCG tablet TAKE 1 TABLET ONCE A DAY SIX DAYS A WEEK. Patient taking differently: TAKE 1 TABLET ONCE a DAY 04/02/15  Yes [name], MD  Vitamin D, Ergocalciferol, (DRISDOL) 50000 units CAPS capsule TAKE ONE CAPSULE ONCE A WEEK. 06/05/15  Yes [name], MD  VOLTAREN 1 % GEL Apply 2 g topically 4 (four) times daily as needed (pain). To knees 02/06/14  Yes Historical Provider, MD  chlorpheniramine-HYDROcodone Amanda Cockayne PENNKINETIC ER) 10-8 MG/5ML SUER Take 1 teaspooon 2 times daily as needed for cough Patient not taking: Reported on 08/30/2015 07/07/15   [name], MD  pravastatin (PRAVACHOL) 20 MG tablet TAKE 1 TABLET ONCE DAILY. Patient not taking: Reported on 08/30/2015 05/05/15   [name], MD   BP 143/72 mmHg  Pulse 93  Temp(Src) 98 F (36.7 C) (Oral)  Resp 22  SpO2 94%   Patient Vitals for the past 24 hrs:  BP Temp Temp src Pulse Resp SpO2 Weight  08/30/15 1309 - - - - - - 223 lb 14.4 oz (101.56 kg)  08/30/15 1200 143/71 mmHg - - 86 17 95 % -  08/30/15 1130 143/72 mmHg - - 93 22 94 % -  08/30/15 1107 147/76 mmHg 98 F (36.7 C) Oral 100 15 96 % -     Physical Exam  1135: Physical examination:  Nursing notes reviewed; Vital signs and O2 SAT reviewed;  Constitutional: Well developed, Well nourished, Well hydrated, In no acute distress; Head:  Normocephalic, atraumatic; Eyes: EOMI, PERRL, No scleral icterus; ENMT: Mouth and pharynx normal, Mucous membranes moist; Neck: Supple, Full range of motion, No lymphadenopathy; Cardiovascular: Regular rate and rhythm, No gallop; Respiratory: Breath sounds clear & equal bilaterally, No wheezes.  Speaking full sentences with ease, Normal respiratory effort/excursion; Chest: Nontender, Movement normal; Abdomen: Soft, Nontender, Nondistended, Normal bowel sounds; Genitourinary: No CVA tenderness; Extremities: Pulses  normal, No tenderness, +tr edema LLE, +1 edema RLE with calf asymmetry.; Neuro: AA&Ox3, Major CN grossly intact.  Speech clear. No gross focal motor or sensory deficits in extremities.; Skin: Color normal, Warm, Dry.   ED Course  Procedures (including critical care time) Labs Review  Imaging Review  I have personally reviewed and evaluated these images and lab results as part of my medical decision-making.   EKG Interpretation   Date/Time:  Saturday August 30 2015 11:07:48 EDT Ventricular Rate:  97 PR Interval:  152 QRS Duration: 81 QT Interval:  363 QTC Calculation: 461 R Axis:   -46 Text Interpretation:  Sinus rhythm LAD, consider left anterior fascicular  block Low voltage, precordial leads Baseline wander When compared with ECG  of 02/18/2010 No significant change was found Confirmed by [facility]  MD,  Necola Bluestein ([id number]) on 08/30/2015 11:42:33 AM  MDM  MDM Reviewed: previous chart, nursing note and vitals Reviewed previous: labs and ECG Interpretation: labs, ECG, x-ray, CT scan and ultrasound Total time providing critical care: 30-74 minutes. This excludes time spent performing separately reportable procedures and services. Consults: critical care and admitting MD     .CRITICAL CARE Performed by: Alfonzo Feller Total critical care time: 35 minutes Critical care time was exclusive of separately billable procedures and treating other patients. Critical care was necessary to treat or prevent imminent or life-threatening deterioration. Critical care was time spent personally by me on the following activities: development of treatment plan with patient and/or surrogate as well as nursing, discussions with consultants, evaluation of patient's response to treatment, examination of patient, obtaining history from patient or surrogate, ordering and performing treatments and interventions, ordering and review of laboratory studies, ordering and review of radiographic studies,  pulse oximetry and re-evaluation of patient's condition.   Results for orders placed or performed during the hospital encounter of 40/98/11  Basic metabolic panel  Result Value Ref Range   Sodium 138 135 - 145 mmol/L   Potassium 3.1 (L) 3.5 - 5.1 mmol/L   Chloride 99 (L) 101 - 111 mmol/L   CO2 29 22 - 32 mmol/L   Glucose, Bld 149 (H) 65 - 99 mg/dL   BUN 10 6 - 20 mg/dL   Creatinine, Ser 0.60 0.44 - 1.00 mg/dL   Calcium 8.7 (L) 8.9 - 10.3 mg/dL   GFR calc non Af Amer >60 >60 mL/min   GFR calc Af Amer >60 >60 mL/min   Anion gap 10 5 - 15  CBC  Result Value Ref Range   WBC 14.7 (H) 4.0 - 10.5 K/uL   RBC 4.64 3.87 - 5.11 MIL/uL   Hemoglobin 14.3 12.0 - 15.0 g/dL   HCT 40.2 36.0 - 46.0 %   MCV 86.6 78.0 - 100.0 fL   MCH 30.8 26.0 - 34.0 pg   MCHC 35.6 30.0 - 36.0 g/dL   RDW 13.3 11.5 - 15.5 %   Platelets 184 150 - 400 K/uL  Brain natriuretic peptide  Result Value Ref Range   B Natriuretic Peptide 106.3 (H) 0.0 - 100.0 pg/mL  I-stat troponin, ED  Result Value Ref Range   Troponin i, poc 0.01 0.00 - 0.08 ng/mL   Comment 3           Dg Chest 2 View 08/30/2015  CLINICAL DATA:  Chest pain, shortness of breath, leg swelling EXAM: CHEST  2 VIEW COMPARISON:  05/26/2012 FINDINGS: Cardiomediastinal silhouette is stable. Mild elevation of the right hemidiaphragm again noted. No acute infiltrate or pulmonary edema. There is prior vertebroplasty at T12 level. Stable mild compression deformity of T9 vertebral body. IMPRESSION: No active cardiopulmonary disease. Electronically Signed   By: Lahoma Crocker M.D.   On: 08/30/2015 11:59   Ct Angio Chest Pe W/cm &/or Wo Cm 08/30/2015  EXAM: CT ANGIOGRAPHY CHEST WITH CONTRAST TECHNIQUE: Multidetector CT imaging of the chest was performed using the standard protocol during bolus administration of intravenous contrast. Multiplanar CT image reconstructions and MIPs were obtained to evaluate the vascular anatomy. CONTRAST:  100 mL Isovue 370 IV COMPARISON:  None.  FINDINGS: Vascular: Right arm IV contrast administration. SVC patent. Right atrium nondilated. RV/LV ratio 1.3, elevated. There are central, segmental and subsegmental emboli in pulmonary artery branches to right upper, middle, lower, and left lower lobes. Patent bilateral pulmonary veins drain into the left atrium. Scattered coronary calcifications. There is good contrast opacification of  the thoracic aorta. Mild dilatation of the ascending segment up to 4 cm transverse diameter proximally, returning to normal caliber distally through the arch and descending segment. Scattered arch and descending thoracic aortic calcifications. No dissection or stenosis. Classic 3 vessel brachiocephalic arterial origin anatomy without proximal stenosis. Mediastinum/Lymph Nodes: No masses or pathologically enlarged lymph nodes identified. No pericardial effusion. Lungs/Pleura: No pulmonary mass, infiltrate, or effusion. Upper abdomen: No acute findings. 19 mm subpleural fluid attenuation lesion in hepatic segment 7 possibly cyst. Musculoskeletal: Remote T12 compression fracture post kyphoplasty/ vertebroplasty. Stable mild T9 compression deformity. Mild spondylitic change in the visualized lower cervical spine. Review of the MIP images confirms the above findings. IMPRESSION: 1. Bilateral acute PE with CT evidence of right heart strain (RV/LV Ratio = 1.3) consistent with at least submassive (intermediate risk) PE. The presence of right heart strain has been associated with an increased risk of morbidity and mortality. Please activate Code PE by paging [phone]. Critical Value/emergent results were called by telephone at the time of interpretation on 08/30/2015 at 12:54 pm to Dr. Francine Graven , who verbally acknowledged these results. 2. Atherosclerosis, including aortic and coronary artery disease. Please note that although the presence of coronary artery calcium documents the presence of coronary artery disease, the severity  of this disease and any potential stenosis cannot be assessed on this non-gated CT examination. Assessment for potential risk factor modification, dietary therapy or pharmacologic therapy may be warranted, if clinically indicated. 3. 4 cm ascending thoracic aortic aneurysm without complicating features. Recommend annual imaging followup by CTA or MRA. This recommendation follows 2010 ACCF/AHA/AATS/ACR/ASA/SCA/SCAI/SIR/STS/SVM Guidelines for the Diagnosis and Management of Patients with Thoracic Aortic Disease. Circulation. 2010; 121: H474-Q595 4. Old T9 and T12 compression deformities. Electronically Signed   By: Lucrezia Europe M.D.   On: 08/30/2015 12:56    1310:  IV heparin started. Vasc US pending. Dx and testing d/w pt and family.  Questions answered.  Verb understanding, agreeable to admit.   T/C to PCCM Dr. Lake Bells, case discussed, including:  HPI, pertinent PM/SHx, VS/PE, dx testing, ED course and treatment:  Pt can be admitted to [facility], they can consult prn. T/C to [facility] Dr. Marthenia Rolling, case discussed, including:  HPI, pertinent PM/SHx, VS/PE, dx testing, ED course and treatment:  Agreeable to admit, requests to write temporary orders, obtain stepdown bed to team [facility].   Francine Graven, DO 08/31/15 [phone]

## 2015-08-31 ENCOUNTER — Inpatient Hospital Stay (HOSPITAL_COMMUNITY): Payer: Medicare Other

## 2015-08-31 DIAGNOSIS — I2699 Other pulmonary embolism without acute cor pulmonale: Secondary | ICD-10-CM

## 2015-08-31 LAB — ECHOCARDIOGRAM COMPLETE
Height: 68 in
Weight: 3675.51 oz

## 2015-08-31 LAB — CBC
HCT: 36.7 % (ref 36.0–46.0)
Hemoglobin: 12.8 g/dL (ref 12.0–15.0)
MCH: 31 pg (ref 26.0–34.0)
MCHC: 34.9 g/dL (ref 30.0–36.0)
MCV: 88.9 fL (ref 78.0–100.0)
Platelets: 130 10*3/uL — ABNORMAL LOW (ref 150–400)
RBC: 4.13 MIL/uL (ref 3.87–5.11)
RDW: 13.4 % (ref 11.5–15.5)
WBC: 10.4 10*3/uL (ref 4.0–10.5)

## 2015-08-31 LAB — BASIC METABOLIC PANEL
Anion gap: 7 (ref 5–15)
BUN: 7 mg/dL (ref 6–20)
CO2: 29 mmol/L (ref 22–32)
Calcium: 8.3 mg/dL — ABNORMAL LOW (ref 8.9–10.3)
Chloride: 102 mmol/L (ref 101–111)
Creatinine, Ser: 0.51 mg/dL (ref 0.44–1.00)
GFR calc Af Amer: >60 mL/min (ref >60)
GFR calc non Af Amer: >60 mL/min (ref >60)
Glucose, Bld: 141 mg/dL — ABNORMAL HIGH (ref 65–99)
Potassium: 2.9 mmol/L — ABNORMAL LOW (ref 3.5–5.1)
Sodium: 138 mmol/L (ref 135–145)

## 2015-08-31 LAB — TROPONIN I: Troponin I: <0.03 ng/mL (ref <0.031)

## 2015-08-31 MED ORDER — POTASSIUM CHLORIDE CRYS ER 20 MEQ PO TBCR
40.0000 meq | EXTENDED_RELEASE_TABLET | Freq: Once | ORAL | Status: AC
  Administered 2015-08-31: 40 meq via ORAL
  Filled 2015-08-31: qty 2

## 2015-08-31 MED ORDER — AMLODIPINE BESYLATE 10 MG PO TABS
10.0000 mg | ORAL_TABLET | Freq: Every day | ORAL | Status: DC
  Administered 2015-09-01 – 2015-09-02 (×2): 10 mg via ORAL
  Filled 2015-08-31 (×3): qty 1

## 2015-08-31 MED ORDER — HYDRALAZINE HCL 20 MG/ML IJ SOLN
10.0000 mg | Freq: Four times a day (QID) | INTRAMUSCULAR | Status: DC | PRN
  Administered 2015-09-01: 10 mg via INTRAVENOUS
  Filled 2015-08-31: qty 1

## 2015-08-31 MED ORDER — POTASSIUM CHLORIDE 10 MEQ/100ML IV SOLN: 10.0000 meq | INTRAVENOUS | Status: DC

## 2015-08-31 MED ORDER — APIXABAN 5 MG PO TABS
10.0000 mg | ORAL_TABLET | Freq: Two times a day (BID) | ORAL | Status: DC
  Administered 2015-08-31 – 2015-09-02 (×5): 10 mg via ORAL
  Filled 2015-08-31 (×5): qty 2

## 2015-08-31 MED ORDER — SODIUM CHLORIDE 0.9 % IV SOLN
INTRAVENOUS | Status: DC
  Administered 2015-08-31: 08:00:00 via INTRAVENOUS

## 2015-08-31 MED ORDER — APIXABAN 5 MG PO TABS: 5.0000 mg | ORAL_TABLET | Freq: Two times a day (BID) | ORAL | Status: DC

## 2015-08-31 MED ORDER — PERFLUTREN LIPID MICROSPHERE
1.0000 mL | INTRAVENOUS | Status: AC | PRN
  Administered 2015-08-31: 3 mL via INTRAVENOUS
  Filled 2015-08-31: qty 10

## 2015-08-31 NOTE — Progress Notes (Signed)
ANTICOAGULATION CONSULT NOTE - Initial Consult  Pharmacy Consult for IV heparin -> apixaban Indication: pulmonary embolus  Allergies  Allergen Reactions  . Gadolinium      Desc: pt states she had nausea after receiving MAGNEVIST for breast imaging   . Pravastatin Other (See Comments)    Locked jaw, weakness  . Simvastatin Other (See Comments)    Weakness and locked jaw.  Marland Kitchen Crestor [Rosuvastatin Calcium] Other (See Comments)    Abdominal discomfort  . Penicillins Swelling and Rash    Has patient had a PCN reaction causing immediate rash, facial/tongue/throat swelling, SOB or lightheadedness with hypotension: yes Has patient had a PCN reaction causing severe rash involving mucus membranes or skin necrosis: unknown Has patient had a PCN reaction that required hospitalization: unknown Has patient had a PCN reaction occurring within the last 10 years: no If all of the above answers are "NO", then may proceed with Cephalosporin use.     Patient Measurements: Height: '5\' 8"'  (172.7 cm) Weight: 229 lb 11.5 oz (104.2 kg) IBW/kg (Calculated) : 63.9  Heparin dosing weight: 86.5 kg  Vital Signs: Temp: 98.1 F (36.7 C) (06/11 0441) Temp Source: Oral (06/11 0441) BP: 131/74 mmHg (06/11 0441) Pulse Rate: 62 (06/11 0441)  Labs:  Recent Labs  08/30/15 1130 08/30/15 1406 08/30/15 1500 08/30/15 2157 08/31/15 0329  HGB 14.3  --   --   --  12.8  HCT 40.2  --   --   --  36.7  PLT 184  --   --   --  130*  HEPARINUNFRC  --  0.60  --  0.32  --   CREATININE 0.60  --   --   --  0.51  TROPONINI  --   --  <0.03 <0.03 <0.03    Estimated Creatinine Clearance: 69.7 mL/min (by C-G formula based on Cr of 0.51).   Medical History: Past Medical History  Diagnosis Date  . Hypertension   . Dyslipidemia   . Diabetes mellitus   . Arthritis   . GERD (gastroesophageal reflux disease)   . Heart murmur   . Breast cancer ([place]) 1994    left sided cancer; unilateral mastectomy, no chemo or  radiation  . BRCA2 positive 10/214   Assessment: 82 yo female presents to ER with increased SOB of last couple of days with bilateral lower leg edema to be started on heparin per pharmacy for new bilateral PE. CT confirmed PE w/ evidence of R heart strain.  Today, 08/31/2015: - Switching from heparin to apixaban.  Goal of Therapy:  Monitor platelets by anticoagulation protocol: Yes   Plan:   At 10am, stop heparin infusion and give first dose of apixaban. Communicated this plan to the patient's nurse.   Apixaban regimen for acute PE - 39m bid x 7d then 556mbid.  Educate pateint/caregivers on apixaban therapy.  ToRomeo RabonPharmD, pager [phone]/01/2016,8:19 AM.

## 2015-08-31 NOTE — Progress Notes (Signed)
ANTICOAGULATION CONSULT NOTE - Follow Up Consult  Pharmacy Consult for Heparin Indication: pulmonary embolus  Allergies  Allergen Reactions  . Gadolinium      Desc: pt states she had nausea after receiving MAGNEVIST for breast imaging   . Pravastatin Other (See Comments)    Locked jaw, weakness  . Simvastatin Other (See Comments)    Weakness and locked jaw.  Marland Kitchen Crestor [Rosuvastatin Calcium] Other (See Comments)    Abdominal discomfort  . Penicillins Swelling and Rash    Has patient had a PCN reaction causing immediate rash, facial/tongue/throat swelling, SOB or lightheadedness with hypotension: yes Has patient had a PCN reaction causing severe rash involving mucus membranes or skin necrosis: unknown Has patient had a PCN reaction that required hospitalization: unknown Has patient had a PCN reaction occurring within the last 10 years: no If all of the above answers are "NO", then may proceed with Cephalosporin use.     Patient Measurements: Height: 5\' 8"  (172.7 cm) Weight: 229 lb 11.5 oz (104.2 kg) IBW/kg (Calculated) : 63.9 Heparin Dosing Weight:   Vital Signs: Temp: 98.2 F (36.8 C) (06/10 2000) Temp Source: Oral (06/10 2000) BP: 154/68 mmHg (06/10 2200) Pulse Rate: 74 (06/10 2200)  Labs:  Recent Labs  08/30/15 1130 08/30/15 1406 08/30/15 1500 08/30/15 2157  HGB 14.3  --   --   --   HCT 40.2  --   --   --   PLT 184  --   --   --   HEPARINUNFRC  --  0.60  --  0.32  CREATININE 0.60  --   --   --   TROPONINI  --   --  <0.03 <0.03    Estimated Creatinine Clearance: 69.7 mL/min (by C-G formula based on Cr of 0.6).   Medications:  Infusions:  . heparin 1,300 Units/hr (08/30/15 1357)    Assessment: Patient with heparin at goal but on lower end of goal.  No heparin issues per RN.  Goal of Therapy:  Heparin level 0.3-0.7 units/ml Monitor platelets by anticoagulation protocol: Yes   Plan:  Increase heparin to 1400 units/hr Recheck level at  0900  Tyler Deis, Shea Stakes [place] 08/31/2015,12:17 AM

## 2015-08-31 NOTE — Progress Notes (Signed)
PROGRESS NOTE                                                                                                                                                                                                             Patient Demographics:    Angela [name], is a 82 y.o. female, DOB - Dec 25, 1932, [id number]  Admit date - 08/30/2015   Admitting Physician Angela Public, MD  Outpatient Primary MD for the patient is Angela Snare, MD  LOS - 1  Chief Complaint  Patient presents with  . Shortness of Breath  . Chest Pain  . Leg Swelling       Brief Narrative    82 year old Caucasian with history of Hypertension, DM, Hyperlipidemia, left breast cancer, GERD and arthritis. Patient presents with history of bilateral leg swelling, right worse than left and SOB. Patient developed SOB after [place]. CTA of the chest done revealed bilateral acute PE with CT evidence of right heart strain. BP has remained stable. Doppler ultrasound of the lower extremities is still pending. On further questioning, patient tells me that she had DVT about 50 years ago but could not remember the details. History of breast cancer is noted as documented above. No headache, no neck pain, no chest pain, no palpitation, no GI symptoms and no urinary symptoms.  ED Course: Patient was started on Heparin drip.  Pertinent labs: CTA chest reveals bilateral PE with CT evidence of right heart strain and 4cm dilatation of the ascending aorta.   Subjective:    Angela [name] today has, No headache, No chest pain, No abdominal pain - No Nausea, No new weakness tingling or numbness, No Cough - SOB. She feels pretty good and almost completely symptom free at this time.   Assessment  & Plan :     1. Acute Bilateral PE - Submassive by CT criteria, EKG nonacute, troponin negative, currently symptom free. Switched from Heparin  to Eliquis, lower extremity venous duplex unremarkable, check echogram. Increase activity. She is already oxygen free. If all stable discharge in the morning.  2. Incidental finding of ascending Aortic aneurysm, with evidence of atherosclerosis. Outpatient follow-up with PCP. Secondary risk factor modulation, repeat CT angiogram within one year. Consider one-time outpatient cardiothoracic surgery follow-up. Will defer attachment of this issue to  PCP  3. Essential Hypertension. Continue home medications which include Imdur, Norvasc and HCTZ.   4.GERD. On PPI   5. Hypothyroidism. Continue home dose Synthroid.    Code Status :  Full  Family Communication  :  None  Disposition Plan  :  Home in the morning if stable  Consults  :  None  Procedures  :    Echogram.   Lower extremity venous duplex. No DVT SVT.   CT angiogram chest. Positive for PE and AAA along with CAD.  DVT Prophylaxis  : [place]/Eliquis  Lab Results  Component Value Date   PLT 130* 08/31/2015    Inpatient Medications  Scheduled Meds: . amLODipine  10 mg Oral Daily  . apixaban  10 mg Oral BID   Followed by  . [START ON 09/07/2015] apixaban  5 mg Oral BID  . benazepril  20 mg Oral Daily   And  . hydrochlorothiazide  12.5 mg Oral Daily  . isosorbide mononitrate  30 mg Oral Daily  . levothyroxine  100 mcg Oral QAC breakfast  . pantoprazole  40 mg Oral Daily  . potassium chloride  40 mEq Oral Once   Continuous Infusions: . heparin 1,400 Units/hr (08/31/15 0441)   PRN Meds:.hydrALAZINE, oxyCODONE-acetaminophen  Antibiotics  :    Anti-infectives    None         Objective:   Filed Vitals:   08/31/15 0000 08/31/15 0200 08/31/15 0441 08/31/15 0800  BP: 152/56 138/72 131/74 140/72  Pulse: 77 73 62 76  Temp:   98.1 F (36.7 C) 98.2 F (36.8 C)  TempSrc:   Oral Oral  Resp: 21 16 15 16   Height:      Weight:      SpO2: 97% 95% 95% 98%    Wt Readings from Last 3 Encounters:  08/30/15 104.2 kg  (229 lb 11.5 oz)  01/07/15 97.523 kg (215 lb)  09/01/12 97.886 kg (215 lb 12.8 oz)     Intake/Output Summary (Last 24 hours) at 08/31/15 0957 Last data filed at 08/31/15 0800  Gross per 24 hour  Intake 1502.17 ml  Output      2 ml  Net 1500.17 ml     Physical Exam  Awake Alert, Oriented X 3, No new F.N deficits, Normal affect [place].AT,PERRAL Supple Neck,No JVD, No cervical lymphadenopathy appriciated.  Symmetrical Chest wall movement, Good air movement bilaterally, CTAB RRR,No Gallops,Rubs or new Murmurs, No Parasternal Heave +ve B.Sounds, Abd Soft, No tenderness, No organomegaly appriciated, No rebound - guarding or rigidity. No Cyanosis, Clubbing or edema, No new Rash or bruise       Data Review:    CBC  Recent Labs Lab 08/30/15 1130 08/31/15 0329  WBC 14.7* 10.4  HGB 14.3 12.8  HCT 40.2 36.7  PLT 184 130*  MCV 86.6 88.9  MCH 30.8 31.0  MCHC 35.6 34.9  RDW 13.3 13.4    Chemistries   Recent Labs Lab 08/30/15 1130 08/31/15 0329  NA 138 138  K 3.1* 2.9*  CL 99* 102  CO2 29 29  GLUCOSE 149* 141*  BUN 10 7  CREATININE 0.60 0.51  CALCIUM 8.7* 8.3*   ------------------------------------------------------------------------------------------------------------------ No results for input(s): CHOL, HDL, LDLCALC, TRIG, CHOLHDL, LDLDIRECT in the last 72 hours.  Lab Results  Component Value Date   HGBA1C 5.9 07/29/2015   ------------------------------------------------------------------------------------------------------------------ No results for input(s): TSH, T4TOTAL, T3FREE, THYROIDAB in the last 72 hours.  Invalid input(s): FREET3 ------------------------------------------------------------------------------------------------------------------ No results for input(s): VITAMINB12, FOLATE, FERRITIN,  TIBC, IRON, RETICCTPCT in the last 72 hours.  Coagulation profile No results for input(s): INR, PROTIME in the last 168 hours.  No results for input(s):  DDIMER in the last 72 hours.  Cardiac Enzymes  Recent Labs Lab 08/30/15 1500 08/30/15 2157 08/31/15 0329  TROPONINI <0.03 <0.03 <0.03   ------------------------------------------------------------------------------------------------------------------    Component Value Date/Time   BNP 106.3* 08/30/2015 1130    Micro Results Recent Results (from the past 240 hour(s))  MRSA PCR Screening     Status: None   Collection Time: 08/30/15  4:00 PM  Result Value Ref Range Status   MRSA by PCR NEGATIVE NEGATIVE Final    Comment:        The GeneXpert MRSA Assay (FDA approved for NASAL specimens only), is one component of a comprehensive MRSA colonization surveillance program. It is not intended to diagnose MRSA infection nor to guide or monitor treatment for MRSA infections.     Radiology Reports Dg Chest 2 View  08/30/2015  CLINICAL DATA:  Chest pain, shortness of breath, leg swelling EXAM: CHEST  2 VIEW COMPARISON:  05/26/2012 FINDINGS: Cardiomediastinal silhouette is stable. Mild elevation of the right hemidiaphragm again noted. No acute infiltrate or pulmonary edema. There is prior vertebroplasty at T12 level. Stable mild compression deformity of T9 vertebral body. IMPRESSION: No active cardiopulmonary disease. Electronically Signed   By: Lahoma Crocker M.D.   On: 08/30/2015 11:59   Ct Angio Chest Pe W/cm &/or Wo Cm  08/30/2015  EXAM: CT ANGIOGRAPHY CHEST WITH CONTRAST TECHNIQUE: Multidetector CT imaging of the chest was performed using the standard protocol during bolus administration of intravenous contrast. Multiplanar CT image reconstructions and MIPs were obtained to evaluate the vascular anatomy. CONTRAST:  100 mL Isovue 370 IV COMPARISON:  None. FINDINGS: Vascular: Right arm IV contrast administration. SVC patent. Right atrium nondilated. RV/LV ratio 1.3, elevated. There are central, segmental and subsegmental emboli in pulmonary artery branches to right upper, middle, lower, and  left lower lobes. Patent bilateral pulmonary veins drain into the left atrium. Scattered coronary calcifications. There is good contrast opacification of the thoracic aorta. Mild dilatation of the ascending segment up to 4 cm transverse diameter proximally, returning to normal caliber distally through the arch and descending segment. Scattered arch and descending thoracic aortic calcifications. No dissection or stenosis. Classic 3 vessel brachiocephalic arterial origin anatomy without proximal stenosis. Mediastinum/Lymph Nodes: No masses or pathologically enlarged lymph nodes identified. No pericardial effusion. Lungs/Pleura: No pulmonary mass, infiltrate, or effusion. Upper abdomen: No acute findings. 19 mm subpleural fluid attenuation lesion in hepatic segment 7 possibly cyst. Musculoskeletal: Remote T12 compression fracture post kyphoplasty/ vertebroplasty. Stable mild T9 compression deformity. Mild spondylitic change in the visualized lower cervical spine. Review of the MIP images confirms the above findings. IMPRESSION: 1. Bilateral acute PE with CT evidence of right heart strain (RV/LV Ratio = 1.3) consistent with at least submassive (intermediate risk) PE. The presence of right heart strain has been associated with an increased risk of morbidity and mortality. Please activate Code PE by paging [phone]. Critical Value/emergent results were called by telephone at the time of interpretation on 08/30/2015 at 12:54 pm to Dr. Francine Graven , who verbally acknowledged these results. 2. Atherosclerosis, including aortic and coronary artery disease. Please note that although the presence of coronary artery calcium documents the presence of coronary artery disease, the severity of this disease and any potential stenosis cannot be assessed on this non-gated CT examination. Assessment for potential risk factor modification, dietary therapy  or pharmacologic therapy may be warranted, if clinically indicated. 3. 4 cm  ascending thoracic aortic aneurysm without complicating features. Recommend annual imaging followup by CTA or MRA. This recommendation follows 2010 ACCF/AHA/AATS/ACR/ASA/SCA/SCAI/SIR/STS/SVM Guidelines for the Diagnosis and Management of Patients with Thoracic Aortic Disease. Circulation. 2010; 121: [id number] 4. Old T9 and T12 compression deformities. Electronically Signed   By: Lucrezia Europe M.D.   On: 08/30/2015 12:56    Time Spent in minutes  30   Samyuktha Brau K M.D on 08/31/2015 at 9:57 AM  Between 7am to 7pm - Pager - [phone]  After 7pm go to [email or web address] - password [facility]  [facility] -  Office  [phone]

## 2015-08-31 NOTE — Progress Notes (Signed)
Echocardiogram 2D Echocardiogram with Definity has been performed.  Tresa Res 08/31/2015, 12:18 PM

## 2015-09-01 ENCOUNTER — Other Ambulatory Visit: Payer: Medicare Other

## 2015-09-01 LAB — BASIC METABOLIC PANEL
ANION GAP: 8 (ref 5–15)
BUN: 8 mg/dL (ref 6–20)
CHLORIDE: 104 mmol/L (ref 101–111)
CO2: 28 mmol/L (ref 22–32)
Calcium: 8.8 mg/dL — ABNORMAL LOW (ref 8.9–10.3)
Creatinine, Ser: 0.45 mg/dL (ref 0.44–1.00)
GFR calc non Af Amer: >60 mL/min (ref >60)
GLUCOSE: 122 mg/dL — AB (ref 65–99)
POTASSIUM: 3.8 mmol/L (ref 3.5–5.1)
Sodium: 140 mmol/L (ref 135–145)

## 2015-09-01 LAB — CBC
HEMATOCRIT: 39.5 % (ref 36.0–46.0)
HEMOGLOBIN: 13.6 g/dL (ref 12.0–15.0)
MCH: 30.4 pg (ref 26.0–34.0)
MCHC: 34.4 g/dL (ref 30.0–36.0)
MCV: 88.2 fL (ref 78.0–100.0)
Platelets: 148 10*3/uL — ABNORMAL LOW (ref 150–400)
RBC: 4.48 MIL/uL (ref 3.87–5.11)
RDW: 13.5 % (ref 11.5–15.5)
WBC: 9.3 10*3/uL (ref 4.0–10.5)

## 2015-09-01 LAB — MAGNESIUM: Magnesium: 1.3 mg/dL — ABNORMAL LOW (ref 1.7–2.4)

## 2015-09-01 MED ORDER — MAGNESIUM SULFATE IN D5W 1-5 GM/100ML-% IV SOLN
1.0000 g | Freq: Once | INTRAVENOUS | Status: AC
  Administered 2015-09-01: 1 g via INTRAVENOUS
  Filled 2015-09-01: qty 100

## 2015-09-01 MED ORDER — MAGNESIUM SULFATE 2 GM/50ML IV SOLN: 2.0000 g | Freq: Once | INTRAVENOUS | Status: DC

## 2015-09-01 MED ORDER — FUROSEMIDE 40 MG PO TABS
40.0000 mg | ORAL_TABLET | Freq: Once | ORAL | Status: AC
  Administered 2015-09-01: 40 mg via ORAL
  Filled 2015-09-01: qty 1

## 2015-09-01 MED ORDER — POTASSIUM CHLORIDE CRYS ER 20 MEQ PO TBCR
40.0000 meq | EXTENDED_RELEASE_TABLET | Freq: Once | ORAL | Status: AC
  Administered 2015-09-01: 40 meq via ORAL
  Filled 2015-09-01: qty 2

## 2015-09-01 NOTE — Progress Notes (Signed)
PROGRESS NOTE                                                                                                                                                                                                             Patient Demographics:    Angela [name], is a 82 y.o. female, DOB - 1932/10/07, [id number]  Admit date - 08/30/2015   Admitting Physician [name], MD  Outpatient Primary MD for the patient is [name], MD  LOS - 2  Chief Complaint  Patient presents with  . Shortness of Breath  . Chest Pain  . Leg Swelling       Brief Narrative    82 year old Caucasian with history of Hypertension, DM, Hyperlipidemia, left breast cancer, GERD and arthritis. Patient presents with history of bilateral leg swelling, right worse than left and SOB. Patient developed SOB after [place]. CTA of the chest done revealed bilateral acute PE with CT evidence of right heart strain. BP has remained stable. Doppler ultrasound of the lower extremities is still pending. On further questioning, patient tells me that she had DVT about 50 years ago but could not remember the details. History of breast cancer is noted as documented above. No headache, no neck pain, no chest pain, no palpitation, no GI symptoms and no urinary symptoms.  ED Course: Patient was started on Heparin drip.  Pertinent labs: CTA chest reveals bilateral PE with CT evidence of right heart strain and 4cm dilatation of the ascending aorta.   Subjective:    [name] today has, No headache, No chest pain, No abdominal pain - No Nausea, No new weakness tingling or numbness, No Cough - She developed mild orthopnea and shortness of breath last night did receive some IV fluids during admission.   Assessment  & Plan :     1. Acute Bilateral PE - Submassive by CT criteria, EKG nonacute, troponin negative, Minimal  shortness of breath. Switched from Heparin to Eliquis, lower extremity venous duplex unremarkable, pending echogram. Increase activity. She is already oxygen free. Requested case management to provide one-month Eliquis coupon thereafter per PCP. Outpatient follow-up with her primary oncologist Dr. Benay Spice.  2. Incidental finding of ascending Aortic aneurysm, with evidence of atherosclerosis. Outpatient follow-up with PCP. Secondary risk factor modulation, repeat CT  angiogram within one year. Consider one-time outpatient cardiothoracic surgery follow-up. Will defer attachment of this issue to PCP  3. Essential Hypertension. Continue home medications which include Imdur, Norvasc and HCTZ.   4. GERD. On PPI   5. Hypothyroidism. Continue home dose Synthroid.  6. Mild acute on chronic diastolic CHF. Recent echo pending, gentle diuresis and monitor.  7. History of breast cancer. Follow with Dr. Blair Promise post discharge.    Code Status :  Full  Family Communication  :  None  Disposition Plan  : Home on 09/02/2015 if shortness of breath better   Consults  :  None  Procedures  :    Echogram.   Lower extremity venous duplex. No DVT SVT.   CT angiogram chest. Positive for PE and AAA along with CAD.  DVT Prophylaxis  Eliquis   Lab Results  Component Value Date   PLT 148* 09/01/2015    Inpatient Medications  Scheduled Meds: . amLODipine  10 mg Oral Daily  . apixaban  10 mg Oral BID   Followed by  . [START ON 09/07/2015] apixaban  5 mg Oral BID  . benazepril  20 mg Oral Daily   And  . hydrochlorothiazide  12.5 mg Oral Daily  . isosorbide mononitrate  30 mg Oral Daily  . levothyroxine  100 mcg Oral QAC breakfast  . magnesium sulfate 1 - 4 g bolus IVPB  1 g Intravenous Once  . magnesium sulfate 1 - 4 g bolus IVPB  2 g Intravenous Once  . pantoprazole  40 mg Oral Daily   Continuous Infusions:   PRN Meds:.hydrALAZINE, oxyCODONE-acetaminophen  Antibiotics  :    Anti-infectives     None         Objective:   Filed Vitals:   08/31/15 2045 09/01/15 0405 09/01/15 0525 09/01/15 0833  BP: 162/75 175/65 162/68 168/77  Pulse: 77 71    Temp: 97.6 F (36.4 C) 97.6 F (36.4 C)    TempSrc: Oral Oral    Resp: 18 18    Height:      Weight:      SpO2: 97% 98%      Wt Readings from Last 3 Encounters:  08/31/15 102.83 kg (226 lb 11.2 oz)  01/07/15 97.523 kg (215 lb)  09/01/12 97.886 kg (215 lb 12.8 oz)     Intake/Output Summary (Last 24 hours) at 09/01/15 0855 Last data filed at 08/31/15 1000  Gross per 24 hour  Intake     14 ml  Output      0 ml  Net     14 ml     Physical Exam  Awake Alert, Oriented X 3, No new F.N deficits, Normal affect [place].AT,PERRAL Supple Neck,No JVD, No cervical lymphadenopathy appriciated.  Symmetrical Chest wall movement, Good air movement bilaterally, CTAB RRR,No Gallops,Rubs or new Murmurs, No Parasternal Heave +ve B.Sounds, Abd Soft, No tenderness, No organomegaly appriciated, No rebound - guarding or rigidity. No Cyanosis, Clubbing or edema, No new Rash or bruise       Data Review:    CBC  Recent Labs Lab 08/30/15 1130 08/31/15 0329 09/01/15 0448  WBC 14.7* 10.4 9.3  HGB 14.3 12.8 13.6  HCT 40.2 36.7 39.5  PLT 184 130* 148*  MCV 86.6 88.9 88.2  MCH 30.8 31.0 30.4  MCHC 35.6 34.9 34.4  RDW 13.3 13.4 13.5    Chemistries   Recent Labs Lab 08/30/15 1130 08/31/15 0329 09/01/15 0448  NA 138 138 140  K 3.1*  2.9* 3.8  CL 99* 102 104  CO2 29 29 28   GLUCOSE 149* 141* 122*  BUN 10 7 8   CREATININE 0.60 0.51 0.45  CALCIUM 8.7* 8.3* 8.8*  MG  --   --  1.3*   ------------------------------------------------------------------------------------------------------------------ No results for input(s): CHOL, HDL, LDLCALC, TRIG, CHOLHDL, LDLDIRECT in the last 72 hours.  Lab Results  Component Value Date   HGBA1C 5.9 07/29/2015    ------------------------------------------------------------------------------------------------------------------ No results for input(s): TSH, T4TOTAL, T3FREE, THYROIDAB in the last 72 hours.  Invalid input(s): FREET3 ------------------------------------------------------------------------------------------------------------------ No results for input(s): VITAMINB12, FOLATE, FERRITIN, TIBC, IRON, RETICCTPCT in the last 72 hours.  Coagulation profile No results for input(s): INR, PROTIME in the last 168 hours.  No results for input(s): DDIMER in the last 72 hours.  Cardiac Enzymes  Recent Labs Lab 08/30/15 1500 08/30/15 2157 08/31/15 0329  TROPONINI <0.03 <0.03 <0.03   ------------------------------------------------------------------------------------------------------------------    Component Value Date/Time   BNP 106.3* 08/30/2015 1130    Micro Results Recent Results (from the past 240 hour(s))  MRSA PCR Screening     Status: None   Collection Time: 08/30/15  4:00 PM  Result Value Ref Range Status   MRSA by PCR NEGATIVE NEGATIVE Final    Comment:        The GeneXpert MRSA Assay (FDA approved for NASAL specimens only), is one component of a comprehensive MRSA colonization surveillance program. It is not intended to diagnose MRSA infection nor to guide or monitor treatment for MRSA infections.     Radiology Reports Dg Chest 2 View  08/30/2015  CLINICAL DATA:  Chest pain, shortness of breath, leg swelling EXAM: CHEST  2 VIEW COMPARISON:  05/26/2012 FINDINGS: Cardiomediastinal silhouette is stable. Mild elevation of the right hemidiaphragm again noted. No acute infiltrate or pulmonary edema. There is prior vertebroplasty at T12 level. Stable mild compression deformity of T9 vertebral body. IMPRESSION: No active cardiopulmonary disease. Electronically Signed   By: Lahoma Crocker M.D.   On: 08/30/2015 11:59   Ct Angio Chest Pe W/cm &/or Wo Cm  08/30/2015  EXAM: CT  ANGIOGRAPHY CHEST WITH CONTRAST TECHNIQUE: Multidetector CT imaging of the chest was performed using the standard protocol during bolus administration of intravenous contrast. Multiplanar CT image reconstructions and MIPs were obtained to evaluate the vascular anatomy. CONTRAST:  100 mL Isovue 370 IV COMPARISON:  None. FINDINGS: Vascular: Right arm IV contrast administration. SVC patent. Right atrium nondilated. RV/LV ratio 1.3, elevated. There are central, segmental and subsegmental emboli in pulmonary artery branches to right upper, middle, lower, and left lower lobes. Patent bilateral pulmonary veins drain into the left atrium. Scattered coronary calcifications. There is good contrast opacification of the thoracic aorta. Mild dilatation of the ascending segment up to 4 cm transverse diameter proximally, returning to normal caliber distally through the arch and descending segment. Scattered arch and descending thoracic aortic calcifications. No dissection or stenosis. Classic 3 vessel brachiocephalic arterial origin anatomy without proximal stenosis. Mediastinum/Lymph Nodes: No masses or pathologically enlarged lymph nodes identified. No pericardial effusion. Lungs/Pleura: No pulmonary mass, infiltrate, or effusion. Upper abdomen: No acute findings. 19 mm subpleural fluid attenuation lesion in hepatic segment 7 possibly cyst. Musculoskeletal: Remote T12 compression fracture post kyphoplasty/ vertebroplasty. Stable mild T9 compression deformity. Mild spondylitic change in the visualized lower cervical spine. Review of the MIP images confirms the above findings. IMPRESSION: 1. Bilateral acute PE with CT evidence of right heart strain (RV/LV Ratio = 1.3) consistent with at least submassive (intermediate risk) PE. The presence  of right heart strain has been associated with an increased risk of morbidity and mortality. Please activate Code PE by paging [phone]. Critical Value/emergent results were called by  telephone at the time of interpretation on 08/30/2015 at 12:54 pm to Dr. Francine Graven , who verbally acknowledged these results. 2. Atherosclerosis, including aortic and coronary artery disease. Please note that although the presence of coronary artery calcium documents the presence of coronary artery disease, the severity of this disease and any potential stenosis cannot be assessed on this non-gated CT examination. Assessment for potential risk factor modification, dietary therapy or pharmacologic therapy may be warranted, if clinically indicated. 3. 4 cm ascending thoracic aortic aneurysm without complicating features. Recommend annual imaging followup by CTA or MRA. This recommendation follows 2010 ACCF/AHA/AATS/ACR/ASA/SCA/SCAI/SIR/STS/SVM Guidelines for the Diagnosis and Management of Patients with Thoracic Aortic Disease. Circulation. 2010; 121: [id number] 4. Old T9 and T12 compression deformities. Electronically Signed   By: Lucrezia Europe M.D.   On: 08/30/2015 12:56    Time Spent in minutes  30   Casin Federici K M.D on 09/01/2015 at 8:55 AM  Between 7am to 7pm - Pager - [phone]  After 7pm go to [email or web address] - password [facility]  [facility] -  Office  [phone]

## 2015-09-01 NOTE — Care Management Note (Signed)
Case Management Note  Patient Details  Name: Angela [name] MRN: [id number] Date of Birth: 07/20/1932  Subjective/Objective: 82 y/o f admitted w/bilateral PE. From home.                   Action/Plan:d/c plan home.   Expected Discharge Date:                Expected Discharge Plan:  Home/Self Care  In-House Referral:     Discharge planning Services  CM Consult  Post Acute Care Choice:    Choice offered to:     DME Arranged:    DME Agency:     HH Arranged:    HH Agency:     Status of Service:  In process, will continue to follow  Medicare Important Message Given:    Date Medicare IM Given:    Medicare IM give by:    Date Additional Medicare IM Given:    Additional Medicare Important Message give by:     If discussed at [place] of Stay Meetings, dates discussed:    Additional Comments:  Dessa Phi, RN 09/01/2015, 3:40 PM

## 2015-09-02 DIAGNOSIS — K210 Gastro-esophageal reflux disease with esophagitis: Secondary | ICD-10-CM

## 2015-09-02 DIAGNOSIS — I2699 Other pulmonary embolism without acute cor pulmonale: Secondary | ICD-10-CM | POA: Insufficient documentation

## 2015-09-02 DIAGNOSIS — E039 Hypothyroidism, unspecified: Secondary | ICD-10-CM

## 2015-09-02 DIAGNOSIS — M170 Bilateral primary osteoarthritis of knee: Secondary | ICD-10-CM | POA: Diagnosis not present

## 2015-09-02 LAB — CBC
HCT: 38 % (ref 36.0–46.0)
Hemoglobin: 13.4 g/dL (ref 12.0–15.0)
MCH: 30.8 pg (ref 26.0–34.0)
MCHC: 35.3 g/dL (ref 30.0–36.0)
MCV: 87.4 fL (ref 78.0–100.0)
PLATELETS: 158 10*3/uL (ref 150–400)
RBC: 4.35 MIL/uL (ref 3.87–5.11)
RDW: 13.5 % (ref 11.5–15.5)
WBC: 10 10*3/uL (ref 4.0–10.5)

## 2015-09-02 LAB — MAGNESIUM: MAGNESIUM: 1.4 mg/dL — AB (ref 1.7–2.4)

## 2015-09-02 MED ORDER — FUROSEMIDE 20 MG PO TABS: 20.0000 mg | ORAL_TABLET | Freq: Every day | ORAL | Status: DC | PRN

## 2015-09-02 MED ORDER — APIXABAN 5 MG PO TABS
10.0000 mg | ORAL_TABLET | Freq: Two times a day (BID) | ORAL | Status: DC
Start: 2015-09-02 — End: 2015-11-11

## 2015-09-02 MED ORDER — APIXABAN 5 MG PO TABS: 5.0000 mg | ORAL_TABLET | Freq: Two times a day (BID) | ORAL | Status: DC

## 2015-09-02 NOTE — Discharge Summary (Signed)
Angela [name] Ward [name], is a 82 y.o. female  DOB 1932-03-31  MRN [id number].  Admission date:  08/30/2015  Admitting Physician  [name], MD  Discharge Date:  09/02/2015   Primary MD  [name], MD  Recommendations for primary care physician for things to follow:   Check CBC, BMP in 5-7 days.  Outpatient cardiothoracic surgery follow-up for incidental finding of aortic aneurysm.  Monitor weight and edema.   Admission Diagnosis  SOB (shortness of breath) [R06.02] Thoracic ascending aortic aneurysm (HCC) [I71.2] Other acute pulmonary embolism without acute cor pulmonale (HCC) [I26.99]   Discharge Diagnosis  SOB (shortness of breath) [R06.02] Thoracic ascending aortic aneurysm (HCC) [I71.2] Other acute pulmonary embolism without acute cor pulmonale (HCC) [I26.99]    Principal Problem:   Pulmonary emboli (HCC) Active Problems:   GERD (gastroesophageal reflux disease)   Hypothyroidism (acquired)   Acute massive pulmonary embolism (HCC)   Pulmonary embolism without acute cor pulmonale (HCC)      Past Medical History  Diagnosis Date  . Hypertension   . Dyslipidemia   . Diabetes mellitus   . Arthritis   . GERD (gastroesophageal reflux disease)   . Heart murmur   . Breast cancer ([place]) 1994    left sided cancer; unilateral mastectomy, no chemo or radiation  . BRCA2 positive 10/214    Past Surgical History  Procedure Laterality Date  . Cardiac catheterization      NORMAL CORONARY ARTERIES  . Mastectomy      LEFT BREAST  . Cholecystectomy    . Tonsillectomy         HPI  from the history and physical done on the day of admission:   82 year old Caucasian with history of Hypertension, DM, Hyperlipidemia, left breast cancer, GERD and arthritis. Patient presents with history of bilateral leg  swelling, right worse than left and SOB. Patient developed SOB after [place]. CTA of the chest done revealed bilateral acute PE with CT evidence of right heart strain. BP has remained stable. Doppler ultrasound of the lower extremities is still pending. On further questioning, patient tells me that she had DVT about 50 years ago but could not remember the details. History of breast cancer is noted as documented above. No headache, no neck pain, no chest pain, no palpitation, no GI symptoms and no urinary symptoms.  ED Course: Patient was started on Heparin drip.  Pertinent labs: CTA chest reveals bilateral PE with CT evidence of right heart strain and 4cm dilatation of the ascending aorta.    Hospital Course:     1. Acute Bilateral PE - Submassive by CT criteria, EKG nonacute, troponin negative, Minimal shortness of breath. Switched from Heparin to Eliquis, lower extremity venous duplex unremarkable, stable echogram. Increase activity. She is already oxygen free. Requested case management to provide one-month Eliquis coupon thereafter per PCP. Outpatient follow-up with her primary oncologist Dr. Benay Spice.  2. Incidental finding of ascending Aortic aneurysm, with evidence of atherosclerosis. Outpatient follow-up with  PCP. Secondary risk factor modulation, repeat CT angiogram within one year. Consider one-time outpatient cardiothoracic surgery follow-up. Will defer attachment of this issue to PCP.  3. Essential Hypertension. Continue home medications which include Imdur, Norvasc and HCTZ.   4. GERD. On PPI   5. Hypothyroidism. Continue home dose Synthroid.  6. Mild acute on chronic diastolic CHF. Echo stable, EF 65%, mention of LVH, resolved after single dose of Lasix, placed on fluid and salt restriction along with as needed Lasix orally.  7. History of breast cancer. Follow with Dr. Blair Promise post discharge.       Follow UP  Follow-up Information     Follow up with [facility], MD. Schedule an appointment as soon as possible for a visit in 1 week.   Specialty:  Endocrinology   Contact information:   [address] [place] [address] [phone]       Follow up with Melrose Nakayama, MD. Schedule an appointment as soon as possible for a visit in 1 month.   Specialty:  Cardiothoracic Surgery   Why:  Aortic Aneurysm    Contact information:   [address] [place] [address] [phone]       Follow up with Betsy Coder, MD. Schedule an appointment as soon as possible for a visit in 1 week.   Specialty:  Oncology   Contact information:   [place] [address] [phone]        Consults obtained - None  Discharge Condition: Stable  Diet and Activity recommendation: See Discharge Instructions below  Discharge Instructions           Discharge Instructions    Diet - low sodium heart healthy    Complete by:  As directed      Discharge instructions    Complete by:  As directed   Follow with Primary MD [name], MD in 7 days   Get CBC, CMP, 2 view Chest X ray checked  by Primary MD next visit.    Activity: As tolerated with Full fall precautions use walker/cane & assistance as needed   Disposition Home    Diet:   Heart Healthy Low Carb.  Check your Weight same time everyday, if you gain over 2 pounds, or you develop in leg swelling, experience more shortness of breath or chest pain, call your Primary MD immediately. Follow Cardiac Low Salt Diet and 1.5 lit/day fluid restriction.   On your next visit with your primary care physician please Get Medicines reviewed and adjusted.   Please request your Prim.MD to go over all Hospital Tests and Procedure/Radiological results at the follow up, please get all Hospital records sent to your Prim MD by signing hospital release before you go home.   If you experience worsening of your admission symptoms, develop  shortness of breath, life threatening emergency, suicidal or homicidal thoughts you must seek medical attention immediately by calling 911 or calling your MD immediately  if symptoms less severe.  You Must read complete instructions/literature along with all the possible adverse reactions/side effects for all the Medicines you take and that have been prescribed to you. Take any new Medicines after you have completely understood and accpet all the possible adverse reactions/side effects.   Do not drive, operate heavy machinery, perform activities at heights, swimming or participation in water activities or provide baby sitting services if your were admitted for syncope or siezures until you have seen by Primary MD or a  Neurologist and advised to do so again.  Do not drive when taking Pain medications.    Do not take more than prescribed Pain, Sleep and Anxiety Medications  Special Instructions: If you have smoked or chewed Tobacco  in the last 2 yrs please stop smoking, stop any regular Alcohol  and or any Recreational drug use.  Wear Seat belts while driving.   Please note  You were cared for by a hospitalist during your hospital stay. If you have any questions about your discharge medications or the care you received while you were in the hospital after you are discharged, you can call the unit and asked to speak with the hospitalist on call if the hospitalist that took care of you is not available. Once you are discharged, your primary care physician will handle any further medical issues. Please note that NO REFILLS for any discharge medications will be authorized once you are discharged, as it is imperative that you return to your primary care physician (or establish a relationship with a primary care physician if you do not have one) for your aftercare needs so that they can reassess your need for medications and monitor your lab values.     Increase activity slowly    Complete by:  As  directed              Discharge Medications       Medication List    STOP taking these medications        ciprofloxacin 500 MG tablet  Commonly known as:  CIPRO      TAKE these medications        apixaban 5 MG Tabs tablet  Commonly known as:  ELIQUIS  Take 2 tablets (10 mg total) by mouth 2 (two) times daily.     apixaban 5 MG Tabs tablet  Commonly known as:  ELIQUIS  Take 1 tablet (5 mg total) by mouth 2 (two) times daily.  Start taking on:  09/07/2015     benazepril-hydrochlorthiazide 20-12.5 MG tablet  Commonly known as:  LOTENSIN HCT  TAKE 1 TABLET ONCE DAILY.     furosemide 20 MG tablet  Commonly known as:  LASIX  Take 1 tablet (20 mg total) by mouth daily as needed for edema (if weight high by 2 pounds in 24hrs, weigh yourself daily).     isosorbide mononitrate 30 MG 24 hr tablet  Commonly known as:  IMDUR  TAKE 1 TABLET ONCE DAILY.     pantoprazole 40 MG tablet  Commonly known as:  PROTONIX  TAKE 1 TABLET ONCE DAILY.     PERCOCET 10-325 MG tablet  Generic drug:  oxyCODONE-acetaminophen  Take 0.25-1 tablets by mouth every 6 (six) hours as needed for pain.     polyethylene glycol packet  Commonly known as:  MIRALAX / GLYCOLAX  Take 17 g by mouth 3 (three) times daily as needed.     potassium chloride 10 MEQ tablet  Commonly known as:  K-DUR  Take 2 tablets daily     SYNTHROID 100 MCG tablet  Generic drug:  levothyroxine  TAKE 1 TABLET ONCE A DAY SIX DAYS A WEEK.     Vitamin D (Ergocalciferol) 50000 units Caps capsule  Commonly known as:  DRISDOL  TAKE ONE CAPSULE ONCE A WEEK.     VOLTAREN 1 % Gel  Generic drug:  diclofenac sodium  Apply 2 g topically 4 (four) times daily as needed (pain). To knees  Major procedures and Radiology Reports - PLEASE review detailed and final reports for all details, in brief -   Echogram. Left ventricle: The cavity size was normal. Wall thickness was increased in a pattern of mild LVH. Systolic function  was normal. The estimated ejection fraction was in the range of 60% to 65%. Wall motion was normal; there were no regional wall motion abnormalities. Doppler parameters are consistent with both elevated ventricular end-diastolic filling pressure and elevated  left atrial filling pressure. Impressions: Overall image quality very poor.  Impressions:  Overall image quality very poor.   Lower extremity venous duplex. No DVT SVT.   CT angiogram chest. Positive for PE and AAA along with CAD.   Dg Chest 2 View  08/30/2015  CLINICAL DATA:  Chest pain, shortness of breath, leg swelling EXAM: CHEST  2 VIEW COMPARISON:  05/26/2012 FINDINGS: Cardiomediastinal silhouette is stable. Mild elevation of the right hemidiaphragm again noted. No acute infiltrate or pulmonary edema. There is prior vertebroplasty at T12 level. Stable mild compression deformity of T9 vertebral body. IMPRESSION: No active cardiopulmonary disease. Electronically Signed   By: Lahoma Crocker M.D.   On: 08/30/2015 11:59   Ct Angio Chest Pe W/cm &/or Wo Cm  08/30/2015  EXAM: CT ANGIOGRAPHY CHEST WITH CONTRAST TECHNIQUE: Multidetector CT imaging of the chest was performed using the standard protocol during bolus administration of intravenous contrast. Multiplanar CT image reconstructions and MIPs were obtained to evaluate the vascular anatomy. CONTRAST:  100 mL Isovue 370 IV COMPARISON:  None. FINDINGS: Vascular: Right arm IV contrast administration. SVC patent. Right atrium nondilated. RV/LV ratio 1.3, elevated. There are central, segmental and subsegmental emboli in pulmonary artery branches to right upper, middle, lower, and left lower lobes. Patent bilateral pulmonary veins drain into the left atrium. Scattered coronary calcifications. There is good contrast opacification of the thoracic aorta. Mild dilatation of the ascending segment up to 4 cm transverse diameter proximally, returning to normal caliber distally through the arch and descending  segment. Scattered arch and descending thoracic aortic calcifications. No dissection or stenosis. Classic 3 vessel brachiocephalic arterial origin anatomy without proximal stenosis. Mediastinum/Lymph Nodes: No masses or pathologically enlarged lymph nodes identified. No pericardial effusion. Lungs/Pleura: No pulmonary mass, infiltrate, or effusion. Upper abdomen: No acute findings. 19 mm subpleural fluid attenuation lesion in hepatic segment 7 possibly cyst. Musculoskeletal: Remote T12 compression fracture post kyphoplasty/ vertebroplasty. Stable mild T9 compression deformity. Mild spondylitic change in the visualized lower cervical spine. Review of the MIP images confirms the above findings. IMPRESSION: 1. Bilateral acute PE with CT evidence of right heart strain (RV/LV Ratio = 1.3) consistent with at least submassive (intermediate risk) PE. The presence of right heart strain has been associated with an increased risk of morbidity and mortality. Please activate Code PE by paging [phone]. Critical Value/emergent results were called by telephone at the time of interpretation on 08/30/2015 at 12:54 pm to Dr. Francine Graven , who verbally acknowledged these results. 2. Atherosclerosis, including aortic and coronary artery disease. Please note that although the presence of coronary artery calcium documents the presence of coronary artery disease, the severity of this disease and any potential stenosis cannot be assessed on this non-gated CT examination. Assessment for potential risk factor modification, dietary therapy or pharmacologic therapy may be warranted, if clinically indicated. 3. 4 cm ascending thoracic aortic aneurysm without complicating features. Recommend annual imaging followup by CTA or MRA. This recommendation follows 2010 ACCF/AHA/AATS/ACR/ASA/SCA/SCAI/SIR/STS/SVM Guidelines for the Diagnosis and Management of Patients with Thoracic Aortic  Disease. Circulation. 2010; 121: B396-D289 4. Old T9 and  T12 compression deformities. Electronically Signed   By: Lucrezia Europe M.D.   On: 08/30/2015 12:56    Micro Results      Recent Results (from the past 240 hour(s))  MRSA PCR Screening     Status: None   Collection Time: 08/30/15  4:00 PM  Result Value Ref Range Status   MRSA by PCR NEGATIVE NEGATIVE Final    Comment:        The GeneXpert MRSA Assay (FDA approved for NASAL specimens only), is one component of a comprehensive MRSA colonization surveillance program. It is not intended to diagnose MRSA infection nor to guide or monitor treatment for MRSA infections.        Today   Subjective    [facility] today has no headache,no chest abdominal pain,no new weakness tingling or numbness, feels much better wants to go home today.    Objective   Blood pressure 174/47, pulse 79, temperature 98.1 F (36.7 C), temperature source Oral, resp. rate 20, height _0  (1.727 m), weight 102.83 kg (226 lb 11.2 oz), SpO2 95 %.   Intake/Output Summary (Last 24 hours) at 09/02/15 1207 Last data filed at 09/02/15 0900  Gross per 24 hour  Intake    240 ml  Output   1125 ml  Net   -885 ml    Exam Awake Alert, Oriented x 3, No new F.N deficits, Normal affect [place].AT,PERRAL Supple Neck,No JVD, No cervical lymphadenopathy appriciated.  Symmetrical Chest wall movement, Good air movement bilaterally, CTAB RRR,No Gallops,Rubs or new Murmurs, No Parasternal Heave +ve B.Sounds, Abd Soft, Non tender, No organomegaly appriciated, No rebound -guarding or rigidity. No Cyanosis, Clubbing or edema, No new Rash or bruise   Data Review   CBC w Diff:  Lab Results  Component Value Date   WBC 10.0 09/02/2015   WBC 13.5* 12/24/2014   HGB 13.4 09/02/2015   HGB 15.0 12/24/2014   HCT 38.0 09/02/2015   HCT 44.4 12/24/2014   PLT 158 09/02/2015   PLT 275 12/24/2014   LYMPHOPCT 24.2 07/29/2015   LYMPHOPCT 17.6 12/24/2014   MONOPCT 7.8 07/29/2015   MONOPCT 7.7 12/24/2014   EOSPCT 1.2  07/29/2015   EOSPCT 0.4 12/24/2014   BASOPCT 0.3 07/29/2015   BASOPCT 0.8 12/24/2014    CMP:  Lab Results  Component Value Date   NA 140 09/01/2015   K 3.8 09/01/2015   CL 104 09/01/2015   CO2 28 09/01/2015   BUN 8 09/01/2015   CREATININE 0.45 09/01/2015   PROT 6.6 07/29/2015   ALBUMIN 3.9 07/29/2015   BILITOT 1.1 07/29/2015   ALKPHOS 92 07/29/2015   AST 13 07/29/2015   ALT 14 07/29/2015  .   Total Time in preparing paper work, data evaluation and todays exam - 35 minutes  Thurnell Lose M.D on 09/02/2015 at 12:07 PM  [facility]   Office  [phone]

## 2015-09-02 NOTE — Progress Notes (Signed)
Discussed all discharge instructions with patient. Sent home with copy of discharge instructions.

## 2015-09-02 NOTE — Care Management Note (Signed)
Case Management Note  Patient Details  Name: Angela [name] MRN: [id number] Date of Birth: 1932-05-16  Subjective/Objective: Provided nurse w/Eliquis 30 day free trial coupon.                   Action/Plan:d/c plan home.No further d/c needs.   Expected Discharge Date:               Expected Discharge Plan:  Home/Self Care  In-House Referral:     Discharge planning Services  CM Consult, Medication Assistance  Post Acute Care Choice:    Choice offered to:     DME Arranged:    DME Agency:     HH Arranged:    [place] Agency:     Status of Service:  Completed, signed off  Medicare Important Message Given:  Yes Date Medicare IM Given:    Medicare IM give by:    Date Additional Medicare IM Given:    Additional Medicare Important Message give by:     If discussed at [place] of Stay Meetings, dates discussed:    Additional Comments:  Dessa Phi, RN 09/02/2015, 10:51 AM

## 2015-09-02 NOTE — Discharge Instructions (Signed)
Follow with Primary MD [name], MD in 7 days   Get CBC, CMP, 2 view Chest X ray checked  by Primary MD next visit.    Activity: As tolerated with Full fall precautions use walker/cane & assistance as needed   Disposition Home    Diet:   Heart Healthy Low Carb. Check your Weight same time everyday, if you gain over 2 pounds, or you develop in leg swelling, experience more shortness of breath or chest pain, call your Primary MD immediately. Follow Cardiac Low Salt Diet and 1.5 lit/day fluid restriction.   On your next visit with your primary care physician please Get Medicines reviewed and adjusted.   Please request your Prim.MD to go over all Hospital Tests and Procedure/Radiological results at the follow up, please get all Hospital records sent to your Prim MD by signing hospital release before you go home.   If you experience worsening of your admission symptoms, develop shortness of breath, life threatening emergency, suicidal or homicidal thoughts you must seek medical attention immediately by calling 911 or calling your MD immediately  if symptoms less severe.  You Must read complete instructions/literature along with all the possible adverse reactions/side effects for all the Medicines you take and that have been prescribed to you. Take any new Medicines after you have completely understood and accpet all the possible adverse reactions/side effects.   Do not drive, operate heavy machinery, perform activities at heights, swimming or participation in water activities or provide baby sitting services if your were admitted for syncope or siezures until you have seen by Primary MD or a Neurologist and advised to do so again.  Do not drive when taking Pain medications.    Do not take more than prescribed Pain, Sleep and Anxiety Medications  Special Instructions: If you have smoked or chewed Tobacco  in the last 2 yrs please stop smoking, stop any regular Alcohol  and or any  Recreational drug use.  Wear Seat belts while driving.   Please note  You were cared for by a hospitalist during your hospital stay. If you have any questions about your discharge medications or the care you received while you were in the hospital after you are discharged, you can call the unit and asked to speak with the hospitalist on call if the hospitalist that took care of you is not available. Once you are discharged, your primary care physician will handle any further medical issues. Please note that NO REFILLS for any discharge medications will be authorized once you are discharged, as it is imperative that you return to your primary care physician (or establish a relationship with a primary care physician if you do not have one) for your aftercare needs so that they can reassess your need for medications and monitor your lab values.     Information on my medicine - ELIQUIS (apixaban)  This medication education was reviewed with me or my healthcare representative as part of my discharge preparation.  The pharmacist that spoke with me during my hospital stay was:  Lolita Patella, [facility]  Why was Eliquis prescribed for you? Eliquis was prescribed to treat blood clots that may have been found in the veins of your legs (deep vein thrombosis) or in your lungs (pulmonary embolism) and to reduce the risk of them occurring again.  What do You need to know about Eliquis ? The starting dose is 10 mg (two 5 mg tablets) taken TWICE daily for the FIRST SEVEN (7) DAYS, then  on 09/07/15  the dose is reduced to ONE 5 mg tablet taken TWICE daily.  Eliquis may be taken with or without food.   Try to take the dose about the same time in the morning and in the evening. If you have difficulty swallowing the tablet whole please discuss with your pharmacist how to take the medication safely.  Take Eliquis exactly as prescribed and DO NOT stop taking Eliquis without talking to the doctor who  prescribed the medication.  Stopping may increase your risk of developing a new blood clot.  Refill your prescription before you run out.  After discharge, you should have regular check-up appointments with your healthcare provider that is prescribing your Eliquis.    What do you do if you miss a dose? If a dose of ELIQUIS is not taken at the scheduled time, take it as soon as possible on the same day and twice-daily administration should be resumed. The dose should not be doubled to make up for a missed dose.  Important Safety Information A possible side effect of Eliquis is bleeding. You should call your healthcare provider right away if you experience any of the following: ? Bleeding from an injury or your nose that does not stop. ? Unusual colored urine (red or dark brown) or unusual colored stools (red or black). ? Unusual bruising for unknown reasons. ? A serious fall or if you hit your head (even if there is no bleeding).  Some medicines may interact with Eliquis and might increase your risk of bleeding or clotting while on Eliquis. To help avoid this, consult your healthcare provider or pharmacist prior to using any new prescription or non-prescription medications, including herbals, vitamins, non-steroidal anti-inflammatory drugs (NSAIDs) and supplements.  This website has more information on Eliquis (apixaban): http://www.eliquis.com/eliquis/home

## 2015-09-02 NOTE — Care Management Important Message (Signed)
Important Message  Patient Details  Name: Angela [name] MRN: [id number] Date of Birth: [birth year]   Medicare Important Message Given:  Yes    Camillo Flaming 09/02/2015, 9:13 AMImportant Message  Patient Details  Name: Angela [name] MRN: [id number] Date of Birth: 05-07-1932   Medicare Important Message Given:  Yes    Camillo Flaming 09/02/2015, 9:13 AM

## 2015-09-03 ENCOUNTER — Telehealth: Payer: Self-pay | Admitting: *Deleted

## 2015-09-03 NOTE — Telephone Encounter (Signed)
Message from pt reporting she was diagnosed with PE in ED. Started on Eliquis. Was told to follow up with oncologist within one week. Reviewed with Dr. Benay Spice: Have pt follow up with PCP, will see pt if needed. Pt has scheduled PCP visit for 6/20. She will have Dr. Dwyane Dee call Dr. Benay Spice if she needs to be seen sooner than October visit.

## 2015-09-09 ENCOUNTER — Other Ambulatory Visit: Payer: Self-pay

## 2015-09-09 ENCOUNTER — Ambulatory Visit
Admission: RE | Admit: 2015-09-09 | Discharge: 2015-09-09 | Disposition: A | Discharge status: Home / Self Care | Payer: Medicare Other | Source: Ambulatory Visit | Attending: Endocrinology | Admitting: Endocrinology

## 2015-09-09 ENCOUNTER — Encounter: Payer: Self-pay | Admitting: Endocrinology

## 2015-09-09 ENCOUNTER — Ambulatory Visit: Payer: Medicare Other

## 2015-09-09 ENCOUNTER — Ambulatory Visit (INDEPENDENT_AMBULATORY_CARE_PROVIDER_SITE_OTHER): Payer: Medicare Other | Admitting: Endocrinology

## 2015-09-09 ENCOUNTER — Telehealth: Payer: Self-pay | Admitting: Endocrinology

## 2015-09-09 VITALS — BP 128/94 | HR 82

## 2015-09-09 DIAGNOSIS — I2699 Other pulmonary embolism without acute cor pulmonale: Secondary | ICD-10-CM | POA: Diagnosis not present

## 2015-09-09 DIAGNOSIS — D72829 Elevated white blood cell count, unspecified: Secondary | ICD-10-CM

## 2015-09-09 DIAGNOSIS — E039 Hypothyroidism, unspecified: Secondary | ICD-10-CM

## 2015-09-09 DIAGNOSIS — I10 Essential (primary) hypertension: Secondary | ICD-10-CM

## 2015-09-09 DIAGNOSIS — E785 Hyperlipidemia, unspecified: Secondary | ICD-10-CM

## 2015-09-09 DIAGNOSIS — R300 Dysuria: Secondary | ICD-10-CM

## 2015-09-09 DIAGNOSIS — R7303 Prediabetes: Secondary | ICD-10-CM

## 2015-09-09 LAB — COMPREHENSIVE METABOLIC PANEL
ALT: 15 U/L (ref 0–35)
AST: 12 U/L (ref 0–37)
Albumin: 3.8 g/dL (ref 3.5–5.2)
Alkaline Phosphatase: 93 U/L (ref 39–117)
BUN: 17 mg/dL (ref 6–23)
CHLORIDE: 100 meq/L (ref 96–112)
CO2: 28 meq/L (ref 19–32)
CREATININE: 0.57 mg/dL (ref 0.40–1.20)
Calcium: 9.2 mg/dL (ref 8.4–10.5)
GFR: 108.13 mL/min (ref >60.00)
Glucose, Bld: 123 mg/dL — ABNORMAL HIGH (ref 70–99)
POTASSIUM: 3.7 meq/L (ref 3.5–5.1)
SODIUM: 136 meq/L (ref 135–145)
Total Bilirubin: 0.6 mg/dL (ref 0.2–1.2)
Total Protein: 6.7 g/dL (ref 6.0–8.3)

## 2015-09-09 LAB — URINALYSIS, ROUTINE W REFLEX MICROSCOPIC
HGB URINE DIPSTICK: NEGATIVE
Ketones, ur: NEGATIVE
LEUKOCYTES UA: NEGATIVE
NITRITE: NEGATIVE
RBC / HPF: NONE SEEN (ref 0–?)
Specific Gravity, Urine: 1.015 (ref 1.000–1.030)
Total Protein, Urine: NEGATIVE
Urine Glucose: NEGATIVE
Urobilinogen, UA: 0.2 (ref 0.0–1.0)
pH: 6.5 (ref 5.0–8.0)

## 2015-09-09 LAB — CBC WITH DIFFERENTIAL/PLATELET
BASOS ABS: 0 10*3/uL (ref 0.0–0.1)
Basophils Relative: 0.1 % (ref 0.0–3.0)
Eosinophils Absolute: 0.1 10*3/uL (ref 0.0–0.7)
Eosinophils Relative: 0.5 % (ref 0.0–5.0)
HEMATOCRIT: 45.4 % (ref 36.0–46.0)
HEMOGLOBIN: 15.3 g/dL — AB (ref 12.0–15.0)
LYMPHS PCT: 15.3 % (ref 12.0–46.0)
Lymphs Abs: 2.5 10*3/uL (ref 0.7–4.0)
MCHC: 33.7 g/dL (ref 30.0–36.0)
MCV: 90.1 fl (ref 78.0–100.0)
MONOS PCT: 7.5 % (ref 3.0–12.0)
Monocytes Absolute: 1.2 10*3/uL — ABNORMAL HIGH (ref 0.1–1.0)
NEUTROS ABS: 12.3 10*3/uL — AB (ref 1.4–7.7)
Neutrophils Relative %: 76.6 % (ref 43.0–77.0)
Platelets: 362 10*3/uL (ref 150.0–400.0)
RBC: 5.04 Mil/uL (ref 3.87–5.11)
RDW: 13.6 % (ref 11.5–15.5)
WBC: 16.1 10*3/uL — ABNORMAL HIGH (ref 4.0–10.5)

## 2015-09-09 LAB — TSH: TSH: 1.63 u[IU]/mL (ref 0.35–4.50)

## 2015-09-09 MED ORDER — APIXABAN 5 MG PO TABS: 5.0000 mg | ORAL_TABLET | Freq: Two times a day (BID) | ORAL | Status: DC

## 2015-09-09 NOTE — Telephone Encounter (Signed)
Noted. PA for Eliquis has been submitted to the pt's insurance. Once medication approval is given medication will be sent.

## 2015-09-09 NOTE — Telephone Encounter (Signed)
PT called to talk to [facility].  She said please remind Dr. Dwyane Dee to send in the drug Eliquis to her pharmacy

## 2015-09-09 NOTE — Progress Notes (Signed)
Quick Note:  Please let patient know that all the lab results including chest x-ray are normal, has high blood cells as before Continue same medications ______

## 2015-09-09 NOTE — Progress Notes (Signed)
Subjective:     Patient ID: Angela [name], female   DOB: 27-May-1932, 82 y.o.   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 and was having leg swelling but she did not report this and subsequently started having shortness of breath and went to the emergency room She had transient CHF at the admission time probably related to right heart failure and was only given Lasix initially  Currently she is not complaining of shortness of breath or leg edema She is taking Eliquis twice a day now,  was initially given 4 tablets a day and she is having difficulty getting the prescription refilled She is expressing concern and fear about her recent events but was also told by the hospitalist that she did delay her visit to the emergency room  Hypertension:  She has had long-standing hypertension which has been well controlled usually.  Currently on Lotensin HCT and also on potassium supplements  Medications were not changed at discharge  OTHER active problems are detailed in review of systems:     Medication List       This list is accurate as of: 09/09/15  9:54 AM.  Always use your most recent med list.               apixaban 5 MG Tabs tablet  Commonly known as:  ELIQUIS  Take 2 tablets (10 mg total) by mouth 2 (two) times daily.     apixaban 5 MG Tabs tablet  Commonly known as:  ELIQUIS  Take 1 tablet (5 mg total) by mouth 2 (two) times daily.     benazepril-hydrochlorthiazide 20-12.5 MG tablet  Commonly known as:  LOTENSIN HCT  TAKE 1 TABLET ONCE DAILY.     furosemide 20 MG tablet  Commonly known as:  LASIX  Take 1 tablet (20 mg total) by mouth daily as needed for edema (if weight high by 2 pounds in 24hrs,  weigh yourself daily).     isosorbide mononitrate 30 MG 24 hr tablet  Commonly known as:  IMDUR  TAKE 1 TABLET ONCE DAILY.     pantoprazole 40 MG tablet  Commonly known as:  PROTONIX  TAKE 1 TABLET ONCE DAILY.     PERCOCET 10-325 MG tablet  Generic drug:  oxyCODONE-acetaminophen  Take 0.25-1 tablets by mouth every 6 (six) hours as needed for pain.     polyethylene glycol packet  Commonly known as:  MIRALAX / GLYCOLAX  Take 17 g by mouth 3 (three) times daily as needed.     potassium chloride 10 MEQ tablet  Commonly known as:  K-DUR  Take 2 tablets daily     SYNTHROID 100 MCG tablet  Generic drug:  levothyroxine  TAKE 1 TABLET ONCE A DAY SIX DAYS A WEEK.     Vitamin D (Ergocalciferol) 50000 units Caps capsule  Commonly known as:  DRISDOL  TAKE ONE CAPSULE ONCE A WEEK.     VOLTAREN 1 % Gel  Generic drug:  diclofenac sodium  Apply 2 g topically 4 (four) times daily as needed (pain). To knees        Review of Systems  Respiratory: Negative for shortness of breath.   Cardiovascular: Negative for chest pain, palpitations and leg swelling.  Genitourinary: Negative for dysuria.   Primary hypothyroidism: She has had long-standing primary hypothyroidism.  She has been on consistent dosage.  Since her visit in 5/17 she has been taking levothyroxine 100 g, 7 days/week  previously was having some fatigue and the dose was increased when TSH was 7.2    She is not always compliant with taking her medication in the morning, also will take it at different times with asking about whether she can take it with coffee   Lab Results  Component Value Date   TSH 7.22* 07/29/2015   TSH 1.78 03/26/2015   TSH 1.19 08/29/2014   FREET4 1.10 07/29/2015   FREET4 1.11 08/29/2014   FREET4 1.23 04/30/2014   She had UTI a couple of weeks ago, no recent recurrence, treated with Cipro      Objective:   Physical Exam  Constitutional: No distress.  Cardiovascular: Normal rate, regular  rhythm and normal heart sounds.  Exam reveals no gallop.   No murmur heard. Pulmonary/Chest: Breath sounds normal. No respiratory distress. She has no wheezes. She has no rales.  Abdominal: She exhibits no distension and no mass.  Musculoskeletal: She exhibits no edema.  Skin: Skin is warm and dry.  Psychiatric:  Appears anxious    BP 128/94 mmHg  Pulse 82  Wt   SpO2 96%     Assessment:     History of pulmonary embolism probably secondary to the vein thrombosis related to prolonged sitting and resulting edema Currently started on anticoagulation and clinically doing well, no desaturation today No evidence of CHF on exam  HYPERTENSION: Fairly well controlled, she was very anxious today initially  HYPOTHYROIDISM: Her dose was increased by 100 g weekly last month and will need follow-up TSH  History of hyperlipidemia: Patient usually noncompliant with taking her medications because of fear of side effects  History of leukocytosis partly related to steroid injections.  Will continue to follow  History of prediabetes: No recent hyperglycemia noted     Plan:     Recheck renal function, blood counts and chest x-ray today Likely continue Eliquis for at least 6 months, will also discuss with hematologist who is going to see her in October Prior authorization needs to be done will be completed today  Continue current medication regimen Check thyroid levels today  She does not need to take any Lasix as she does not have any documented CHF  Total visit time reviewing hospital records, History, exam, labs, treatment planning and counseling = 25 minute     [name] 09/09/2015

## 2015-09-10 ENCOUNTER — Telehealth: Payer: Self-pay

## 2015-09-10 ENCOUNTER — Telehealth: Payer: Self-pay | Admitting: Endocrinology

## 2015-09-10 NOTE — Telephone Encounter (Signed)
[phone] [employer] eliquis questions

## 2015-09-10 NOTE — Telephone Encounter (Signed)
Called patient, gave results of labs and of xray results. Patient would like copies of lab results mailed to her. Patient asked about chest xray, said when she was in the hospital she was told she had blood clots in her lungs; and now the xray is showing normal. Patient asked to be called back, when we know if blood clots are still an issue.

## 2015-09-11 ENCOUNTER — Other Ambulatory Visit: Payer: Self-pay

## 2015-09-11 MED ORDER — LEVOTHYROXINE SODIUM 100 MCG PO TABS: 100.0000 ug | ORAL_TABLET | Freq: Every day | ORAL | Status: DC

## 2015-09-12 NOTE — Telephone Encounter (Signed)
Pa submitted and approved per pt's insurance on 09/10/2015. Pt notified on 09/10/2015.

## 2015-09-16 ENCOUNTER — Ambulatory Visit: Payer: Medicare Other | Admitting: Endocrinology

## 2015-09-16 DIAGNOSIS — L820 Inflamed seborrheic keratosis: Secondary | ICD-10-CM | POA: Diagnosis not present

## 2015-09-16 DIAGNOSIS — L821 Other seborrheic keratosis: Secondary | ICD-10-CM | POA: Diagnosis not present

## 2015-09-16 DIAGNOSIS — L718 Other rosacea: Secondary | ICD-10-CM | POA: Diagnosis not present

## 2015-09-25 ENCOUNTER — Other Ambulatory Visit: Payer: Self-pay | Admitting: Endocrinology

## 2015-09-26 ENCOUNTER — Telehealth: Payer: Self-pay

## 2015-09-26 NOTE — Telephone Encounter (Signed)
Called and spoke to patient about normal lab and chest xray. Patient had no questions, states she will be here soon for an appointment. Asked for lab results to be sent to her, which have already been sent out.

## 2015-09-30 DIAGNOSIS — M170 Bilateral primary osteoarthritis of knee: Secondary | ICD-10-CM | POA: Diagnosis not present

## 2015-10-23 ENCOUNTER — Other Ambulatory Visit: Payer: Self-pay | Admitting: Endocrinology

## 2015-10-28 ENCOUNTER — Other Ambulatory Visit: Payer: Self-pay | Admitting: Endocrinology

## 2015-10-28 DIAGNOSIS — M170 Bilateral primary osteoarthritis of knee: Secondary | ICD-10-CM | POA: Diagnosis not present

## 2015-10-28 DIAGNOSIS — M47816 Spondylosis without myelopathy or radiculopathy, lumbar region: Secondary | ICD-10-CM | POA: Diagnosis not present

## 2015-11-04 ENCOUNTER — Other Ambulatory Visit (INDEPENDENT_AMBULATORY_CARE_PROVIDER_SITE_OTHER): Payer: Medicare Other

## 2015-11-04 DIAGNOSIS — D72829 Elevated white blood cell count, unspecified: Secondary | ICD-10-CM | POA: Diagnosis not present

## 2015-11-04 DIAGNOSIS — R7303 Prediabetes: Secondary | ICD-10-CM

## 2015-11-04 DIAGNOSIS — E785 Hyperlipidemia, unspecified: Secondary | ICD-10-CM | POA: Diagnosis not present

## 2015-11-04 DIAGNOSIS — I10 Essential (primary) hypertension: Secondary | ICD-10-CM | POA: Diagnosis not present

## 2015-11-04 LAB — BASIC METABOLIC PANEL
BUN: 14 mg/dL (ref 6–23)
CO2: 29 mEq/L (ref 19–32)
Calcium: 9.2 mg/dL (ref 8.4–10.5)
Chloride: 100 mEq/L (ref 96–112)
Creatinine, Ser: 0.6 mg/dL (ref 0.40–1.20)
GFR: 101.87 mL/min (ref >60.00)
Glucose, Bld: 127 mg/dL — ABNORMAL HIGH (ref 70–99)
POTASSIUM: 4.1 meq/L (ref 3.5–5.1)
SODIUM: 138 meq/L (ref 135–145)

## 2015-11-04 LAB — LIPID PANEL
CHOLESTEROL: 248 mg/dL — AB (ref 0–200)
HDL: 54.5 mg/dL (ref >39.00)
LDL Cholesterol: 174 mg/dL — ABNORMAL HIGH (ref 0–99)
NonHDL: 193.99
TRIGLYCERIDES: 101 mg/dL (ref 0.0–149.0)
Total CHOL/HDL Ratio: 5
VLDL: 20.2 mg/dL (ref 0.0–40.0)

## 2015-11-04 LAB — HEMOGLOBIN A1C: HEMOGLOBIN A1C: 5.9 % (ref 4.6–6.5)

## 2015-11-04 LAB — CBC
HEMATOCRIT: 44 % (ref 36.0–46.0)
HEMOGLOBIN: 15 g/dL (ref 12.0–15.0)
MCHC: 34 g/dL (ref 30.0–36.0)
MCV: 89.2 fl (ref 78.0–100.0)
PLATELETS: 299 10*3/uL (ref 150.0–400.0)
RBC: 4.94 Mil/uL (ref 3.87–5.11)
RDW: 13.4 % (ref 11.5–15.5)
WBC: 14.3 10*3/uL — ABNORMAL HIGH (ref 4.0–10.5)

## 2015-11-06 ENCOUNTER — Other Ambulatory Visit: Payer: Medicare Other

## 2015-11-11 ENCOUNTER — Encounter: Payer: Self-pay | Admitting: Endocrinology

## 2015-11-11 ENCOUNTER — Ambulatory Visit (INDEPENDENT_AMBULATORY_CARE_PROVIDER_SITE_OTHER): Payer: Medicare Other | Admitting: Endocrinology

## 2015-11-11 VITALS — BP 124/78 | HR 78 | Ht 68.0 in

## 2015-11-11 DIAGNOSIS — E785 Hyperlipidemia, unspecified: Secondary | ICD-10-CM

## 2015-11-11 DIAGNOSIS — R7303 Prediabetes: Secondary | ICD-10-CM | POA: Diagnosis not present

## 2015-11-11 DIAGNOSIS — I10 Essential (primary) hypertension: Secondary | ICD-10-CM

## 2015-11-11 DIAGNOSIS — E039 Hypothyroidism, unspecified: Secondary | ICD-10-CM

## 2015-11-11 MED ORDER — ROSUVASTATIN CALCIUM 5 MG PO TABS: 5.0000 mg | ORAL_TABLET | Freq: Every day | ORAL | 3 refills | Status: DC

## 2015-11-11 NOTE — Patient Instructions (Signed)
Stop potassium.

## 2015-11-11 NOTE — Progress Notes (Signed)
Subjective:     Patient ID: Angela [name], female   DOB: 1932/09/02, 82 y.o.   MRN: [id number]  HPI  Reason for visit:   General follow-up  History of pulmonary embolism: Currently she is not complaining of shortness of breath or leg edema. She says that she has mild transient shortness of breath at night occasionally  She is taking Eliquis twice a day consistently  Hypertension:  She has had long-standing hypertension which has been well controlled usually.  Currently on Lotensin HCT and also on potassium supplements  No lightheadedness  OTHER active problems are detailed in review of systems:     Medication List       Accurate as of 11/11/15 11:24 AM. Always use your most recent med list.          apixaban 5 MG Tabs tablet Commonly known as:  ELIQUIS Take 1 tablet (5 mg total) by mouth 2 (two) times daily.   benazepril-hydrochlorthiazide 20-12.5 MG tablet Commonly known as:  LOTENSIN HCT TAKE 1 TABLET ONCE DAILY.   furosemide 20 MG tablet Commonly known as:  LASIX Take 1 tablet (20 mg total) by mouth daily as needed for edema (if weight high by 2 pounds in 24hrs, weigh yourself daily).   isosorbide mononitrate 30 MG 24 hr tablet Commonly known as:  IMDUR TAKE 1 TABLET ONCE DAILY.   levothyroxine 100 MCG tablet Commonly known as:  SYNTHROID Take 1 tablet (100 mcg total) by mouth daily.   pantoprazole 40 MG tablet Commonly known as:  PROTONIX TAKE 1 TABLET ONCE DAILY.   PERCOCET 10-325 MG tablet Generic drug:  oxyCODONE-acetaminophen Take 0.25-1 tablets by mouth every 6 (six) hours as needed for pain.   polyethylene glycol packet Commonly known as:  MIRALAX / GLYCOLAX Take 17 g by mouth 3 (three) times daily as needed.   potassium chloride 10 MEQ tablet Commonly known as:  K-DUR Take 2 tablets daily   Vitamin D (Ergocalciferol) 50000 units Caps capsule Commonly known as:  DRISDOL TAKE ONE CAPSULE ONCE A WEEK.   VOLTAREN 1 %  Gel Generic drug:  diclofenac sodium Apply 2 g topically 4 (four) times daily as needed (pain). To knees       Review of Systems   Primary hypothyroidism: She has had long-standing primary hypothyroidism.  She has been on consistent dosage.  Recently she has been taking levothyroxine 100 g daily TSH normal on previous visit    She is not always compliant with taking her medication in the morning   Lab Results  Component Value Date   TSH 1.63 09/09/2015   TSH 7.22 (H) 07/29/2015   TSH 1.78 03/26/2015   FREET4 1.10 07/29/2015   FREET4 1.11 08/29/2014   FREET4 1.23 04/30/2014    HYPERLIPIDEMIA: Currently not on treatment  Lab Results  Component Value Date   CHOL 248 (H) 11/04/2015   CHOL 280 (H) 07/29/2015   CHOL 246 (H) 03/25/2015   Lab Results  Component Value Date   HDL 54.50 11/04/2015   HDL 49.20 07/29/2015   HDL 58.60 03/25/2015   Lab Results  Component Value Date   LDLCALC 174 (H) 11/04/2015   LDLCALC 196 (H) 07/29/2015   LDLCALC 167 (H) 03/25/2015   Lab Results  Component Value Date   TRIG 101.0 11/04/2015   TRIG 173.0 (H) 07/29/2015   TRIG 101.0 03/25/2015   Lab Results  Component Value Date   CHOLHDL 5 11/04/2015   CHOLHDL 6 07/29/2015  CHOLHDL 4 03/25/2015   Lab Results  Component Value Date   LDLDIRECT 193.5 02/07/2013   LDLDIRECT 189.5 12/11/2012   LDLDIRECT 201.3 12/05/2012        Objective:   Physical Exam  Cardiovascular: Normal rate, regular rhythm and normal heart sounds.  Exam reveals no gallop.   No murmur heard. Pulmonary/Chest: Breath sounds normal. No respiratory distress. She has no wheezes. She has no rales.  Musculoskeletal: She exhibits no edema.  Skin: Skin is warm and dry.    BP 118/70   Pulse 78   Ht 5\' 8"  (1.727 m)   SpO2 96%      Assessment:     History of pulmonary embolism probably secondary to the vein thrombosis related to prolonged sitting and resulting edema Currently started on anticoagulation  and clinically doing well Not clear why she has occasional transient shortness of breath at night No evidence of CHF on exam  HYPERTENSION: Fairly well controlled, no orthostasis  HYPOTHYROIDISM: Will have follow-up levels on the next visit  History of hyperlipidemia: Patient usually noncompliant with taking her medications because of fear of side effects, she thinks she had muscle pain with pravastatin but this is not documented  History of leukocytosis partly related to steroid injections.  Will continue to follow  History of prediabetes: Nonfasting glucose 127, she thinks this is from getting injection in knee with the steroid     Plan:     Recommend that she follow-up with cardiologist, he can also review her recent aortic aneurysm  Likely continue Eliquis for at least one year, she will also discuss with hematologist who is going to see her in October  HYPERLIPIDEMIA: She agrees to start 5 mg Crestor every other day for persistent hypercholesterolemia However she does not need to take any potassium supplements currently  Follow-up in 3 months      [facility] 11/11/15

## 2015-11-20 ENCOUNTER — Other Ambulatory Visit: Payer: Self-pay | Admitting: Endocrinology

## 2015-11-25 DIAGNOSIS — M170 Bilateral primary osteoarthritis of knee: Secondary | ICD-10-CM | POA: Diagnosis not present

## 2015-12-22 DIAGNOSIS — M170 Bilateral primary osteoarthritis of knee: Secondary | ICD-10-CM | POA: Diagnosis not present

## 2015-12-23 ENCOUNTER — Telehealth: Payer: Self-pay | Admitting: Oncology

## 2015-12-23 ENCOUNTER — Ambulatory Visit (HOSPITAL_BASED_OUTPATIENT_CLINIC_OR_DEPARTMENT_OTHER): Payer: Medicare Other | Admitting: Oncology

## 2015-12-23 ENCOUNTER — Other Ambulatory Visit (HOSPITAL_BASED_OUTPATIENT_CLINIC_OR_DEPARTMENT_OTHER): Payer: Medicare Other

## 2015-12-23 VITALS — BP 134/76 | HR 94 | Temp 97.5°F | Resp 17 | Ht 68.0 in

## 2015-12-23 DIAGNOSIS — Z853 Personal history of malignant neoplasm of breast: Secondary | ICD-10-CM

## 2015-12-23 DIAGNOSIS — Z7901 Long term (current) use of anticoagulants: Secondary | ICD-10-CM

## 2015-12-23 DIAGNOSIS — D72829 Elevated white blood cell count, unspecified: Secondary | ICD-10-CM

## 2015-12-23 DIAGNOSIS — Z86711 Personal history of pulmonary embolism: Secondary | ICD-10-CM

## 2015-12-23 LAB — CBC WITH DIFFERENTIAL/PLATELET
BASO%: 0.3 % (ref 0.0–2.0)
BASOS ABS: 0 10*3/uL (ref 0.0–0.1)
EOS%: 0 % (ref 0.0–7.0)
Eosinophils Absolute: 0 10*3/uL (ref 0.0–0.5)
HCT: 44.6 % (ref 34.8–46.6)
HGB: 15 g/dL (ref 11.6–15.9)
LYMPH%: 9.9 % — AB (ref 14.0–49.7)
MCH: 29.7 pg (ref 25.1–34.0)
MCHC: 33.6 g/dL (ref 31.5–36.0)
MCV: 88.2 fL (ref 79.5–101.0)
MONO#: 0.7 10*3/uL (ref 0.1–0.9)
MONO%: 3.9 % (ref 0.0–14.0)
NEUT#: 15.7 10*3/uL — ABNORMAL HIGH (ref 1.5–6.5)
NEUT%: 85.9 % — AB (ref 38.4–76.8)
Platelets: 211 10*3/uL (ref 145–400)
RBC: 5.05 10*6/uL (ref 3.70–5.45)
RDW: 13.3 % (ref 11.2–14.5)
WBC: 18.2 10*3/uL — ABNORMAL HIGH (ref 3.9–10.3)
lymph#: 1.8 10*3/uL (ref 0.9–3.3)

## 2015-12-23 NOTE — Progress Notes (Signed)
  [place] OFFICE PROGRESS NOTE   Diagnosis: Leukocytosis  INTERVAL HISTORY:   She was admitted in June with bilateral pulmonary embolism. She is now maintained on Eliquis. She presented with bilateral leg swelling and dyspnea. She reports leg swelling was present when she arrived after driving to the beach.  She has been referred to Dr. Roxan Hockey to evaluate and aortic aneurysm.  [name] reports undergoing steroid injections in both knees yesterday. She plans to schedule a breast MRI and examination with Dr. Nori Riis.  Objective:  Vital signs in last 24 hours:  Blood pressure 134/76, pulse 94, temperature 97.5 F (36.4 C), temperature source Oral, resp. rate 17, height _0  (1.727 m), SpO2 98 %.    HEENT: Neck without mass Lymphatics: No cervical, supra-clavicular, axillary, or inguinal nodes Resp: Lungs clear bilaterally Cardio: Regular rate and rhythm GI: No hepatosplenomegaly Vascular: No leg edema Breasts: Bilateral implants in place. No mass at the right breast or either chest wall      Lab Results:  Lab Results  Component Value Date   WBC 18.2 (H) 12/23/2015   HGB 15.0 12/23/2015   HCT 44.6 12/23/2015   MCV 88.2 12/23/2015   PLT 211 12/23/2015   NEUTROABS 15.7 (H) 12/23/2015     Medications: I have reviewed the patient's current medications.  Assessment/Plan: 1. Leukocytosis-mild neutrophilia 2. Knee arthritis 3. BRCA2 carrier 4. Remote history of breast cancer 5. Hypothyroidism 6. Hyperlipidemia 7. Family history of multiple cancers 8. Bilateral pulmonary embolism June 2017-maintained on Apixaban    Disposition:  She is stable from a hematologic standpoint. The neutrophilia today may be in part related to the steroid injections. She would like to continue follow-up in the Hematology clinic. She will return for an office visit and CBC in 6 months.  She will schedule an MRI for breast cancer surveillance and an  examination with Dr. Nori Riis.  She will continue anticoagulation therapy. I recommend indefinite anticoagulation given the extent of pulmonary embolism she experienced in June of this year. There was no clear preceding risk for pulmonary embolism.    Betsy Coder, MD  12/23/2015  11:24 AM

## 2015-12-23 NOTE — Telephone Encounter (Signed)
Spoke with patient re lab/fu April

## 2015-12-30 ENCOUNTER — Ambulatory Visit (INDEPENDENT_AMBULATORY_CARE_PROVIDER_SITE_OTHER): Payer: Medicare Other | Admitting: *Deleted

## 2015-12-30 DIAGNOSIS — Z23 Encounter for immunization: Secondary | ICD-10-CM | POA: Diagnosis not present

## 2016-01-08 ENCOUNTER — Other Ambulatory Visit: Payer: Self-pay | Admitting: *Deleted

## 2016-01-08 ENCOUNTER — Telehealth: Payer: Self-pay | Admitting: *Deleted

## 2016-01-08 MED ORDER — AZITHROMYCIN 250 MG PO TABS: ORAL_TABLET | ORAL | 0 refills | Status: DC

## 2016-01-08 NOTE — Telephone Encounter (Signed)
Patient called, she said she that she's been sick since 2 am today, she has a fever of 101, she's coughing up dark yellow stuff, has the chills.  She wants to know if you can please send in an antibiotic for her, she also says she has a lot of sinus drainage.  Please advise.

## 2016-01-08 NOTE — Telephone Encounter (Signed)
ZPack

## 2016-01-08 NOTE — Telephone Encounter (Signed)
Noted, rx sent.

## 2016-01-12 ENCOUNTER — Telehealth: Payer: Self-pay | Admitting: *Deleted

## 2016-01-12 ENCOUNTER — Other Ambulatory Visit: Payer: Self-pay | Admitting: *Deleted

## 2016-01-12 MED ORDER — HYDROCOD POLST-CPM POLST ER 10-8 MG/5ML PO SUER: ORAL | 0 refills | Status: DC

## 2016-01-12 NOTE — Telephone Encounter (Signed)
Noted, patient is aware, rx is on your table waiting for signature.

## 2016-01-12 NOTE — Telephone Encounter (Signed)
Patient called and said she has been coughing horribly all weekend,  She wants to know if you can give her some Tussionex for it?   She's also requesting another z-pak as it usually takes 2 rx's for this to clear up for her.  Please advise.

## 2016-01-12 NOTE — Telephone Encounter (Signed)
Z-Pack normally continues to work for 10 days.  Is her sputum reduced? We can send 60 mL of Tussionex to take 1 teaspoon twice a day

## 2016-01-13 ENCOUNTER — Encounter: Payer: Self-pay | Admitting: Thoracic Surgery (Cardiothoracic Vascular Surgery)

## 2016-01-13 ENCOUNTER — Institutional Professional Consult (permissible substitution) (INDEPENDENT_AMBULATORY_CARE_PROVIDER_SITE_OTHER): Payer: Medicare Other | Admitting: Thoracic Surgery (Cardiothoracic Vascular Surgery)

## 2016-01-13 VITALS — BP 80/60 | HR 105 | Ht 68.0 in | Wt 232.0 lb

## 2016-01-13 DIAGNOSIS — I712 Thoracic aortic aneurysm, without rupture, unspecified: Secondary | ICD-10-CM

## 2016-01-13 DIAGNOSIS — I700 Atherosclerosis of aorta: Secondary | ICD-10-CM

## 2016-01-13 DIAGNOSIS — I7121 Aneurysm of the ascending aorta, without rupture: Secondary | ICD-10-CM | POA: Insufficient documentation

## 2016-01-13 NOTE — Progress Notes (Signed)
PCP is [name], MD Referring Provider is Lacretia Leigh, MD  Chief Complaint  Patient presents with  . TAA    CTA CHEST 08/30/15    HPI: Mrs. Ward [name] Sent for consultation regarding an ascending aortic aneurysm  Mrs. Ward [name] is an 82 year old woman with a history of breast cancer, type 2 diabetes, hypertension, heart murmur, hyperlipidemia, osteoarthritis, and gastroesophageal reflux. She was hospitalized in June with DVT and pulmonary emboli. On the CT angiogram she was noted to have a 4 cm ascending aorta. She is on Eliquis for her pulmonary emboli.  She is not currently having any chest pain. She does get short of breath with exertion. She still has leg swelling.  Past Medical History:  Diagnosis Date  . Arthritis   . BRCA2 positive 10/214  . Breast cancer ([place]) 1994   left sided cancer; unilateral mastectomy, no chemo or radiation  . Diabetes mellitus   . Dyslipidemia   . GERD (gastroesophageal reflux disease)   . Heart murmur   . Hypertension     Past Surgical History:  Procedure Laterality Date  . CARDIAC CATHETERIZATION     NORMAL CORONARY ARTERIES  . CHOLECYSTECTOMY    . MASTECTOMY     LEFT BREAST  . TONSILLECTOMY      Family History  Problem Relation Age of Onset  . Heart attack Maternal Grandmother   . Pancreatic cancer Mother 37  . Bladder Cancer Father 102    heavy smoker and drinker  . Ovarian cancer Sister 53  . Hypertension Sister   . Lung cancer Brother 40  . Ovarian cancer Maternal Aunt 62  . Stomach cancer Maternal Grandfather   . Bone cancer Paternal Grandmother     unsure if this was a primary cancer  . Stomach cancer Paternal Grandfather   . Lung cancer Maternal Aunt     died in her 58s; former smoker  . Breast cancer Cousin     dx in her late 5s to 61s; paternal cousin    Social History Social History  Substance Use Topics  . Smoking status: Never Smoker  . Smokeless tobacco: Never Used  . Alcohol use No    Current  Outpatient Prescriptions  Medication Sig Dispense Refill  . azithromycin (ZITHROMAX) 250 MG tablet Take 2 tablets the first day and 1 tablet for the next 4 days 6 tablet 0  . benazepril-hydrochlorthiazide (LOTENSIN HCT) 20-12.5 MG tablet TAKE 1 TABLET ONCE DAILY. 30 tablet 5  . chlorpheniramine-HYDROcodone (TUSSIONEX PENNKINETIC ER) 10-8 MG/5ML SUER Take 1 teaspoon 2 times daily as needed for cough 60 mL 0  . ELIQUIS 5 MG TABS tablet TAKE 1 TABLET TWICE DAILY. 60 tablet 5  . furosemide (LASIX) 20 MG tablet Take 1 tablet (20 mg total) by mouth daily as needed for edema (if weight high by 2 pounds in 24hrs, weigh yourself daily). 30 tablet 0  . isosorbide mononitrate (IMDUR) 30 MG 24 hr tablet TAKE 1 TABLET ONCE DAILY. 30 tablet 5  . levothyroxine (SYNTHROID) 100 MCG tablet Take 1 tablet (100 mcg total) by mouth daily. 100 tablet 1  . PERCOCET 10-325 MG per tablet Take 0.25-1 tablets by mouth every 6 (six) hours as needed for pain.     . polyethylene glycol (MIRALAX / GLYCOLAX) packet Take 17 g by mouth 3 (three) times daily as needed. (Patient taking differently: Take 17 g by mouth 3 (three) times daily as needed for moderate constipation. ) 30 each 0  . Vitamin D, Ergocalciferol, (  DRISDOL) 50000 units CAPS capsule TAKE ONE CAPSULE ONCE A WEEK. 4 capsule 5  . VOLTAREN 1 % GEL Apply 2 g topically 4 (four) times daily as needed (pain). To knees    . pantoprazole (PROTONIX) 40 MG tablet TAKE 1 TABLET ONCE DAILY. 30 tablet 5   No current facility-administered medications for this visit.     Allergies  Allergen Reactions  . Gadolinium      Desc: pt states she had nausea after receiving MAGNEVIST for breast imaging   . Pravastatin Other (See Comments)    Locked jaw, weakness  . Simvastatin Other (See Comments)    Weakness and locked jaw.  Marland Kitchen Crestor [Rosuvastatin Calcium] Other (See Comments)    Abdominal discomfort  . Penicillins Swelling and Rash    Has patient had a PCN reaction causing  immediate rash, facial/tongue/throat swelling, SOB or lightheadedness with hypotension: yes Has patient had a PCN reaction causing severe rash involving mucus membranes or skin necrosis: unknown Has patient had a PCN reaction that required hospitalization: unknown Has patient had a PCN reaction occurring within the last 10 years: no If all of the above answers are "NO", then may proceed with Cephalosporin use.     Review of Systems  Constitutional: Positive for activity change. Negative for appetite change and unexpected weight change.  HENT: Negative for trouble swallowing and voice change.   Respiratory: Positive for shortness of breath (with exertion). Negative for cough and wheezing.   Cardiovascular: Positive for leg swelling. Negative for chest pain.       Blood clots  Gastrointestinal: Positive for constipation. Negative for abdominal pain and blood in stool.  Genitourinary: Negative for difficulty urinating and dysuria.  Musculoskeletal: Positive for arthralgias and joint swelling.  Neurological: Negative for dizziness and syncope.  Hematological: Negative for adenopathy. Bruises/bleeds easily.  All other systems reviewed and are negative.   BP (!) 80/60   Pulse (!) 105   Ht _0  (1.727 m)   Wt 232 lb (105.2 kg)   SpO2 94% Comment: ON RA  BMI 35.28 kg/m  Physical Exam  Constitutional: She is oriented to person, place, and time. No distress.  obese  HENT:  Head: Normocephalic.  Eyes: Conjunctivae and EOM are normal. No scleral icterus.  Neck: Neck supple. No thyromegaly present.  Cardiovascular: Normal rate and regular rhythm.  Exam reveals no gallop.   Murmur (2/6 systolic) heard. Pulmonary/Chest: Effort normal and breath sounds normal. No respiratory distress. She has no wheezes. She has no rales.  Abdominal: Soft. She exhibits no distension. There is no tenderness.  Musculoskeletal: She exhibits edema (1+ non pitting).  Lymphadenopathy:    She has no cervical  adenopathy.  Neurological: She is alert and oriented to person, place, and time. No cranial nerve deficit.  No focal motor deficit  Skin: Skin is warm and dry.  Vitals reviewed.  Diagnostic Tests: CT ANGIOGRAPHY CHEST WITH CONTRAST 08/30/2015  TECHNIQUE: Multidetector CT imaging of the chest was performed using the standard protocol during bolus administration of intravenous contrast. Multiplanar CT image reconstructions and MIPs were obtained to evaluate the vascular anatomy.  CONTRAST:  100 mL Isovue 370 IV  COMPARISON:  None.  FINDINGS: Vascular: Right arm IV contrast administration. SVC patent. Right atrium nondilated. RV/LV ratio 1.3, elevated. There are central, segmental and subsegmental emboli in pulmonary artery branches to right upper, middle, lower, and left lower lobes. Patent bilateral pulmonary veins drain into the left atrium. Scattered coronary calcifications. There is good contrast opacification  of the thoracic aorta. Mild dilatation of the ascending segment up to 4 cm transverse diameter proximally, returning to normal caliber distally through the arch and descending segment. Scattered arch and descending thoracic aortic calcifications. No dissection or stenosis. Classic 3 vessel brachiocephalic arterial origin anatomy without proximal stenosis.  Mediastinum/Lymph Nodes: No masses or pathologically enlarged lymph nodes identified. No pericardial effusion.  Lungs/Pleura: No pulmonary mass, infiltrate, or effusion.  Upper abdomen: No acute findings. 19 mm subpleural fluid attenuation lesion in hepatic segment 7 possibly cyst.  Musculoskeletal: Remote T12 compression fracture post kyphoplasty/ vertebroplasty. Stable mild T9 compression deformity. Mild spondylitic change in the visualized lower cervical spine.  Review of the MIP images confirms the above findings.  IMPRESSION: 1. Bilateral acute PE with CT evidence of right heart strain  (RV/LV Ratio = 1.3) consistent with at least submassive (intermediate risk) PE. The presence of right heart strain has been associated with an increased risk of morbidity and mortality. Please activate Code PE by paging [phone]. Critical Value/emergent results were called by telephone at the time of interpretation on 08/30/2015 at 12:54 pm to Dr. Francine Graven , who verbally acknowledged these results. 2. Atherosclerosis, including aortic and coronary artery disease. Please note that although the presence of coronary artery calcium documents the presence of coronary artery disease, the severity of this disease and any potential stenosis cannot be assessed on this non-gated CT examination. Assessment for potential risk factor modification, dietary therapy or pharmacologic therapy may be warranted, if clinically indicated. 3. 4 cm ascending thoracic aortic aneurysm without complicating features. Recommend annual imaging followup by CTA or MRA. This recommendation follows 2010 ACCF/AHA/AATS/ACR/ASA/SCA/SCAI/SIR/STS/SVM Guidelines for the Diagnosis and Management of Patients with Thoracic Aortic Disease. Circulation. 2010; 121: C458-A835 4. Old T9 and T12 compression deformities.   Electronically Signed   By: Lucrezia Europe M.D.   On: 08/30/2015 12:56 I personally reviewed the CT chest from June of this year. I concur with the findings noted above.  Impression: 82 year old woman who was incidentally noted to have a 4 cm ascending aortic aneurysm on a CT angiogram that was done to evaluate and was positive for pulmonary emboli. This does barely meet the criteria for an ascending aneurysm.  There is no indication for surgery at the present time. Indications for surgery would be a size of 5.5 cm or increase in size of greater than 5 mm over 6 months.  The mainstay of treatment is blood pressure control. Her blood pressure is actually low today. She has been ill and is currently on  antibiotics. I suspect she is dehydrated.  At her age she is unlikely to ever need aneurysm repair, but does need to be followed.  Plan:  Return in 8 months (1 year from her original scan) with CT angio of chest  Melrose Nakayama, MD [facility] [phone]

## 2016-01-16 ENCOUNTER — Other Ambulatory Visit: Payer: Self-pay | Admitting: Endocrinology

## 2016-01-20 DIAGNOSIS — M170 Bilateral primary osteoarthritis of knee: Secondary | ICD-10-CM | POA: Diagnosis not present

## 2016-02-02 ENCOUNTER — Emergency Department (HOSPITAL_COMMUNITY): Payer: Medicare Other

## 2016-02-02 ENCOUNTER — Encounter (HOSPITAL_COMMUNITY): Payer: Self-pay | Admitting: *Deleted

## 2016-02-02 ENCOUNTER — Emergency Department (HOSPITAL_COMMUNITY)
Admission: EM | Admit: 2016-02-02 | Discharge: 2016-02-02 | Disposition: A | Discharge status: Home / Self Care | Payer: Medicare Other | Attending: Emergency Medicine | Admitting: Emergency Medicine

## 2016-02-02 DIAGNOSIS — S8992XA Unspecified injury of left lower leg, initial encounter: Secondary | ICD-10-CM | POA: Diagnosis present

## 2016-02-02 DIAGNOSIS — E119 Type 2 diabetes mellitus without complications: Secondary | ICD-10-CM | POA: Diagnosis not present

## 2016-02-02 DIAGNOSIS — E039 Hypothyroidism, unspecified: Secondary | ICD-10-CM | POA: Insufficient documentation

## 2016-02-02 DIAGNOSIS — Z7901 Long term (current) use of anticoagulants: Secondary | ICD-10-CM | POA: Insufficient documentation

## 2016-02-02 DIAGNOSIS — S82145A Nondisplaced bicondylar fracture of left tibia, initial encounter for closed fracture: Secondary | ICD-10-CM | POA: Insufficient documentation

## 2016-02-02 DIAGNOSIS — Z853 Personal history of malignant neoplasm of breast: Secondary | ICD-10-CM | POA: Diagnosis not present

## 2016-02-02 DIAGNOSIS — Y929 Unspecified place or not applicable: Secondary | ICD-10-CM | POA: Diagnosis not present

## 2016-02-02 DIAGNOSIS — Y939 Activity, unspecified: Secondary | ICD-10-CM | POA: Insufficient documentation

## 2016-02-02 DIAGNOSIS — M179 Osteoarthritis of knee, unspecified: Secondary | ICD-10-CM | POA: Insufficient documentation

## 2016-02-02 DIAGNOSIS — Y999 Unspecified external cause status: Secondary | ICD-10-CM | POA: Insufficient documentation

## 2016-02-02 DIAGNOSIS — S82132A Displaced fracture of medial condyle of left tibia, initial encounter for closed fracture: Secondary | ICD-10-CM

## 2016-02-02 DIAGNOSIS — M1712 Unilateral primary osteoarthritis, left knee: Secondary | ICD-10-CM

## 2016-02-02 DIAGNOSIS — T148XXA Other injury of unspecified body region, initial encounter: Secondary | ICD-10-CM | POA: Diagnosis not present

## 2016-02-02 DIAGNOSIS — W19XXXA Unspecified fall, initial encounter: Secondary | ICD-10-CM

## 2016-02-02 DIAGNOSIS — M25562 Pain in left knee: Secondary | ICD-10-CM | POA: Diagnosis not present

## 2016-02-02 DIAGNOSIS — I10 Essential (primary) hypertension: Secondary | ICD-10-CM | POA: Diagnosis not present

## 2016-02-02 DIAGNOSIS — W1839XA Other fall on same level, initial encounter: Secondary | ICD-10-CM | POA: Diagnosis not present

## 2016-02-02 DIAGNOSIS — S82142A Displaced bicondylar fracture of left tibia, initial encounter for closed fracture: Secondary | ICD-10-CM | POA: Diagnosis not present

## 2016-02-02 MED ORDER — OXYCODONE-ACETAMINOPHEN 5-325 MG PO TABS: 0.5000 | ORAL_TABLET | Freq: Once | ORAL | Status: DC

## 2016-02-02 NOTE — ED Provider Notes (Signed)
[facility] DEPT Provider Note   CSN: [id number] Arrival date & time: 02/02/16  1404     History   Chief Complaint Chief Complaint  Patient presents with  . Knee Pain    HPI Angela [name] is a 81 y.o. female.  HPI Angela [name] is a 82 y.o. female with PMH significant for arthritis, DM, dyslipidemia, and HTN who presents with left knee pain x 9 days.  Patient states she was making coffee last Saturday when a shelf fell on her causing her to fall forward her left knee go awkwardly underneath her body.  She denies head injury or LOC.  She denies pain elsewhere.  No numbness, weakness, wound.  She takes half a tablet of percocet.  She lives at home alone and has had difficulty getting up the stairs.  She is ambulatory with a cane and walker   Past Medical History:  Diagnosis Date  . Arthritis   . BRCA2 positive 10/214  . Breast cancer ([place]) 1994   left sided cancer; unilateral mastectomy, no chemo or radiation  . Diabetes mellitus   . Dyslipidemia   . GERD (gastroesophageal reflux disease)   . Heart murmur   . Hypertension     Patient Active Problem List   Diagnosis Date Noted  . Ascending aortic aneurysm ([place]) 01/13/2016  . Thoracic aortic atherosclerosis ([place]) 01/13/2016  . Pulmonary embolism without acute cor pulmonale ([place])   . Pulmonary emboli ([place]) 08/30/2015  . Acute massive pulmonary embolism ([place]) 08/30/2015  . Leukocytosis 12/23/2014  . BRCA2 positive   . Hypothyroidism (acquired) 12/04/2012  . Heart murmur 09/01/2012  . Chest pain 09/17/2010  . Hypertension   . Dyslipidemia   . Type II or unspecified type diabetes mellitus without mention of complication, not stated as uncontrolled   . Arthritis   . GERD (gastroesophageal reflux disease)     Past Surgical History:  Procedure Laterality Date  . CARDIAC CATHETERIZATION     NORMAL CORONARY ARTERIES  . CHOLECYSTECTOMY    . MASTECTOMY     LEFT BREAST  . TONSILLECTOMY      OB History    No data available       Home Medications    Prior to Admission medications   Medication Sig Start Date End Date Taking? Authorizing Provider  benazepril-hydrochlorthiazide (LOTENSIN HCT) 20-12.5 MG tablet TAKE 1 TABLET ONCE DAILY. 01/16/16  Yes [name], MD  ELIQUIS 5 MG TABS tablet TAKE 1 TABLET TWICE DAILY. Patient taking differently: TAKE 14m TABLET by mouth TWICE DAILY. 11/20/15  Yes AElayne Snare MD  furosemide (LASIX) 20 MG tablet Take 1 tablet (20 mg total) by mouth daily as needed for edema (if weight high by 2 pounds in 24hrs, weigh yourself daily). 09/02/15  Yes PThurnell Lose MD  isosorbide mononitrate (IMDUR) 30 MG 24 hr tablet TAKE 1 TABLET ONCE DAILY. Patient taking differently: TAKE 337mTABLET by mouth ONCE DAILY. 11/20/15  Yes AjElayne SnareMD  levothyroxine (SYNTHROID) 100 MCG tablet Take 1 tablet (100 mcg total) by mouth daily. 09/11/15  Yes AjElayne SnareMD  pantoprazole (PROTONIX) 40 MG tablet TAKE 1 TABLET ONCE DAILY. Patient taking differently: TAKE 4068mABLET by mouth ONCE DAILY. 08/25/15  Yes AjaElayne SnareD  PERCOCET 10-325 MG per tablet Take 0.25-1 tablets by mouth every 6 (six) hours as needed for pain.  02/19/14  Yes Historical Provider, MD  polyethylene glycol (MIRALAX / GLYCOLAX) packet Take 17 g by mouth 3 (three) times  daily as needed. Patient taking differently: Take 17 g by mouth 3 (three) times daily as needed for moderate constipation.  05/26/12  Yes Drenda Freeze, MD  Vitamin D, Ergocalciferol, (DRISDOL) 50000 units CAPS capsule TAKE ONE CAPSULE ONCE A WEEK. Patient taking differently: TAKE 40102 units by mouth ONCE A WEEK on thursdays 11/20/15  Yes [name], MD  VOLTAREN 1 % GEL Apply 2 g topically 4 (four) times daily as needed (pain). To knees 02/06/14  Yes Historical Provider, MD  azithromycin (ZITHROMAX) 250 MG tablet Take 2 tablets the first day and 1 tablet for the next 4 days Patient not taking: Reported on 02/02/2016 01/08/16   [name], MD    chlorpheniramine-HYDROcodone (TUSSIONEX PENNKINETIC ER) 10-8 MG/5ML SUER Take 1 teaspoon 2 times daily as needed for cough Patient not taking: Reported on 02/02/2016 01/12/16   [name], MD    Family History Family History  Problem Relation Age of Onset  . Heart attack Maternal Grandmother   . Pancreatic cancer Mother 66  . Bladder Cancer Father 1    heavy smoker and drinker  . Ovarian cancer Sister 44  . Hypertension Sister   . Lung cancer Brother 63  . Ovarian cancer Maternal Aunt 62  . Stomach cancer Maternal Grandfather   . Bone cancer Paternal Grandmother     unsure if this was a primary cancer  . Stomach cancer Paternal Grandfather   . Lung cancer Maternal Aunt     died in her 43s; former smoker  . Breast cancer Cousin     dx in her late 34s to 95s; paternal cousin    Social History Social History  Substance Use Topics  . Smoking status: Never Smoker  . Smokeless tobacco: Never Used  . Alcohol use No     Allergies   Gadolinium; Iodinated diagnostic agents; Pravastatin; Simvastatin; Crestor [rosuvastatin calcium]; and Penicillins   Review of Systems Review of Systems All other systems negative unless otherwise stated in HPI   Physical Exam Updated Vital Signs BP 115/78 (BP Location: Right Arm)   Pulse 84   Temp 99 F (37.2 C) (Oral)   Resp 18   SpO2 100%   Physical Exam  Constitutional: She is oriented to person, place, and time. She appears well-developed and well-nourished.  HENT:  Head: Normocephalic and atraumatic.  Right Ear: External ear normal.  Left Ear: External ear normal.  Eyes: Conjunctivae are normal. No scleral icterus.  Neck: No tracheal deviation present.  Cardiovascular:  Pulses:      Dorsalis pedis pulses are 2+ on the right side, and 2+ on the left side.  Pulmonary/Chest: Effort normal. No respiratory distress.  Abdominal: She exhibits no distension.  Musculoskeletal: Normal range of motion. She exhibits tenderness.  Left  knee: Mild swelling.  No wound.  Tender of medial tibial plateau.  Normal ROM. Compartments soft and compressible.   Neurological: She is alert and oriented to person, place, and time.  Normal strength and sensation.   Skin: Skin is warm and dry.  Psychiatric: She has a normal mood and affect. Her behavior is normal.     ED Treatments / Results  Labs (all labs ordered are listed, but only abnormal results are displayed) Labs Reviewed - No data to display  EKG  EKG Interpretation None       Radiology Ct Knee Left Wo Contrast  Result Date: 02/02/2016 CLINICAL DATA:  Tibial plateau fracture.  Fall. EXAM: CT OF THE left KNEE WITHOUT CONTRAST TECHNIQUE:  Multidetector CT imaging of the left knee was performed according to the standard protocol. Multiplanar CT image reconstructions were also generated. COMPARISON:  Radiographs from 02/02/2016 FINDINGS: Well-defined nondisplaced sagittally oriented fracture of the medial tibial plateau matching the finding on radiography. This is best seen at the articular surface and is less sharply defined distally but believed exit in the vicinity of the fused growth plate rim medially. This involves about a third of the medial tibial plateau surface. There is severe osteoarthritis with prominent articular space narrowing anteriorly but slightly splayed articular space posteriorly in both the medial and lateral compartments, and a lax extensor mechanism. Severe patellofemoral spurring and chondral thinning. The superior articular for surface in the lateral compartment is slightly concave up, but no definite lateral compartmental fracture is seen. This could be from chronic subsidence. There is a 10 mm free osteochondral fragment in the popliteus recess posterolaterally in the knee joint. Degenerative subcortical cystic lesions are present posteriorly in the lateral tibial plateau and posteriorly in the lateral femoral condyle. Hemarthrosis. Regional muscular  atrophy. Mild meniscal chondrocalcinosis. IMPRESSION: 1. Sagittally oriented nondisplaced fracture of the medial tibial plateau, exiting medially in the vicinity of the original growth plate fusion site. 2. Upward scalloping of the lateral tibial plateau could be from chronic subsidence or less likely from acute impaction. I do not see definite bony discontinuity in the lateral tibial plateau. 3. Severe osteoarthritis. The knee is somewhat hyperextended during imaging. 4. Chondrocalcinosis, raising the possibility of CPPD arthropathy. 5. Hemarthrosis. 6. Severe spurring and chondral thinning. 7. 1 cm osteochondral free fragment, chronic, in the popliteus recess. Electronically Signed   By: Van Clines M.D.   On: 02/02/2016 16:48   Dg Knee Complete 4 Views Left  Result Date: 02/02/2016 CLINICAL DATA:  Left knee pain predominantly medially after a fall 7 days ago. Initial encounter. EXAM: LEFT KNEE - COMPLETE 4+ VIEW COMPARISON:  None. FINDINGS: The bones are osteopenic. On the AP image, there is a small area of lucency in the medial tibial plateau which appears to involve cortex without a clear step-off or depression. Focal cortical lucency is also seen on 1 of the oblique images, again without clear extension of the fracture more distally. There is no dislocation or evidence of a significant knee joint effusion. There is advanced lateral compartment osteoarthrosis with moderate to severe joint space narrowing, subchondral sclerosis, and marginal osteophytosis. Less pronounced degenerative changes are noted in the medial compartment. Prominent superior patellar spurring is present. Vascular calcifications are noted. IMPRESSION: Linear lucency in the medial tibial plateau which may reflect a nondisplaced fracture or artifact. The lack of a significant knee joint effusion argues against an acute fracture, however further evaluation with CT could be considered based on clinical concern. Electronically Signed    By: Logan Bores M.D.   On: 02/02/2016 15:15    Procedures Procedures (including critical care time)  Medications Ordered in ED Medications  oxyCODONE-acetaminophen (PERCOCET/ROXICET) 5-325 MG per tablet 0.5 tablet (not administered)     Initial Impression / Assessment and Plan / ED Course  I have reviewed the triage vital signs and the nursing notes.  Pertinent labs & imaging results that were available during my care of the patient were reviewed by me and considered in my medical decision making (see chart for details).  Clinical Course    Patient presents with mechanical fall 9 days ago now with persistent left knee pain.  She is ambulatory, but is not able to safely ambulate at  home.  She lives in 2 story home, and has not been able to get up the stairs.  Neurovascularly intact.  Exquisite tenderness to medial tibial plateau with some swelling.  Plain films obtained and showed linear lucency in medial tibial plateau question artifact vs nondisplaced fracture.  CT obtained due to point tenderness and showed nondisplaced fracture of medial tibial plateau.  Orthopedics consulted, patient will be made NWB and placed in knee immobilizer.  She will follow up with Dr. Lyla Glassing in 2 weeks.  CM consulted and hospital bed, wheelchair, PT, RN, HH, and SW ordered.  She has percocet at home.  Return precautions discussed.  Stable for discharge.   Case has been discussed with and seen by Dr. Regenia Skeeter who agrees with the above plan for discharge.    Final Clinical Impressions(s) / ED Diagnoses   Final diagnoses:  Closed fracture of medial portion of left tibial plateau, initial encounter  Fall, initial encounter  Arthritis of left knee    New Prescriptions New Prescriptions   No medications on file     Adrian Specht, PA-C 02/02/16 [place], MD 02/09/16 [phone]

## 2016-02-02 NOTE — Progress Notes (Signed)
Patient suffers from left knee pain, non weight bearing severe osteoarthritis which impairs their ability to perform daily activities like ambulating in the home. A walker, cane will not resolve  issue with performing activities of daily living. A wheelchair will allow patient to safely perform daily activities. Patient is not able to propel themselves in the home using a standard weight wheelchair due to weakness, endurance. Patient can self propel in the lightweight wheelchair.  Accessories: elevating leg rests (ELRs), wheel locks, extensions and anti-tippers.

## 2016-02-02 NOTE — Progress Notes (Signed)
EDCM spoke to patient at bedside.  Patient is agreeable to receive wheelchair from [facility].  She reports her other equipment is coming from [facility] as well.  Patient is agreeable to pick wheelchair up from the [facility] store.  [facility] provided patient with phone number to [facility] to call and check status of wheelchair order.  [facility] faxed order for wheelchair to [facility] with confirmation of receipt.  No further EDCM needs at this time.

## 2016-02-02 NOTE — Progress Notes (Signed)
Pt with order for hospital bed  PMH significant for arthritis, DM, dyslipidemia, and HTN who presents with left knee pain x 9 days.

## 2016-02-02 NOTE — Progress Notes (Signed)
CM consult for assistance with hospital bed Pt in imaging when CM initially went to visit her per RN Awaited pt return to [facility]

## 2016-02-02 NOTE — Discharge Instructions (Signed)
You suffered a left medial tibial plateau fracture in your fall last week.  DO NOT BEAR WEIGHT ON THIS LEG.  Wear the immobilizer at all times.  Take your percocet every 4-6 hours as needed for pain.  Please call Dr. Sid Falcon office to schedule an appointment in 2 weeks.  You will receive your wheelchair and hospital bed tomorrow.  Return to the ED for sudden worsening pain, increased swelling with associated numbness, color changes, or any new or concerning symptoms.

## 2016-02-02 NOTE — Progress Notes (Signed)
ED CM spoke with pt and a female friend sitting beside her stretcher about cm consult for hospital bed, assessed other needs Pt confirms lives alone in 2 story home and has not been able to get to her second floor bed Requesting hospital bed and home health services  CM reviewed lists of home health agencies in [facility] along with a list of private duty nursing (PDN) agencies Pt friend spoke with pt about [facility] she uses for a cpap Pt chooses [facility] for her DME and home health services  CM reviewed in details medicare guidelines, Choices of home health [facility]) (length of stay in home, types of [facility] staff available, coverage, primary caregiver, up to 24 hrs before services may be started) and choices of Private duty nursing (PDN-coverage, length of stay in the home types of staff available). CM reviewed availability of [address] [facility] to assist pcp to get pt to snf (if desired disposition) from the community level. CM provided pt/family with a list of [place] home health agencies and PDN.  Orders entered in EPIC and CM spoke with [facility] PA about needed face to face CM spoke with Manuela Schwartz of [facility] about pt needed DME and home health services Plus sent text to [facility] dme staff at [phone]

## 2016-02-02 NOTE — Progress Notes (Signed)
Offered written info to pt on [facility] for transportation, meals on wheels etc Pt appreciative of services and resources offered

## 2016-02-02 NOTE — ED Triage Notes (Signed)
Pt fell last Saturday in her closet. Pt states she has had pain and swelling to left knee since and would like to be evaluated. Pt has been able to ambulate since the fall.

## 2016-02-02 NOTE — ED Notes (Signed)
Bed: [facility] Expected date:  Expected time:  Means of arrival:  Comments: ems 

## 2016-02-03 ENCOUNTER — Other Ambulatory Visit: Payer: Medicare Other

## 2016-02-03 ENCOUNTER — Telehealth: Payer: Self-pay | Admitting: Endocrinology

## 2016-02-03 NOTE — Telephone Encounter (Signed)
Due to her hospital visit yesterday

## 2016-02-03 NOTE — Telephone Encounter (Signed)
Pt called to speak with Lattie Haw, she requests call back.

## 2016-02-03 NOTE — Telephone Encounter (Signed)
Pt has called 3 times in 2 hours she is wanting to see if Dr. Dwyane Dee can refer and get her into [facility] rehab.

## 2016-02-03 NOTE — Telephone Encounter (Signed)
?   What for

## 2016-02-04 NOTE — Telephone Encounter (Signed)
Spoke with director at [employer] pt would need to be put in the hospital for at least 3 days before [facility] will cover her to stay there.

## 2016-02-04 NOTE — Telephone Encounter (Signed)
Pt is aware and will be calling EMS tonight

## 2016-02-04 NOTE — Telephone Encounter (Signed)
Ask [place] for what I need to do

## 2016-02-04 NOTE — Telephone Encounter (Signed)
Today pt is having a hard time breathing  Pt aware of information for [facility]

## 2016-02-04 NOTE — Telephone Encounter (Signed)
She should go back to the ER to be checked out for her breathing problem

## 2016-02-04 NOTE — Telephone Encounter (Signed)
Pt is asking for [place] to help her, she has broken a bone in her knee and needs [place] to reach out to [facility] [phone]  LM for admin director at [facility] to call back to help [place] know what we need to do

## 2016-02-06 ENCOUNTER — Other Ambulatory Visit: Payer: Medicare Other

## 2016-02-07 DIAGNOSIS — K219 Gastro-esophageal reflux disease without esophagitis: Secondary | ICD-10-CM | POA: Diagnosis not present

## 2016-02-07 DIAGNOSIS — E785 Hyperlipidemia, unspecified: Secondary | ICD-10-CM | POA: Diagnosis not present

## 2016-02-07 DIAGNOSIS — Z86711 Personal history of pulmonary embolism: Secondary | ICD-10-CM | POA: Diagnosis not present

## 2016-02-07 DIAGNOSIS — E039 Hypothyroidism, unspecified: Secondary | ICD-10-CM | POA: Diagnosis not present

## 2016-02-07 DIAGNOSIS — Z9012 Acquired absence of left breast and nipple: Secondary | ICD-10-CM | POA: Diagnosis not present

## 2016-02-07 DIAGNOSIS — E119 Type 2 diabetes mellitus without complications: Secondary | ICD-10-CM | POA: Diagnosis not present

## 2016-02-07 DIAGNOSIS — M1712 Unilateral primary osteoarthritis, left knee: Secondary | ICD-10-CM | POA: Diagnosis not present

## 2016-02-07 DIAGNOSIS — Z1501 Genetic susceptibility to malignant neoplasm of breast: Secondary | ICD-10-CM | POA: Diagnosis not present

## 2016-02-07 DIAGNOSIS — Z853 Personal history of malignant neoplasm of breast: Secondary | ICD-10-CM | POA: Diagnosis not present

## 2016-02-07 DIAGNOSIS — S82132D Displaced fracture of medial condyle of left tibia, subsequent encounter for closed fracture with routine healing: Secondary | ICD-10-CM | POA: Diagnosis not present

## 2016-02-07 DIAGNOSIS — Z9181 History of falling: Secondary | ICD-10-CM | POA: Diagnosis not present

## 2016-02-07 DIAGNOSIS — I10 Essential (primary) hypertension: Secondary | ICD-10-CM | POA: Diagnosis not present

## 2016-02-09 ENCOUNTER — Telehealth: Payer: Self-pay | Admitting: Endocrinology

## 2016-02-09 DIAGNOSIS — S82132D Displaced fracture of medial condyle of left tibia, subsequent encounter for closed fracture with routine healing: Secondary | ICD-10-CM | POA: Diagnosis not present

## 2016-02-09 DIAGNOSIS — M1712 Unilateral primary osteoarthritis, left knee: Secondary | ICD-10-CM | POA: Diagnosis not present

## 2016-02-09 DIAGNOSIS — I10 Essential (primary) hypertension: Secondary | ICD-10-CM | POA: Diagnosis not present

## 2016-02-09 DIAGNOSIS — K219 Gastro-esophageal reflux disease without esophagitis: Secondary | ICD-10-CM | POA: Diagnosis not present

## 2016-02-09 DIAGNOSIS — E785 Hyperlipidemia, unspecified: Secondary | ICD-10-CM | POA: Diagnosis not present

## 2016-02-09 DIAGNOSIS — E119 Type 2 diabetes mellitus without complications: Secondary | ICD-10-CM | POA: Diagnosis not present

## 2016-02-09 NOTE — Telephone Encounter (Signed)
You may call [facility].  Need to know what medical reason she has for this

## 2016-02-09 NOTE — Telephone Encounter (Signed)
Pt called and said that we need to contact Cindra Eves with [facility] regarding her hospital bed. [phone]

## 2016-02-09 NOTE — Telephone Encounter (Signed)
You can call [facility]

## 2016-02-09 NOTE — Telephone Encounter (Signed)
Please advise 

## 2016-02-09 NOTE — Telephone Encounter (Signed)
Pt was at the ED on 02/02/16 for a non displaced tibia fracture ans was told by ER doctor that she would be getting a Hospital bed due to limited mobility.

## 2016-02-10 ENCOUNTER — Ambulatory Visit: Payer: Medicare Other | Admitting: Endocrinology

## 2016-02-11 ENCOUNTER — Other Ambulatory Visit: Payer: Self-pay | Admitting: Endocrinology

## 2016-02-11 ENCOUNTER — Ambulatory Visit: Payer: Medicare Other | Admitting: Endocrinology

## 2016-02-11 ENCOUNTER — Other Ambulatory Visit: Payer: Medicare Other

## 2016-02-11 NOTE — Telephone Encounter (Signed)
This was submitted on 02/10/2016 via [facility].

## 2016-02-11 NOTE — Telephone Encounter (Signed)
Patient stated Dr Dwyane Dee need to call [facility] concerning her Hospital Bed, they are waiting on his call.  [phone]  Patient ask you to give her a call please thank you.

## 2016-02-13 DIAGNOSIS — E785 Hyperlipidemia, unspecified: Secondary | ICD-10-CM | POA: Diagnosis not present

## 2016-02-13 DIAGNOSIS — M1712 Unilateral primary osteoarthritis, left knee: Secondary | ICD-10-CM | POA: Diagnosis not present

## 2016-02-13 DIAGNOSIS — E119 Type 2 diabetes mellitus without complications: Secondary | ICD-10-CM | POA: Diagnosis not present

## 2016-02-13 DIAGNOSIS — S82132D Displaced fracture of medial condyle of left tibia, subsequent encounter for closed fracture with routine healing: Secondary | ICD-10-CM | POA: Diagnosis not present

## 2016-02-13 DIAGNOSIS — K219 Gastro-esophageal reflux disease without esophagitis: Secondary | ICD-10-CM | POA: Diagnosis not present

## 2016-02-13 DIAGNOSIS — I10 Essential (primary) hypertension: Secondary | ICD-10-CM | POA: Diagnosis not present

## 2016-02-14 DIAGNOSIS — S82132D Displaced fracture of medial condyle of left tibia, subsequent encounter for closed fracture with routine healing: Secondary | ICD-10-CM | POA: Diagnosis not present

## 2016-02-14 DIAGNOSIS — E785 Hyperlipidemia, unspecified: Secondary | ICD-10-CM | POA: Diagnosis not present

## 2016-02-14 DIAGNOSIS — E119 Type 2 diabetes mellitus without complications: Secondary | ICD-10-CM | POA: Diagnosis not present

## 2016-02-14 DIAGNOSIS — K219 Gastro-esophageal reflux disease without esophagitis: Secondary | ICD-10-CM | POA: Diagnosis not present

## 2016-02-14 DIAGNOSIS — M1712 Unilateral primary osteoarthritis, left knee: Secondary | ICD-10-CM | POA: Diagnosis not present

## 2016-02-14 DIAGNOSIS — I10 Essential (primary) hypertension: Secondary | ICD-10-CM | POA: Diagnosis not present

## 2016-02-16 DIAGNOSIS — I10 Essential (primary) hypertension: Secondary | ICD-10-CM | POA: Diagnosis not present

## 2016-02-16 DIAGNOSIS — E119 Type 2 diabetes mellitus without complications: Secondary | ICD-10-CM | POA: Diagnosis not present

## 2016-02-16 DIAGNOSIS — K219 Gastro-esophageal reflux disease without esophagitis: Secondary | ICD-10-CM | POA: Diagnosis not present

## 2016-02-16 DIAGNOSIS — M1712 Unilateral primary osteoarthritis, left knee: Secondary | ICD-10-CM | POA: Diagnosis not present

## 2016-02-16 DIAGNOSIS — S82132D Displaced fracture of medial condyle of left tibia, subsequent encounter for closed fracture with routine healing: Secondary | ICD-10-CM | POA: Diagnosis not present

## 2016-02-16 DIAGNOSIS — E785 Hyperlipidemia, unspecified: Secondary | ICD-10-CM | POA: Diagnosis not present

## 2016-02-17 DIAGNOSIS — E119 Type 2 diabetes mellitus without complications: Secondary | ICD-10-CM | POA: Diagnosis not present

## 2016-02-17 DIAGNOSIS — S82145A Nondisplaced bicondylar fracture of left tibia, initial encounter for closed fracture: Secondary | ICD-10-CM | POA: Diagnosis not present

## 2016-02-17 DIAGNOSIS — S82132D Displaced fracture of medial condyle of left tibia, subsequent encounter for closed fracture with routine healing: Secondary | ICD-10-CM | POA: Diagnosis not present

## 2016-02-17 DIAGNOSIS — I10 Essential (primary) hypertension: Secondary | ICD-10-CM | POA: Diagnosis not present

## 2016-02-17 DIAGNOSIS — E785 Hyperlipidemia, unspecified: Secondary | ICD-10-CM | POA: Diagnosis not present

## 2016-02-17 DIAGNOSIS — M1711 Unilateral primary osteoarthritis, right knee: Secondary | ICD-10-CM | POA: Diagnosis not present

## 2016-02-17 DIAGNOSIS — K219 Gastro-esophageal reflux disease without esophagitis: Secondary | ICD-10-CM | POA: Diagnosis not present

## 2016-02-17 DIAGNOSIS — M1712 Unilateral primary osteoarthritis, left knee: Secondary | ICD-10-CM | POA: Diagnosis not present

## 2016-02-18 ENCOUNTER — Telehealth: Payer: Self-pay

## 2016-02-18 ENCOUNTER — Other Ambulatory Visit: Payer: Self-pay

## 2016-02-18 DIAGNOSIS — E119 Type 2 diabetes mellitus without complications: Secondary | ICD-10-CM | POA: Diagnosis not present

## 2016-02-18 DIAGNOSIS — I10 Essential (primary) hypertension: Secondary | ICD-10-CM | POA: Diagnosis not present

## 2016-02-18 DIAGNOSIS — E785 Hyperlipidemia, unspecified: Secondary | ICD-10-CM | POA: Diagnosis not present

## 2016-02-18 DIAGNOSIS — K219 Gastro-esophageal reflux disease without esophagitis: Secondary | ICD-10-CM | POA: Diagnosis not present

## 2016-02-18 DIAGNOSIS — M1712 Unilateral primary osteoarthritis, left knee: Secondary | ICD-10-CM | POA: Diagnosis not present

## 2016-02-18 DIAGNOSIS — S82132D Displaced fracture of medial condyle of left tibia, subsequent encounter for closed fracture with routine healing: Secondary | ICD-10-CM | POA: Diagnosis not present

## 2016-02-18 MED ORDER — LORAZEPAM 0.5 MG PO TABS: 0.5000 mg | ORAL_TABLET | Freq: Two times a day (BID) | ORAL | 0 refills | Status: DC | PRN

## 2016-02-18 NOTE — Telephone Encounter (Signed)
Patient fell and is having a lot of anxiety the bone doctor recommended she take something mild for this please advise

## 2016-02-18 NOTE — Telephone Encounter (Signed)
Lorazepam 0.5 mg twice a day as needed, 30 tablets no refills

## 2016-02-18 NOTE — Telephone Encounter (Signed)
Ordered and faxed to pharmacy 02/18/16

## 2016-02-19 DIAGNOSIS — K219 Gastro-esophageal reflux disease without esophagitis: Secondary | ICD-10-CM | POA: Diagnosis not present

## 2016-02-19 DIAGNOSIS — M1712 Unilateral primary osteoarthritis, left knee: Secondary | ICD-10-CM | POA: Diagnosis not present

## 2016-02-19 DIAGNOSIS — S82132D Displaced fracture of medial condyle of left tibia, subsequent encounter for closed fracture with routine healing: Secondary | ICD-10-CM | POA: Diagnosis not present

## 2016-02-19 DIAGNOSIS — E785 Hyperlipidemia, unspecified: Secondary | ICD-10-CM | POA: Diagnosis not present

## 2016-02-19 DIAGNOSIS — I10 Essential (primary) hypertension: Secondary | ICD-10-CM | POA: Diagnosis not present

## 2016-02-19 DIAGNOSIS — E119 Type 2 diabetes mellitus without complications: Secondary | ICD-10-CM | POA: Diagnosis not present

## 2016-02-20 DIAGNOSIS — M1712 Unilateral primary osteoarthritis, left knee: Secondary | ICD-10-CM | POA: Diagnosis not present

## 2016-02-20 DIAGNOSIS — E119 Type 2 diabetes mellitus without complications: Secondary | ICD-10-CM | POA: Diagnosis not present

## 2016-02-20 DIAGNOSIS — I10 Essential (primary) hypertension: Secondary | ICD-10-CM | POA: Diagnosis not present

## 2016-02-20 DIAGNOSIS — E785 Hyperlipidemia, unspecified: Secondary | ICD-10-CM | POA: Diagnosis not present

## 2016-02-20 DIAGNOSIS — K219 Gastro-esophageal reflux disease without esophagitis: Secondary | ICD-10-CM | POA: Diagnosis not present

## 2016-02-20 DIAGNOSIS — S82132D Displaced fracture of medial condyle of left tibia, subsequent encounter for closed fracture with routine healing: Secondary | ICD-10-CM | POA: Diagnosis not present

## 2016-02-23 DIAGNOSIS — M1712 Unilateral primary osteoarthritis, left knee: Secondary | ICD-10-CM | POA: Diagnosis not present

## 2016-02-23 DIAGNOSIS — E119 Type 2 diabetes mellitus without complications: Secondary | ICD-10-CM | POA: Diagnosis not present

## 2016-02-23 DIAGNOSIS — S82132D Displaced fracture of medial condyle of left tibia, subsequent encounter for closed fracture with routine healing: Secondary | ICD-10-CM | POA: Diagnosis not present

## 2016-02-23 DIAGNOSIS — E785 Hyperlipidemia, unspecified: Secondary | ICD-10-CM | POA: Diagnosis not present

## 2016-02-23 DIAGNOSIS — I10 Essential (primary) hypertension: Secondary | ICD-10-CM | POA: Diagnosis not present

## 2016-02-23 DIAGNOSIS — K219 Gastro-esophageal reflux disease without esophagitis: Secondary | ICD-10-CM | POA: Diagnosis not present

## 2016-02-24 DIAGNOSIS — E785 Hyperlipidemia, unspecified: Secondary | ICD-10-CM | POA: Diagnosis not present

## 2016-02-24 DIAGNOSIS — S82132D Displaced fracture of medial condyle of left tibia, subsequent encounter for closed fracture with routine healing: Secondary | ICD-10-CM | POA: Diagnosis not present

## 2016-02-24 DIAGNOSIS — M1712 Unilateral primary osteoarthritis, left knee: Secondary | ICD-10-CM | POA: Diagnosis not present

## 2016-02-24 DIAGNOSIS — E119 Type 2 diabetes mellitus without complications: Secondary | ICD-10-CM | POA: Diagnosis not present

## 2016-02-24 DIAGNOSIS — I10 Essential (primary) hypertension: Secondary | ICD-10-CM | POA: Diagnosis not present

## 2016-02-24 DIAGNOSIS — K219 Gastro-esophageal reflux disease without esophagitis: Secondary | ICD-10-CM | POA: Diagnosis not present

## 2016-02-25 DIAGNOSIS — E785 Hyperlipidemia, unspecified: Secondary | ICD-10-CM | POA: Diagnosis not present

## 2016-02-25 DIAGNOSIS — I10 Essential (primary) hypertension: Secondary | ICD-10-CM | POA: Diagnosis not present

## 2016-02-25 DIAGNOSIS — M1712 Unilateral primary osteoarthritis, left knee: Secondary | ICD-10-CM | POA: Diagnosis not present

## 2016-02-25 DIAGNOSIS — K219 Gastro-esophageal reflux disease without esophagitis: Secondary | ICD-10-CM | POA: Diagnosis not present

## 2016-02-25 DIAGNOSIS — E119 Type 2 diabetes mellitus without complications: Secondary | ICD-10-CM | POA: Diagnosis not present

## 2016-02-25 DIAGNOSIS — S82132D Displaced fracture of medial condyle of left tibia, subsequent encounter for closed fracture with routine healing: Secondary | ICD-10-CM | POA: Diagnosis not present

## 2016-02-26 DIAGNOSIS — M1712 Unilateral primary osteoarthritis, left knee: Secondary | ICD-10-CM | POA: Diagnosis not present

## 2016-02-26 DIAGNOSIS — K219 Gastro-esophageal reflux disease without esophagitis: Secondary | ICD-10-CM | POA: Diagnosis not present

## 2016-02-26 DIAGNOSIS — S82132D Displaced fracture of medial condyle of left tibia, subsequent encounter for closed fracture with routine healing: Secondary | ICD-10-CM | POA: Diagnosis not present

## 2016-02-26 DIAGNOSIS — I10 Essential (primary) hypertension: Secondary | ICD-10-CM | POA: Diagnosis not present

## 2016-02-26 DIAGNOSIS — E119 Type 2 diabetes mellitus without complications: Secondary | ICD-10-CM | POA: Diagnosis not present

## 2016-02-26 DIAGNOSIS — E785 Hyperlipidemia, unspecified: Secondary | ICD-10-CM | POA: Diagnosis not present

## 2016-02-27 DIAGNOSIS — E785 Hyperlipidemia, unspecified: Secondary | ICD-10-CM | POA: Diagnosis not present

## 2016-02-27 DIAGNOSIS — E119 Type 2 diabetes mellitus without complications: Secondary | ICD-10-CM | POA: Diagnosis not present

## 2016-02-27 DIAGNOSIS — S82132D Displaced fracture of medial condyle of left tibia, subsequent encounter for closed fracture with routine healing: Secondary | ICD-10-CM | POA: Diagnosis not present

## 2016-02-27 DIAGNOSIS — K219 Gastro-esophageal reflux disease without esophagitis: Secondary | ICD-10-CM | POA: Diagnosis not present

## 2016-02-27 DIAGNOSIS — I10 Essential (primary) hypertension: Secondary | ICD-10-CM | POA: Diagnosis not present

## 2016-02-27 DIAGNOSIS — M1712 Unilateral primary osteoarthritis, left knee: Secondary | ICD-10-CM | POA: Diagnosis not present

## 2016-03-02 DIAGNOSIS — K219 Gastro-esophageal reflux disease without esophagitis: Secondary | ICD-10-CM | POA: Diagnosis not present

## 2016-03-02 DIAGNOSIS — E119 Type 2 diabetes mellitus without complications: Secondary | ICD-10-CM | POA: Diagnosis not present

## 2016-03-02 DIAGNOSIS — S82132D Displaced fracture of medial condyle of left tibia, subsequent encounter for closed fracture with routine healing: Secondary | ICD-10-CM | POA: Diagnosis not present

## 2016-03-02 DIAGNOSIS — E785 Hyperlipidemia, unspecified: Secondary | ICD-10-CM | POA: Diagnosis not present

## 2016-03-02 DIAGNOSIS — I10 Essential (primary) hypertension: Secondary | ICD-10-CM | POA: Diagnosis not present

## 2016-03-02 DIAGNOSIS — M1712 Unilateral primary osteoarthritis, left knee: Secondary | ICD-10-CM | POA: Diagnosis not present

## 2016-03-03 ENCOUNTER — Other Ambulatory Visit: Payer: Self-pay

## 2016-03-03 ENCOUNTER — Other Ambulatory Visit: Payer: Self-pay | Admitting: Endocrinology

## 2016-03-03 DIAGNOSIS — K219 Gastro-esophageal reflux disease without esophagitis: Secondary | ICD-10-CM | POA: Diagnosis not present

## 2016-03-03 DIAGNOSIS — E119 Type 2 diabetes mellitus without complications: Secondary | ICD-10-CM | POA: Diagnosis not present

## 2016-03-03 DIAGNOSIS — E785 Hyperlipidemia, unspecified: Secondary | ICD-10-CM | POA: Diagnosis not present

## 2016-03-03 DIAGNOSIS — S82132D Displaced fracture of medial condyle of left tibia, subsequent encounter for closed fracture with routine healing: Secondary | ICD-10-CM | POA: Diagnosis not present

## 2016-03-03 DIAGNOSIS — M1712 Unilateral primary osteoarthritis, left knee: Secondary | ICD-10-CM | POA: Diagnosis not present

## 2016-03-03 DIAGNOSIS — I10 Essential (primary) hypertension: Secondary | ICD-10-CM | POA: Diagnosis not present

## 2016-03-03 MED ORDER — LORAZEPAM 0.5 MG PO TABS: 0.5000 mg | ORAL_TABLET | Freq: Two times a day (BID) | ORAL | 2 refills | Status: DC | PRN

## 2016-03-04 ENCOUNTER — Other Ambulatory Visit: Payer: Self-pay

## 2016-03-04 ENCOUNTER — Other Ambulatory Visit: Payer: Self-pay | Admitting: Endocrinology

## 2016-03-04 DIAGNOSIS — S82132D Displaced fracture of medial condyle of left tibia, subsequent encounter for closed fracture with routine healing: Secondary | ICD-10-CM | POA: Diagnosis not present

## 2016-03-04 DIAGNOSIS — K219 Gastro-esophageal reflux disease without esophagitis: Secondary | ICD-10-CM | POA: Diagnosis not present

## 2016-03-04 DIAGNOSIS — I10 Essential (primary) hypertension: Secondary | ICD-10-CM | POA: Diagnosis not present

## 2016-03-04 DIAGNOSIS — M1712 Unilateral primary osteoarthritis, left knee: Secondary | ICD-10-CM | POA: Diagnosis not present

## 2016-03-04 DIAGNOSIS — E119 Type 2 diabetes mellitus without complications: Secondary | ICD-10-CM | POA: Diagnosis not present

## 2016-03-04 DIAGNOSIS — E785 Hyperlipidemia, unspecified: Secondary | ICD-10-CM | POA: Diagnosis not present

## 2016-03-04 MED ORDER — LORAZEPAM 0.5 MG PO TABS: ORAL_TABLET | ORAL | 2 refills | Status: DC

## 2016-03-04 NOTE — Telephone Encounter (Signed)
Ordered 03/03/16

## 2016-03-05 ENCOUNTER — Telehealth: Payer: Self-pay | Admitting: Endocrinology

## 2016-03-05 DIAGNOSIS — E785 Hyperlipidemia, unspecified: Secondary | ICD-10-CM | POA: Diagnosis not present

## 2016-03-05 DIAGNOSIS — E119 Type 2 diabetes mellitus without complications: Secondary | ICD-10-CM | POA: Diagnosis not present

## 2016-03-05 DIAGNOSIS — S82132D Displaced fracture of medial condyle of left tibia, subsequent encounter for closed fracture with routine healing: Secondary | ICD-10-CM | POA: Diagnosis not present

## 2016-03-05 DIAGNOSIS — M1712 Unilateral primary osteoarthritis, left knee: Secondary | ICD-10-CM | POA: Diagnosis not present

## 2016-03-05 DIAGNOSIS — I10 Essential (primary) hypertension: Secondary | ICD-10-CM | POA: Diagnosis not present

## 2016-03-05 DIAGNOSIS — K219 Gastro-esophageal reflux disease without esophagitis: Secondary | ICD-10-CM | POA: Diagnosis not present

## 2016-03-05 NOTE — Telephone Encounter (Signed)
Patient stated she needed a OT to come help her,need a verbal order her name is Angela [name] [phone]   Please call her

## 2016-03-05 NOTE — Telephone Encounter (Signed)
Please disregard the message previously  Angela [name] at [facility] is stating she does not need OT she is at maximum functional level given her non-weight bearing status.  She does need private pay home help but not any type of services from [facility].

## 2016-03-05 NOTE — Telephone Encounter (Signed)
Noted  

## 2016-03-09 DIAGNOSIS — S82132D Displaced fracture of medial condyle of left tibia, subsequent encounter for closed fracture with routine healing: Secondary | ICD-10-CM | POA: Diagnosis not present

## 2016-03-09 DIAGNOSIS — E119 Type 2 diabetes mellitus without complications: Secondary | ICD-10-CM | POA: Diagnosis not present

## 2016-03-09 DIAGNOSIS — M1712 Unilateral primary osteoarthritis, left knee: Secondary | ICD-10-CM | POA: Diagnosis not present

## 2016-03-09 DIAGNOSIS — K219 Gastro-esophageal reflux disease without esophagitis: Secondary | ICD-10-CM | POA: Diagnosis not present

## 2016-03-09 DIAGNOSIS — E785 Hyperlipidemia, unspecified: Secondary | ICD-10-CM | POA: Diagnosis not present

## 2016-03-09 DIAGNOSIS — I10 Essential (primary) hypertension: Secondary | ICD-10-CM | POA: Diagnosis not present

## 2016-03-09 NOTE — Telephone Encounter (Signed)
Patient ask you to call her about getting someone to help her at home.  Please call and let her know, that she do not she do not need [facility] services.

## 2016-03-09 NOTE — Telephone Encounter (Signed)
Spoke with them today and they wanted to know if paper work had been faxed and I gave her date it was faxed

## 2016-03-10 DIAGNOSIS — I10 Essential (primary) hypertension: Secondary | ICD-10-CM | POA: Diagnosis not present

## 2016-03-10 DIAGNOSIS — E119 Type 2 diabetes mellitus without complications: Secondary | ICD-10-CM | POA: Diagnosis not present

## 2016-03-10 DIAGNOSIS — M1712 Unilateral primary osteoarthritis, left knee: Secondary | ICD-10-CM | POA: Diagnosis not present

## 2016-03-10 DIAGNOSIS — K219 Gastro-esophageal reflux disease without esophagitis: Secondary | ICD-10-CM | POA: Diagnosis not present

## 2016-03-10 DIAGNOSIS — E785 Hyperlipidemia, unspecified: Secondary | ICD-10-CM | POA: Diagnosis not present

## 2016-03-10 DIAGNOSIS — S82132D Displaced fracture of medial condyle of left tibia, subsequent encounter for closed fracture with routine healing: Secondary | ICD-10-CM | POA: Diagnosis not present

## 2016-03-11 DIAGNOSIS — M1712 Unilateral primary osteoarthritis, left knee: Secondary | ICD-10-CM | POA: Diagnosis not present

## 2016-03-11 DIAGNOSIS — E785 Hyperlipidemia, unspecified: Secondary | ICD-10-CM | POA: Diagnosis not present

## 2016-03-11 DIAGNOSIS — S82132D Displaced fracture of medial condyle of left tibia, subsequent encounter for closed fracture with routine healing: Secondary | ICD-10-CM | POA: Diagnosis not present

## 2016-03-11 DIAGNOSIS — K219 Gastro-esophageal reflux disease without esophagitis: Secondary | ICD-10-CM | POA: Diagnosis not present

## 2016-03-11 DIAGNOSIS — E119 Type 2 diabetes mellitus without complications: Secondary | ICD-10-CM | POA: Diagnosis not present

## 2016-03-11 DIAGNOSIS — I10 Essential (primary) hypertension: Secondary | ICD-10-CM | POA: Diagnosis not present

## 2016-03-16 ENCOUNTER — Other Ambulatory Visit: Payer: Self-pay | Admitting: Endocrinology

## 2016-03-16 DIAGNOSIS — K219 Gastro-esophageal reflux disease without esophagitis: Secondary | ICD-10-CM | POA: Diagnosis not present

## 2016-03-16 DIAGNOSIS — E785 Hyperlipidemia, unspecified: Secondary | ICD-10-CM | POA: Diagnosis not present

## 2016-03-16 DIAGNOSIS — M1712 Unilateral primary osteoarthritis, left knee: Secondary | ICD-10-CM | POA: Diagnosis not present

## 2016-03-16 DIAGNOSIS — S82132D Displaced fracture of medial condyle of left tibia, subsequent encounter for closed fracture with routine healing: Secondary | ICD-10-CM | POA: Diagnosis not present

## 2016-03-16 DIAGNOSIS — E119 Type 2 diabetes mellitus without complications: Secondary | ICD-10-CM | POA: Diagnosis not present

## 2016-03-16 DIAGNOSIS — I10 Essential (primary) hypertension: Secondary | ICD-10-CM | POA: Diagnosis not present

## 2016-03-17 DIAGNOSIS — K219 Gastro-esophageal reflux disease without esophagitis: Secondary | ICD-10-CM | POA: Diagnosis not present

## 2016-03-17 DIAGNOSIS — S82132D Displaced fracture of medial condyle of left tibia, subsequent encounter for closed fracture with routine healing: Secondary | ICD-10-CM | POA: Diagnosis not present

## 2016-03-17 DIAGNOSIS — I10 Essential (primary) hypertension: Secondary | ICD-10-CM | POA: Diagnosis not present

## 2016-03-17 DIAGNOSIS — E785 Hyperlipidemia, unspecified: Secondary | ICD-10-CM | POA: Diagnosis not present

## 2016-03-17 DIAGNOSIS — M1712 Unilateral primary osteoarthritis, left knee: Secondary | ICD-10-CM | POA: Diagnosis not present

## 2016-03-17 DIAGNOSIS — E119 Type 2 diabetes mellitus without complications: Secondary | ICD-10-CM | POA: Diagnosis not present

## 2016-03-18 ENCOUNTER — Emergency Department (HOSPITAL_COMMUNITY)
Admission: EM | Admit: 2016-03-18 | Discharge: 2016-03-18 | Disposition: A | Discharge status: Home / Self Care | Payer: Medicare Other | Attending: Emergency Medicine | Admitting: Emergency Medicine

## 2016-03-18 ENCOUNTER — Emergency Department (HOSPITAL_COMMUNITY): Payer: Medicare Other

## 2016-03-18 ENCOUNTER — Encounter (HOSPITAL_COMMUNITY): Payer: Self-pay | Admitting: Emergency Medicine

## 2016-03-18 DIAGNOSIS — Z7901 Long term (current) use of anticoagulants: Secondary | ICD-10-CM | POA: Diagnosis not present

## 2016-03-18 DIAGNOSIS — S82132D Displaced fracture of medial condyle of left tibia, subsequent encounter for closed fracture with routine healing: Secondary | ICD-10-CM | POA: Diagnosis not present

## 2016-03-18 DIAGNOSIS — Z79899 Other long term (current) drug therapy: Secondary | ICD-10-CM | POA: Diagnosis not present

## 2016-03-18 DIAGNOSIS — J189 Pneumonia, unspecified organism: Secondary | ICD-10-CM | POA: Diagnosis not present

## 2016-03-18 DIAGNOSIS — M1712 Unilateral primary osteoarthritis, left knee: Secondary | ICD-10-CM | POA: Diagnosis not present

## 2016-03-18 DIAGNOSIS — E119 Type 2 diabetes mellitus without complications: Secondary | ICD-10-CM | POA: Insufficient documentation

## 2016-03-18 DIAGNOSIS — R0602 Shortness of breath: Secondary | ICD-10-CM

## 2016-03-18 DIAGNOSIS — Z853 Personal history of malignant neoplasm of breast: Secondary | ICD-10-CM | POA: Diagnosis not present

## 2016-03-18 DIAGNOSIS — I10 Essential (primary) hypertension: Secondary | ICD-10-CM | POA: Insufficient documentation

## 2016-03-18 DIAGNOSIS — E039 Hypothyroidism, unspecified: Secondary | ICD-10-CM | POA: Insufficient documentation

## 2016-03-18 DIAGNOSIS — K219 Gastro-esophageal reflux disease without esophagitis: Secondary | ICD-10-CM | POA: Diagnosis not present

## 2016-03-18 DIAGNOSIS — E785 Hyperlipidemia, unspecified: Secondary | ICD-10-CM | POA: Diagnosis not present

## 2016-03-18 LAB — CBC WITH DIFFERENTIAL/PLATELET
Basophils Absolute: 0 10*3/uL (ref 0.0–0.1)
Basophils Relative: 0 %
EOS ABS: 0.1 10*3/uL (ref 0.0–0.7)
EOS PCT: 1 %
HCT: 41.3 % (ref 36.0–46.0)
Hemoglobin: 14.3 g/dL (ref 12.0–15.0)
LYMPHS ABS: 2.5 10*3/uL (ref 0.7–4.0)
LYMPHS PCT: 23 %
MCH: 29.5 pg (ref 26.0–34.0)
MCHC: 34.6 g/dL (ref 30.0–36.0)
MCV: 85.2 fL (ref 78.0–100.0)
MONO ABS: 0.9 10*3/uL (ref 0.1–1.0)
Monocytes Relative: 9 %
Neutro Abs: 7.3 10*3/uL (ref 1.7–7.7)
Neutrophils Relative %: 67 %
PLATELETS: 239 10*3/uL (ref 150–400)
RBC: 4.85 MIL/uL (ref 3.87–5.11)
RDW: 13.1 % (ref 11.5–15.5)
WBC: 10.9 10*3/uL — AB (ref 4.0–10.5)

## 2016-03-18 LAB — BASIC METABOLIC PANEL
Anion gap: 12 (ref 5–15)
BUN: 7 mg/dL (ref 6–20)
CALCIUM: 9.5 mg/dL (ref 8.9–10.3)
CO2: 29 mmol/L (ref 22–32)
CREATININE: 0.51 mg/dL (ref 0.44–1.00)
Chloride: 101 mmol/L (ref 101–111)
GFR calc Af Amer: >60 mL/min (ref >60)
GLUCOSE: 137 mg/dL — AB (ref 65–99)
Potassium: 3 mmol/L — ABNORMAL LOW (ref 3.5–5.1)
SODIUM: 142 mmol/L (ref 135–145)

## 2016-03-18 LAB — I-STAT TROPONIN, ED: TROPONIN I, POC: 0 ng/mL (ref 0.00–0.08)

## 2016-03-18 LAB — BRAIN NATRIURETIC PEPTIDE: B Natriuretic Peptide: 49.9 pg/mL (ref 0.0–100.0)

## 2016-03-18 MED ORDER — AZITHROMYCIN 250 MG PO TABS: 250.0000 mg | ORAL_TABLET | Freq: Every day | ORAL | 0 refills | Status: DC

## 2016-03-18 MED ORDER — POTASSIUM CHLORIDE CRYS ER 20 MEQ PO TBCR
40.0000 meq | EXTENDED_RELEASE_TABLET | Freq: Once | ORAL | Status: AC
  Administered 2016-03-18: 40 meq via ORAL
  Filled 2016-03-18: qty 2

## 2016-03-18 NOTE — ED Triage Notes (Addendum)
Pt presents to ED via [facility] with c/o SOB x2 days with sinus drainage. Pt also reported anxiety tonight and taken ativan prior to arrival to ED. Per EMS BP-136/72, SpO2-98% on room air, HR-80. Pt denies chest pain.

## 2016-03-18 NOTE — ED Notes (Signed)
Bed: [facility] Expected date:  Expected time:  Means of arrival:  Comments: Shortness of breath

## 2016-03-18 NOTE — ED Provider Notes (Signed)
[facility] DEPT Provider Note   CSN: [id number] Arrival date & time: 03/18/16  0034  By signing my name below, I, Judithe Modest, attest that this documentation has been prepared under the direction and in the presence of Sherwood Gambler, MD. Electronically Signed: Judithe Modest, ER Scribe. 11/01/2015. 1:31 AM.  History   Chief Complaint Chief Complaint  Patient presents with  . Shortness of Breath   HPI  HPI Comments: Angela [name] is a 82 y.o. female who presents to the Emergency Department complaining of two days of SOB, worse at night with associated sleep disturbance. She states it is easier to breath out of her mouth than her nose but denies sinus congestion. She denies CP, cough or leg swelling. She is on eliquis due to a PMHx of blood clots. She has been confined to a hospital bed for one month due to a past fracture of left tibia. She takes ativan for anxiety and took one pill three hours ago. She has only taken one of her eliquis today. Dyspnea is constant and not worse today but it scared her so she came in to the ED. She reports she thinks anxiety is making it worse   Past Medical History:  Diagnosis Date  . Arthritis   . BRCA2 positive 10/214  . Breast cancer ([place]) 1994   left sided cancer; unilateral mastectomy, no chemo or radiation  . Diabetes mellitus   . Dyslipidemia   . GERD (gastroesophageal reflux disease)   . Heart murmur   . Hypertension     Patient Active Problem List   Diagnosis Date Noted  . Ascending aortic aneurysm ([place]) 01/13/2016  . Thoracic aortic atherosclerosis ([place]) 01/13/2016  . Pulmonary embolism without acute cor pulmonale ([place])   . Pulmonary emboli ([place]) 08/30/2015  . Acute massive pulmonary embolism ([place]) 08/30/2015  . Leukocytosis 12/23/2014  . BRCA2 positive   . Hypothyroidism (acquired) 12/04/2012  . Heart murmur 09/01/2012  . Chest pain 09/17/2010  . Hypertension   . Dyslipidemia   . Type II or unspecified type  diabetes mellitus without mention of complication, not stated as uncontrolled   . Arthritis   . GERD (gastroesophageal reflux disease)     Past Surgical History:  Procedure Laterality Date  . CARDIAC CATHETERIZATION     NORMAL CORONARY ARTERIES  . CHOLECYSTECTOMY    . MASTECTOMY     LEFT BREAST  . TONSILLECTOMY      OB History    No data available       Home Medications    Prior to Admission medications   Medication Sig Start Date End Date Taking? Authorizing Provider  azithromycin (ZITHROMAX) 250 MG tablet Take 1 tablet (250 mg total) by mouth daily. Take first 2 tablets together, then 1 every day until finished. 03/18/16   Sherwood Gambler, MD  benazepril-hydrochlorthiazide (LOTENSIN HCT) 20-12.5 MG tablet TAKE 1 TABLET ONCE DAILY. 03/16/16   [name], MD  chlorpheniramine-HYDROcodone (TUSSIONEX PENNKINETIC ER) 10-8 MG/5ML SUER Take 1 teaspoon 2 times daily as needed for cough Patient not taking: Reported on 02/02/2016 01/12/16   [name], MD  ELIQUIS 5 MG TABS tablet TAKE 1 TABLET TWICE DAILY. Patient taking differently: TAKE 75m TABLET by mouth TWICE DAILY. 11/20/15   AElayne Snare MD  furosemide (LASIX) 20 MG tablet Take 1 tablet (20 mg total) by mouth daily as needed for edema (if weight high by 2 pounds in 24hrs, weigh yourself daily). 09/02/15   PThurnell Lose MD  isosorbide mononitrate (IMDUR) 30 MG 24 hr tablet TAKE 1 TABLET ONCE DAILY. Patient taking differently: TAKE 53m TABLET by mouth ONCE DAILY. 11/20/15   AElayne Snare MD  LORazepam (ATIVAN) 0.5 MG tablet TAKE (1) TABLET TWICE DAILY AS NEEDED FOR ANXIETY. 03/04/16   AElayne Snare MD  pantoprazole (PROTONIX) 40 MG tablet TAKE 1 TABLET ONCE DAILY. 03/16/16   AElayne Snare MD  PERCOCET 10-325 MG per tablet Take 0.25-1 tablets by mouth every 6 (six) hours as needed for pain.  02/19/14   Historical Provider, MD  polyethylene glycol (MIRALAX / GLYCOLAX) packet Take 17 g by mouth 3 (three) times daily as needed. Patient  taking differently: Take 17 g by mouth 3 (three) times daily as needed for moderate constipation.  05/26/12   DDrenda Freeze MD  SYNTHROID 100 MCG tablet TAKE 1 TABLET EACH DAY. 03/03/16   AElayne Snare MD  Vitamin D, Ergocalciferol, (DRISDOL) 50000 units CAPS capsule TAKE ONE CAPSULE ONCE A WEEK. Patient taking differently: TAKE 517001units by mouth ONCE A WEEK on thursdays 11/20/15   AElayne Snare MD  VOLTAREN 1 % GEL Apply 2 g topically 4 (four) times daily as needed (pain). To knees 02/06/14   Historical Provider, MD    Family History Family History  Problem Relation Age of Onset  . Heart attack Maternal Grandmother   . Pancreatic cancer Mother 643 . Bladder Cancer Father 766   heavy smoker and drinker  . Ovarian cancer Sister 511 . Hypertension Sister   . Lung cancer Brother 532 . Ovarian cancer Maternal Aunt 62  . Stomach cancer Maternal Grandfather   . Bone cancer Paternal Grandmother     unsure if this was a primary cancer  . Stomach cancer Paternal Grandfather   . Lung cancer Maternal Aunt     died in her 848s former smoker  . Breast cancer Cousin     dx in her late 472sto 560s paternal cousin    Social History Social History  Substance Use Topics  . Smoking status: Never Smoker  . Smokeless tobacco: Never Used  . Alcohol use No     Allergies   Gadolinium; Iodinated diagnostic agents; Pravastatin; Simvastatin; Crestor [rosuvastatin calcium]; and Penicillins   Review of Systems Review of Systems  Constitutional: Negative for fever.  Respiratory: Positive for shortness of breath. Negative for cough.   Cardiovascular: Negative for chest pain and leg swelling.  All other systems reviewed and are negative.   Physical Exam Updated Vital Signs BP 134/78   Pulse 94   Temp 98.4 F (36.9 C) (Oral)   Resp 16   Ht _0  (1.727 m)   Wt 210 lb (95.3 kg)   SpO2 96%   BMI 31.93 kg/m   Physical Exam  Constitutional: She is oriented to person, place, and time. She  appears well-developed and well-nourished.  HENT:  Head: Normocephalic and atraumatic.  Right Ear: External ear normal.  Left Ear: External ear normal.  Nose: Nose normal.  Eyes: Right eye exhibits no discharge. Left eye exhibits no discharge.  Cardiovascular: Normal rate, regular rhythm and normal heart sounds.   Pulmonary/Chest: Effort normal and breath sounds normal. She has no wheezes. She has no rales.  Abdominal: Soft. There is no tenderness.  Musculoskeletal: She exhibits no edema.  Neurological: She is alert and oriented to person, place, and time.  Skin: Skin is warm and dry.  Psychiatric: Her mood appears anxious.  Nursing note and vitals reviewed.  ED Treatments / Results  DIAGNOSTIC STUDIES: Oxygen Saturation is 96% on RA, normal by my interpretation.    COORDINATION OF CARE: 1:31 AM Discussed treatment plan with pt at bedside and pt agreed to plan.  Labs (all labs ordered are listed, but only abnormal results are displayed) Labs Reviewed  CBC WITH DIFFERENTIAL/PLATELET - Abnormal; Notable for the following:       Result Value   WBC 10.9 (*)    All other components within normal limits  BASIC METABOLIC PANEL - Abnormal; Notable for the following:    Potassium 3.0 (*)    Glucose, Bld 137 (*)    All other components within normal limits  BRAIN NATRIURETIC PEPTIDE  I-STAT TROPOININ, ED    EKG  EKG Interpretation  Date/Time:  Thursday March 18 2016 00:49:32 EST Ventricular Rate:  95 PR Interval:    QRS Duration: 88 QT Interval:  377 QTC Calculation: 474 R Axis:   -49 Text Interpretation:  Normal sinus rhythm Left anterior fascicular block Low voltage, precordial leads Consider anterior infarct no significant change since June 2017 Confirmed by Regenia Skeeter MD, Maesyn Frisinger [phone]) on 03/18/2016 1:36:17 AM       Radiology Dg Chest 2 View  Result Date: 03/18/2016 CLINICAL DATA:  Acute onset of shortness of breath and anxiety. Initial encounter. EXAM: CHEST   2 VIEW COMPARISON:  Chest radiograph performed 09/09/2015 FINDINGS: There is elevation of the right hemidiaphragm. Mild bibasilar opacities may reflect atelectasis or possibly mild infection. No pleural effusion or pneumothorax is seen. The cardiomediastinal silhouette is normal in size. No acute osseous abnormalities are seen. There is chronic compression deformity at the lower thoracic spine, with associated changes of vertebroplasty. IMPRESSION: Elevation of the right hemidiaphragm. Mild bibasilar opacities may reflect atelectasis or possibly mild infection. Electronically Signed   By: Garald Balding M.D.   On: 03/18/2016 01:24    Procedures Procedures (including critical care time)  Medications Ordered in ED Medications  potassium chloride SA (K-DUR,KLOR-CON) CR tablet 40 mEq (40 mEq Oral Given 03/18/16 0228)     Initial Impression / Assessment and Plan / ED Course  I have reviewed the triage vital signs and the nursing notes.  Pertinent labs & imaging results that were available during my care of the patient were reviewed by me and considered in my medical decision making (see chart for details).  Clinical Course as of Mar 18 514  Thu Mar 18, 2016  0131 Patient overall appears quite well. Saturating between 96 and 100% on room air. No chest pain. Given that she is breathing through her mouth more I think this is probably mostly nasal congestion as this is where she points to where her "shortness of breath" is. My suspicion for pulmonary embolism is very low and given no hypoxia or tachycardia I do not think workup is needed as she is already on Eliquis. There may be an anxiety component too but probably started as a URI/nasal congestion.  [SG]  0259 X-ray with questionable pneumonia. Given her complaint of short of breath with some congestion and mild cough, will treat with azithromycin for atypical pneumonia. She is overall well appearing with no increased work of breathing, hypoxia. She  has mild hypokalemia. She states she used to be on potassium but stopped it on her own. Encouraged to take this at home.  [SG]    Clinical Course User Index [SG] Sherwood Gambler, MD    Is overall well appearing. No signs of CHF. Highly doubt PE, ACS.  Likely viral URI but with possible mild PNA on CXR, will give azithromycin. Will take her potassium she still has at home. Took her night dose of Eliquis here, no true missed doses. Strict return precautions.  Final Clinical Impressions(s) / ED Diagnoses   Final diagnoses:  SOB (shortness of breath)  Community acquired pneumonia, unspecified laterality    New Prescriptions Discharge Medication List as of 03/18/2016  2:59 AM      I personally performed the services described in this documentation, which was scribed in my presence. The recorded information has been reviewed and is accurate.        Sherwood Gambler, MD 03/18/16 [phone]

## 2016-03-18 NOTE — ED Notes (Signed)
Pt. In xray 

## 2016-03-22 DIAGNOSIS — S82132D Displaced fracture of medial condyle of left tibia, subsequent encounter for closed fracture with routine healing: Secondary | ICD-10-CM | POA: Diagnosis not present

## 2016-03-22 DIAGNOSIS — E119 Type 2 diabetes mellitus without complications: Secondary | ICD-10-CM | POA: Diagnosis not present

## 2016-03-22 DIAGNOSIS — I10 Essential (primary) hypertension: Secondary | ICD-10-CM | POA: Diagnosis not present

## 2016-03-22 DIAGNOSIS — E785 Hyperlipidemia, unspecified: Secondary | ICD-10-CM | POA: Diagnosis not present

## 2016-03-22 DIAGNOSIS — K219 Gastro-esophageal reflux disease without esophagitis: Secondary | ICD-10-CM | POA: Diagnosis not present

## 2016-03-22 DIAGNOSIS — M1712 Unilateral primary osteoarthritis, left knee: Secondary | ICD-10-CM | POA: Diagnosis not present

## 2016-03-23 DIAGNOSIS — K219 Gastro-esophageal reflux disease without esophagitis: Secondary | ICD-10-CM | POA: Diagnosis not present

## 2016-03-23 DIAGNOSIS — E785 Hyperlipidemia, unspecified: Secondary | ICD-10-CM | POA: Diagnosis not present

## 2016-03-23 DIAGNOSIS — E119 Type 2 diabetes mellitus without complications: Secondary | ICD-10-CM | POA: Diagnosis not present

## 2016-03-23 DIAGNOSIS — S82132D Displaced fracture of medial condyle of left tibia, subsequent encounter for closed fracture with routine healing: Secondary | ICD-10-CM | POA: Diagnosis not present

## 2016-03-23 DIAGNOSIS — M1712 Unilateral primary osteoarthritis, left knee: Secondary | ICD-10-CM | POA: Diagnosis not present

## 2016-03-23 DIAGNOSIS — I10 Essential (primary) hypertension: Secondary | ICD-10-CM | POA: Diagnosis not present

## 2016-03-25 ENCOUNTER — Other Ambulatory Visit: Payer: Self-pay

## 2016-03-25 DIAGNOSIS — K219 Gastro-esophageal reflux disease without esophagitis: Secondary | ICD-10-CM | POA: Diagnosis not present

## 2016-03-25 DIAGNOSIS — S82132D Displaced fracture of medial condyle of left tibia, subsequent encounter for closed fracture with routine healing: Secondary | ICD-10-CM | POA: Diagnosis not present

## 2016-03-25 DIAGNOSIS — E119 Type 2 diabetes mellitus without complications: Secondary | ICD-10-CM | POA: Diagnosis not present

## 2016-03-25 DIAGNOSIS — E785 Hyperlipidemia, unspecified: Secondary | ICD-10-CM | POA: Diagnosis not present

## 2016-03-25 DIAGNOSIS — I10 Essential (primary) hypertension: Secondary | ICD-10-CM | POA: Diagnosis not present

## 2016-03-25 DIAGNOSIS — M1712 Unilateral primary osteoarthritis, left knee: Secondary | ICD-10-CM | POA: Diagnosis not present

## 2016-03-25 MED ORDER — CIPROFLOXACIN HCL 500 MG PO TABS: 500.0000 mg | ORAL_TABLET | Freq: Two times a day (BID) | ORAL | 0 refills | Status: DC

## 2016-03-25 NOTE — Telephone Encounter (Signed)
Refill submitted. 

## 2016-03-25 NOTE — Telephone Encounter (Signed)
Pt has the nurse at the home and she is stating she thinks the pt has a UTI, can we call something  Pt is experiencing a smell, burns with she uses the restroom, and is having a hard time voiding.

## 2016-03-25 NOTE — Telephone Encounter (Signed)
Cipro 500 mg twice a day, 10 tablets 

## 2016-03-25 NOTE — Telephone Encounter (Signed)
Pharmacy haven't received prescription for Cipro, please resubmit  [place], [place] [phone] (Phone) [phone] (Fax)

## 2016-03-26 ENCOUNTER — Other Ambulatory Visit: Payer: Self-pay | Admitting: Endocrinology

## 2016-03-27 ENCOUNTER — Other Ambulatory Visit: Payer: Self-pay | Admitting: Endocrinology

## 2016-03-29 NOTE — Telephone Encounter (Signed)
Patient is requesting to have her LORazepam (ATIVAN) 0.5 MG tablet filled today, so she can have someone pick it up for her.  Send to: [place], [place]

## 2016-03-29 NOTE — Telephone Encounter (Signed)
Pt called in requesting a prescription for Lorazepam to be sent to [facility].

## 2016-03-30 DIAGNOSIS — E785 Hyperlipidemia, unspecified: Secondary | ICD-10-CM | POA: Diagnosis not present

## 2016-03-30 DIAGNOSIS — E119 Type 2 diabetes mellitus without complications: Secondary | ICD-10-CM | POA: Diagnosis not present

## 2016-03-30 DIAGNOSIS — M1712 Unilateral primary osteoarthritis, left knee: Secondary | ICD-10-CM | POA: Diagnosis not present

## 2016-03-30 DIAGNOSIS — M170 Bilateral primary osteoarthritis of knee: Secondary | ICD-10-CM | POA: Diagnosis not present

## 2016-03-30 DIAGNOSIS — I10 Essential (primary) hypertension: Secondary | ICD-10-CM | POA: Diagnosis not present

## 2016-03-30 DIAGNOSIS — S82132D Displaced fracture of medial condyle of left tibia, subsequent encounter for closed fracture with routine healing: Secondary | ICD-10-CM | POA: Diagnosis not present

## 2016-03-30 DIAGNOSIS — K219 Gastro-esophageal reflux disease without esophagitis: Secondary | ICD-10-CM | POA: Diagnosis not present

## 2016-03-30 NOTE — Telephone Encounter (Signed)
Faxed 03/29/16

## 2016-03-30 NOTE — Telephone Encounter (Signed)
This has been handled.

## 2016-04-02 ENCOUNTER — Other Ambulatory Visit (INDEPENDENT_AMBULATORY_CARE_PROVIDER_SITE_OTHER): Payer: Medicare Other

## 2016-04-02 ENCOUNTER — Telehealth: Payer: Self-pay | Admitting: Endocrinology

## 2016-04-02 ENCOUNTER — Other Ambulatory Visit: Payer: Self-pay

## 2016-04-02 DIAGNOSIS — K219 Gastro-esophageal reflux disease without esophagitis: Secondary | ICD-10-CM | POA: Diagnosis not present

## 2016-04-02 DIAGNOSIS — N390 Urinary tract infection, site not specified: Secondary | ICD-10-CM

## 2016-04-02 DIAGNOSIS — I10 Essential (primary) hypertension: Secondary | ICD-10-CM | POA: Diagnosis not present

## 2016-04-02 DIAGNOSIS — E119 Type 2 diabetes mellitus without complications: Secondary | ICD-10-CM | POA: Diagnosis not present

## 2016-04-02 DIAGNOSIS — S82132D Displaced fracture of medial condyle of left tibia, subsequent encounter for closed fracture with routine healing: Secondary | ICD-10-CM | POA: Diagnosis not present

## 2016-04-02 DIAGNOSIS — E785 Hyperlipidemia, unspecified: Secondary | ICD-10-CM | POA: Diagnosis not present

## 2016-04-02 DIAGNOSIS — M1712 Unilateral primary osteoarthritis, left knee: Secondary | ICD-10-CM | POA: Diagnosis not present

## 2016-04-02 LAB — POCT URINALYSIS DIPSTICK
BILIRUBIN UA: NEGATIVE
Blood, UA: NEGATIVE
Glucose, UA: NEGATIVE
KETONES UA: NEGATIVE
Nitrite, UA: NEGATIVE
SPEC GRAV UA: 1.02
Urobilinogen, UA: 0.2
pH, UA: 6

## 2016-04-02 LAB — URINALYSIS
Bilirubin Urine: NEGATIVE
Hgb urine dipstick: NEGATIVE
Ketones, ur: NEGATIVE
LEUKOCYTES UA: NEGATIVE
Nitrite: NEGATIVE
PH: 6 (ref 5.0–8.0)
SPECIFIC GRAVITY, URINE: 1.02 (ref 1.000–1.030)
TOTAL PROTEIN, URINE-UPE24: NEGATIVE
Urine Glucose: NEGATIVE
Urobilinogen, UA: 0.2 (ref 0.0–1.0)

## 2016-04-02 NOTE — Progress Notes (Signed)
Please let patient know that the lab result is normal and no further action needed

## 2016-04-02 NOTE — Telephone Encounter (Signed)
Patient is calling for her urine results

## 2016-04-02 NOTE — Telephone Encounter (Signed)
Patient needs something to send a specimen to Dr. Dwyane Dee.  She will be sending someone Bryna Colander) to the office to pick it up because she is on a walker and can't get out of the house.

## 2016-04-02 NOTE — Telephone Encounter (Signed)
Called and let patient know lab results were normal- she stated an understanding

## 2016-04-05 ENCOUNTER — Telehealth: Payer: Self-pay | Admitting: Endocrinology

## 2016-04-05 DIAGNOSIS — M1712 Unilateral primary osteoarthritis, left knee: Secondary | ICD-10-CM | POA: Diagnosis not present

## 2016-04-05 DIAGNOSIS — K219 Gastro-esophageal reflux disease without esophagitis: Secondary | ICD-10-CM | POA: Diagnosis not present

## 2016-04-05 DIAGNOSIS — I10 Essential (primary) hypertension: Secondary | ICD-10-CM | POA: Diagnosis not present

## 2016-04-05 DIAGNOSIS — E785 Hyperlipidemia, unspecified: Secondary | ICD-10-CM | POA: Diagnosis not present

## 2016-04-05 DIAGNOSIS — E119 Type 2 diabetes mellitus without complications: Secondary | ICD-10-CM | POA: Diagnosis not present

## 2016-04-05 DIAGNOSIS — S82132D Displaced fracture of medial condyle of left tibia, subsequent encounter for closed fracture with routine healing: Secondary | ICD-10-CM | POA: Diagnosis not present

## 2016-04-05 NOTE — Telephone Encounter (Signed)
Patient ask you to give her a call °

## 2016-04-07 DIAGNOSIS — E785 Hyperlipidemia, unspecified: Secondary | ICD-10-CM | POA: Diagnosis not present

## 2016-04-07 DIAGNOSIS — J189 Pneumonia, unspecified organism: Secondary | ICD-10-CM | POA: Diagnosis not present

## 2016-04-07 DIAGNOSIS — Z9181 History of falling: Secondary | ICD-10-CM | POA: Diagnosis not present

## 2016-04-07 DIAGNOSIS — I10 Essential (primary) hypertension: Secondary | ICD-10-CM | POA: Diagnosis not present

## 2016-04-07 DIAGNOSIS — K219 Gastro-esophageal reflux disease without esophagitis: Secondary | ICD-10-CM | POA: Diagnosis not present

## 2016-04-07 DIAGNOSIS — E039 Hypothyroidism, unspecified: Secondary | ICD-10-CM | POA: Diagnosis not present

## 2016-04-07 DIAGNOSIS — Z853 Personal history of malignant neoplasm of breast: Secondary | ICD-10-CM | POA: Diagnosis not present

## 2016-04-07 DIAGNOSIS — M1712 Unilateral primary osteoarthritis, left knee: Secondary | ICD-10-CM | POA: Diagnosis not present

## 2016-04-07 DIAGNOSIS — Z1501 Genetic susceptibility to malignant neoplasm of breast: Secondary | ICD-10-CM | POA: Diagnosis not present

## 2016-04-07 DIAGNOSIS — Z86711 Personal history of pulmonary embolism: Secondary | ICD-10-CM | POA: Diagnosis not present

## 2016-04-07 DIAGNOSIS — Z9012 Acquired absence of left breast and nipple: Secondary | ICD-10-CM | POA: Diagnosis not present

## 2016-04-07 DIAGNOSIS — S82132D Displaced fracture of medial condyle of left tibia, subsequent encounter for closed fracture with routine healing: Secondary | ICD-10-CM | POA: Diagnosis not present

## 2016-04-07 DIAGNOSIS — E119 Type 2 diabetes mellitus without complications: Secondary | ICD-10-CM | POA: Diagnosis not present

## 2016-04-08 DIAGNOSIS — E119 Type 2 diabetes mellitus without complications: Secondary | ICD-10-CM | POA: Diagnosis not present

## 2016-04-08 DIAGNOSIS — I10 Essential (primary) hypertension: Secondary | ICD-10-CM | POA: Diagnosis not present

## 2016-04-08 DIAGNOSIS — S82132D Displaced fracture of medial condyle of left tibia, subsequent encounter for closed fracture with routine healing: Secondary | ICD-10-CM | POA: Diagnosis not present

## 2016-04-08 DIAGNOSIS — J189 Pneumonia, unspecified organism: Secondary | ICD-10-CM | POA: Diagnosis not present

## 2016-04-08 DIAGNOSIS — M1712 Unilateral primary osteoarthritis, left knee: Secondary | ICD-10-CM | POA: Diagnosis not present

## 2016-04-08 DIAGNOSIS — K219 Gastro-esophageal reflux disease without esophagitis: Secondary | ICD-10-CM | POA: Diagnosis not present

## 2016-04-09 NOTE — Telephone Encounter (Signed)
Spoke with patient and she wanted to make an appointment so I transferred call to [place]

## 2016-04-12 DIAGNOSIS — E119 Type 2 diabetes mellitus without complications: Secondary | ICD-10-CM | POA: Diagnosis not present

## 2016-04-12 DIAGNOSIS — M1712 Unilateral primary osteoarthritis, left knee: Secondary | ICD-10-CM | POA: Diagnosis not present

## 2016-04-12 DIAGNOSIS — J189 Pneumonia, unspecified organism: Secondary | ICD-10-CM | POA: Diagnosis not present

## 2016-04-12 DIAGNOSIS — K219 Gastro-esophageal reflux disease without esophagitis: Secondary | ICD-10-CM | POA: Diagnosis not present

## 2016-04-12 DIAGNOSIS — I10 Essential (primary) hypertension: Secondary | ICD-10-CM | POA: Diagnosis not present

## 2016-04-12 DIAGNOSIS — S82132D Displaced fracture of medial condyle of left tibia, subsequent encounter for closed fracture with routine healing: Secondary | ICD-10-CM | POA: Diagnosis not present

## 2016-04-13 ENCOUNTER — Other Ambulatory Visit (INDEPENDENT_AMBULATORY_CARE_PROVIDER_SITE_OTHER): Payer: Medicare Other

## 2016-04-13 DIAGNOSIS — R7303 Prediabetes: Secondary | ICD-10-CM | POA: Diagnosis not present

## 2016-04-13 DIAGNOSIS — E039 Hypothyroidism, unspecified: Secondary | ICD-10-CM

## 2016-04-13 DIAGNOSIS — E785 Hyperlipidemia, unspecified: Secondary | ICD-10-CM | POA: Diagnosis not present

## 2016-04-13 LAB — COMPREHENSIVE METABOLIC PANEL
ALT: 14 U/L (ref 0–35)
AST: 15 U/L (ref 0–37)
Albumin: 4.1 g/dL (ref 3.5–5.2)
Alkaline Phosphatase: 106 U/L (ref 39–117)
BUN: 15 mg/dL (ref 6–23)
CO2: 33 meq/L — AB (ref 19–32)
Calcium: 9.4 mg/dL (ref 8.4–10.5)
Chloride: 97 mEq/L (ref 96–112)
Creatinine, Ser: 0.58 mg/dL (ref 0.40–1.20)
GFR: 105.82 mL/min (ref >60.00)
GLUCOSE: 113 mg/dL — AB (ref 70–99)
POTASSIUM: 3.9 meq/L (ref 3.5–5.1)
SODIUM: 136 meq/L (ref 135–145)
Total Bilirubin: 0.8 mg/dL (ref 0.2–1.2)
Total Protein: 7 g/dL (ref 6.0–8.3)

## 2016-04-13 LAB — LIPID PANEL
CHOL/HDL RATIO: 4
Cholesterol: 235 mg/dL — ABNORMAL HIGH (ref 0–200)
HDL: 57.9 mg/dL (ref >39.00)
LDL CALC: 159 mg/dL — AB (ref 0–99)
NONHDL: 177.13
Triglycerides: 93 mg/dL (ref 0.0–149.0)
VLDL: 18.6 mg/dL (ref 0.0–40.0)

## 2016-04-13 LAB — HEMOGLOBIN A1C: HEMOGLOBIN A1C: 5.7 % (ref 4.6–6.5)

## 2016-04-13 LAB — TSH: TSH: 7.91 u[IU]/mL — AB (ref 0.35–4.50)

## 2016-04-13 LAB — T4, FREE: Free T4: 1.1 ng/dL (ref 0.60–1.60)

## 2016-04-14 DIAGNOSIS — S82132D Displaced fracture of medial condyle of left tibia, subsequent encounter for closed fracture with routine healing: Secondary | ICD-10-CM | POA: Diagnosis not present

## 2016-04-14 DIAGNOSIS — M1712 Unilateral primary osteoarthritis, left knee: Secondary | ICD-10-CM | POA: Diagnosis not present

## 2016-04-14 DIAGNOSIS — E119 Type 2 diabetes mellitus without complications: Secondary | ICD-10-CM | POA: Diagnosis not present

## 2016-04-14 DIAGNOSIS — J189 Pneumonia, unspecified organism: Secondary | ICD-10-CM | POA: Diagnosis not present

## 2016-04-14 DIAGNOSIS — K219 Gastro-esophageal reflux disease without esophagitis: Secondary | ICD-10-CM | POA: Diagnosis not present

## 2016-04-14 DIAGNOSIS — I10 Essential (primary) hypertension: Secondary | ICD-10-CM | POA: Diagnosis not present

## 2016-04-15 ENCOUNTER — Encounter: Payer: Self-pay | Admitting: Endocrinology

## 2016-04-15 ENCOUNTER — Ambulatory Visit (INDEPENDENT_AMBULATORY_CARE_PROVIDER_SITE_OTHER): Payer: Medicare Other | Admitting: Endocrinology

## 2016-04-15 ENCOUNTER — Other Ambulatory Visit: Payer: Self-pay | Admitting: Endocrinology

## 2016-04-15 ENCOUNTER — Other Ambulatory Visit: Payer: Self-pay

## 2016-04-15 VITALS — BP 120/72 | HR 77 | Ht 68.0 in

## 2016-04-15 DIAGNOSIS — E7800 Pure hypercholesterolemia, unspecified: Secondary | ICD-10-CM | POA: Diagnosis not present

## 2016-04-15 DIAGNOSIS — E039 Hypothyroidism, unspecified: Secondary | ICD-10-CM | POA: Diagnosis not present

## 2016-04-15 DIAGNOSIS — R7301 Impaired fasting glucose: Secondary | ICD-10-CM | POA: Diagnosis not present

## 2016-04-15 DIAGNOSIS — I10 Essential (primary) hypertension: Secondary | ICD-10-CM

## 2016-04-15 DIAGNOSIS — E559 Vitamin D deficiency, unspecified: Secondary | ICD-10-CM

## 2016-04-15 MED ORDER — FUROSEMIDE 20 MG PO TABS: 20.0000 mg | ORAL_TABLET | Freq: Every day | ORAL | 0 refills | Status: DC | PRN

## 2016-04-15 MED ORDER — LEVOTHYROXINE SODIUM 112 MCG PO TABS: 112.0000 ug | ORAL_TABLET | Freq: Every day | ORAL | 3 refills | Status: DC

## 2016-04-15 NOTE — Progress Notes (Signed)
Subjective:     Patient ID: Ellen Henri, female   DOB: [birth year], 83 y.o.   MRN: [id number]  HPI  Reason for visit: General follow-up   Hypertension:  She has had long-standing hypertension which has been well controlled.  Currently on Lotensin HCT and Not on potassium supplements  No lightheadedness She has had previous edema and takes Lasix as needed also  PREDIABETES: She has had long-standing impaired fasting glucose with variable control However A1c has been consistently normal Her glucose was 113 but she had coffee with cream that morning  Lab Results  Component Value Date   HGBA1C 5.7 04/13/2016   HGBA1C 5.9 11/04/2015   HGBA1C 5.9 07/29/2015   Lab Results  Component Value Date   MICROALBUR 0.4 12/05/2012   LDLCALC 159 (H) 04/13/2016   CREATININE 0.58 04/13/2016     UTI:  She has had periodic UTIs She thinks that on the last prescription of Cipro she had a little confusion. She had asked for a follow-up urinalysis recently and this was negative  OTHER active problems are detailed in review of systems:   Allergies as of 04/15/2016      Reactions   Gadolinium     Desc: pt states she had nausea after receiving MAGNEVIST for breast imaging   Iodinated Diagnostic Agents Other (See Comments)   Unknown reaction   Pravastatin Other (See Comments)   Locked jaw, weakness   Simvastatin Other (See Comments)   Weakness and locked jaw.   Crestor [rosuvastatin Calcium] Other (See Comments)   Abdominal discomfort   Penicillins Swelling, Rash   Has patient had a PCN reaction causing immediate rash, facial/tongue/throat swelling, SOB or lightheadedness with hypotension: yes Has patient had a PCN reaction causing severe rash involving mucus membranes or skin necrosis: unknown Has patient had a PCN reaction that required hospitalization: unknown Has patient had a PCN reaction occurring within the last 10 years: no If all of the above answers are "NO",  then may proceed with Cephalosporin use.      Medication List       Accurate as of 04/15/16  3:06 PM. Always use your most recent med list.          azithromycin 250 MG tablet Commonly known as:  ZITHROMAX Take 1 tablet (250 mg total) by mouth daily. Take first 2 tablets together, then 1 every day until finished.   benazepril-hydrochlorthiazide 20-12.5 MG tablet Commonly known as:  LOTENSIN HCT TAKE 1 TABLET ONCE DAILY.   chlorpheniramine-HYDROcodone 10-8 MG/5ML Suer Commonly known as:  Tree surgeon ER Take 1 teaspoon 2 times daily as needed for cough   ELIQUIS 5 MG Tabs tablet Generic drug:  apixaban TAKE 1 TABLET TWICE DAILY.   furosemide 20 MG tablet Commonly known as:  LASIX Take 1 tablet (20 mg total) by mouth daily as needed for edema (if weight high by 2 pounds in 24hrs, weigh yourself daily).   isosorbide mononitrate 30 MG 24 hr tablet Commonly known as:  IMDUR TAKE 1 TABLET ONCE DAILY.   LORazepam 0.5 MG tablet Commonly known as:  ATIVAN TAKE (1) TABLET TWICE DAILY AS NEEDED FOR ANXIETY.   pantoprazole 40 MG tablet Commonly known as:  PROTONIX TAKE 1 TABLET ONCE DAILY.   PERCOCET 10-325 MG tablet Generic drug:  oxyCODONE-acetaminophen Take 0.25-1 tablets by mouth every 6 (six) hours as needed for pain.   polyethylene glycol packet Commonly known as:  MIRALAX / GLYCOLAX Take 17 g by mouth  3 (three) times daily as needed.   SYNTHROID 100 MCG tablet Generic drug:  levothyroxine TAKE 1 TABLET EACH DAY.   Vitamin D (Ergocalciferol) 50000 units Caps capsule Commonly known as:  DRISDOL TAKE ONE CAPSULE ONCE A WEEK.   VOLTAREN 1 % Gel Generic drug:  diclofenac sodium Apply 2 g topically 4 (four) times daily as needed (pain). To knees       Review of Systems  Primary hypothyroidism: She has had long-standing primary hypothyroidism.  She has been on consistent dosage, Using generic levothyroxine 100 g daily.  Recently she has been taking  her medication very regularly in the morning and No vitamins or other supplements at the same time  Does not complain of unusual fatigue but her TSH is high again, similar to 5/17 which was transient     Lab Results  Component Value Date   TSH 7.91 (H) 04/13/2016   TSH 1.63 09/09/2015   TSH 7.22 (H) 07/29/2015   FREET4 1.10 04/13/2016   FREET4 1.10 07/29/2015   FREET4 1.11 08/29/2014    HYPERLIPIDEMIA: Currently not on treatment Although she had agreed to try Crestor every other day on her last visit she refuses to consider any treatment now for fear of side effects LDL is slightly better than usual  Lab Results  Component Value Date   CHOL 235 (H) 04/13/2016   CHOL 248 (H) 11/04/2015   CHOL 280 (H) 07/29/2015   Lab Results  Component Value Date   HDL 57.90 04/13/2016   HDL 54.50 11/04/2015   HDL 49.20 07/29/2015   Lab Results  Component Value Date   LDLCALC 159 (H) 04/13/2016   LDLCALC 174 (H) 11/04/2015   LDLCALC 196 (H) 07/29/2015   Lab Results  Component Value Date   TRIG 93.0 04/13/2016   TRIG 101.0 11/04/2015   TRIG 173.0 (H) 07/29/2015   Lab Results  Component Value Date   CHOLHDL 4 04/13/2016   CHOLHDL 5 11/04/2015   CHOLHDL 6 07/29/2015   Lab Results  Component Value Date   LDLDIRECT 193.5 02/07/2013   LDLDIRECT 189.5 12/11/2012   LDLDIRECT 201.3 12/05/2012        Objective:   Physical Exam    BP 120/72   Pulse 77   Ht 5\' 8"  (1.727 m)   SpO2 97%      Assessment:      HYPERTENSION: Fairly well controlled Also No recent edema and potassium is back to normal  HYPOTHYROIDISM:  TSH is high, will go up to 112 dosage Will have follow-up levels on the next visit  History of hyperlipidemia:  She continues to refuse medications, LDL is however relatively better  History of prediabetes: Nonfasting glucose 113 A1c quite normal Emphasized need for consistent diet and keeping her weight down     Plan:      As above Continue to  monitor lipids  Follow-up in 3 months      [facility] 04/15/16

## 2016-04-19 DIAGNOSIS — I10 Essential (primary) hypertension: Secondary | ICD-10-CM | POA: Diagnosis not present

## 2016-04-19 DIAGNOSIS — E119 Type 2 diabetes mellitus without complications: Secondary | ICD-10-CM | POA: Diagnosis not present

## 2016-04-19 DIAGNOSIS — K219 Gastro-esophageal reflux disease without esophagitis: Secondary | ICD-10-CM | POA: Diagnosis not present

## 2016-04-19 DIAGNOSIS — J189 Pneumonia, unspecified organism: Secondary | ICD-10-CM | POA: Diagnosis not present

## 2016-04-19 DIAGNOSIS — M1712 Unilateral primary osteoarthritis, left knee: Secondary | ICD-10-CM | POA: Diagnosis not present

## 2016-04-19 DIAGNOSIS — S82132D Displaced fracture of medial condyle of left tibia, subsequent encounter for closed fracture with routine healing: Secondary | ICD-10-CM | POA: Diagnosis not present

## 2016-04-21 DIAGNOSIS — I10 Essential (primary) hypertension: Secondary | ICD-10-CM | POA: Diagnosis not present

## 2016-04-21 DIAGNOSIS — S82132D Displaced fracture of medial condyle of left tibia, subsequent encounter for closed fracture with routine healing: Secondary | ICD-10-CM | POA: Diagnosis not present

## 2016-04-21 DIAGNOSIS — J189 Pneumonia, unspecified organism: Secondary | ICD-10-CM | POA: Diagnosis not present

## 2016-04-21 DIAGNOSIS — M1712 Unilateral primary osteoarthritis, left knee: Secondary | ICD-10-CM | POA: Diagnosis not present

## 2016-04-21 DIAGNOSIS — E119 Type 2 diabetes mellitus without complications: Secondary | ICD-10-CM | POA: Diagnosis not present

## 2016-04-21 DIAGNOSIS — K219 Gastro-esophageal reflux disease without esophagitis: Secondary | ICD-10-CM | POA: Diagnosis not present

## 2016-04-26 DIAGNOSIS — S82132D Displaced fracture of medial condyle of left tibia, subsequent encounter for closed fracture with routine healing: Secondary | ICD-10-CM | POA: Diagnosis not present

## 2016-04-26 DIAGNOSIS — J189 Pneumonia, unspecified organism: Secondary | ICD-10-CM | POA: Diagnosis not present

## 2016-04-26 DIAGNOSIS — K219 Gastro-esophageal reflux disease without esophagitis: Secondary | ICD-10-CM | POA: Diagnosis not present

## 2016-04-26 DIAGNOSIS — I10 Essential (primary) hypertension: Secondary | ICD-10-CM | POA: Diagnosis not present

## 2016-04-26 DIAGNOSIS — E119 Type 2 diabetes mellitus without complications: Secondary | ICD-10-CM | POA: Diagnosis not present

## 2016-04-26 DIAGNOSIS — M1712 Unilateral primary osteoarthritis, left knee: Secondary | ICD-10-CM | POA: Diagnosis not present

## 2016-04-27 DIAGNOSIS — M170 Bilateral primary osteoarthritis of knee: Secondary | ICD-10-CM | POA: Diagnosis not present

## 2016-04-28 DIAGNOSIS — M1712 Unilateral primary osteoarthritis, left knee: Secondary | ICD-10-CM | POA: Diagnosis not present

## 2016-04-28 DIAGNOSIS — J189 Pneumonia, unspecified organism: Secondary | ICD-10-CM | POA: Diagnosis not present

## 2016-04-28 DIAGNOSIS — I10 Essential (primary) hypertension: Secondary | ICD-10-CM | POA: Diagnosis not present

## 2016-04-28 DIAGNOSIS — K219 Gastro-esophageal reflux disease without esophagitis: Secondary | ICD-10-CM | POA: Diagnosis not present

## 2016-04-28 DIAGNOSIS — S82132D Displaced fracture of medial condyle of left tibia, subsequent encounter for closed fracture with routine healing: Secondary | ICD-10-CM | POA: Diagnosis not present

## 2016-04-28 DIAGNOSIS — E119 Type 2 diabetes mellitus without complications: Secondary | ICD-10-CM | POA: Diagnosis not present

## 2016-05-03 DIAGNOSIS — S82132D Displaced fracture of medial condyle of left tibia, subsequent encounter for closed fracture with routine healing: Secondary | ICD-10-CM | POA: Diagnosis not present

## 2016-05-03 DIAGNOSIS — E119 Type 2 diabetes mellitus without complications: Secondary | ICD-10-CM | POA: Diagnosis not present

## 2016-05-03 DIAGNOSIS — I10 Essential (primary) hypertension: Secondary | ICD-10-CM | POA: Diagnosis not present

## 2016-05-03 DIAGNOSIS — M1712 Unilateral primary osteoarthritis, left knee: Secondary | ICD-10-CM | POA: Diagnosis not present

## 2016-05-03 DIAGNOSIS — K219 Gastro-esophageal reflux disease without esophagitis: Secondary | ICD-10-CM | POA: Diagnosis not present

## 2016-05-03 DIAGNOSIS — J189 Pneumonia, unspecified organism: Secondary | ICD-10-CM | POA: Diagnosis not present

## 2016-05-05 DIAGNOSIS — M1712 Unilateral primary osteoarthritis, left knee: Secondary | ICD-10-CM | POA: Diagnosis not present

## 2016-05-05 DIAGNOSIS — K219 Gastro-esophageal reflux disease without esophagitis: Secondary | ICD-10-CM | POA: Diagnosis not present

## 2016-05-05 DIAGNOSIS — I10 Essential (primary) hypertension: Secondary | ICD-10-CM | POA: Diagnosis not present

## 2016-05-05 DIAGNOSIS — E119 Type 2 diabetes mellitus without complications: Secondary | ICD-10-CM | POA: Diagnosis not present

## 2016-05-05 DIAGNOSIS — S82132D Displaced fracture of medial condyle of left tibia, subsequent encounter for closed fracture with routine healing: Secondary | ICD-10-CM | POA: Diagnosis not present

## 2016-05-05 DIAGNOSIS — J189 Pneumonia, unspecified organism: Secondary | ICD-10-CM | POA: Diagnosis not present

## 2016-05-10 DIAGNOSIS — K219 Gastro-esophageal reflux disease without esophagitis: Secondary | ICD-10-CM | POA: Diagnosis not present

## 2016-05-10 DIAGNOSIS — M1712 Unilateral primary osteoarthritis, left knee: Secondary | ICD-10-CM | POA: Diagnosis not present

## 2016-05-10 DIAGNOSIS — S82132D Displaced fracture of medial condyle of left tibia, subsequent encounter for closed fracture with routine healing: Secondary | ICD-10-CM | POA: Diagnosis not present

## 2016-05-10 DIAGNOSIS — E119 Type 2 diabetes mellitus without complications: Secondary | ICD-10-CM | POA: Diagnosis not present

## 2016-05-10 DIAGNOSIS — J189 Pneumonia, unspecified organism: Secondary | ICD-10-CM | POA: Diagnosis not present

## 2016-05-10 DIAGNOSIS — I10 Essential (primary) hypertension: Secondary | ICD-10-CM | POA: Diagnosis not present

## 2016-05-12 DIAGNOSIS — M1712 Unilateral primary osteoarthritis, left knee: Secondary | ICD-10-CM | POA: Diagnosis not present

## 2016-05-12 DIAGNOSIS — S82132D Displaced fracture of medial condyle of left tibia, subsequent encounter for closed fracture with routine healing: Secondary | ICD-10-CM | POA: Diagnosis not present

## 2016-05-12 DIAGNOSIS — I10 Essential (primary) hypertension: Secondary | ICD-10-CM | POA: Diagnosis not present

## 2016-05-12 DIAGNOSIS — K219 Gastro-esophageal reflux disease without esophagitis: Secondary | ICD-10-CM | POA: Diagnosis not present

## 2016-05-12 DIAGNOSIS — J189 Pneumonia, unspecified organism: Secondary | ICD-10-CM | POA: Diagnosis not present

## 2016-05-12 DIAGNOSIS — E119 Type 2 diabetes mellitus without complications: Secondary | ICD-10-CM | POA: Diagnosis not present

## 2016-05-17 ENCOUNTER — Other Ambulatory Visit: Payer: Self-pay | Admitting: Endocrinology

## 2016-05-17 ENCOUNTER — Telehealth: Payer: Self-pay | Admitting: Endocrinology

## 2016-05-17 NOTE — Telephone Encounter (Signed)
Patient has the form for the home health, she is going to mail it to [place].

## 2016-05-18 NOTE — Telephone Encounter (Signed)
Medical assistance form given to Dr to sign

## 2016-05-19 DIAGNOSIS — S82132D Displaced fracture of medial condyle of left tibia, subsequent encounter for closed fracture with routine healing: Secondary | ICD-10-CM | POA: Diagnosis not present

## 2016-05-19 DIAGNOSIS — J189 Pneumonia, unspecified organism: Secondary | ICD-10-CM | POA: Diagnosis not present

## 2016-05-19 DIAGNOSIS — E119 Type 2 diabetes mellitus without complications: Secondary | ICD-10-CM | POA: Diagnosis not present

## 2016-05-19 DIAGNOSIS — M1712 Unilateral primary osteoarthritis, left knee: Secondary | ICD-10-CM | POA: Diagnosis not present

## 2016-05-19 DIAGNOSIS — I10 Essential (primary) hypertension: Secondary | ICD-10-CM | POA: Diagnosis not present

## 2016-05-19 DIAGNOSIS — K219 Gastro-esophageal reflux disease without esophagitis: Secondary | ICD-10-CM | POA: Diagnosis not present

## 2016-05-19 NOTE — Telephone Encounter (Signed)
Medical assistance form signed and faxed back

## 2016-05-21 DIAGNOSIS — S82132D Displaced fracture of medial condyle of left tibia, subsequent encounter for closed fracture with routine healing: Secondary | ICD-10-CM | POA: Diagnosis not present

## 2016-05-21 DIAGNOSIS — E119 Type 2 diabetes mellitus without complications: Secondary | ICD-10-CM | POA: Diagnosis not present

## 2016-05-21 DIAGNOSIS — J189 Pneumonia, unspecified organism: Secondary | ICD-10-CM | POA: Diagnosis not present

## 2016-05-21 DIAGNOSIS — I10 Essential (primary) hypertension: Secondary | ICD-10-CM | POA: Diagnosis not present

## 2016-05-21 DIAGNOSIS — M1712 Unilateral primary osteoarthritis, left knee: Secondary | ICD-10-CM | POA: Diagnosis not present

## 2016-05-21 DIAGNOSIS — K219 Gastro-esophageal reflux disease without esophagitis: Secondary | ICD-10-CM | POA: Diagnosis not present

## 2016-05-24 DIAGNOSIS — E119 Type 2 diabetes mellitus without complications: Secondary | ICD-10-CM | POA: Diagnosis not present

## 2016-05-24 DIAGNOSIS — J189 Pneumonia, unspecified organism: Secondary | ICD-10-CM | POA: Diagnosis not present

## 2016-05-24 DIAGNOSIS — K219 Gastro-esophageal reflux disease without esophagitis: Secondary | ICD-10-CM | POA: Diagnosis not present

## 2016-05-24 DIAGNOSIS — I10 Essential (primary) hypertension: Secondary | ICD-10-CM | POA: Diagnosis not present

## 2016-05-24 DIAGNOSIS — S82132D Displaced fracture of medial condyle of left tibia, subsequent encounter for closed fracture with routine healing: Secondary | ICD-10-CM | POA: Diagnosis not present

## 2016-05-24 DIAGNOSIS — M1712 Unilateral primary osteoarthritis, left knee: Secondary | ICD-10-CM | POA: Diagnosis not present

## 2016-05-25 DIAGNOSIS — M170 Bilateral primary osteoarthritis of knee: Secondary | ICD-10-CM | POA: Diagnosis not present

## 2016-05-27 DIAGNOSIS — S82132D Displaced fracture of medial condyle of left tibia, subsequent encounter for closed fracture with routine healing: Secondary | ICD-10-CM | POA: Diagnosis not present

## 2016-05-27 DIAGNOSIS — M1712 Unilateral primary osteoarthritis, left knee: Secondary | ICD-10-CM | POA: Diagnosis not present

## 2016-05-27 DIAGNOSIS — J189 Pneumonia, unspecified organism: Secondary | ICD-10-CM | POA: Diagnosis not present

## 2016-05-27 DIAGNOSIS — K219 Gastro-esophageal reflux disease without esophagitis: Secondary | ICD-10-CM | POA: Diagnosis not present

## 2016-05-27 DIAGNOSIS — E119 Type 2 diabetes mellitus without complications: Secondary | ICD-10-CM | POA: Diagnosis not present

## 2016-05-27 DIAGNOSIS — I10 Essential (primary) hypertension: Secondary | ICD-10-CM | POA: Diagnosis not present

## 2016-06-02 DIAGNOSIS — S82132D Displaced fracture of medial condyle of left tibia, subsequent encounter for closed fracture with routine healing: Secondary | ICD-10-CM | POA: Diagnosis not present

## 2016-06-02 DIAGNOSIS — I10 Essential (primary) hypertension: Secondary | ICD-10-CM | POA: Diagnosis not present

## 2016-06-02 DIAGNOSIS — J189 Pneumonia, unspecified organism: Secondary | ICD-10-CM | POA: Diagnosis not present

## 2016-06-02 DIAGNOSIS — E119 Type 2 diabetes mellitus without complications: Secondary | ICD-10-CM | POA: Diagnosis not present

## 2016-06-02 DIAGNOSIS — K219 Gastro-esophageal reflux disease without esophagitis: Secondary | ICD-10-CM | POA: Diagnosis not present

## 2016-06-02 DIAGNOSIS — M1712 Unilateral primary osteoarthritis, left knee: Secondary | ICD-10-CM | POA: Diagnosis not present

## 2016-06-04 DIAGNOSIS — E119 Type 2 diabetes mellitus without complications: Secondary | ICD-10-CM | POA: Diagnosis not present

## 2016-06-04 DIAGNOSIS — S82132D Displaced fracture of medial condyle of left tibia, subsequent encounter for closed fracture with routine healing: Secondary | ICD-10-CM | POA: Diagnosis not present

## 2016-06-04 DIAGNOSIS — M1712 Unilateral primary osteoarthritis, left knee: Secondary | ICD-10-CM | POA: Diagnosis not present

## 2016-06-04 DIAGNOSIS — K219 Gastro-esophageal reflux disease without esophagitis: Secondary | ICD-10-CM | POA: Diagnosis not present

## 2016-06-04 DIAGNOSIS — I10 Essential (primary) hypertension: Secondary | ICD-10-CM | POA: Diagnosis not present

## 2016-06-04 DIAGNOSIS — J189 Pneumonia, unspecified organism: Secondary | ICD-10-CM | POA: Diagnosis not present

## 2016-06-14 DIAGNOSIS — Z961 Presence of intraocular lens: Secondary | ICD-10-CM | POA: Diagnosis not present

## 2016-06-14 DIAGNOSIS — H353132 Nonexudative age-related macular degeneration, bilateral, intermediate dry stage: Secondary | ICD-10-CM | POA: Diagnosis not present

## 2016-06-17 ENCOUNTER — Other Ambulatory Visit: Payer: Self-pay | Admitting: Endocrinology

## 2016-06-22 ENCOUNTER — Other Ambulatory Visit (HOSPITAL_BASED_OUTPATIENT_CLINIC_OR_DEPARTMENT_OTHER): Payer: Medicare Other

## 2016-06-22 ENCOUNTER — Ambulatory Visit (HOSPITAL_BASED_OUTPATIENT_CLINIC_OR_DEPARTMENT_OTHER): Payer: Medicare Other | Admitting: Oncology

## 2016-06-22 VITALS — BP 92/69 | HR 84 | Temp 98.1°F | Resp 18 | Ht 68.0 in

## 2016-06-22 DIAGNOSIS — Z853 Personal history of malignant neoplasm of breast: Secondary | ICD-10-CM

## 2016-06-22 DIAGNOSIS — D72829 Elevated white blood cell count, unspecified: Secondary | ICD-10-CM

## 2016-06-22 DIAGNOSIS — M170 Bilateral primary osteoarthritis of knee: Secondary | ICD-10-CM | POA: Diagnosis not present

## 2016-06-22 LAB — CBC WITH DIFFERENTIAL/PLATELET
BASO%: 0.1 % (ref 0.0–2.0)
BASOS ABS: 0 10*3/uL (ref 0.0–0.1)
EOS ABS: 0.2 10*3/uL (ref 0.0–0.5)
EOS%: 1.8 % (ref 0.0–7.0)
HEMATOCRIT: 43 % (ref 34.8–46.6)
HEMOGLOBIN: 14.8 g/dL (ref 11.6–15.9)
LYMPH%: 28.2 % (ref 14.0–49.7)
MCH: 30.3 pg (ref 25.1–34.0)
MCHC: 34.4 g/dL (ref 31.5–36.0)
MCV: 87.9 fL (ref 79.5–101.0)
MONO#: 0.8 10*3/uL (ref 0.1–0.9)
MONO%: 7 % (ref 0.0–14.0)
NEUT%: 62.9 % (ref 38.4–76.8)
NEUTROS ABS: 6.8 10*3/uL — AB (ref 1.5–6.5)
PLATELETS: 218 10*3/uL (ref 145–400)
RBC: 4.89 10*6/uL (ref 3.70–5.45)
RDW: 13.8 % (ref 11.2–14.5)
WBC: 10.8 10*3/uL — AB (ref 3.9–10.3)
lymph#: 3.1 10*3/uL (ref 0.9–3.3)

## 2016-06-22 NOTE — Progress Notes (Signed)
  [place] OFFICE PROGRESS NOTE   Diagnosis: Leukocytosis, history of breast cancer  INTERVAL HISTORY:   [name] returns as scheduled. She injured the left lower leg last year. She continues to have steroid injections in the knees. She has not followed up with Dr. Nori [name] for a GYN evaluation and has not undergone repeat breast imaging. She has noted a fullness at the left axilla.  Objective:  Vital signs in last 24 hours:  Blood pressure 92/69, pulse 84, temperature 98.1 F (36.7 C), temperature source Oral, resp. rate 18, height _0  (1.727 m), SpO2 98 %.    HEENT: Neck without mass Lymphatics: No cervical, supraclavicular, or axillary nodes Resp: Lungs clear bilaterally Cardio: Regular rate and rhythm, 2/6 systolic murmur GI: No hepatosplenomegaly Vascular: Varicosities at the lower legs bilaterally Breast: Bilateral implants in place, status post left mastectomy. No evidence for chest wall tumor recurrence. Right breast without mass. The left implant extends to the medial aspect of the left axilla with soft fullness in the medial axilla. No discrete mass.    Lab Results:  Lab Results  Component Value Date   WBC 10.8 (H) 06/22/2016   HGB 14.8 06/22/2016   HCT 43.0 06/22/2016   MCV 87.9 06/22/2016   PLT 218 06/22/2016   NEUTROABS 6.8 (H) 06/22/2016     Medications: I have reviewed the patient's current medications.  Assessment/Plan: 1. Leukocytosis-mild neutrophilia 2. Knee arthritis 3. BRCA2 carrier 4. Remote history of breast cancer 5. Hypothyroidism 6. Hyperlipidemia 7. Family history of multiple cancers 8. Bilateral pulmonary embolism June 2017-maintained on Apixaban     Disposition:  [name] is stable from a hematologic standpoint. She has persistent mild neutrophilia. No clinical evidence of a lymphoproliferative disorder. She remains in clinical remission from breast cancer. The left breast implant may be out of  place or ruptured. I recommended she follow-up with her plastic surgeon to evaluate the left breast and axilla. I recommended she follow-up with Dr. Nori [name] for a GYN evaluation.  She would like to continue follow-up at the [facility]. She will return for an office visit in 6 months.  The blood pressure is low today. She appears asymptomatic. We will recommend she follow-up with Dr. Lysle [name] to discuss the indication for continuing antihypertensive therapy.  Angela Coder, MD  06/22/2016  12:35 PM

## 2016-06-30 ENCOUNTER — Telehealth: Payer: Self-pay | Admitting: Oncology

## 2016-06-30 NOTE — Telephone Encounter (Signed)
Left message re 10/4 lab/fu. Schedule mailed.

## 2016-07-06 ENCOUNTER — Telehealth: Payer: Self-pay | Admitting: Endocrinology

## 2016-07-06 ENCOUNTER — Other Ambulatory Visit (INDEPENDENT_AMBULATORY_CARE_PROVIDER_SITE_OTHER): Payer: Medicare Other

## 2016-07-06 ENCOUNTER — Other Ambulatory Visit: Payer: Self-pay

## 2016-07-06 DIAGNOSIS — E559 Vitamin D deficiency, unspecified: Secondary | ICD-10-CM

## 2016-07-06 DIAGNOSIS — E7800 Pure hypercholesterolemia, unspecified: Secondary | ICD-10-CM | POA: Diagnosis not present

## 2016-07-06 DIAGNOSIS — I10 Essential (primary) hypertension: Secondary | ICD-10-CM | POA: Diagnosis not present

## 2016-07-06 DIAGNOSIS — E039 Hypothyroidism, unspecified: Secondary | ICD-10-CM

## 2016-07-06 LAB — URINALYSIS, ROUTINE W REFLEX MICROSCOPIC
Bilirubin Urine: NEGATIVE
Hgb urine dipstick: NEGATIVE
KETONES UR: NEGATIVE
Leukocytes, UA: NEGATIVE
Nitrite: NEGATIVE
PH: 6.5 (ref 5.0–8.0)
RBC / HPF: NONE SEEN (ref 0–?)
SPECIFIC GRAVITY, URINE: 1.015 (ref 1.000–1.030)
Total Protein, Urine: NEGATIVE
URINE GLUCOSE: NEGATIVE
UROBILINOGEN UA: 0.2 (ref 0.0–1.0)

## 2016-07-06 LAB — LIPID PANEL
CHOL/HDL RATIO: 4
Cholesterol: 210 mg/dL — ABNORMAL HIGH (ref 0–200)
HDL: 48.1 mg/dL (ref >39.00)
LDL CALC: 144 mg/dL — AB (ref 0–99)
NonHDL: 161.72
TRIGLYCERIDES: 91 mg/dL (ref 0.0–149.0)
VLDL: 18.2 mg/dL (ref 0.0–40.0)

## 2016-07-06 LAB — CBC
HCT: 43.8 % (ref 36.0–46.0)
HEMOGLOBIN: 14.7 g/dL (ref 12.0–15.0)
MCHC: 33.6 g/dL (ref 30.0–36.0)
MCV: 89.3 fl (ref 78.0–100.0)
PLATELETS: 244 10*3/uL (ref 150.0–400.0)
RBC: 4.9 Mil/uL (ref 3.87–5.11)
RDW: 14.5 % (ref 11.5–15.5)
WBC: 21.5 10*3/uL (ref 4.0–10.5)

## 2016-07-06 LAB — COMPREHENSIVE METABOLIC PANEL
ALBUMIN: 3.6 g/dL (ref 3.5–5.2)
ALK PHOS: 98 U/L (ref 39–117)
ALT: 15 U/L (ref 0–35)
AST: 12 U/L (ref 0–37)
BILIRUBIN TOTAL: 1.1 mg/dL (ref 0.2–1.2)
BUN: 15 mg/dL (ref 6–23)
CALCIUM: 9 mg/dL (ref 8.4–10.5)
CO2: 34 mEq/L — ABNORMAL HIGH (ref 19–32)
CREATININE: 0.52 mg/dL (ref 0.40–1.20)
Chloride: 99 mEq/L (ref 96–112)
GFR: 119.97 mL/min (ref >60.00)
Glucose, Bld: 99 mg/dL (ref 70–99)
Potassium: 3.5 mEq/L (ref 3.5–5.1)
Sodium: 139 mEq/L (ref 135–145)
Total Protein: 6.3 g/dL (ref 6.0–8.3)

## 2016-07-06 LAB — VITAMIN D 25 HYDROXY (VIT D DEFICIENCY, FRACTURES): VITD: 38.78 ng/mL (ref 30.00–100.00)

## 2016-07-06 LAB — TSH: TSH: 1.42 u[IU]/mL (ref 0.35–4.50)

## 2016-07-06 MED ORDER — HYDROCOD POLST-CPM POLST ER 10-8 MG/5ML PO SUER: 5.0000 mL | Freq: Two times a day (BID) | ORAL | 0 refills | Status: DC

## 2016-07-06 MED ORDER — AZITHROMYCIN 250 MG PO TABS: ORAL_TABLET | ORAL | 0 refills | Status: DC

## 2016-07-06 NOTE — Telephone Encounter (Signed)
Please send Z-Pack/Zithromax 250 mg

## 2016-07-06 NOTE — Telephone Encounter (Signed)
Ordered

## 2016-07-06 NOTE — Telephone Encounter (Signed)
Nose is running, coughing up yellow phlegm, sore throat, fever, pt is here for labs and needs help

## 2016-07-07 LAB — T4, FREE: FREE T4: 1.21 ng/dL (ref 0.60–1.60)

## 2016-07-13 ENCOUNTER — Encounter: Payer: Self-pay | Admitting: Endocrinology

## 2016-07-13 ENCOUNTER — Ambulatory Visit (INDEPENDENT_AMBULATORY_CARE_PROVIDER_SITE_OTHER): Payer: Medicare Other | Admitting: Endocrinology

## 2016-07-13 VITALS — BP 138/76 | HR 93 | Ht 68.0 in

## 2016-07-13 DIAGNOSIS — R7303 Prediabetes: Secondary | ICD-10-CM

## 2016-07-13 DIAGNOSIS — E039 Hypothyroidism, unspecified: Secondary | ICD-10-CM

## 2016-07-13 DIAGNOSIS — E7800 Pure hypercholesterolemia, unspecified: Secondary | ICD-10-CM | POA: Diagnosis not present

## 2016-07-13 DIAGNOSIS — D72829 Elevated white blood cell count, unspecified: Secondary | ICD-10-CM | POA: Diagnosis not present

## 2016-07-13 DIAGNOSIS — I10 Essential (primary) hypertension: Secondary | ICD-10-CM | POA: Diagnosis not present

## 2016-07-13 MED ORDER — HYDROCOD POLST-CPM POLST ER 10-8 MG/5ML PO SUER: 5.0000 mL | Freq: Two times a day (BID) | ORAL | 0 refills | Status: DC

## 2016-07-13 NOTE — Patient Instructions (Signed)
OTC Mucinex for cough

## 2016-07-13 NOTE — Progress Notes (Signed)
Subjective:     Patient ID: Angela [name], female   DOB: 1932-07-28, 83 y.o.   MRN: [id number]  HPI  Reason for visit: General follow-up   Hypertension:  She has had long-standing hypertension which has been well controlled.  Currently on Lotensin HCT 20/12.5 Blood pressure was initially high today but better on repeat measurement. She apparently had a lower blood pressure reading with the hematologist but was asymptomatic  She has had previous edema and takes Lasix as needed also  PREDIABETES: She has had long-standing impaired fasting glucose  However A1c has been consistently normal and she has not been on metformin Her glucose was 99 late morning with her labs   Lab Results  Component Value Date   HGBA1C 5.7 04/13/2016   HGBA1C 5.9 11/04/2015   HGBA1C 5.9 07/29/2015   Lab Results  Component Value Date   MICROALBUR 0.4 12/05/2012   LDLCALC 144 (H) 07/06/2016   CREATININE 0.52 07/06/2016     UTI:  She has had periodic UTIs She thinks that on the last prescription of Cipro she had a little confusion.Recent urinalysis normal   BRONCHITIS:  Lastly she had a lot of cough and yellowish sputum and she has had this episode before She was given Zithromax for 5 days until last Friday or so and Tussionex She says she still having some cough but mostly at night but it is better She thinks she takes more than 1 teaspoon of Tussionex at night when she has cough and is asking for a refill She has thick sputum and is not able to expectorate this   OTHER active problems are detailed in review of systems:   Allergies as of 07/13/2016      Reactions   Gadolinium     Desc: pt states she had nausea after receiving MAGNEVIST for breast imaging   Iodinated Diagnostic Agents Other (See Comments)   Unknown reaction   Pravastatin Other (See Comments)   Locked jaw, weakness   Simvastatin Other (See Comments)   Weakness and locked jaw.   Crestor [rosuvastatin  Calcium] Other (See Comments)   Abdominal discomfort   Penicillins Swelling, Rash   Has patient had a PCN reaction causing immediate rash, facial/tongue/throat swelling, SOB or lightheadedness with hypotension: yes Has patient had a PCN reaction causing severe rash involving mucus membranes or skin necrosis: unknown Has patient had a PCN reaction that required hospitalization: unknown Has patient had a PCN reaction occurring within the last 10 years: no If all of the above answers are "NO", then may proceed with Cephalosporin use.      Medication List       Accurate as of 07/13/16  2:33 PM. Always use your most recent med list.          azithromycin 250 MG tablet Commonly known as:  ZITHROMAX Take 2 tablets the first day than 1 tablet for the next 4 days   benazepril-hydrochlorthiazide 20-12.5 MG tablet Commonly known as:  LOTENSIN HCT TAKE 1 TABLET ONCE DAILY.   TUSSIONEX PENNKINETIC ER 10-8 MG/5ML Suer Generic drug:  chlorpheniramine-HYDROcodone Take 5 mLs by mouth 2 (two) times daily.   chlorpheniramine-HYDROcodone 10-8 MG/5ML Suer Commonly known as:  TUSSIONEX PENNKINETIC ER Take 5 mLs by mouth 2 (two) times daily.   ELIQUIS 5 MG Tabs tablet Generic drug:  apixaban TAKE 1 TABLET TWICE DAILY.   furosemide 20 MG tablet Commonly known as:  LASIX Take 1 tablet (20 mg total) by mouth daily  as needed for edema (if weight high by 2 pounds in 24hrs, weigh yourself daily).   isosorbide mononitrate 30 MG 24 hr tablet Commonly known as:  IMDUR TAKE 1 TABLET ONCE DAILY.   levothyroxine 112 MCG tablet Commonly known as:  SYNTHROID, LEVOTHROID Take 1 tablet (112 mcg total) by mouth daily.   LORazepam 0.5 MG tablet Commonly known as:  ATIVAN TAKE (1) TABLET TWICE DAILY AS NEEDED FOR ANXIETY.   pantoprazole 40 MG tablet Commonly known as:  PROTONIX TAKE 1 TABLET ONCE DAILY.   PERCOCET 10-325 MG tablet Generic drug:  oxyCODONE-acetaminophen Take 0.25-1 tablets by mouth  every 6 (six) hours as needed for pain.   polyethylene glycol packet Commonly known as:  MIRALAX / GLYCOLAX Take 17 g by mouth 3 (three) times daily as needed.   Vitamin D (Ergocalciferol) 50000 units Caps capsule Commonly known as:  DRISDOL TAKE ONE CAPSULE ONCE A WEEK.   VOLTAREN 1 % Gel Generic drug:  diclofenac sodium Apply 2 g topically 4 (four) times daily as needed (pain). To knees       Review of Systems  Primary hypothyroidism: She has had long-standing primary hypothyroidism.   She had a high TSH in 1/18 although was asymptomatic Now taking 112 g levothyroxine  She has been taking her medication very regularly in the morning and No vitamins or other supplements at the same time    Lab Results  Component Value Date   TSH 1.42 07/06/2016   TSH 7.91 (H) 04/13/2016   TSH 1.63 09/09/2015   FREET4 1.21 07/06/2016   FREET4 1.10 04/13/2016   FREET4 1.10 07/29/2015    HYPERLIPIDEMIA: Still not on treatment  Although she had agreed to try Crestor every other day on her last visit she refuses to consider any treatment now for fear of side effects LDL is better than usual  Lab Results  Component Value Date   CHOL 210 (H) 07/06/2016   CHOL 235 (H) 04/13/2016   CHOL 248 (H) 11/04/2015   Lab Results  Component Value Date   HDL 48.10 07/06/2016   HDL 57.90 04/13/2016   HDL 54.50 11/04/2015   Lab Results  Component Value Date   LDLCALC 144 (H) 07/06/2016   LDLCALC 159 (H) 04/13/2016   LDLCALC 174 (H) 11/04/2015   Lab Results  Component Value Date   TRIG 91.0 07/06/2016   TRIG 93.0 04/13/2016   TRIG 101.0 11/04/2015   Lab Results  Component Value Date   CHOLHDL 4 07/06/2016   CHOLHDL 4 04/13/2016   CHOLHDL 5 11/04/2015   Lab Results  Component Value Date   LDLDIRECT 193.5 02/07/2013   LDLDIRECT 189.5 12/11/2012   LDLDIRECT 201.3 12/05/2012        Objective:   Physical Exam  BP 126/88   Pulse 93   Ht 5\' 8"  (9.811 m)   Systolic ejection  murmur present. No S3/S4 No edema present Lungs are clear bilaterally     Assessment:      HYPERTENSION:  well controlled, she will continue the same dosage  HYPOTHYROIDISM:  TSH is better with the 112 dose  History of hyperlipidemia:  She continues to refuse medications, LDL is however not as high  History of prediabetes: Nonfasting glucose below 100  Recent BRONCHITIS: She is symptomatically much better but she still has some thick sputum She is asking for another antibiotic course but discussed that she should continue to benefit from the dose she has taken recently off the Zithromax  LEUKOCYTOSIS:  This is idiopathic and followed by hematologist, recently white blood cells were higher because of infection     Plan:      As above Tussionex refills She needs to use Mucinex for her cough expectorate better  Encourage her to follow-up with her cardiologist No change in thyroid dosage  Continue to monitor lipids  Follow-up in 3 months      [facility] 07/13/16

## 2016-07-16 ENCOUNTER — Other Ambulatory Visit: Payer: Self-pay | Admitting: Endocrinology

## 2016-07-20 DIAGNOSIS — M170 Bilateral primary osteoarthritis of knee: Secondary | ICD-10-CM | POA: Diagnosis not present

## 2016-07-20 DIAGNOSIS — L723 Sebaceous cyst: Secondary | ICD-10-CM | POA: Diagnosis not present

## 2016-07-20 DIAGNOSIS — D0472 Carcinoma in situ of skin of left lower limb, including hip: Secondary | ICD-10-CM | POA: Diagnosis not present

## 2016-07-20 DIAGNOSIS — L821 Other seborrheic keratosis: Secondary | ICD-10-CM | POA: Diagnosis not present

## 2016-07-20 DIAGNOSIS — D485 Neoplasm of uncertain behavior of skin: Secondary | ICD-10-CM | POA: Diagnosis not present

## 2016-07-20 DIAGNOSIS — L820 Inflamed seborrheic keratosis: Secondary | ICD-10-CM | POA: Diagnosis not present

## 2016-07-20 DIAGNOSIS — D2271 Melanocytic nevi of right lower limb, including hip: Secondary | ICD-10-CM | POA: Diagnosis not present

## 2016-07-20 DIAGNOSIS — L718 Other rosacea: Secondary | ICD-10-CM | POA: Diagnosis not present

## 2016-07-21 ENCOUNTER — Telehealth: Payer: Self-pay | Admitting: Endocrinology

## 2016-07-21 NOTE — Telephone Encounter (Signed)
I contacted the patient and advised of message. She voiced understanding and had no further questions. Patient will have a friend come by and pick her paper work up.

## 2016-07-21 NOTE — Telephone Encounter (Signed)
Patient ask to call her

## 2016-07-21 NOTE — Telephone Encounter (Signed)
Pateint called to state she was very upset the home health form would not be completed for her. Patient stated one of her other physicians will complete the form for her.

## 2016-07-21 NOTE — Telephone Encounter (Signed)
She is asking for increased level of care for afternoon and evening and I do not have any information to support this change of level of service, she had not become worse recently

## 2016-07-21 NOTE — Telephone Encounter (Signed)
Patient ask you to call her.

## 2016-07-21 NOTE — Telephone Encounter (Signed)
I contacted the patient and she stated the medical reasons she requires home health is she has trouble walking and bathing. Patient stated without assistance she cannot preform these functions.

## 2016-07-21 NOTE — Telephone Encounter (Signed)
Pt is asking for a call back-no specifics given but is asking for a call back this AM

## 2016-08-05 ENCOUNTER — Other Ambulatory Visit: Payer: Self-pay | Admitting: *Deleted

## 2016-08-05 DIAGNOSIS — I7121 Aneurysm of the ascending aorta, without rupture: Secondary | ICD-10-CM

## 2016-08-05 DIAGNOSIS — I712 Thoracic aortic aneurysm, without rupture: Secondary | ICD-10-CM

## 2016-08-10 DIAGNOSIS — Z01419 Encounter for gynecological examination (general) (routine) without abnormal findings: Secondary | ICD-10-CM | POA: Diagnosis not present

## 2016-08-10 DIAGNOSIS — Z853 Personal history of malignant neoplasm of breast: Secondary | ICD-10-CM | POA: Diagnosis not present

## 2016-08-10 DIAGNOSIS — Z124 Encounter for screening for malignant neoplasm of cervix: Secondary | ICD-10-CM | POA: Diagnosis not present

## 2016-08-10 DIAGNOSIS — Z1272 Encounter for screening for malignant neoplasm of vagina: Secondary | ICD-10-CM | POA: Diagnosis not present

## 2016-08-11 ENCOUNTER — Other Ambulatory Visit: Payer: Self-pay | Admitting: Obstetrics & Gynecology

## 2016-08-13 ENCOUNTER — Other Ambulatory Visit: Payer: Self-pay | Admitting: Endocrinology

## 2016-08-17 ENCOUNTER — Other Ambulatory Visit: Payer: Self-pay | Admitting: Obstetrics & Gynecology

## 2016-08-17 DIAGNOSIS — Z853 Personal history of malignant neoplasm of breast: Secondary | ICD-10-CM

## 2016-08-17 DIAGNOSIS — M170 Bilateral primary osteoarthritis of knee: Secondary | ICD-10-CM | POA: Diagnosis not present

## 2016-08-31 ENCOUNTER — Encounter: Payer: Self-pay | Admitting: Thoracic Surgery (Cardiothoracic Vascular Surgery)

## 2016-08-31 ENCOUNTER — Ambulatory Visit (INDEPENDENT_AMBULATORY_CARE_PROVIDER_SITE_OTHER): Payer: Medicare Other | Admitting: Thoracic Surgery (Cardiothoracic Vascular Surgery)

## 2016-08-31 ENCOUNTER — Ambulatory Visit
Admission: RE | Admit: 2016-08-31 | Discharge: 2016-08-31 | Disposition: A | Discharge status: Home / Self Care | Payer: Medicare Other | Source: Ambulatory Visit | Attending: Thoracic Surgery (Cardiothoracic Vascular Surgery) | Admitting: Thoracic Surgery (Cardiothoracic Vascular Surgery)

## 2016-08-31 VITALS — BP 119/78 | HR 80 | Resp 20 | Ht 68.0 in | Wt 215.0 lb

## 2016-08-31 DIAGNOSIS — I712 Thoracic aortic aneurysm, without rupture: Secondary | ICD-10-CM

## 2016-08-31 DIAGNOSIS — I7121 Aneurysm of the ascending aorta, without rupture: Secondary | ICD-10-CM

## 2016-08-31 DIAGNOSIS — I714 Abdominal aortic aneurysm, without rupture: Secondary | ICD-10-CM | POA: Diagnosis not present

## 2016-08-31 MED ORDER — IOPAMIDOL (ISOVUE-370) INJECTION 76%
75.0000 mL | Freq: Once | INTRAVENOUS | Status: AC | PRN
  Administered 2016-08-31: 75 mL via INTRAVENOUS

## 2016-08-31 NOTE — Progress Notes (Signed)
[address]       [place] [address]             [phone]    HPI: [name] returns for follow up of her ascending aneurysm  She is an 83 year old woman with a history of breast cancer, type 2 diabetes, hypertension, heart murmur, hyperlipidemia, osteoarthritis, and gastroesophageal reflux. She was hospitalized in June 2017 with DVT and pulmonary emboli. A CT angiogram showed a 4 cm ascending aortic aneurysm. I saw her in October and recommended she come back with a repeat CT at 1 year.  In the interim since I last saw her she suffered a severe leg fracture and was in bed for 4 months. She now is walking with a walker. She has not had any problems with chest pain or shortness of breath recently.  Past Medical History:  Diagnosis Date  . Arthritis   . BRCA2 positive 10/214  . Breast cancer ([place]) 1994   left sided cancer; unilateral mastectomy, no chemo or radiation  . Diabetes mellitus   . Dyslipidemia   . GERD (gastroesophageal reflux disease)   . Heart murmur   . Hypertension      Current Outpatient Prescriptions  Medication Sig Dispense Refill  . benazepril-hydrochlorthiazide (LOTENSIN HCT) 20-12.5 MG tablet TAKE 1 TABLET ONCE DAILY. 30 tablet 0  . ELIQUIS 5 MG TABS tablet TAKE 1 TABLET TWICE DAILY. 60 tablet 0  . furosemide (LASIX) 20 MG tablet Take 1 tablet (20 mg total) by mouth daily as needed for edema (if weight high by 2 pounds in 24hrs, weigh yourself daily). 30 tablet 0  . isosorbide mononitrate (IMDUR) 30 MG 24 hr tablet TAKE 1 TABLET ONCE DAILY. 30 tablet 0  . LORazepam (ATIVAN) 0.5 MG tablet TAKE (1) TABLET TWICE DAILY AS NEEDED FOR ANXIETY. 60 tablet 0  . pantoprazole (PROTONIX) 40 MG tablet TAKE 1 TABLET ONCE DAILY. 30 tablet 0  . PERCOCET 10-325 MG per tablet Take 0.25-1 tablets by mouth every 6 (six) hours as needed for pain.     . polyethylene glycol (MIRALAX / GLYCOLAX) packet Take 17 g by mouth 3 (three) times daily as  needed. (Patient taking differently: Take 17 g by mouth 3 (three) times daily as needed for moderate constipation. ) 30 each 0  . SYNTHROID 112 MCG tablet TAKE 1 TABLET EACH DAY. 30 tablet 0  . Vitamin D, Ergocalciferol, (DRISDOL) 50000 units CAPS capsule TAKE ONE CAPSULE ONCE A WEEK. 4 capsule 0  . VOLTAREN 1 % GEL Apply 2 g topically 4 (four) times daily as needed (pain). To knees    . azithromycin (ZITHROMAX) 250 MG tablet Take 2 tablets the first day than 1 tablet for the next 4 days (Patient not taking: Reported on 07/13/2016) 6 tablet 0  . chlorpheniramine-HYDROcodone (TUSSIONEX PENNKINETIC ER) 10-8 MG/5ML SUER Take 5 mLs by mouth 2 (two) times daily.    . chlorpheniramine-HYDROcodone (TUSSIONEX PENNKINETIC ER) 10-8 MG/5ML SUER Take 5 mLs by mouth 2 (two) times daily. (Patient not taking: Reported on 08/31/2016) 60 mL 0   No current facility-administered medications for this visit.     Physical Exam BP 119/78   Pulse 80   Resp 20   Ht _0  (1.727 m)   Wt 215 lb (97.5 kg)   SpO2 92% Comment: RA  BMI 32.61 kg/m  83 year old woman in no acute distress Alert and 3 with no focal deficits Lungs clear equal breath sounds bilaterally  Cardiac regular rate and rhythm with a 2/6 systolic murmur  Diagnostic Tests: CT ANGIOGRAPHY CHEST WITH CONTRAST  TECHNIQUE: Multidetector CT imaging of the chest was performed using the standard protocol during bolus administration of intravenous contrast. Multiplanar CT image reconstructions and MIPs were obtained to evaluate the vascular anatomy.  CONTRAST:  75 cc Isovue 370  COMPARISON:  08/30/2015  FINDINGS: Cardiovascular: Maximal diameter of the ascending aorta at the sinus of all cell vial, sino-tubular junction, and ascending aorta are 3.7 cm, 3.3 cm, and 4.0 cm. Previously, maximal diameter of the ascending aorta was 4.0 cm. This is stable. There is no evidence of intramural hematoma. There is no evidence of dissection.  Great  vessels are patent. Vertebral arteries are also patent within the confines of the exam. The left is dominant.  Mild LAD coronary artery calcification.  Previously visualized pulmonary thromboembolism is no longer appreciated. Contrast was optimized for the systemic arterial system.  Mediastinum/Nodes: No abnormal mediastinal adenopathy or pericardial effusion. Thyroid gland is atrophic.  Lungs/Pleura: No pneumothorax.  No pleural effusion.  Dependent atelectasis in the lungs.  Upper Abdomen: No acute abnormality.  Musculoskeletal: Chronic left rib deformities. No definite acute rib fracture. Status post T12 vertebroplasty. Stable T9 compression deformity.  Review of the MIP images confirms the above findings.  IMPRESSION: Stable aneurysmal dilatation of the ascending aorta at 4.0 cm. Recommend annual imaging followup by CTA or MRA. This recommendation follows 2010 ACCF/AHA/AATS/ACR/ASA/SCA/SCAI/SIR/STS/SVM Guidelines for the Diagnosis and Management of Patients with Thoracic Aortic Disease. Circulation. 2010; 121: Y333-O329  Filling defects in the pulmonary arterial tree have resolved.  Stable thoracic spine.   Electronically Signed   By: Marybelle Killings M.D.   On: 08/31/2016 11:40 I personally reviewed the CT chest and concur with the findings noted above  Impression: 83 year old woman who was incidentally found to have a 4 cm ascending aortic aneurysm on a echocardiogram from a year ago while she was being evaluated for pulmonary emboli.  Ascending aneurysm- unchanged from a year ago on today's scan. Continue observation with a repeat CT in one year. Blood pressure control.  Hypertension- well controlled on current regimen.  Plan: Return in one year with CT angiogram of chest  Melrose Nakayama, MD [facility] [phone]

## 2016-09-13 ENCOUNTER — Other Ambulatory Visit: Payer: Self-pay | Admitting: Endocrinology

## 2016-09-14 DIAGNOSIS — M170 Bilateral primary osteoarthritis of knee: Secondary | ICD-10-CM | POA: Diagnosis not present

## 2016-10-05 ENCOUNTER — Other Ambulatory Visit (INDEPENDENT_AMBULATORY_CARE_PROVIDER_SITE_OTHER): Payer: Medicare Other

## 2016-10-05 DIAGNOSIS — E7800 Pure hypercholesterolemia, unspecified: Secondary | ICD-10-CM | POA: Diagnosis not present

## 2016-10-05 DIAGNOSIS — R7303 Prediabetes: Secondary | ICD-10-CM

## 2016-10-05 DIAGNOSIS — E039 Hypothyroidism, unspecified: Secondary | ICD-10-CM

## 2016-10-05 DIAGNOSIS — I10 Essential (primary) hypertension: Secondary | ICD-10-CM

## 2016-10-05 DIAGNOSIS — D72829 Elevated white blood cell count, unspecified: Secondary | ICD-10-CM | POA: Diagnosis not present

## 2016-10-05 LAB — COMPREHENSIVE METABOLIC PANEL
ALT: 14 U/L (ref 0–35)
AST: 14 U/L (ref 0–37)
Albumin: 3.7 g/dL (ref 3.5–5.2)
Alkaline Phosphatase: 101 U/L (ref 39–117)
BUN: 14 mg/dL (ref 6–23)
CO2: 30 meq/L (ref 19–32)
Calcium: 9.6 mg/dL (ref 8.4–10.5)
Chloride: 100 mEq/L (ref 96–112)
Creatinine, Ser: 0.62 mg/dL (ref 0.40–1.20)
GFR: 97.87 mL/min (ref >60.00)
GLUCOSE: 125 mg/dL — AB (ref 70–99)
POTASSIUM: 3.7 meq/L (ref 3.5–5.1)
Sodium: 139 mEq/L (ref 135–145)
Total Bilirubin: 0.9 mg/dL (ref 0.2–1.2)
Total Protein: 6.3 g/dL (ref 6.0–8.3)

## 2016-10-05 LAB — LIPID PANEL
CHOL/HDL RATIO: 4
Cholesterol: 241 mg/dL — ABNORMAL HIGH (ref 0–200)
HDL: 54 mg/dL (ref >39.00)
LDL Cholesterol: 169 mg/dL — ABNORMAL HIGH (ref 0–99)
NONHDL: 187.47
Triglycerides: 90 mg/dL (ref 0.0–149.0)
VLDL: 18 mg/dL (ref 0.0–40.0)

## 2016-10-05 LAB — CBC
HEMATOCRIT: 44.6 % (ref 36.0–46.0)
Hemoglobin: 15 g/dL (ref 12.0–15.0)
MCHC: 33.6 g/dL (ref 30.0–36.0)
MCV: 90.4 fl (ref 78.0–100.0)
Platelets: 243 10*3/uL (ref 150.0–400.0)
RBC: 4.93 Mil/uL (ref 3.87–5.11)
RDW: 13.3 % (ref 11.5–15.5)
WBC: 12.6 10*3/uL — ABNORMAL HIGH (ref 4.0–10.5)

## 2016-10-05 LAB — HEMOGLOBIN A1C: HEMOGLOBIN A1C: 5.8 % (ref 4.6–6.5)

## 2016-10-05 LAB — TSH: TSH: 1.27 u[IU]/mL (ref 0.35–4.50)

## 2016-10-05 LAB — T4, FREE: Free T4: 1.3 ng/dL (ref 0.60–1.60)

## 2016-10-12 ENCOUNTER — Other Ambulatory Visit: Payer: Self-pay | Admitting: Endocrinology

## 2016-10-12 ENCOUNTER — Encounter: Payer: Self-pay | Admitting: Endocrinology

## 2016-10-12 ENCOUNTER — Ambulatory Visit (INDEPENDENT_AMBULATORY_CARE_PROVIDER_SITE_OTHER): Payer: Medicare Other | Admitting: Endocrinology

## 2016-10-12 VITALS — BP 120/84 | HR 89 | Ht 68.0 in

## 2016-10-12 DIAGNOSIS — D72829 Elevated white blood cell count, unspecified: Secondary | ICD-10-CM | POA: Diagnosis not present

## 2016-10-12 DIAGNOSIS — E063 Autoimmune thyroiditis: Secondary | ICD-10-CM

## 2016-10-12 DIAGNOSIS — R7303 Prediabetes: Secondary | ICD-10-CM

## 2016-10-12 DIAGNOSIS — Z23 Encounter for immunization: Secondary | ICD-10-CM | POA: Diagnosis not present

## 2016-10-12 DIAGNOSIS — E7800 Pure hypercholesterolemia, unspecified: Secondary | ICD-10-CM | POA: Diagnosis not present

## 2016-10-12 DIAGNOSIS — M5030 Other cervical disc degeneration, unspecified cervical region: Secondary | ICD-10-CM | POA: Diagnosis not present

## 2016-10-12 DIAGNOSIS — M25562 Pain in left knee: Secondary | ICD-10-CM | POA: Diagnosis not present

## 2016-10-12 DIAGNOSIS — I10 Essential (primary) hypertension: Secondary | ICD-10-CM

## 2016-10-12 DIAGNOSIS — M25561 Pain in right knee: Secondary | ICD-10-CM | POA: Diagnosis not present

## 2016-10-12 NOTE — Patient Instructions (Signed)
Check with Dr Lynann Bologna for neck arthritis

## 2016-10-12 NOTE — Progress Notes (Signed)
Subjective:     Patient ID: Angela [name], female   DOB: 01-03-1933, 83 y.o.   MRN: [id number]  HPI  Chief complaint: follow-up of various problems   Hypertension:  She has had long-standing hypertension which has been well controlled.  Has been consistently on Lotensin HCT 20/12.5 Does not monitor at home Blood pressure slightly higher today but she is anxious  She has had previous edema and takes Lasix as needed   BP Readings from Last 3 Encounters:  10/12/16 120/84  08/31/16 119/78  07/13/16 138/76     PREDIABETES: She has had long-standing impaired fasting glucose  However A1c has been consistently normal Below 6%  Not on any metformin for quite some time Postprandial glucose 125 in the morning She says she is still checking her sugars occasionally at home and fasting readings are mostly below 100   Lab Results  Component Value Date   HGBA1C 5.8 10/05/2016   HGBA1C 5.7 04/13/2016   HGBA1C 5.9 11/04/2015   Lab Results  Component Value Date   MICROALBUR 0.4 12/05/2012   LDLCALC 169 (H) 10/05/2016   CREATININE 0.62 10/05/2016    PAIN in various extremity areas: She says she tends to get pain in her arms and she has some stiffness and restriction of movement especially on the left arm She says that if she is holding her arms so certain way she may feel an numbness in the left arm also Also she said that for quite some time if she is overextending her neck backwards she may feel a little dizzy and avoids doing this   OTHER active problems are detailed in review of systems:   Allergies as of 10/12/2016      Reactions   Gadolinium     Desc: pt states she had nausea after receiving MAGNEVIST for breast imaging   Iodinated Diagnostic Agents Other (See Comments)   Unknown reaction Pt states that she is not allergic. ryr 08-26-16//pt did fine on 08/31/16 w/o premedication for CTA chest, J Bohm//   Pravastatin Other (See Comments)   Locked jaw,  weakness   Simvastatin Other (See Comments)   Weakness and locked jaw.   Crestor [rosuvastatin Calcium] Other (See Comments)   Abdominal discomfort   Penicillins Swelling, Rash   Has patient had a PCN reaction causing immediate rash, facial/tongue/throat swelling, SOB or lightheadedness with hypotension: yes Has patient had a PCN reaction causing severe rash involving mucus membranes or skin necrosis: unknown Has patient had a PCN reaction that required hospitalization: unknown Has patient had a PCN reaction occurring within the last 10 years: no If all of the above answers are "NO", then may proceed with Cephalosporin use.      Medication List       Accurate as of 10/12/16 11:27 AM. Always use your most recent med list.          azithromycin 250 MG tablet Commonly known as:  ZITHROMAX Take 2 tablets the first day than 1 tablet for the next 4 days   benazepril-hydrochlorthiazide 20-12.5 MG tablet Commonly known as:  LOTENSIN HCT TAKE 1 TABLET ONCE DAILY.   chlorpheniramine-HYDROcodone 10-8 MG/5ML Suer Commonly known as:  TUSSIONEX PENNKINETIC ER Take 5 mLs by mouth 2 (two) times daily.   ELIQUIS 5 MG Tabs tablet Generic drug:  apixaban TAKE 1 TABLET TWICE DAILY.   furosemide 20 MG tablet Commonly known as:  LASIX Take 1 tablet (20 mg total) by mouth daily as needed for  edema (if weight high by 2 pounds in 24hrs, weigh yourself daily).   isosorbide mononitrate 30 MG 24 hr tablet Commonly known as:  IMDUR TAKE 1 TABLET ONCE DAILY.   LORazepam 0.5 MG tablet Commonly known as:  ATIVAN TAKE (1) TABLET TWICE DAILY AS NEEDED FOR ANXIETY.   pantoprazole 40 MG tablet Commonly known as:  PROTONIX TAKE 1 TABLET ONCE DAILY.   PERCOCET 10-325 MG tablet Generic drug:  oxyCODONE-acetaminophen Take 0.25-1 tablets by mouth every 6 (six) hours as needed for pain.   polyethylene glycol packet Commonly known as:  MIRALAX / GLYCOLAX Take 17 g by mouth 3 (three) times daily as  needed.   SYNTHROID 112 MCG tablet Generic drug:  levothyroxine TAKE 1 TABLET EACH DAY.   Vitamin D (Ergocalciferol) 50000 units Caps capsule Commonly known as:  DRISDOL TAKE ONE CAPSULE ONCE A WEEK.   VOLTAREN 1 % Gel Generic drug:  diclofenac sodium Apply 2 g topically 4 (four) times daily as needed (pain). To knees       Review of Systems    Primary hypothyroidism: She has had long-standing primary hypothyroidism.   Has been consistently  taking 112 g levothyroxine since 03/2016  She has been taking her medication regularly in the morning  Does not think she has any unusual fatigue or cold intolerance  She thinks her weight is stable, as before will not allow her weight to be taken    Lab Results  Component Value Date   TSH 1.27 10/05/2016   TSH 1.42 07/06/2016   TSH 7.91 (H) 04/13/2016   FREET4 1.30 10/05/2016   FREET4 1.21 07/06/2016   FREET4 1.10 04/13/2016    HYPERLIPIDEMIA: Again not on treatment She refuses to take any medications as she thinks medication may make her have pains, cramps, hair loss or various other possible side effects She thinks she is watching her diet  LDL is now higher  than usual  Lab Results  Component Value Date   CHOL 241 (H) 10/05/2016   CHOL 210 (H) 07/06/2016   CHOL 235 (H) 04/13/2016   Lab Results  Component Value Date   HDL 54.00 10/05/2016   HDL 48.10 07/06/2016   HDL 57.90 04/13/2016   Lab Results  Component Value Date   LDLCALC 169 (H) 10/05/2016   LDLCALC 144 (H) 07/06/2016   LDLCALC 159 (H) 04/13/2016   Lab Results  Component Value Date   TRIG 90.0 10/05/2016   TRIG 91.0 07/06/2016   TRIG 93.0 04/13/2016   Lab Results  Component Value Date   CHOLHDL 4 10/05/2016   CHOLHDL 4 07/06/2016   CHOLHDL 4 04/13/2016   Lab Results  Component Value Date   LDLDIRECT 193.5 02/07/2013   LDLDIRECT 189.5 12/11/2012   LDLDIRECT 201.3 12/05/2012     LEUKOCYTOSIS:  She has persistently high white blood cell  count without abnormal morphology, white count may be much higher if she has an infection Periodically followed by hematologist  Lab Results  Component Value Date   WBC 12.6 (H) 10/05/2016   WBC 21.5 Repeated and verified X2. (HH) 07/06/2016   WBC 10.8 (H) 06/22/2016   WBC 10.9 (H) 03/18/2016         Objective:   Physical Exam  BP 120/84   Pulse 89   Ht 5\' 8"  (1.727 m)   SpO2 96%   Exam not indicated     Assessment:      HYPERTENSION:  well controlled,  slightly higher diastolic today but she  is anxious For now she will continue the same dosage Of Lotensin HCT Electrolytes have been stable  HYPOTHYROIDISM:  TSH is stable with the 112 dose  History of hyperlipidemia: Since she has no other risk factors and LDL is below 160 will continue on diet alone  History of prediabetes: A1c again below 6%, she has normal fasting blood sugars at home and postprandial reading 125 Given her age this is adequate control, she understands the need for weight loss   LEUKOCYTOSIS: This is idiopathic and followed by hematologist, again white blood cells of mildly increased  Pain in upper extremities with ?  Frozen shoulder on the left and radiculopathy: She will need to discuss this with her orthopedic surgeon at her visit today, likely needs physical therapy  Preventive care: She apparently has had Prevnar but this she thinks has not been documented, last Pneumovax 2009  Idiopathic muscle cramps: This is unlikely to be related to medications, she says she is relieved with mustard and can continue     Plan:       No change in thyroid dosage  Continue to monitor lipids  Other recommendations as above  She will need to have longer hours with her nursing aide since she has decreased mobility both in upper and lower extremities  Discussed need for Pneumovax booster given her history of periodic bronchitis, history of pulmonary embolus  and this was given  Total visit time for  evaluation and management of multiple problems, review of medications, labs, counseling on above subjects = 25 minutes  Follow-up in 4 months      [facility] 10/12/16

## 2016-10-14 ENCOUNTER — Telehealth: Payer: Self-pay | Admitting: Endocrinology

## 2016-10-14 NOTE — Telephone Encounter (Signed)
Patient "requesting a call from Lattie Haw only to discuss something personal." Call patient to advise.

## 2016-10-14 NOTE — Telephone Encounter (Signed)
Spoke to the patient and she just wanted to let me know the time frame for her care she needs is 4-6 in the evening

## 2016-10-14 NOTE — Telephone Encounter (Signed)
I have written for extra 2 hours in the afternoon

## 2016-10-15 ENCOUNTER — Telehealth: Payer: Self-pay

## 2016-10-15 NOTE — Telephone Encounter (Signed)
Called patient and let her know that Dr. Dwyane Dee had filled out the form for her to get extra help in the evenings. She stated that she will have Nadine pick it up on Monday.

## 2016-10-15 NOTE — Telephone Encounter (Signed)
Spoke with the patient and she stated an understanding she will come pick up on Monday

## 2016-10-19 ENCOUNTER — Ambulatory Visit
Admission: RE | Admit: 2016-10-19 | Discharge: 2016-10-19 | Disposition: A | Discharge status: Home / Self Care | Payer: Medicare Other | Source: Ambulatory Visit | Attending: Obstetrics & Gynecology | Admitting: Obstetrics & Gynecology

## 2016-10-19 DIAGNOSIS — N644 Mastodynia: Secondary | ICD-10-CM | POA: Diagnosis not present

## 2016-10-19 DIAGNOSIS — Z853 Personal history of malignant neoplasm of breast: Secondary | ICD-10-CM

## 2016-10-19 MED ORDER — GADOBENATE DIMEGLUMINE 529 MG/ML IV SOLN
20.0000 mL | Freq: Once | INTRAVENOUS | Status: AC | PRN
  Administered 2016-10-19: 20 mL via INTRAVENOUS

## 2016-10-26 DIAGNOSIS — L720 Epidermal cyst: Secondary | ICD-10-CM | POA: Diagnosis not present

## 2016-10-26 DIAGNOSIS — S2242XA Multiple fractures of ribs, left side, initial encounter for closed fracture: Secondary | ICD-10-CM | POA: Diagnosis not present

## 2016-10-26 DIAGNOSIS — L821 Other seborrheic keratosis: Secondary | ICD-10-CM | POA: Diagnosis not present

## 2016-10-26 DIAGNOSIS — L718 Other rosacea: Secondary | ICD-10-CM | POA: Diagnosis not present

## 2016-11-09 ENCOUNTER — Ambulatory Visit: Payer: Medicare Other | Admitting: Endocrinology

## 2016-11-09 DIAGNOSIS — M170 Bilateral primary osteoarthritis of knee: Secondary | ICD-10-CM | POA: Diagnosis not present

## 2016-11-09 DIAGNOSIS — R0781 Pleurodynia: Secondary | ICD-10-CM | POA: Diagnosis not present

## 2016-11-11 ENCOUNTER — Other Ambulatory Visit: Payer: Self-pay | Admitting: Endocrinology

## 2016-11-15 ENCOUNTER — Other Ambulatory Visit: Payer: Self-pay

## 2016-11-15 MED ORDER — BENAZEPRIL-HYDROCHLOROTHIAZIDE 20-12.5 MG PO TABS: 1.0000 | ORAL_TABLET | Freq: Every day | ORAL | 1 refills | Status: DC

## 2016-11-16 DIAGNOSIS — H0012 Chalazion right lower eyelid: Secondary | ICD-10-CM | POA: Diagnosis not present

## 2016-11-29 ENCOUNTER — Telehealth: Payer: Self-pay | Admitting: Endocrinology

## 2016-11-29 NOTE — Telephone Encounter (Signed)
Patient would like a call back about forms she dropped off?  Please advise,  Ty,  -LL

## 2016-11-30 ENCOUNTER — Telehealth: Payer: Self-pay

## 2016-11-30 NOTE — Telephone Encounter (Signed)
[name] nurse came into the office today and stated that the paperwork she dropped off does not need to be filled out because the patient is already in their system. What she needs for you to do is call [phone] and let [employer] know that the patient needs 2 additional hours due to the change in her medical condition. Patient never got the additional hours that you signed off on before because the company closed down. The patient has suffered 2 broken ribs and is in great need of help- please advise

## 2016-11-30 NOTE — Telephone Encounter (Signed)
This has been [employer] re-faxed the forms, I completed them, have faxed them back, and the patient has been notified

## 2016-11-30 NOTE — Telephone Encounter (Signed)
Spoke with the patients nurse and I called [employer] to get them to re-fax paperwork so that I can make the necessary changes and send it back so that she can get the extra 2 hours of help at home from the nurse

## 2016-11-30 NOTE — Telephone Encounter (Signed)
Please approve this on my behalf

## 2016-12-01 NOTE — Telephone Encounter (Signed)
This paperwork has been re-faxed

## 2016-12-07 DIAGNOSIS — M170 Bilateral primary osteoarthritis of knee: Secondary | ICD-10-CM | POA: Diagnosis not present

## 2016-12-09 ENCOUNTER — Other Ambulatory Visit: Payer: Self-pay | Admitting: Endocrinology

## 2016-12-23 ENCOUNTER — Ambulatory Visit (HOSPITAL_BASED_OUTPATIENT_CLINIC_OR_DEPARTMENT_OTHER): Payer: Medicare Other | Admitting: Oncology

## 2016-12-23 ENCOUNTER — Other Ambulatory Visit (HOSPITAL_BASED_OUTPATIENT_CLINIC_OR_DEPARTMENT_OTHER): Payer: Medicare Other

## 2016-12-23 VITALS — BP 151/68 | HR 79 | Temp 97.6°F | Resp 17 | Ht 68.0 in

## 2016-12-23 DIAGNOSIS — Z853 Personal history of malignant neoplasm of breast: Secondary | ICD-10-CM | POA: Diagnosis not present

## 2016-12-23 DIAGNOSIS — D72829 Elevated white blood cell count, unspecified: Secondary | ICD-10-CM

## 2016-12-23 DIAGNOSIS — Z7901 Long term (current) use of anticoagulants: Secondary | ICD-10-CM

## 2016-12-23 DIAGNOSIS — Z86711 Personal history of pulmonary embolism: Secondary | ICD-10-CM

## 2016-12-23 LAB — CBC WITH DIFFERENTIAL/PLATELET
BASO%: 0.1 % (ref 0.0–2.0)
BASOS ABS: 0 10*3/uL (ref 0.0–0.1)
EOS%: 1.1 % (ref 0.0–7.0)
Eosinophils Absolute: 0.2 10*3/uL (ref 0.0–0.5)
HCT: 42.8 % (ref 34.8–46.6)
HGB: 14.5 g/dL (ref 11.6–15.9)
LYMPH%: 21.1 % (ref 14.0–49.7)
MCH: 30.6 pg (ref 25.1–34.0)
MCHC: 33.9 g/dL (ref 31.5–36.0)
MCV: 90.3 fL (ref 79.5–101.0)
MONO#: 1.5 10*3/uL — ABNORMAL HIGH (ref 0.1–0.9)
MONO%: 9.3 % (ref 0.0–14.0)
NEUT#: 11.1 10*3/uL — ABNORMAL HIGH (ref 1.5–6.5)
NEUT%: 68.4 % (ref 38.4–76.8)
Platelets: 252 10*3/uL (ref 145–400)
RBC: 4.74 10*6/uL (ref 3.70–5.45)
RDW: 13.7 % (ref 11.2–14.5)
WBC: 16.2 10*3/uL — ABNORMAL HIGH (ref 3.9–10.3)
lymph#: 3.4 10*3/uL — ABNORMAL HIGH (ref 0.9–3.3)

## 2016-12-23 NOTE — Progress Notes (Signed)
  [place] OFFICE PROGRESS NOTE   Diagnosis: Neutrophilia, history of breast cancer  INTERVAL HISTORY:   [name] returns for scheduled visit. She feels well. She reports seeing Dr. Nori [name] for a GYN exam after the last visit here. She does not want to have the ovaries removed. A bilateral breast MRI 10/20/1998 813 revealed no evidence of malignancy. She reports fracturing several ribs at the MRI procedure.    Objective:  Vital signs in last 24 hours:  Blood pressure (!) 151/68, pulse 79, temperature 97.6 F (36.4 C), temperature source Oral, resp. rate 17, height '5\' 8"'$  (1.727 m), SpO2 99 %.    HEENT: Neck without mass Lymphatics: No cervical, supraclavicular, axillary, or inguinal nodes Resp: Lungs clear bilaterally Cardio: Regular rate and rhythm GI: No hepatosplenomegaly, no mass, nontender Vascular: Trace edema at the right greater than left lower leg with bilateral venous varicosities Breasts: Status post left mastectomy with an implant in place. Implant at the right breast. No evidence for chest wall tumor recurrence. Right breast without mass.     Lab Results:  Lab Results  Component Value Date   WBC 16.2 (H) 12/23/2016   HGB 14.5 12/23/2016   HCT 42.8 12/23/2016   MCV 90.3 12/23/2016   PLT 252 12/23/2016   NEUTROABS 11.1 (H) 12/23/2016    Medications: I have reviewed the patient's current medications.  Assessment/Plan: 1. Leukocytosis-mild neutrophilia 2. Knee arthritis 3. BRCA2 carrier 4. Remote history of breast cancer 5. Hypothyroidism 6. Hyperlipidemia 7. Family history of multiple cancers 8. Bilateral pulmonary embolism June 2017-maintained on Apixaban  Disposition:  [name] appears stable. She has chronic mild leukocytosis of unclear etiology. There is no clinical evidence for development of a myeloproliferative or lymphoproliferative disorder. She will return for an office visit and CBC in 6 months.  We discussed the  increased risk of ovarian cancer. She does not wish to consider an oophorectomy procedure.    Angela Romberg, MD  12/23/2016  12:48 PM

## 2017-01-04 DIAGNOSIS — M940 Chondrocostal junction syndrome [Tietze]: Secondary | ICD-10-CM | POA: Diagnosis not present

## 2017-01-04 DIAGNOSIS — M170 Bilateral primary osteoarthritis of knee: Secondary | ICD-10-CM | POA: Diagnosis not present

## 2017-01-05 ENCOUNTER — Telehealth: Payer: Self-pay | Admitting: Endocrinology

## 2017-01-05 NOTE — Telephone Encounter (Signed)
Called patient and she stated that she has inflammation in her chest and she was given Mobic for this. She stated that she can hardly move her chest and back and is asking if she needs antibiotics? She stated that you should be able to see her xray. Please advise.

## 2017-01-05 NOTE — Telephone Encounter (Signed)
She can try taking Mucinex DM.  We can see her if she is not having relief of her pain

## 2017-01-05 NOTE — Telephone Encounter (Signed)
I do not see any notes about her being given Mobic.  There is no recent chest x-ray.  She does not need antibiotics unless she is having fever with yellow sputum

## 2017-01-05 NOTE — Telephone Encounter (Signed)
Patient ask you to please give her call today, she thinks has a chest infection.

## 2017-01-05 NOTE — Telephone Encounter (Signed)
Called patient and she stated that she saw Dr. Lynann Bologna at [facility] and she stated that she has 2 broken ribs on her left side. She stated that she has had a lot of clear mucous coming up.  Please advise.

## 2017-01-06 ENCOUNTER — Telehealth: Payer: Self-pay

## 2017-01-06 ENCOUNTER — Other Ambulatory Visit: Payer: Self-pay | Admitting: Endocrinology

## 2017-01-06 MED ORDER — AZITHROMYCIN 250 MG PO TABS: ORAL_TABLET | ORAL | 0 refills | Status: DC

## 2017-01-06 MED ORDER — HYDROCOD POLST-CPM POLST ER 10-8 MG/5ML PO SUER: 5.0000 mL | Freq: Two times a day (BID) | ORAL | 0 refills | Status: DC | PRN

## 2017-01-06 NOTE — Telephone Encounter (Signed)
I am sending the Zithromax, she will continue Mucinex DM

## 2017-01-06 NOTE — Telephone Encounter (Signed)
Called patient and she is not able to come to our office yet. She is feeling much worse and said she is very congested in her chest. She is asking for a Zpak. She stated that this is what she needs to help her clear this up. She also needs cough syrup. She is taking the Mucinex DM and her mucous is brownish now and she is not able to get the mucous up now. She is getting worse. Please advise.

## 2017-01-06 NOTE — Telephone Encounter (Signed)
Called patient and let her know that I have faxed over her Tussin cough syrup and she stated that she already started taking her Zpak. She is very thankful and will call us to make an appointment as soon as she thinks she can come.

## 2017-01-06 NOTE — Telephone Encounter (Signed)
Pt called asking to speak with you. She said that she not doing well this morning. She can be reached at [phone]. Thanks!

## 2017-01-06 NOTE — Telephone Encounter (Signed)
Prescription is printed

## 2017-01-06 NOTE — Telephone Encounter (Signed)
Patient is also asking for the Tussin Cough medication that she has received before. She stated she would like to have before the weekend just in case because she is feeling worse now than before.  Please advise.

## 2017-01-06 NOTE — Telephone Encounter (Signed)
Called patient and patient stated she will call back to make an appointment when she is able to come in.

## 2017-01-09 ENCOUNTER — Emergency Department (HOSPITAL_COMMUNITY): Payer: Medicare Other

## 2017-01-09 ENCOUNTER — Encounter (HOSPITAL_COMMUNITY): Payer: Self-pay

## 2017-01-09 ENCOUNTER — Emergency Department (HOSPITAL_COMMUNITY)
Admission: EM | Admit: 2017-01-09 | Discharge: 2017-01-09 | Disposition: A | Discharge status: Home / Self Care | Payer: Medicare Other | Attending: Emergency Medicine | Admitting: Emergency Medicine

## 2017-01-09 DIAGNOSIS — M549 Dorsalgia, unspecified: Secondary | ICD-10-CM | POA: Diagnosis not present

## 2017-01-09 DIAGNOSIS — X58XXXA Exposure to other specified factors, initial encounter: Secondary | ICD-10-CM | POA: Diagnosis not present

## 2017-01-09 DIAGNOSIS — I10 Essential (primary) hypertension: Secondary | ICD-10-CM | POA: Diagnosis not present

## 2017-01-09 DIAGNOSIS — S2242XA Multiple fractures of ribs, left side, initial encounter for closed fracture: Secondary | ICD-10-CM

## 2017-01-09 DIAGNOSIS — E119 Type 2 diabetes mellitus without complications: Secondary | ICD-10-CM | POA: Diagnosis not present

## 2017-01-09 DIAGNOSIS — Z79899 Other long term (current) drug therapy: Secondary | ICD-10-CM | POA: Insufficient documentation

## 2017-01-09 DIAGNOSIS — R05 Cough: Secondary | ICD-10-CM | POA: Insufficient documentation

## 2017-01-09 DIAGNOSIS — Z86711 Personal history of pulmonary embolism: Secondary | ICD-10-CM | POA: Diagnosis not present

## 2017-01-09 DIAGNOSIS — S22000A Wedge compression fracture of unspecified thoracic vertebra, initial encounter for closed fracture: Secondary | ICD-10-CM

## 2017-01-09 DIAGNOSIS — R079 Chest pain, unspecified: Secondary | ICD-10-CM

## 2017-01-09 DIAGNOSIS — M546 Pain in thoracic spine: Secondary | ICD-10-CM | POA: Diagnosis not present

## 2017-01-09 DIAGNOSIS — Y999 Unspecified external cause status: Secondary | ICD-10-CM | POA: Diagnosis not present

## 2017-01-09 DIAGNOSIS — Y939 Activity, unspecified: Secondary | ICD-10-CM | POA: Insufficient documentation

## 2017-01-09 DIAGNOSIS — Z853 Personal history of malignant neoplasm of breast: Secondary | ICD-10-CM | POA: Insufficient documentation

## 2017-01-09 DIAGNOSIS — E039 Hypothyroidism, unspecified: Secondary | ICD-10-CM | POA: Diagnosis not present

## 2017-01-09 DIAGNOSIS — M5489 Other dorsalgia: Secondary | ICD-10-CM | POA: Diagnosis not present

## 2017-01-09 DIAGNOSIS — R0602 Shortness of breath: Secondary | ICD-10-CM | POA: Diagnosis not present

## 2017-01-09 DIAGNOSIS — S22009A Unspecified fracture of unspecified thoracic vertebra, initial encounter for closed fracture: Secondary | ICD-10-CM | POA: Diagnosis not present

## 2017-01-09 DIAGNOSIS — Y929 Unspecified place or not applicable: Secondary | ICD-10-CM | POA: Diagnosis not present

## 2017-01-09 DIAGNOSIS — R52 Pain, unspecified: Secondary | ICD-10-CM | POA: Diagnosis not present

## 2017-01-09 LAB — CBC
HEMATOCRIT: 42.9 % (ref 36.0–46.0)
Hemoglobin: 14.8 g/dL (ref 12.0–15.0)
MCH: 30.8 pg (ref 26.0–34.0)
MCHC: 34.5 g/dL (ref 30.0–36.0)
MCV: 89.4 fL (ref 78.0–100.0)
PLATELETS: 274 10*3/uL (ref 150–400)
RBC: 4.8 MIL/uL (ref 3.87–5.11)
RDW: 13 % (ref 11.5–15.5)
WBC: 14 10*3/uL — ABNORMAL HIGH (ref 4.0–10.5)

## 2017-01-09 LAB — URINALYSIS, ROUTINE W REFLEX MICROSCOPIC
Bacteria, UA: NONE SEEN
Bilirubin Urine: NEGATIVE
GLUCOSE, UA: NEGATIVE mg/dL
Hgb urine dipstick: NEGATIVE
KETONES UR: NEGATIVE mg/dL
Nitrite: NEGATIVE
PH: 6 (ref 5.0–8.0)
Protein, ur: NEGATIVE mg/dL

## 2017-01-09 LAB — BASIC METABOLIC PANEL
ANION GAP: 8 (ref 5–15)
BUN: 11 mg/dL (ref 6–20)
CO2: 29 mmol/L (ref 22–32)
Calcium: 8.6 mg/dL — ABNORMAL LOW (ref 8.9–10.3)
Chloride: 98 mmol/L — ABNORMAL LOW (ref 101–111)
Creatinine, Ser: 0.61 mg/dL (ref 0.44–1.00)
GFR calc Af Amer: >60 mL/min (ref >60)
GLUCOSE: 104 mg/dL — AB (ref 65–99)
POTASSIUM: 3.3 mmol/L — AB (ref 3.5–5.1)
Sodium: 135 mmol/L (ref 135–145)

## 2017-01-09 LAB — PROTIME-INR
INR: 1.19
PROTHROMBIN TIME: 15 s (ref 11.4–15.2)

## 2017-01-09 LAB — BRAIN NATRIURETIC PEPTIDE: B Natriuretic Peptide: 180.8 pg/mL — ABNORMAL HIGH (ref 0.0–100.0)

## 2017-01-09 LAB — I-STAT TROPONIN, ED: Troponin i, poc: 0 ng/mL (ref 0.00–0.08)

## 2017-01-09 LAB — TROPONIN I: Troponin I: <0.03 ng/mL (ref <0.03)

## 2017-01-09 MED ORDER — IOPAMIDOL (ISOVUE-370) INJECTION 76%
INTRAVENOUS | Status: AC
  Administered 2017-01-09: 100 mL
  Filled 2017-01-09: qty 100

## 2017-01-09 MED ORDER — FENTANYL CITRATE (PF) 100 MCG/2ML IJ SOLN
50.0000 ug | Freq: Once | INTRAMUSCULAR | Status: AC
Start: 2017-01-09 — End: 2017-01-09
  Administered 2017-01-09: 50 ug via INTRAVENOUS
  Filled 2017-01-09: qty 2

## 2017-01-09 MED ORDER — FENTANYL CITRATE (PF) 100 MCG/2ML IJ SOLN
50.0000 ug | Freq: Once | INTRAMUSCULAR | Status: AC
  Administered 2017-01-09: 50 ug via INTRAVENOUS
  Filled 2017-01-09: qty 2

## 2017-01-09 MED ORDER — MORPHINE SULFATE (PF) 4 MG/ML IV SOLN
4.0000 mg | Freq: Once | INTRAVENOUS | Status: AC
  Administered 2017-01-09: 4 mg via INTRAVENOUS
  Filled 2017-01-09: qty 1

## 2017-01-09 MED ORDER — SODIUM CHLORIDE 0.9 % IV SOLN
INTRAVENOUS | Status: DC
  Administered 2017-01-09: 16:00:00 via INTRAVENOUS

## 2017-01-09 NOTE — ED Provider Notes (Signed)
[facility] EMERGENCY DEPARTMENT Provider Note   CSN: [id number] Arrival date & time: 01/09/17  1316     History   Chief Complaint Chief Complaint  Patient presents with  . Back Pain    HPI Angela [name] is a 83 y.o. female.  HPI Patient presents to the emergency room for evaluation of sharp left-sided chest pain. Patient states she started having pain in the left posterior part of her chest earlier this week. She thought she was getting a cold because she had some mild cough and congestion. She called her doctor who called in antibiotics. Patient states her symptoms have persisted and in fact of increased. She is now having more severe pain in the back of her left chest. It increases with certain positions and movement. It also increases when she takes a deep breath. She does have a history of pulmonary embolism and  Is taking liquids. She also has a history of a thoracic aortic aneurysm. She denies any vomiting or diarrhea. No dysuria. No numbness or weakness. No leg swelling. Past Medical History:  Diagnosis Date  . Arthritis   . BRCA2 positive 10/214  . Breast cancer ([place]) 1994   left sided cancer; unilateral mastectomy, no chemo or radiation  . Diabetes mellitus   . Dyslipidemia   . GERD (gastroesophageal reflux disease)   . Heart murmur   . Hypertension     Patient Active Problem List   Diagnosis Date Noted  . Ascending aortic aneurysm ([place]) 01/13/2016  . Thoracic aortic atherosclerosis ([place]) 01/13/2016  . Pulmonary embolism without acute cor pulmonale ([place])   . Pulmonary emboli ([place]) 08/30/2015  . Acute massive pulmonary embolism ([place]) 08/30/2015  . Leukocytosis 12/23/2014  . BRCA2 positive   . Hypothyroidism (acquired) 12/04/2012  . Heart murmur 09/01/2012  . Chest pain 09/17/2010  . Hypertension   . Dyslipidemia   . Prediabetes   . Arthritis   . GERD (gastroesophageal reflux disease)     Past Surgical History:  Procedure  Laterality Date  . CARDIAC CATHETERIZATION     NORMAL CORONARY ARTERIES  . CHOLECYSTECTOMY    . MASTECTOMY     LEFT BREAST  . TONSILLECTOMY      OB History    No data available       Home Medications    Prior to Admission medications   Medication Sig Start Date End Date Taking? Authorizing Provider  azithromycin (ZITHROMAX Z-PAK) 250 MG tablet 2 tablets today and then 1 daily for 4 days 01/06/17  Yes [name], MD  benazepril-hydrochlorthiazide (LOTENSIN HCT) 20-12.5 MG tablet Take 1 tablet by mouth daily. 11/15/16  Yes [name], MD  chlorpheniramine-HYDROcodone [facility] PENNKINETIC ER) 10-8 MG/5ML SUER Take 5 mLs by mouth every 12 (twelve) hours as needed for cough. 01/06/17  Yes [name], MD  ELIQUIS 5 MG TABS tablet TAKE 1 TABLET TWICE DAILY. Patient taking differently: TAKE 1 TABLET (68m) TWICE DAILY. 12/09/16  Yes KElayne Snare MD  furosemide (LASIX) 20 MG tablet Take 1 tablet (20 mg total) by mouth daily as needed for edema (if weight high by 2 pounds in 24hrs, weigh yourself daily). 04/15/16  Yes KElayne Snare MD  isosorbide mononitrate (IMDUR) 30 MG 24 hr tablet TAKE 1 TABLET ONCE DAILY. Patient taking differently: TAKE 1 TABLET (349m ONCE DAILY. 12/09/16  Yes KuElayne SnareMD  LORazepam (ATIVAN) 0.5 MG tablet TAKE (1) TABLET TWICE DAILY AS NEEDED FOR ANXIETY. Patient taking differently: TAKE 1 TABLET (0.23m71mTWICE  DAILY AS NEEDED FOR ANXIETY. 03/30/16  Yes [name], MD  meloxicam (MOBIC) 15 MG tablet Take 15 mg by mouth daily. 01/05/17  Yes [provider]  pantoprazole (PROTONIX) 40 MG tablet TAKE 1 TABLET ONCE DAILY. Patient taking differently: TAKE 1 TABLET (75m) ONCE DAILY. 12/09/16  Yes KElayne Snare MD  PERCOCET 10-325 MG per tablet Take 0.25-1 tablets by mouth every 6 (six) hours as needed for pain.  02/19/14  Yes [provider]  polyethylene glycol (MIRALAX / GLYCOLAX) packet Take 17 g by mouth 3 (three) times daily as needed. Patient taking  differently: Take 17 g by mouth 3 (three) times daily as needed for moderate constipation.  05/26/12  Yes YDrenda Freeze MD  SYNTHROID 112 MCG tablet TAKE 1 TABLET EACH DAY. Patient taking differently: TAKE 1 TABLET (1160m) EVERY MORNING BEFORE BREAKFAST. 12/09/16  Yes KuElayne SnareMD  Vitamin D, Ergocalciferol, (DRISDOL) 50000 units CAPS capsule TAKE ONE CAPSULE ONCE A WEEK. Patient taking differently: TAKE ONE CAPSULE (50,000 units) ONCE A WEEK ON FRIDAYS. 12/09/16  Yes KuElayne SnareMD  VOLTAREN 1 % GEL Apply 2 g topically 4 (four) times daily as needed (pain). To knees 02/06/14  Yes [provider]    Family History Family History  Problem Relation Age of Onset  . Heart attack Maternal Grandmother   . Pancreatic cancer Mother 6962. Bladder Cancer Father 7188     heavy smoker and drinker  . Ovarian cancer Sister 5552. Hypertension Sister   . Lung cancer Brother 5964. Ovarian cancer Maternal Aunt 62  . Stomach cancer Maternal Grandfather   . Bone cancer Paternal Grandmother        unsure if this was a primary cancer  . Stomach cancer Paternal Grandfather   . Lung cancer Maternal Aunt        died in her 8019sformer smoker  . Breast cancer Cousin        dx in her late 4035so 509spaternal cousin  . Heart disease Neg Hx     Social History Social History  Substance Use Topics  . Smoking status: Never Smoker  . Smokeless tobacco: Never Used  . Alcohol use No     Allergies   Gadolinium; Pravastatin; Simvastatin; Crestor [rosuvastatin calcium]; and Penicillins   Review of Systems Review of Systems  All other systems reviewed and are negative.    Physical Exam Updated Vital Signs BP 135/81 (BP Location: Right Arm)   Pulse 83   Temp 97.9 F (36.6 C) (Oral)   Resp 18   Ht 1.727 m ('5\' 8"' )   Wt 91.2 kg (201 lb)   SpO2 95%   BMI 30.56 kg/m   Physical Exam  Constitutional: She appears well-developed and well-nourished. No distress.  HENT:  Head:  Normocephalic and atraumatic.  Right Ear: External ear normal.  Left Ear: External ear normal.  Eyes: Conjunctivae are normal. Right eye exhibits no discharge. Left eye exhibits no discharge. No scleral icterus.  Neck: Neck supple. No tracheal deviation present.  Cardiovascular: Normal rate, regular rhythm and intact distal pulses.   Pulmonary/Chest: Effort normal and breath sounds normal. No stridor. No respiratory distress. She has no wheezes. She has no rales. She exhibits tenderness.  Tenderness palpation posterior left chest wall, no rash or deformity  Abdominal: Soft. Bowel sounds are normal. She exhibits no distension. There is no tenderness. There is no rebound and no guarding.  Musculoskeletal: She exhibits  no edema or tenderness.  Neurological: She is alert. She has normal strength. No cranial nerve deficit (no facial droop, extraocular movements intact, no slurred speech) or sensory deficit. She exhibits normal muscle tone. She displays no seizure activity. Coordination normal.  Skin: Skin is warm and dry. No rash noted.  Psychiatric: She has a normal mood and affect.  Nursing note and vitals reviewed.    ED Treatments / Results  Labs (all labs ordered are listed, but only abnormal results are displayed) Labs Reviewed  BASIC METABOLIC PANEL - Abnormal; Notable for the following:       Result Value   Potassium 3.3 (*)    Chloride 98 (*)    Glucose, Bld 104 (*)    Calcium 8.6 (*)    All other components within normal limits  CBC - Abnormal; Notable for the following:    WBC 14.0 (*)    All other components within normal limits  PROTIME-INR  BRAIN NATRIURETIC PEPTIDE  URINALYSIS, ROUTINE W REFLEX MICROSCOPIC  I-STAT TROPONIN, ED    EKG  EKG Interpretation  Date/Time:  Sunday January 09 2017 14:55:55 EDT Ventricular Rate:  86 PR Interval:  154 QRS Duration: 84 QT Interval:  390 QTC Calculation: 466 R Axis:   -40 Text Interpretation:  Sinus rhythm with Premature  atrial complexes Left axis deviation Anterior infarct , age undetermined Abnormal ECG No significant change since last tracing Confirmed by Dorie Rank [phone]) on 01/09/2017 4:23:02 PM       Radiology Dg Chest Portable 1 View  Result Date: 01/09/2017 CLINICAL DATA:  Acute shortness of breath for 2 days. EXAM: PORTABLE CHEST 1 VIEW COMPARISON:  08/31/2016 chest CT, 03/18/2016 chest radiograph and prior studies FINDINGS: The cardiomediastinal silhouette is unremarkable. Right hemidiaphragm elevation again noted. There is no evidence of focal airspace disease, pulmonary edema, suspicious pulmonary nodule/mass, pleural effusion, or pneumothorax. No acute bony abnormalities are identified. IMPRESSION: No evidence of acute cardiopulmonary disease. Electronically Signed   By: Margarette Canada M.D.   On: 01/09/2017 14:29    Procedures Procedures (including critical care time)  Medications Ordered in ED Medications  0.9 %  sodium chloride infusion ( Intravenous New Bag/Given 01/09/17 1601)  iopamidol (ISOVUE-370) 76 % injection (not administered)  morphine 4 MG/ML injection 4 mg (4 mg Intravenous Given 01/09/17 1433)     Initial Impression / Assessment and Plan / ED Course  I have reviewed the triage vital signs and the nursing notes.  Pertinent labs & imaging results that were available during my care of the patient were reviewed by me and considered in my medical decision making (see chart for details).   Pt presented to the ED with complaints of pleuritic chest pain.  Not typical for cardiac disease.  Initial EKG and labs are reassuring.  I am concerned about the pleuritic nature and her history of thoracic aorta.  Will ct to rule out dissection.  Doubt PT with her anticoagulation.  D/w Dr Sherry Ruffing who will follow up on the CT  Final Clinical Impressions(s) / ED Diagnoses  pending   Dorie Rank, MD 01/09/17 1625

## 2017-01-09 NOTE — ED Provider Notes (Signed)
4:26 PM Care assumed from Dr. Tomi Bamberger.  At time of transfer of care, patient is awaiting diagnostic testing to determine etiology of symptoms.  Patient has history of thoracic aortic aneurysm as well as pulmonary embolism.  Patient is going to have a CT to evaluate the aorta as well as a delta troponin.  Patient's initial troponin was negative.  Next  According to previous team, if patient's workup is reassuring, patient will likely be stable for discharge home.  Delta troponin was negative.  CT imaging showed no evidence of new aortic problem as well as no evidence of pneumonia.  Patient was found to have rib fractures and T-spine injury.  Patient denies recent trauma and says that these were from several months ago.  Suspect patient exacerbated her musculoskeletal pain causing her back pain.  Given reassuring workup and improvement in pain after medications, patient felt stable for discharge home.  Patient will follow-up with PCP and pain team.  Patient given incentive spirometer to encourage deep breathing in the setting of rib fractures and spine fracture.  Patient to return precautions for any new or worsening symptoms.  Patient wanted to continue her outpatient pain regimen.  Patient had no other questions or concerns and was discharged after her pain improved and she was able to tolerate eating and drinking.  Patient discharged in good condition.  Clinical Impression: 1. Acute thoracic back pain, unspecified back pain laterality   2. Chest pain, unspecified type   3. Closed fracture of multiple ribs of left side, initial encounter   4. Closed compression fracture of thoracic vertebra, initial encounter [facility])     Disposition: Discharge  Condition: Good  I have discussed the results, Dx and Tx plan with the pt(& family if present). He/she/they expressed understanding and agree(s) with the plan. Discharge instructions discussed at great length. Strict return precautions discussed and pt  &/or family have verbalized understanding of the instructions. No further questions at time of discharge.    Discharge Medication List as of 01/09/2017  9:55 PM      Follow Up: [name], MD [address] [place] [address] [place] [address] [id number] [place] [phone]  If symptoms worsen     Aubrina Nieman, Gwenyth Allegra, MD 01/10/17 [phone]

## 2017-01-09 NOTE — ED Notes (Signed)
Pt and sister are very anxious about her disposition-if she is leaving or going.  States that she is going to have a hard time getting around at home by herself.   MD aware of pts concern and will see pt soon. Pt requesting pain medication for back pain.

## 2017-01-09 NOTE — ED Notes (Signed)
Pt aware of need for urine  

## 2017-01-09 NOTE — ED Notes (Signed)
Pt sts she is unable to use the bathroom at this time.

## 2017-01-09 NOTE — Discharge Instructions (Signed)
Your workup today showed no evidence of a heart or lung cause of your pain.  We found no evidence of an aortic cause of the pain.  We did confirm the spine and rib fractures and suspect they may be contributing to your symptoms.  Please stay hydrated and use her pain medicine at home.  Please continue to take deep breaths.  Please follow-up with your primary care physician for further pain management.  If any symptoms change or worsen, please return to the nearest emergency department.

## 2017-01-09 NOTE — ED Triage Notes (Signed)
Pt from home with lower back pain on the left side that is very positional. Pt was also very hypertensive at first now it has leveled out

## 2017-01-10 ENCOUNTER — Other Ambulatory Visit: Payer: Self-pay | Admitting: Endocrinology

## 2017-01-10 NOTE — ED Notes (Signed)
Unable to waste Fentanyl 42mcg  in pyxis. Spoke with pharmacy and told to chart waste and chart RN who witnessed. Autumn, RN witnessed fentanyl 41mcg into sharps box.

## 2017-01-11 NOTE — Telephone Encounter (Signed)
Please call her

## 2017-01-12 NOTE — Telephone Encounter (Signed)
Patient was in the hospital all day Sunday and she is home now and she is not able to breath well. She stated that she still has a fever of 101. She stated that the hospital was worried about her aneurism. She stated that she will make an appointment as soon as she can. She is not able to move. Her blood pressure was 192/100 when the ambulance came to get her. She stated that her fever has broken.   She wanted to let you know what has been going on.

## 2017-01-12 NOTE — Telephone Encounter (Signed)
Noted, her chest x-ray did not show any abnormality in the emergency room.  She should come in as soon as possible or at least get a urinalysis done

## 2017-01-12 NOTE — Telephone Encounter (Signed)
Called patient and she stated that she is unable to get up or come in for an appointment at this time but she will make an appointment as soon as she feels that she will be able to come.

## 2017-02-02 ENCOUNTER — Telehealth: Payer: Self-pay

## 2017-02-02 ENCOUNTER — Telehealth: Payer: Self-pay | Admitting: Endocrinology

## 2017-02-02 NOTE — Telephone Encounter (Signed)
Patient states there is no way she can come in to the office she can not get out of bed she is in a lot of pain and she said moving around trying to get up knocks her breath out- not sure what else I can say to this patient please advise

## 2017-02-02 NOTE — Telephone Encounter (Signed)
Patient has been in hospital. Would like Megan to call her asap (this morning). She is having symptoms she wants to discuss while she has a caregiver there

## 2017-02-02 NOTE — Telephone Encounter (Signed)
I have spoken to this patient and sent a note to Dr. Dwyane Dee- I am waiting for a responce

## 2017-02-02 NOTE — Telephone Encounter (Signed)
I only see an emergency room visit about 3 weeks ago.  She has already had antibiotics and would like to see her before prescribing anything

## 2017-02-02 NOTE — Telephone Encounter (Signed)
Please find out what hospital she was in.  I do not see any records of any admission

## 2017-02-02 NOTE — Telephone Encounter (Signed)
Called patient and she stated she was in [place] hospital a week ago

## 2017-02-02 NOTE — Telephone Encounter (Signed)
Patient called this morning- she was just released from the hospital even though hospital wanted her to stay a little longer- the hospital recommended her to contact her doctor to get on a zpak or something to keep her from getting pneumonia because she has 2 broken ribs, is coughing up mucus, and she is unable to get out of bed and move around- patient stated so far mucus is not green but she feels really bad and is very congested- please advise as soon as you can

## 2017-02-03 ENCOUNTER — Other Ambulatory Visit: Payer: Self-pay

## 2017-02-03 MED ORDER — DM-GUAIFENESIN ER 30-600 MG PO TB12
1.0000 | ORAL_TABLET | Freq: Two times a day (BID) | ORAL | 2 refills | Status: DC
Start: 2017-02-03 — End: 2017-06-06

## 2017-02-03 MED ORDER — ALBUTEROL SULFATE HFA 108 (90 BASE) MCG/ACT IN AERS: 2.0000 | INHALATION_SPRAY | Freq: Four times a day (QID) | RESPIRATORY_TRACT | 2 refills | Status: DC | PRN

## 2017-02-03 MED ORDER — CALCITONIN (SALMON) 200 UNIT/ACT NA SOLN: 1.0000 | Freq: Every day | NASAL | 12 refills | Status: DC

## 2017-02-03 NOTE — Telephone Encounter (Signed)
Called patient and let her know that I am sending the medications to [facility] for her. I have sent to Dr. Dwyane Dee to [place]. She stated that she is in a lot of pain and she is taking Percocet for pain. She is also worried about getting pneumonia because she is not able to move.

## 2017-02-03 NOTE — Telephone Encounter (Signed)
I called patient and spoke with her and have responded to another message.

## 2017-02-03 NOTE — Telephone Encounter (Signed)
Patient spoke with Lattie Haw last night-wants Jinny Blossom to call her at ph# [phone]

## 2017-02-03 NOTE — Telephone Encounter (Signed)
Start Miacalcin nasal spray, one spray daily.  This will help heal her fracture.  If she is having any wheezing and adjustment she can start albuterol inhaler 2 puffs 4 times a day as needed and continue Mucinex DM

## 2017-02-07 ENCOUNTER — Other Ambulatory Visit: Payer: Self-pay | Admitting: Endocrinology

## 2017-02-07 DIAGNOSIS — S22060A Wedge compression fracture of T7-T8 vertebra, initial encounter for closed fracture: Secondary | ICD-10-CM | POA: Diagnosis not present

## 2017-02-07 DIAGNOSIS — M170 Bilateral primary osteoarthritis of knee: Secondary | ICD-10-CM | POA: Diagnosis not present

## 2017-02-08 ENCOUNTER — Telehealth: Payer: Self-pay

## 2017-02-08 ENCOUNTER — Other Ambulatory Visit: Payer: Medicare Other

## 2017-02-08 ENCOUNTER — Telehealth: Payer: Self-pay | Admitting: Endocrinology

## 2017-02-08 NOTE — Telephone Encounter (Signed)
Patient wants Angela [name] to call her at ph# [phone] asap

## 2017-02-08 NOTE — Telephone Encounter (Signed)
Spoke to the patient and this has been resolved

## 2017-02-08 NOTE — Telephone Encounter (Signed)
Patient called today to let us know that she went to see Dr. Minerva Areola and she was told that she has a crushed disk on top of the rib fractures- she wanted to let Dr. Dwyane Dee know that the albuterol, Miacalcin, and the mucinex are working great and she is finally starting to feel better. I could not see the xrays in epic but will get them faxed to our office for the doctor to view

## 2017-02-15 ENCOUNTER — Telehealth: Payer: Self-pay | Admitting: Endocrinology

## 2017-02-15 ENCOUNTER — Ambulatory Visit: Payer: Medicare Other | Admitting: Endocrinology

## 2017-02-15 NOTE — Telephone Encounter (Signed)
patient nurse father dies and wasnt able to come to her appointment, will when she come back.  Just a fyi

## 2017-02-24 ENCOUNTER — Telehealth: Payer: Self-pay

## 2017-02-24 NOTE — Telephone Encounter (Signed)
Patient called today and stated she is unable to make a f/u appointment with labs prior at this time because her nurse is out but she will try to call 03/01/17 and make these appointments- wanted [place] to know she is still healing from the broken ribs

## 2017-03-03 ENCOUNTER — Telehealth: Payer: Self-pay

## 2017-03-03 NOTE — Telephone Encounter (Signed)
Patient called and she stated that she will come to the appointment next week on 03/08/17 at 10am. She wants to have her labs done on the day of her appointment. She stated that she has been in bed for 5 weeks and she has had a hard time coming to see [place].  Just wanted you to know.

## 2017-03-08 ENCOUNTER — Ambulatory Visit (INDEPENDENT_AMBULATORY_CARE_PROVIDER_SITE_OTHER): Payer: Medicare Other | Admitting: Endocrinology

## 2017-03-08 ENCOUNTER — Encounter: Payer: Self-pay | Admitting: Endocrinology

## 2017-03-08 DIAGNOSIS — D72829 Elevated white blood cell count, unspecified: Secondary | ICD-10-CM

## 2017-03-08 DIAGNOSIS — E063 Autoimmune thyroiditis: Secondary | ICD-10-CM | POA: Diagnosis not present

## 2017-03-08 DIAGNOSIS — I10 Essential (primary) hypertension: Secondary | ICD-10-CM

## 2017-03-08 DIAGNOSIS — M545 Low back pain: Secondary | ICD-10-CM | POA: Diagnosis not present

## 2017-03-08 DIAGNOSIS — R7303 Prediabetes: Secondary | ICD-10-CM

## 2017-03-08 DIAGNOSIS — E7800 Pure hypercholesterolemia, unspecified: Secondary | ICD-10-CM

## 2017-03-08 DIAGNOSIS — M170 Bilateral primary osteoarthritis of knee: Secondary | ICD-10-CM | POA: Diagnosis not present

## 2017-03-08 DIAGNOSIS — M546 Pain in thoracic spine: Secondary | ICD-10-CM | POA: Diagnosis not present

## 2017-03-08 NOTE — Telephone Encounter (Signed)
Patient want her labs mailed to her.

## 2017-03-08 NOTE — Progress Notes (Signed)
Subjective:     Patient ID: Angela [name], female   DOB: 1932/04/14, 83 y.o.   MRN: [id number]  HPI  Chief complaint: follow-up of various problems  CHEST PAIN:  In October she started having pain in her left chest which started when she was having a respiratory infection and coughing.  She was found to have rib fractures by her orthopedic surgeon on the x-ray but reports are not available She has been on pain medications However because of persistent pain she was started in 11/18 on Miacalcin nasal spray; with this her pain is significantly improving and she is using this regularly She is going to see her orthopedic surgeon and follow-up  BACK pain: The last 3-4 days she has had lower back pain and has difficulty moving, is going to see her orthopedic surgeon today   Hypertension:  She has had long-standing hypertension which has been well controlled.  Has been consistently on Lotensin HCT 20/12.5  She has had occasional edema and takes Lasix as needed   BP Readings from Last 3 Encounters:  03/08/17 122/82  01/09/17 (!) 172/84  12/23/16 (!) 151/68     PREDIABETES: She has had long-standing impaired fasting glucose  However A1c has been consistently normal and under 6%  Not on any metformin for quite some time Has not had any labs for some time   Lab Results  Component Value Date   HGBA1C 5.8 10/05/2016   HGBA1C 5.7 04/13/2016   HGBA1C 5.9 11/04/2015   Lab Results  Component Value Date   MICROALBUR 0.4 12/05/2012   LDLCALC 169 (H) 10/05/2016   CREATININE 0.61 01/09/2017   OTHER active problems are detailed in review of systems:   Allergies as of 03/08/2017      Reactions   Gadolinium Nausea And Vomiting    pt states she had nausea after receiving MAGNEVIST for breast imaging   Pravastatin Other (See Comments)   Locked jaw, weakness   Simvastatin Other (See Comments)   Weakness and locked jaw.   Crestor [rosuvastatin Calcium] Other (See  Comments)   Abdominal discomfort   Penicillins Swelling, Rash   Has patient had a PCN reaction causing immediate rash, facial/tongue/throat swelling, SOB or lightheadedness with hypotension: yes Has patient had a PCN reaction causing severe rash involving mucus membranes or skin necrosis: unknown Has patient had a PCN reaction that required hospitalization: unknown Has patient had a PCN reaction occurring within the last 10 years: no If all of the above answers are "NO", then may proceed with Cephalosporin use.      Medication List        Accurate as of 03/08/17 10:29 AM. Always use your most recent med list.          albuterol 108 (90 Base) MCG/ACT inhaler Commonly known as:  PROVENTIL HFA;VENTOLIN HFA Inhale 2 puffs 4 (four) times daily as needed into the lungs for wheezing or shortness of breath.   azithromycin 250 MG tablet Commonly known as:  ZITHROMAX Z-PAK 2 tablets today and then 1 daily for 4 days   benazepril-hydrochlorthiazide 20-12.5 MG tablet Commonly known as:  LOTENSIN HCT Take 1 tablet by mouth daily.   calcitonin (salmon) 200 UNIT/ACT nasal spray Commonly known as:  MIACALCIN Place 1 spray daily into alternate nostrils.   chlorpheniramine-HYDROcodone 10-8 MG/5ML Suer Commonly known as:  TUSSIONEX PENNKINETIC ER Take 5 mLs by mouth every 12 (twelve) hours as needed for cough.   dextromethorphan-guaiFENesin 30-600 MG 12hr tablet  Commonly known as:  MUCINEX DM Take 1 tablet 2 (two) times daily by mouth.   ELIQUIS 5 MG Tabs tablet Generic drug:  apixaban TAKE 1 TABLET BY MOUTH TWICE DAILY.   furosemide 20 MG tablet Commonly known as:  LASIX Take 1 tablet (20 mg total) by mouth daily as needed for edema (if weight high by 2 pounds in 24hrs, weigh yourself daily).   isosorbide mononitrate 30 MG 24 hr tablet Commonly known as:  IMDUR TAKE 1 TABLET ONCE DAILY.   LORazepam 0.5 MG tablet Commonly known as:  ATIVAN TAKE (1) TABLET TWICE DAILY AS NEEDED  FOR ANXIETY.   meloxicam 15 MG tablet Commonly known as:  MOBIC Take 15 mg by mouth daily.   pantoprazole 40 MG tablet Commonly known as:  PROTONIX TAKE 1 TABLET ONCE DAILY.   PERCOCET 10-325 MG tablet Generic drug:  oxyCODONE-acetaminophen Take 0.25-1 tablets by mouth every 6 (six) hours as needed for pain.   polyethylene glycol packet Commonly known as:  MIRALAX / GLYCOLAX Take 17 g by mouth 3 (three) times daily as needed.   SYNTHROID 112 MCG tablet Generic drug:  levothyroxine TAKE 1 TABLET EACH DAY.   Vitamin D (Ergocalciferol) 50000 units Caps capsule Commonly known as:  DRISDOL TAKE ONE CAPSULE ONCE A WEEK.   VOLTAREN 1 % Gel Generic drug:  diclofenac sodium Apply 2 g topically 4 (four) times daily as needed (pain). To knees       Review of Systems    Primary hypothyroidism: She has had long-standing primary hypothyroidism.   Has been consistently  taking 112 g levothyroxine since 03/2016  She has been taking her medication regularly in the morning  Does not think she has any unusual fatigue or weight change  Last TSH was in July   Lab Results  Component Value Date   TSH 1.27 10/05/2016   TSH 1.42 07/06/2016   TSH 7.91 (H) 04/13/2016   FREET4 1.30 10/05/2016   FREET4 1.21 07/06/2016   FREET4 1.10 04/13/2016    HYPERLIPIDEMIA: not on treatment She refuses to take any medications as she thinks medication may make her have pains, cramps, hair loss or various other possible side effects She thinks she is watching her diet Her LDL fluctuates based on her diet however  Lab Results  Component Value Date   CHOL 241 (H) 10/05/2016   CHOL 210 (H) 07/06/2016   CHOL 235 (H) 04/13/2016   Lab Results  Component Value Date   HDL 54.00 10/05/2016   HDL 48.10 07/06/2016   HDL 57.90 04/13/2016   Lab Results  Component Value Date   LDLCALC 169 (H) 10/05/2016   LDLCALC 144 (H) 07/06/2016   LDLCALC 159 (H) 04/13/2016   Lab Results  Component Value  Date   TRIG 90.0 10/05/2016   TRIG 91.0 07/06/2016   TRIG 93.0 04/13/2016   Lab Results  Component Value Date   CHOLHDL 4 10/05/2016   CHOLHDL 4 07/06/2016   CHOLHDL 4 04/13/2016   Lab Results  Component Value Date   LDLDIRECT 193.5 02/07/2013   LDLDIRECT 189.5 12/11/2012   LDLDIRECT 201.3 12/05/2012     Lesion in her mouth: She is asking about lesion on the inside of her left mouth area  LEUKOCYTOSIS:  She has persistently high white blood cell count without abnormal morphology, white count may be much higher if she has an infection Periodically followed by hematologist  Lab Results  Component Value Date   WBC 14.0 (H) 01/09/2017  WBC 16.2 (H) 12/23/2016   WBC 12.6 (H) 10/05/2016   WBC 21.5 Repeated and verified X2. ([place]) 07/06/2016    Osteopenia.  Her last CT scan showed the following Vertebral augmentation at T12. A mild T7 compression deformity is new since June. A mild to moderate T9 compression deformity is not significantly changed. She is not able to lie down for her bone density which has been ordered last year      Objective:   Physical Exam  BP 122/82   Pulse (!) 105   SpO2 95%   Lungs clear with no wheezing or crepitation Hearts are normal, 2/6 ejection murmur present She has a mucous cyst on the inside of the left cheek     Assessment:      HYPERTENSION:  Fairly well controlled, despite her being in pain recently She will continue the same dosage Of Lotensin HCT  HYPOTHYROIDISM:  Needs follow-up of TSH on the 112 dose  Probable osteoporosis with rib fractures and history of vertebral fractures as seen on recent CT scan She will continue the Miacalcin but also once she is able to get a bone density may consider Forteo  History of hyperlipidemia: Needs follow-up  History of prediabetes: to be checked today      Plan:      As above She would be called after labs are done She will have x-ray reports from orthopedic surgeon forwarded  to [place]     [name] 03/08/17

## 2017-03-09 LAB — COMPREHENSIVE METABOLIC PANEL
ALK PHOS: 104 U/L (ref 39–117)
ALT: 14 U/L (ref 0–35)
AST: 14 U/L (ref 0–37)
Albumin: 3.5 g/dL (ref 3.5–5.2)
BUN: 12 mg/dL (ref 6–23)
CO2: 30 mEq/L (ref 19–32)
CREATININE: 0.44 mg/dL (ref 0.40–1.20)
Calcium: 8.6 mg/dL (ref 8.4–10.5)
Chloride: 98 mEq/L (ref 96–112)
GFR: 145.23 mL/min (ref >60.00)
GLUCOSE: 123 mg/dL — AB (ref 70–99)
POTASSIUM: 3.5 meq/L (ref 3.5–5.1)
SODIUM: 138 meq/L (ref 135–145)
TOTAL PROTEIN: 6.1 g/dL (ref 6.0–8.3)
Total Bilirubin: 1 mg/dL (ref 0.2–1.2)

## 2017-03-09 LAB — LIPID PANEL
CHOLESTEROL: 204 mg/dL — AB (ref 0–200)
HDL: 44.6 mg/dL (ref >39.00)
LDL CALC: 137 mg/dL — AB (ref 0–99)
NonHDL: 159.47
TRIGLYCERIDES: 112 mg/dL (ref 0.0–149.0)
Total CHOL/HDL Ratio: 5
VLDL: 22.4 mg/dL (ref 0.0–40.0)

## 2017-03-09 LAB — CBC
HCT: 42.9 % (ref 36.0–46.0)
Hemoglobin: 14.6 g/dL (ref 12.0–15.0)
MCHC: 34.1 g/dL (ref 30.0–36.0)
MCV: 90.8 fl (ref 78.0–100.0)
Platelets: 230 10*3/uL (ref 150.0–400.0)
RBC: 4.72 Mil/uL (ref 3.87–5.11)
RDW: 12.9 % (ref 11.5–15.5)
WBC: 10.9 10*3/uL — ABNORMAL HIGH (ref 4.0–10.5)

## 2017-03-09 LAB — TSH: TSH: 1.77 u[IU]/mL (ref 0.35–4.50)

## 2017-03-09 LAB — HEMOGLOBIN A1C: HEMOGLOBIN A1C: 5.8 % (ref 4.6–6.5)

## 2017-03-09 NOTE — Telephone Encounter (Signed)
Waiting for labs to come back so we can mail to the patient.

## 2017-03-11 ENCOUNTER — Other Ambulatory Visit: Payer: Self-pay | Admitting: Endocrinology

## 2017-03-11 NOTE — Telephone Encounter (Signed)
I have printed the lab results and I am mailing them to the patient.

## 2017-03-30 ENCOUNTER — Telehealth: Payer: Self-pay | Admitting: Endocrinology

## 2017-03-30 NOTE — Telephone Encounter (Signed)
Patient calling stated she found the paper to [facility]. Please asdvise

## 2017-03-30 NOTE — Telephone Encounter (Signed)
Left vm requesting patient call back

## 2017-03-31 NOTE — Telephone Encounter (Signed)
Pt returned your call. You were in room with a pt at the time.

## 2017-03-31 NOTE — Telephone Encounter (Signed)
Paperwork for [facility] has been faxed today and patient notified

## 2017-04-01 MED ORDER — AZITHROMYCIN 250 MG PO TABS: ORAL_TABLET | ORAL | 0 refills | Status: DC

## 2017-04-01 NOTE — Telephone Encounter (Signed)
Okay to send Z-Pak

## 2017-04-01 NOTE — Telephone Encounter (Signed)
Patient stated she has a cold,asking Dr Dwyane Dee to send a z pack to her pharmacy, she is asking for a call back when it is sent  [place], [place] [phone] (Phone) [phone] (Fax)

## 2017-04-01 NOTE — Telephone Encounter (Signed)
I contacted the patient and she advised me her sputum has been dark yellow and her temp was 101 last night. She stated this morning she did not have a fever. She has been dealing with the sputum production for the last 3 days.

## 2017-04-01 NOTE — Telephone Encounter (Signed)
Z-Pack ordered and patient notified and had no further questions at this time.

## 2017-04-01 NOTE — Telephone Encounter (Signed)
Please find out exact symptoms including any discolored sputum or fever.  If not having any of these she just needs OTC medications for a viral infection

## 2017-04-04 NOTE — Telephone Encounter (Signed)
Patient stated that she coughed all night, asking if Dr Dwyane Dee will call her in some cough medicine to her pharmacy.  Send to Pharmacy:  [place], [place] [phone] (Phone) [phone] (Fax)

## 2017-04-05 ENCOUNTER — Other Ambulatory Visit: Payer: Self-pay | Admitting: Endocrinology

## 2017-04-05 DIAGNOSIS — M545 Low back pain: Secondary | ICD-10-CM | POA: Diagnosis not present

## 2017-04-05 DIAGNOSIS — H0012 Chalazion right lower eyelid: Secondary | ICD-10-CM | POA: Diagnosis not present

## 2017-04-05 DIAGNOSIS — M546 Pain in thoracic spine: Secondary | ICD-10-CM | POA: Diagnosis not present

## 2017-04-05 DIAGNOSIS — M170 Bilateral primary osteoarthritis of knee: Secondary | ICD-10-CM | POA: Diagnosis not present

## 2017-04-05 MED ORDER — HYDROCOD POLST-CPM POLST ER 10-8 MG/5ML PO SUER: 5.0000 mL | Freq: Two times a day (BID) | ORAL | 0 refills | Status: DC | PRN

## 2017-04-05 NOTE — Telephone Encounter (Signed)
Tussionex has been sent to her pharmacy

## 2017-04-05 NOTE — Telephone Encounter (Signed)
Patient is calling on the status of kumar sending her cough medicine she has a bad cough.  Call it in [place] today she is asking.

## 2017-04-05 NOTE — Telephone Encounter (Signed)
Patient is aware 

## 2017-04-08 ENCOUNTER — Other Ambulatory Visit: Payer: Self-pay | Admitting: Endocrinology

## 2017-04-11 NOTE — Telephone Encounter (Signed)
The night sweats are not likely from the medication.  However she can try to stop it for a week and see what it does

## 2017-04-11 NOTE — Telephone Encounter (Signed)
calcitonin, salmon, (MIACALCIN) 200 UNIT/ACT nasal spray       Pt stated that ever since she started this medication she has had severe night sweats.  She stated she wakes up drenched from sweat, she has to change her sheets three to four time at night and he clothes because she is sweating so bad.  She has been on the medication for 2 months and states she has been having this issue for 2 months.  Wants to know if she can stop medication but wanted to speak with Dr.     Please advise

## 2017-04-12 NOTE — Telephone Encounter (Signed)
There is no other explanation for her night sweats.  She needs to stop the Miacalcin.  If she is not better she will need to come in to the office for evaluation

## 2017-04-12 NOTE — Telephone Encounter (Signed)
Spoke to the patient and she understands the note below and will call if she does not feel better after quitting the medication

## 2017-04-12 NOTE — Telephone Encounter (Signed)
Spoke with the patient and she is afraid to just stop taking the medication but she will if Dr. Dwyane Dee says it is safe to stop for a week to see if this is the cause of the night sweats

## 2017-04-12 NOTE — Telephone Encounter (Signed)
Pt is returning your call

## 2017-04-12 NOTE — Telephone Encounter (Signed)
Left vm requesting patient call back to discuss instructions

## 2017-05-03 DIAGNOSIS — M25512 Pain in left shoulder: Secondary | ICD-10-CM | POA: Diagnosis not present

## 2017-05-03 DIAGNOSIS — M25561 Pain in right knee: Secondary | ICD-10-CM | POA: Diagnosis not present

## 2017-05-03 DIAGNOSIS — M545 Low back pain: Secondary | ICD-10-CM | POA: Diagnosis not present

## 2017-05-03 DIAGNOSIS — M25562 Pain in left knee: Secondary | ICD-10-CM | POA: Diagnosis not present

## 2017-05-10 DIAGNOSIS — L245 Irritant contact dermatitis due to other chemical products: Secondary | ICD-10-CM | POA: Diagnosis not present

## 2017-05-10 DIAGNOSIS — L718 Other rosacea: Secondary | ICD-10-CM | POA: Diagnosis not present

## 2017-05-10 DIAGNOSIS — L820 Inflamed seborrheic keratosis: Secondary | ICD-10-CM | POA: Diagnosis not present

## 2017-05-10 DIAGNOSIS — L821 Other seborrheic keratosis: Secondary | ICD-10-CM | POA: Diagnosis not present

## 2017-05-10 DIAGNOSIS — D485 Neoplasm of uncertain behavior of skin: Secondary | ICD-10-CM | POA: Diagnosis not present

## 2017-05-10 DIAGNOSIS — C44319 Basal cell carcinoma of skin of other parts of face: Secondary | ICD-10-CM | POA: Diagnosis not present

## 2017-05-10 DIAGNOSIS — C4441 Basal cell carcinoma of skin of scalp and neck: Secondary | ICD-10-CM | POA: Diagnosis not present

## 2017-05-12 ENCOUNTER — Other Ambulatory Visit: Payer: Self-pay | Admitting: Endocrinology

## 2017-05-17 DIAGNOSIS — M85851 Other specified disorders of bone density and structure, right thigh: Secondary | ICD-10-CM | POA: Diagnosis not present

## 2017-05-17 DIAGNOSIS — M810 Age-related osteoporosis without current pathological fracture: Secondary | ICD-10-CM | POA: Diagnosis not present

## 2017-05-25 ENCOUNTER — Other Ambulatory Visit: Payer: Self-pay | Admitting: Endocrinology

## 2017-05-25 ENCOUNTER — Telehealth: Payer: Self-pay | Admitting: Endocrinology

## 2017-05-25 ENCOUNTER — Telehealth: Payer: Self-pay

## 2017-05-25 NOTE — Telephone Encounter (Signed)
Last prescription was done in January, does she need a refill?

## 2017-05-25 NOTE — Telephone Encounter (Signed)
ok 

## 2017-05-25 NOTE — Telephone Encounter (Signed)
Patient need a refill of her LORazepam (ATIVAN) 0.5 MG tablet [[id number]]  [facility] [place], [place] [facility]

## 2017-05-25 NOTE — Telephone Encounter (Signed)
Patient requesting refill on lorazepam before she has surgery to get cancer removed - please advise on dosage

## 2017-05-25 NOTE — Telephone Encounter (Signed)
Yes ok to refill 

## 2017-05-26 ENCOUNTER — Other Ambulatory Visit: Payer: Self-pay | Admitting: Endocrinology

## 2017-05-26 ENCOUNTER — Telehealth: Payer: Self-pay | Admitting: Endocrinology

## 2017-05-26 NOTE — Telephone Encounter (Signed)
Patient stated she is calling back to speak with lisa about a medication that was being sent over yesterday that she has not received.    Please advise

## 2017-05-27 DIAGNOSIS — L723 Sebaceous cyst: Secondary | ICD-10-CM | POA: Diagnosis not present

## 2017-05-27 DIAGNOSIS — L820 Inflamed seborrheic keratosis: Secondary | ICD-10-CM | POA: Diagnosis not present

## 2017-05-27 DIAGNOSIS — L821 Other seborrheic keratosis: Secondary | ICD-10-CM | POA: Diagnosis not present

## 2017-05-27 NOTE — Telephone Encounter (Signed)
Spoke with the patient and she just wanted to let me know she received her prescription for anxiety

## 2017-05-30 ENCOUNTER — Other Ambulatory Visit: Payer: Self-pay | Admitting: Endocrinology

## 2017-05-31 DIAGNOSIS — M25562 Pain in left knee: Secondary | ICD-10-CM | POA: Diagnosis not present

## 2017-05-31 DIAGNOSIS — M25561 Pain in right knee: Secondary | ICD-10-CM | POA: Diagnosis not present

## 2017-06-01 DIAGNOSIS — C44319 Basal cell carcinoma of skin of other parts of face: Secondary | ICD-10-CM | POA: Diagnosis not present

## 2017-06-01 DIAGNOSIS — Z85828 Personal history of other malignant neoplasm of skin: Secondary | ICD-10-CM | POA: Diagnosis not present

## 2017-06-05 NOTE — Progress Notes (Signed)
Subjective:     Patient ID: Angela [name], female   DOB: 21-Dec-1932, 84 y.o.   MRN: [id number]  HPI  Chief complaint: follow-up of various problems   Osteoporosis with fracture.  Her history is as follows: Vertebral augmentation at T12. A mild T7 compression deformity is new since June. A mild to moderate T9 compression deformity is not significantly changed. Bone density report not available from orthopedic surgeon on record currently but has been referred for Prolia treatment.  She has not gone for this yet and supposed to see another orthopedic surgeon for this  Hypertension:  She has had long-standing hypertension which has been well controlled.  Has been consistently on Lotensin HCT 20/12.5  She has had occasional edema, has Lasix as needed   BP Readings from Last 3 Encounters:  06/06/17 122/68  03/08/17 122/82  01/09/17 (!) 172/84    PREDIABETES: She has had long-standing mildly impaired fasting glucose  However A1c has been consistently normal and under 6%  In the past had been treated with metformin She says her blood sugars at home usually under 100 fasting    Lab Results  Component Value Date   HGBA1C 5.8 03/08/2017   HGBA1C 5.8 10/05/2016   HGBA1C 5.7 04/13/2016   Lab Results  Component Value Date   MICROALBUR 0.4 12/05/2012   LDLCALC 137 (H) 03/08/2017   CREATININE 0.44 03/08/2017   OTHER active problems are detailed in review of systems:   Allergies as of 06/06/2017      Reactions   Gadolinium Nausea And Vomiting    pt states she had nausea after receiving MAGNEVIST for breast imaging   Pravastatin Other (See Comments)   Locked jaw, weakness   Simvastatin Other (See Comments)   Weakness and locked jaw.   Crestor [rosuvastatin Calcium] Other (See Comments)   Abdominal discomfort   Penicillins Swelling, Rash   Has patient had a PCN reaction causing immediate rash, facial/tongue/throat swelling, SOB or lightheadedness with  hypotension: yes Has patient had a PCN reaction causing severe rash involving mucus membranes or skin necrosis: unknown Has patient had a PCN reaction that required hospitalization: unknown Has patient had a PCN reaction occurring within the last 10 years: no If all of the above answers are "NO", then may proceed with Cephalosporin use.      Medication List        Accurate as of 06/06/17  3:24 PM. Always use your most recent med list.          albuterol 108 (90 Base) MCG/ACT inhaler Commonly known as:  PROVENTIL HFA;VENTOLIN HFA Inhale 2 puffs 4 (four) times daily as needed into the lungs for wheezing or shortness of breath.   benazepril-hydrochlorthiazide 20-12.5 MG tablet Commonly known as:  LOTENSIN HCT Take 1 tablet by mouth daily.   doxycycline 100 MG capsule Commonly known as:  VIBRAMYCIN   ELIQUIS 5 MG Tabs tablet Generic drug:  apixaban TAKE 1 TABLET BY MOUTH TWICE DAILY.   furosemide 20 MG tablet Commonly known as:  LASIX 1 TABLET ONCE DAILY FOR EDEMA (IF WEIGHT HIGH BY 2 POUNDS IN 24 HRS, WEIGH DAILY.)   isosorbide mononitrate 30 MG 24 hr tablet Commonly known as:  IMDUR TAKE 1 TABLET ONCE DAILY.   LORazepam 0.5 MG tablet Commonly known as:  ATIVAN TAKE (1) TABLET TWICE DAILY AS NEEDED FOR ANXIETY.   meloxicam 15 MG tablet Commonly known as:  MOBIC Take 15 mg by mouth daily.   pantoprazole  40 MG tablet Commonly known as:  PROTONIX TAKE 1 TABLET ONCE DAILY.   PERCOCET 10-325 MG tablet Generic drug:  oxyCODONE-acetaminophen Take 0.25-1 tablets by mouth every 6 (six) hours as needed for pain.   polyethylene glycol packet Commonly known as:  MIRALAX / GLYCOLAX Take 17 g by mouth 3 (three) times daily as needed.   SYNTHROID 112 MCG tablet Generic drug:  levothyroxine TAKE 1 TABLET EACH DAY.   Vitamin D (Ergocalciferol) 50000 units Caps capsule Commonly known as:  DRISDOL TAKE ONE CAPSULE ONCE A WEEK.   VOLTAREN 1 % Gel Generic drug:  diclofenac  sodium Apply 2 g topically 4 (four) times daily as needed (pain). To knees       Review of Systems    Primary hypothyroidism: She has had long-standing primary hypothyroidism.   Has been taking 112 g levothyroxine since 03/2016 Recently has been starting to feel better and no unusual fatigue She takes her supplement consistently before breakfast  TSH levels:   Lab Results  Component Value Date   TSH 1.77 03/08/2017   TSH 1.27 10/05/2016   TSH 1.42 07/06/2016   FREET4 1.30 10/05/2016   FREET4 1.21 07/06/2016   FREET4 1.10 04/13/2016    HYPERLIPIDEMIA: not on treatment because of patient's reluctance She refuses to take any medications as she thinks medication may make her have pains, cramps, hair loss or various other possible side effects  Her LDL fluctuates based on her diet however  Lab Results  Component Value Date   CHOL 204 (H) 03/08/2017   CHOL 241 (H) 10/05/2016   CHOL 210 (H) 07/06/2016   Lab Results  Component Value Date   HDL 44.60 03/08/2017   HDL 54.00 10/05/2016   HDL 48.10 07/06/2016   Lab Results  Component Value Date   LDLCALC 137 (H) 03/08/2017   LDLCALC 169 (H) 10/05/2016   LDLCALC 144 (H) 07/06/2016   Lab Results  Component Value Date   TRIG 112.0 03/08/2017   TRIG 90.0 10/05/2016   TRIG 91.0 07/06/2016   Lab Results  Component Value Date   CHOLHDL 5 03/08/2017   CHOLHDL 4 10/05/2016   CHOLHDL 4 07/06/2016   Lab Results  Component Value Date   LDLDIRECT 193.5 02/07/2013   LDLDIRECT 189.5 12/11/2012   LDLDIRECT 201.3 12/05/2012     Night SWEATS: She had called last month about getting night sweats and etiology unclear.  She did not have any infection. She thinks that with stopping Miacalcin her symptoms gradually improved  LEUKOCYTOSIS:  She has persistently high white blood cell count without abnormal morphology, white count may be much higher if she has an infection Periodically followed by hematologist  Lab Results    Component Value Date   WBC 10.9 (H) 03/08/2017   WBC 14.0 (H) 01/09/2017   WBC 16.2 (H) 12/23/2016   WBC 12.6 (H) 10/05/2016         Objective:   Physical Exam  BP 122/68 (BP Location: Right Arm, Patient Position: Sitting, Cuff Size: Large)   Pulse 86   Resp (!) 96        Assessment:      HYPERTENSION: Well controlled  She will continue the same dosage Of Lotensin HCT  HYPOTHYROIDISM:  Has been previously stable on 112 mcg levothyroxine Needs follow-up of TSH on the 112 dose  Probable osteoporosis with rib fractures and history of vertebral fractures  Currently not on treatment and she has been referred to another orthopedic surgeon regular surgeon She  thinks she is not keen on taking any injectable drugs  To have night sweats, is resolved and unclear etiology  History of prediabetes: A1c to be checked today  Severe arthritis of the knees: She says she has difficulty getting up from sitting and she wants a lift chair     Plan:     Discussed the benefits of using Prolia and safety and advised her that this would be the best medication for the circumstances that she can take long-term without possible long-term effects She will consider this and call if she agrees to do that We will also need to recheck her vitamin D level  Follow-up in 4 months       Autym Siess Dwyane Dee 06/06/17

## 2017-06-06 ENCOUNTER — Encounter: Payer: Self-pay | Admitting: Endocrinology

## 2017-06-06 ENCOUNTER — Ambulatory Visit (INDEPENDENT_AMBULATORY_CARE_PROVIDER_SITE_OTHER): Payer: Medicare Other | Admitting: Endocrinology

## 2017-06-06 VITALS — BP 122/68 | HR 86 | Resp 96

## 2017-06-06 DIAGNOSIS — E785 Hyperlipidemia, unspecified: Secondary | ICD-10-CM

## 2017-06-06 DIAGNOSIS — E559 Vitamin D deficiency, unspecified: Secondary | ICD-10-CM

## 2017-06-06 DIAGNOSIS — E063 Autoimmune thyroiditis: Secondary | ICD-10-CM | POA: Diagnosis not present

## 2017-06-06 DIAGNOSIS — I10 Essential (primary) hypertension: Secondary | ICD-10-CM | POA: Diagnosis not present

## 2017-06-06 DIAGNOSIS — R7303 Prediabetes: Secondary | ICD-10-CM

## 2017-06-06 DIAGNOSIS — D72829 Elevated white blood cell count, unspecified: Secondary | ICD-10-CM

## 2017-06-06 DIAGNOSIS — M810 Age-related osteoporosis without current pathological fracture: Secondary | ICD-10-CM

## 2017-06-06 LAB — CBC WITH DIFFERENTIAL/PLATELET
BASOS PCT: 1.4 % (ref 0.0–3.0)
Basophils Absolute: 0.2 10*3/uL — ABNORMAL HIGH (ref 0.0–0.1)
Eosinophils Absolute: 0.2 10*3/uL (ref 0.0–0.7)
Eosinophils Relative: 1 % (ref 0.0–5.0)
HCT: 42.8 % (ref 36.0–46.0)
Hemoglobin: 14.8 g/dL (ref 12.0–15.0)
LYMPHS ABS: 3.4 10*3/uL (ref 0.7–4.0)
Lymphocytes Relative: 19.2 % (ref 12.0–46.0)
MCHC: 34.4 g/dL (ref 30.0–36.0)
MCV: 89.3 fl (ref 78.0–100.0)
MONO ABS: 1.1 10*3/uL — AB (ref 0.1–1.0)
Monocytes Relative: 6.4 % (ref 3.0–12.0)
NEUTROS PCT: 72 % (ref 43.0–77.0)
Neutro Abs: 12.8 10*3/uL — ABNORMAL HIGH (ref 1.4–7.7)
Platelets: 312 10*3/uL (ref 150.0–400.0)
RBC: 4.8 Mil/uL (ref 3.87–5.11)
RDW: 13.6 % (ref 11.5–15.5)
WBC: 17.7 10*3/uL — ABNORMAL HIGH (ref 4.0–10.5)

## 2017-06-06 LAB — COMPREHENSIVE METABOLIC PANEL
ALBUMIN: 3.7 g/dL (ref 3.5–5.2)
ALK PHOS: 104 U/L (ref 39–117)
ALT: 15 U/L (ref 0–35)
AST: 13 U/L (ref 0–37)
BUN: 14 mg/dL (ref 6–23)
CALCIUM: 9 mg/dL (ref 8.4–10.5)
CO2: 31 mEq/L (ref 19–32)
CREATININE: 0.54 mg/dL (ref 0.40–1.20)
Chloride: 96 mEq/L (ref 96–112)
GFR: 114.59 mL/min (ref >60.00)
Glucose, Bld: 112 mg/dL — ABNORMAL HIGH (ref 70–99)
POTASSIUM: 3.5 meq/L (ref 3.5–5.1)
SODIUM: 138 meq/L (ref 135–145)
TOTAL PROTEIN: 6.4 g/dL (ref 6.0–8.3)
Total Bilirubin: 0.7 mg/dL (ref 0.2–1.2)

## 2017-06-06 LAB — HEMOGLOBIN A1C: HEMOGLOBIN A1C: 6 % (ref 4.6–6.5)

## 2017-06-06 LAB — LIPID PANEL
Cholesterol: 225 mg/dL — ABNORMAL HIGH (ref 0–200)
HDL: 49 mg/dL (ref >39.00)
LDL CALC: 156 mg/dL — AB (ref 0–99)
NonHDL: 176.46
TRIGLYCERIDES: 102 mg/dL (ref 0.0–149.0)
Total CHOL/HDL Ratio: 5
VLDL: 20.4 mg/dL (ref 0.0–40.0)

## 2017-06-06 LAB — TSH: TSH: 7.39 u[IU]/mL — AB (ref 0.35–4.50)

## 2017-06-06 LAB — T4, FREE: FREE T4: 1.11 ng/dL (ref 0.60–1.60)

## 2017-06-07 LAB — VITAMIN D 25 HYDROXY (VIT D DEFICIENCY, FRACTURES): VITD: 35.58 ng/mL (ref 30.00–100.00)

## 2017-06-07 NOTE — Progress Notes (Signed)
Please call to let patient know that the thyroid level is low and need to increase her dose up to 125 mcg levothyroxine.  White blood cells are high at 17,000 and she needs to follow-up with hematologist Also schedule her for Prolia if she agrees

## 2017-06-09 ENCOUNTER — Other Ambulatory Visit: Payer: Self-pay | Admitting: Endocrinology

## 2017-06-10 ENCOUNTER — Telehealth: Payer: Self-pay

## 2017-06-10 NOTE — Telephone Encounter (Signed)
The information needed for this has been faxed to [facility]

## 2017-06-10 NOTE — Telephone Encounter (Signed)
It is for a Angela [name]- and I will use N17.0 for osteoarthritis of the knees

## 2017-06-10 NOTE — Telephone Encounter (Signed)
Is it the walker? Put in osteoarthritis knees

## 2017-06-10 NOTE — Telephone Encounter (Signed)
I am preparing a prescription for [place] for [facility] for [facility] and I need a DX code for this - I am sending high priority because I have to leave today at 12 and I want to make sure I get it done today

## 2017-06-14 DIAGNOSIS — H353112 Nonexudative age-related macular degeneration, right eye, intermediate dry stage: Secondary | ICD-10-CM | POA: Diagnosis not present

## 2017-06-14 DIAGNOSIS — H524 Presbyopia: Secondary | ICD-10-CM | POA: Diagnosis not present

## 2017-06-14 DIAGNOSIS — Z961 Presence of intraocular lens: Secondary | ICD-10-CM | POA: Diagnosis not present

## 2017-06-14 DIAGNOSIS — H353121 Nonexudative age-related macular degeneration, left eye, early dry stage: Secondary | ICD-10-CM | POA: Diagnosis not present

## 2017-06-15 DIAGNOSIS — H353132 Nonexudative age-related macular degeneration, bilateral, intermediate dry stage: Secondary | ICD-10-CM | POA: Diagnosis not present

## 2017-06-15 DIAGNOSIS — H43813 Vitreous degeneration, bilateral: Secondary | ICD-10-CM | POA: Diagnosis not present

## 2017-06-16 ENCOUNTER — Telehealth: Payer: Self-pay

## 2017-06-16 NOTE — Telephone Encounter (Signed)
The Prolia has nothing to do with her white blood cell count and there is nothing that would make her allergic.  Would recommend that she start now so that she does not have any further fractures

## 2017-06-16 NOTE — Telephone Encounter (Signed)
Spoke with the patient today about Prolia and she has decided to wait until her wbc count goes down some- I have made her a card and am ready to order the Prolia through her pharmacy so that she will only pay the $3 co-pay for this- she does have concerns about being allergic to Prolia and wants to know if there is anything in Prolia that she could be allergic to please advise

## 2017-06-17 NOTE — Telephone Encounter (Signed)
Patient will call us on the 5th with her decision about Prolia

## 2017-06-19 ENCOUNTER — Other Ambulatory Visit: Payer: Self-pay | Admitting: Endocrinology

## 2017-06-19 DIAGNOSIS — E063 Autoimmune thyroiditis: Secondary | ICD-10-CM

## 2017-06-23 ENCOUNTER — Other Ambulatory Visit: Payer: Self-pay | Admitting: Endocrinology

## 2017-06-23 ENCOUNTER — Inpatient Hospital Stay: Payer: Medicare Other

## 2017-06-23 ENCOUNTER — Inpatient Hospital Stay: Payer: Medicare Other | Attending: Oncology | Admitting: Oncology

## 2017-06-23 ENCOUNTER — Telehealth: Payer: Self-pay | Admitting: Oncology

## 2017-06-23 VITALS — BP 128/70 | HR 98 | Temp 97.8°F | Resp 18 | Ht 68.0 in

## 2017-06-23 DIAGNOSIS — Z853 Personal history of malignant neoplasm of breast: Secondary | ICD-10-CM

## 2017-06-23 DIAGNOSIS — E785 Hyperlipidemia, unspecified: Secondary | ICD-10-CM | POA: Diagnosis not present

## 2017-06-23 DIAGNOSIS — E039 Hypothyroidism, unspecified: Secondary | ICD-10-CM

## 2017-06-23 DIAGNOSIS — D72829 Elevated white blood cell count, unspecified: Secondary | ICD-10-CM

## 2017-06-23 DIAGNOSIS — E063 Autoimmune thyroiditis: Secondary | ICD-10-CM | POA: Diagnosis not present

## 2017-06-23 DIAGNOSIS — R531 Weakness: Secondary | ICD-10-CM | POA: Insufficient documentation

## 2017-06-23 DIAGNOSIS — Z7901 Long term (current) use of anticoagulants: Secondary | ICD-10-CM | POA: Diagnosis not present

## 2017-06-23 DIAGNOSIS — Z86711 Personal history of pulmonary embolism: Secondary | ICD-10-CM

## 2017-06-23 LAB — CBC WITH DIFFERENTIAL/PLATELET
BASOS ABS: 0 10*3/uL (ref 0.0–0.1)
BASOS PCT: 0 %
EOS PCT: 2 %
Eosinophils Absolute: 0.2 10*3/uL (ref 0.0–0.5)
HCT: 42.5 % (ref 34.8–46.6)
Hemoglobin: 14.3 g/dL (ref 11.6–15.9)
LYMPHS PCT: 29 %
Lymphs Abs: 3.2 10*3/uL (ref 0.9–3.3)
MCH: 30.8 pg (ref 25.1–34.0)
MCHC: 33.6 g/dL (ref 31.5–36.0)
MCV: 91.6 fL (ref 79.5–101.0)
MONO ABS: 0.9 10*3/uL (ref 0.1–0.9)
Monocytes Relative: 9 %
Neutro Abs: 6.7 10*3/uL — ABNORMAL HIGH (ref 1.5–6.5)
Neutrophils Relative %: 60 %
PLATELETS: 248 10*3/uL (ref 145–400)
RBC: 4.64 MIL/uL (ref 3.70–5.45)
RDW: 13.1 % (ref 11.2–14.5)
WBC: 11 10*3/uL — ABNORMAL HIGH (ref 3.9–10.3)

## 2017-06-23 NOTE — Progress Notes (Signed)
  [place] OFFICE PROGRESS NOTE   Diagnosis: Neutrophilia, breast cancer  INTERVAL HISTORY:   [name] returns as scheduled.  She had a "skin cancer "removed from the right forehead recently.  No fever or night sweats.  She reports feeling "weak ".  Objective:  Vital signs in last 24 hours:  There were no vitals taken for this visit.    HEENT: Neck without mass Lymphatics: No cervical or supraclavicular nodes Resp: Lungs clear bilaterally Cardio: Regular rate and rhythm GI: No hepatomegaly, the abdomen is protuberant with asymmetric fullness in the left abdomen.  The spleen is not palpable. Vascular: No leg edema, the right lower leg is larger than the left side   Lab Results:  Lab Results  Component Value Date   WBC 11.0 (H) 06/23/2017   HGB 14.3 06/23/2017   HCT 42.5 06/23/2017   MCV 91.6 06/23/2017   PLT 248 06/23/2017   NEUTROABS 6.7 (H) 06/23/2017    Medications: I have reviewed the patient's current medications.   Assessment/Plan: 1. Leukocytosis-mild neutrophilia 2. Knee arthritis 3. BRCA2 carrier 4. Remote history of breast cancer 5. Hypothyroidism 6. Hyperlipidemia 7. Family history of multiple cancers 8. Bilateral pulmonary embolism June 2017-maintained on Apixaban  Disposition: [name] appears stable from a hematologic standpoint.  She has chronic mild neutrophilia.  No clinical evidence for development of a lymphoproliferative disorder.  She would like to continue every 61-monthfollow-up in the hematology clinic.  15 minutes were spent with the patient today.  The majority of the time was used for counseling and coordination of care.  GBetsy Coder MD  06/23/2017  11:39 AM

## 2017-06-23 NOTE — Telephone Encounter (Signed)
Scheduled appt per 4/4 los - sent reminder letter in the mail - f/u in 6 months

## 2017-06-25 LAB — TSH: TSH: 6.94 u[IU]/mL — ABNORMAL HIGH (ref 0.450–4.500)

## 2017-06-25 LAB — SPECIMEN STATUS REPORT

## 2017-06-30 ENCOUNTER — Other Ambulatory Visit: Payer: Self-pay

## 2017-06-30 MED ORDER — SYNTHROID 125 MCG PO TABS
125.0000 ug | ORAL_TABLET | Freq: Every day | ORAL | 4 refills | Status: DC
Start: 2017-06-30 — End: 2017-11-03

## 2017-06-30 NOTE — Progress Notes (Signed)
Please call to let patient know that the thyroid level is still low and she needs to increase her dose from 112 up to 125 mcg daily

## 2017-07-05 DIAGNOSIS — M25561 Pain in right knee: Secondary | ICD-10-CM | POA: Diagnosis not present

## 2017-07-05 DIAGNOSIS — M25521 Pain in right elbow: Secondary | ICD-10-CM | POA: Diagnosis not present

## 2017-07-05 DIAGNOSIS — M25562 Pain in left knee: Secondary | ICD-10-CM | POA: Diagnosis not present

## 2017-07-06 ENCOUNTER — Telehealth: Payer: Self-pay | Admitting: Emergency Medicine

## 2017-07-06 ENCOUNTER — Other Ambulatory Visit: Payer: Self-pay | Admitting: Endocrinology

## 2017-07-06 NOTE — Telephone Encounter (Signed)
Patient called and asked for you to give her a call back about her bone density. Thanks.

## 2017-07-11 NOTE — Telephone Encounter (Signed)
Spoke with the patient and she is going to get prolia through her pharmacy I will send in the prescription in 2 weeks-patient will call when she is ready to schedule a nurse visit to get the injection

## 2017-07-13 ENCOUNTER — Other Ambulatory Visit: Payer: Self-pay

## 2017-07-25 ENCOUNTER — Telehealth: Payer: Self-pay

## 2017-07-28 NOTE — Telephone Encounter (Signed)
Spoke to patient and she has not decided if she will be doing Prolia or not

## 2017-08-02 DIAGNOSIS — M25561 Pain in right knee: Secondary | ICD-10-CM | POA: Diagnosis not present

## 2017-08-02 DIAGNOSIS — L820 Inflamed seborrheic keratosis: Secondary | ICD-10-CM | POA: Diagnosis not present

## 2017-08-02 DIAGNOSIS — Z85828 Personal history of other malignant neoplasm of skin: Secondary | ICD-10-CM | POA: Diagnosis not present

## 2017-08-02 DIAGNOSIS — M25562 Pain in left knee: Secondary | ICD-10-CM | POA: Diagnosis not present

## 2017-08-02 DIAGNOSIS — L821 Other seborrheic keratosis: Secondary | ICD-10-CM | POA: Diagnosis not present

## 2017-08-08 ENCOUNTER — Other Ambulatory Visit: Payer: Self-pay | Admitting: *Deleted

## 2017-08-08 DIAGNOSIS — I712 Thoracic aortic aneurysm, without rupture, unspecified: Secondary | ICD-10-CM

## 2017-08-09 ENCOUNTER — Other Ambulatory Visit: Payer: Self-pay | Admitting: Endocrinology

## 2017-08-09 DIAGNOSIS — H43813 Vitreous degeneration, bilateral: Secondary | ICD-10-CM | POA: Diagnosis not present

## 2017-08-09 DIAGNOSIS — H353132 Nonexudative age-related macular degeneration, bilateral, intermediate dry stage: Secondary | ICD-10-CM | POA: Diagnosis not present

## 2017-08-10 ENCOUNTER — Other Ambulatory Visit: Payer: Self-pay

## 2017-08-10 MED ORDER — BENAZEPRIL-HYDROCHLOROTHIAZIDE 20-12.5 MG PO TABS: 1.0000 | ORAL_TABLET | Freq: Every day | ORAL | 2 refills | Status: DC

## 2017-08-29 NOTE — Telephone Encounter (Signed)
Spoke to the patient today and she is still uncertain if she is going to take prolia she stated she will talk about it in her next appointment on 09/27/17- just an [place]

## 2017-08-30 DIAGNOSIS — M170 Bilateral primary osteoarthritis of knee: Secondary | ICD-10-CM | POA: Diagnosis not present

## 2017-09-04 ENCOUNTER — Other Ambulatory Visit: Payer: Self-pay | Admitting: Endocrinology

## 2017-09-06 ENCOUNTER — Ambulatory Visit
Admission: RE | Admit: 2017-09-06 | Discharge: 2017-09-06 | Disposition: A | Discharge status: Home / Self Care | Payer: Medicare Other | Source: Ambulatory Visit | Attending: Thoracic Surgery (Cardiothoracic Vascular Surgery) | Admitting: Thoracic Surgery (Cardiothoracic Vascular Surgery)

## 2017-09-06 ENCOUNTER — Ambulatory Visit (INDEPENDENT_AMBULATORY_CARE_PROVIDER_SITE_OTHER): Payer: Medicare Other | Admitting: Thoracic Surgery (Cardiothoracic Vascular Surgery)

## 2017-09-06 VITALS — BP 150/80 | HR 78 | Resp 20 | Ht 68.0 in | Wt 202.0 lb

## 2017-09-06 DIAGNOSIS — I712 Thoracic aortic aneurysm, without rupture, unspecified: Secondary | ICD-10-CM

## 2017-09-06 DIAGNOSIS — I7121 Aneurysm of the ascending aorta, without rupture: Secondary | ICD-10-CM

## 2017-09-06 MED ORDER — IOPAMIDOL (ISOVUE-370) INJECTION 76%
75.0000 mL | Freq: Once | INTRAVENOUS | Status: AC | PRN
  Administered 2017-09-06: 75 mL via INTRAVENOUS

## 2017-09-06 NOTE — Progress Notes (Signed)
[address]       [place] [address]             [phone]     HPI: [name] returns for a scheduled follow-up visit  Angela [name] is an 84 year old woman with a past medical history significant for DVT with PE, breast cancer, type 2 diabetes, hypertension, hyperlipidemia, heart murmur, and reflux.  She was hospitalized with pulmonary emboli in June 2017.  A CT angiogram showed a 4 cm ascending aorta.  I saw her in October of that year and recommended one-year follow-up.  I last saw her in the office in June 2018.  Her aneurysm was unchanged at that time.  She had suffered a severe leg fracture which kept her bedridden for several months.  In the interim since her last visit she is made some progress but is still using either a walker or cane.  She has not had any chest pain, pressure, or tightness.  Past Medical History:  Diagnosis Date  . Arthritis   . BRCA2 positive 10/214  . Breast cancer ([facility]) 1994   left sided cancer; unilateral mastectomy, no chemo or radiation  . Diabetes mellitus   . Dyslipidemia   . GERD (gastroesophageal reflux disease)   . Heart murmur   . Hypertension     Current Outpatient Medications  Medication Sig Dispense Refill  . albuterol (PROVENTIL HFA;VENTOLIN HFA) 108 (90 Base) MCG/ACT inhaler Inhale 2 puffs 4 (four) times daily as needed into the lungs for wheezing or shortness of breath. 1 Inhaler 2  . benazepril-hydrochlorthiazide (LOTENSIN HCT) 20-12.5 MG tablet Take 1 tablet by mouth daily. 90 tablet 2  . ELIQUIS 5 MG TABS tablet TAKE 1 TABLET BY MOUTH TWICE DAILY. 60 tablet 0  . furosemide (LASIX) 20 MG tablet 1 TABLET ONCE DAILY FOR EDEMA (IF WEIGHT HIGH BY 2 POUNDS IN 24 HRS, WEIGH DAILY.) 30 tablet 0  . isosorbide mononitrate (IMDUR) 30 MG 24 hr tablet TAKE 1 TABLET ONCE DAILY. 30 tablet 1  . isosorbide mononitrate (IMDUR) 30 MG 24 hr tablet TAKE 1 TABLET ONCE DAILY. 30 tablet 2  . LORazepam (ATIVAN) 0.5 MG  tablet TAKE (1) TABLET TWICE DAILY AS NEEDED FOR ANXIETY. 60 tablet 0  . meloxicam (MOBIC) 15 MG tablet Take 15 mg by mouth daily.    . pantoprazole (PROTONIX) 40 MG tablet TAKE 1 TABLET ONCE DAILY. 30 tablet 0  . PERCOCET 10-325 MG per tablet Take 0.25-1 tablets by mouth every 6 (six) hours as needed for pain.     . polyethylene glycol (MIRALAX / GLYCOLAX) packet Take 17 g by mouth 3 (three) times daily as needed. (Patient taking differently: Take 17 g by mouth 3 (three) times daily as needed for moderate constipation. ) 30 each 0  . SYNTHROID 112 MCG tablet TAKE 1 TABLET EACH DAY. 30 tablet 2  . SYNTHROID 125 MCG tablet Take 1 tablet (125 mcg total) by mouth daily before breakfast. 30 tablet 4  . Vitamin D, Ergocalciferol, (DRISDOL) 50000 units CAPS capsule TAKE ONE CAPSULE ONCE A WEEK. 4 capsule 0  . VOLTAREN 1 % GEL Apply 2 g topically 4 (four) times daily as needed (pain). To knees     No current facility-administered medications for this visit.     Physical Exam BP (!) 150/80   Pulse 78   Resp 20   Ht '5\' 8"'  (1.727 m)   Wt 202 lb (91.6 kg)  SpO2 97% Comment: RA  BMI 30.71 kg/m  Obesity 27-year-old woman in no acute distress Alert and oriented x3 no focal deficits No carotid bruits Cardiac regular rate and slightly irregular rhythm with a high-pitched 2/6 systolic murmur Lungs clear with equal breath sounds bilaterally  Diagnostic Tests: CT ANGIOGRAPHY CHEST WITH CONTRAST  TECHNIQUE: Multidetector CT imaging of the chest was performed using the standard protocol during bolus administration of intravenous contrast. Multiplanar CT image reconstructions and MIPs were obtained to evaluate the vascular anatomy.  CONTRAST:  50m ISOVUE-370 IOPAMIDOL (ISOVUE-370) INJECTION 76%  COMPARISON:  CT scan of January 09, 2017.  FINDINGS: Cardiovascular: 4.1 cm ascending thoracic aortic aneurysm is noted without dissection. Great vessels are widely patent without significant  stenosis. Transverse aortic arch measures 2.6 cm. Proximal descending thoracic aorta measures 2.6 cm. No pericardial effusion is noted.  Mediastinum/Nodes: No enlarged mediastinal, hilar, or axillary lymph nodes. Thyroid gland, trachea, and esophagus demonstrate no significant findings.  Lungs/Pleura: Lungs are clear. No pleural effusion or pneumothorax.  Upper Abdomen: No acute abnormality.  Musculoskeletal: Status post kyphoplasty of T12 vertebral body. Moderate T7 compression deformity is noted most consistent with old fracture. No definite acute abnormality is noted.  Review of the MIP images confirms the above findings.  IMPRESSION: 4.1 cm ascending thoracic aortic aneurysm. Recommend annual imaging followup by CTA or MRA. This recommendation follows 2010 ACCF/AHA/AATS/ACR/ASA/SCA/SCAI/SIR/STS/SVM Guidelines for the Diagnosis and Management of Patients with Thoracic Aortic Disease. Circulation. 2010; 121:: P329-J188  Aortic Atherosclerosis (ICD10-I70.0).   Electronically Signed   By: JMarijo Conception M.D.   On: 09/06/2017 12:48 I personally reviewed the CT images and concur with the findings noted above.  There has been no significant interval change.  Impression: Mrs. Angela GDorothy Sparkis an 84year old woman with a history of hypertension, hyperlipidemia, DVT, PE, thoracic aortic atherosclerosis, type 2 diabetes, and breast cancer.  She was found to years ago to have a 4 cm ascending aneurysm.  She is been followed on an annual basis at that time.  Her CT today shows no significant change in the aneurysm.  She needs continued annual follow-up.  Hypertension-blood pressure elevated at 150/80.  She says she is been very anxious about the results of her scan.  I recommended that she check her pressure at home on a regular basis and she continues to see values over 1416systolic she will need her blood pressure medications adjusted.  Plan: Return in 1 year with CT  angiogram of chest  SMelrose Nakayama MD [facility] ([phone]

## 2017-09-16 ENCOUNTER — Telehealth: Payer: Self-pay | Admitting: Endocrinology

## 2017-09-20 ENCOUNTER — Telehealth: Payer: Self-pay

## 2017-09-20 DIAGNOSIS — L723 Sebaceous cyst: Secondary | ICD-10-CM | POA: Diagnosis not present

## 2017-09-20 DIAGNOSIS — L820 Inflamed seborrheic keratosis: Secondary | ICD-10-CM | POA: Diagnosis not present

## 2017-09-20 DIAGNOSIS — L718 Other rosacea: Secondary | ICD-10-CM | POA: Diagnosis not present

## 2017-09-20 DIAGNOSIS — L821 Other seborrheic keratosis: Secondary | ICD-10-CM | POA: Diagnosis not present

## 2017-09-20 DIAGNOSIS — Z85828 Personal history of other malignant neoplasm of skin: Secondary | ICD-10-CM | POA: Diagnosis not present

## 2017-09-20 NOTE — Telephone Encounter (Signed)
Called pt and notified her that the lab technician will be out of the office on Friday and Monday and she is currently scheduled for labs to be done Friday. Pt was advised that she can go to the lab at [facility]. And have her labs drawn. Pt verbalized understanding.

## 2017-09-23 ENCOUNTER — Other Ambulatory Visit (INDEPENDENT_AMBULATORY_CARE_PROVIDER_SITE_OTHER): Payer: Medicare Other

## 2017-09-23 ENCOUNTER — Other Ambulatory Visit: Payer: Medicare Other

## 2017-09-23 DIAGNOSIS — E063 Autoimmune thyroiditis: Secondary | ICD-10-CM

## 2017-09-23 LAB — TSH: TSH: 2.36 u[IU]/mL (ref 0.35–4.50)

## 2017-09-23 NOTE — Addendum Note (Signed)
Addended by: Isaiah Serge D on: 09/23/2017 02:00 PM   Modules accepted: Orders

## 2017-09-27 ENCOUNTER — Ambulatory Visit: Payer: Medicare Other | Admitting: Endocrinology

## 2017-09-27 DIAGNOSIS — M170 Bilateral primary osteoarthritis of knee: Secondary | ICD-10-CM | POA: Diagnosis not present

## 2017-10-08 ENCOUNTER — Other Ambulatory Visit: Payer: Self-pay | Admitting: Endocrinology

## 2017-10-10 DIAGNOSIS — M5030 Other cervical disc degeneration, unspecified cervical region: Secondary | ICD-10-CM | POA: Diagnosis not present

## 2017-10-11 ENCOUNTER — Ambulatory Visit (INDEPENDENT_AMBULATORY_CARE_PROVIDER_SITE_OTHER): Payer: Medicare Other | Admitting: Endocrinology

## 2017-10-11 ENCOUNTER — Encounter: Payer: Self-pay | Admitting: Endocrinology

## 2017-10-11 VITALS — BP 114/64 | HR 96 | Ht 68.0 in

## 2017-10-11 DIAGNOSIS — M810 Age-related osteoporosis without current pathological fracture: Secondary | ICD-10-CM | POA: Diagnosis not present

## 2017-10-11 DIAGNOSIS — I10 Essential (primary) hypertension: Secondary | ICD-10-CM

## 2017-10-11 DIAGNOSIS — E7800 Pure hypercholesterolemia, unspecified: Secondary | ICD-10-CM

## 2017-10-11 DIAGNOSIS — R7303 Prediabetes: Secondary | ICD-10-CM | POA: Diagnosis not present

## 2017-10-11 DIAGNOSIS — E559 Vitamin D deficiency, unspecified: Secondary | ICD-10-CM

## 2017-10-11 DIAGNOSIS — E063 Autoimmune thyroiditis: Secondary | ICD-10-CM

## 2017-10-11 NOTE — Progress Notes (Signed)
Subjective:     Patient ID: Angela [name], female   DOB: Jul 22, 1932, 84 y.o.   MRN: [id number]  HPI  Chief complaint: follow-up of various problems   OSTEOPOROSIS:   She had developed a vertebral fracture late 2018 X-ray reports as follows: Vertebral augmentation at T12. A mild T7 compression deformity is new since June. A mild to moderate T9 compression deformity is not significantly changed. Bone density report not available from orthopedic surgeon on record currently but has been referred for Prolia treatment.    Previously had a course of calcitonin that was started in 01/2017 with relief of her pain  She has not agreed to do Prolia injection despite repeated explanations and medication being approved by her insurance   Hypertension:  She has had long-standing hypertension which has been well controlled.  Has been consistently on Lotensin HCT 20/12.5 She thinks she checks her blood pressure at home and it is not as low as it was the office on the second measurement  She has had occasional edema, has Lasix as needed   BP Readings from Last 3 Encounters:  10/11/17 114/64  09/06/17 (!) 150/80  06/23/17 128/70    PREDIABETES: She has had long-standing mildly impaired fasting glucose  However A1c has been consistently normal and around 6%  In the past had been treated with metformin  Recent labs not available    Lab Results  Component Value Date   HGBA1C 6.0 06/06/2017   HGBA1C 5.8 03/08/2017   HGBA1C 5.8 10/05/2016   Lab Results  Component Value Date   MICROALBUR 0.4 12/05/2012   LDLCALC 156 (H) 06/06/2017   CREATININE 0.54 06/06/2017   OTHER active problems are detailed in review of systems:   Allergies as of 10/11/2017      Reactions   Gadolinium Nausea And Vomiting    pt states she had nausea after receiving MAGNEVIST for breast imaging   Pravastatin Other (See Comments)   Locked jaw, weakness   Simvastatin Other (See Comments)   Weakness and locked jaw.   Crestor [rosuvastatin Calcium] Other (See Comments)   Abdominal discomfort   Penicillins Swelling, Rash   Has patient had a PCN reaction causing immediate rash, facial/tongue/throat swelling, SOB or lightheadedness with hypotension: yes Has patient had a PCN reaction causing severe rash involving mucus membranes or skin necrosis: unknown Has patient had a PCN reaction that required hospitalization: unknown Has patient had a PCN reaction occurring within the last 10 years: no If all of the above answers are "NO", then may proceed with Cephalosporin use.      Medication List        Accurate as of 10/11/17  4:13 PM. Always use your most recent med list.          albuterol 108 (90 Base) MCG/ACT inhaler Commonly known as:  PROVENTIL HFA;VENTOLIN HFA Inhale 2 puffs 4 (four) times daily as needed into the lungs for wheezing or shortness of breath.   benazepril-hydrochlorthiazide 20-12.5 MG tablet Commonly known as:  LOTENSIN HCT Take 1 tablet by mouth daily.   ELIQUIS 5 MG Tabs tablet Generic drug:  apixaban TAKE 1 TABLET BY MOUTH TWICE DAILY.   furosemide 20 MG tablet Commonly known as:  LASIX 1 TABLET ONCE DAILY FOR EDEMA (IF WEIGHT HIGH BY 2 POUNDS IN 24 HRS, WEIGH DAILY.)   isosorbide mononitrate 30 MG 24 hr tablet Commonly known as:  IMDUR TAKE 1 TABLET ONCE DAILY.   LORazepam 0.5 MG tablet  Commonly known as:  ATIVAN TAKE (1) TABLET TWICE DAILY AS NEEDED FOR ANXIETY.   meloxicam 15 MG tablet Commonly known as:  MOBIC Take 15 mg by mouth daily.   pantoprazole 40 MG tablet Commonly known as:  PROTONIX TAKE 1 TABLET ONCE DAILY.   PERCOCET 10-325 MG tablet Generic drug:  oxyCODONE-acetaminophen Take 0.25-1 tablets by mouth every 6 (six) hours as needed for pain.   polyethylene glycol packet Commonly known as:  MIRALAX / GLYCOLAX Take 17 g by mouth 3 (three) times daily as needed.   SYNTHROID 125 MCG tablet Generic drug:   levothyroxine Take 1 tablet (125 mcg total) by mouth daily before breakfast.   Vitamin D (Ergocalciferol) 50000 units Caps capsule Commonly known as:  DRISDOL TAKE ONE CAPSULE ONCE A WEEK.   VOLTAREN 1 % Gel Generic drug:  diclofenac sodium Apply 2 g topically 4 (four) times daily as needed (pain). To knees       Review of Systems    Primary hypothyroidism: She has had long-standing primary hypothyroidism.   Has been taking 125 mcg levothyroxine since 06/2017 when her TSH was persistently high  Recently has been not feeling unusual fatigue She takes her supplement consistently before breakfast  TSH levels:   Lab Results  Component Value Date   TSH 2.36 09/23/2017   TSH 6.940 (H) 06/23/2017   TSH 7.39 (H) 06/06/2017   FREET4 1.11 06/06/2017   FREET4 1.30 10/05/2016   FREET4 1.21 07/06/2016    HYPERLIPIDEMIA: not on treatment because of patient's reluctance She refuses to take any medications as she thinks medication may make her have pains, cramps, hair loss or various other possible side effects  Her LDL fluctuates based on her diet   Lab Results  Component Value Date   CHOL 225 (H) 06/06/2017   CHOL 204 (H) 03/08/2017   CHOL 241 (H) 10/05/2016   Lab Results  Component Value Date   HDL 49.00 06/06/2017   HDL 44.60 03/08/2017   HDL 54.00 10/05/2016   Lab Results  Component Value Date   LDLCALC 156 (H) 06/06/2017   LDLCALC 137 (H) 03/08/2017   LDLCALC 169 (H) 10/05/2016   Lab Results  Component Value Date   TRIG 102.0 06/06/2017   TRIG 112.0 03/08/2017   TRIG 90.0 10/05/2016   Lab Results  Component Value Date   CHOLHDL 5 06/06/2017   CHOLHDL 5 03/08/2017   CHOLHDL 4 10/05/2016   Lab Results  Component Value Date   LDLDIRECT 193.5 02/07/2013   LDLDIRECT 189.5 12/11/2012   LDLDIRECT 201.3 12/05/2012      LEUKOCYTOSIS:  She has persistently high white blood cell count without abnormal morphology, white count may be much higher if she has an  infection Periodically followed by hematologist  Lab Results  Component Value Date   WBC 11.0 (H) 06/23/2017   WBC 17.7 (H) 06/06/2017   WBC 10.9 (H) 03/08/2017   WBC 14.0 (H) 01/09/2017         Objective:   Physical Exam  BP 114/64 (Cuff Size: Large)   Pulse 96   Ht 5\' 8"  (1.727 m)   SpO2 94%   BMI 30.71 kg/m        Assessment:      HYPERTENSION: Well controlled She actually appears to have a low normal blood pressure today and discussed that considering her age she should take a lower dose of benazepril but she refuses to change her medication stating that her blood pressure is not as  low at home when checked after breakfast   HYPOTHYROIDISM:  Has been previously stable on 112 mcg levothyroxine and now taking 125 mcg with more normal TSH and subjective improvement She will continue this  Probable osteoporosis with rib fractures and history of vertebral fractures  Currently not on treatment and she was convinced that she needs to take Prolia has been approved She is still not committing to this but will let us know when she picks this up from her pharmacy  History of prediabetes: A1c to be checked on the next prescription  Severe arthritis of the knees: Treated by orthopedic surgeon Also recently having cervical pain     Plan:     As above Vitamin D supplementation to be continued  We will also need to recheck her vitamin D level  Advised her to minimize use of meloxicam while taking Eliquis  Follow-up in 4 months       Hetvi Shawhan Dwyane Dee 10/11/17

## 2017-10-25 DIAGNOSIS — L84 Corns and callosities: Secondary | ICD-10-CM | POA: Diagnosis not present

## 2017-11-03 ENCOUNTER — Other Ambulatory Visit: Payer: Self-pay | Admitting: Endocrinology

## 2017-11-22 DIAGNOSIS — M170 Bilateral primary osteoarthritis of knee: Secondary | ICD-10-CM | POA: Diagnosis not present

## 2017-12-08 ENCOUNTER — Other Ambulatory Visit: Payer: Self-pay | Admitting: Endocrinology

## 2017-12-20 DIAGNOSIS — M170 Bilateral primary osteoarthritis of knee: Secondary | ICD-10-CM | POA: Diagnosis not present

## 2017-12-23 ENCOUNTER — Ambulatory Visit: Payer: Medicare Other | Admitting: Oncology

## 2017-12-27 ENCOUNTER — Ambulatory Visit (INDEPENDENT_AMBULATORY_CARE_PROVIDER_SITE_OTHER): Payer: Medicare Other

## 2017-12-27 DIAGNOSIS — L820 Inflamed seborrheic keratosis: Secondary | ICD-10-CM | POA: Diagnosis not present

## 2017-12-27 DIAGNOSIS — L718 Other rosacea: Secondary | ICD-10-CM | POA: Diagnosis not present

## 2017-12-27 DIAGNOSIS — L570 Actinic keratosis: Secondary | ICD-10-CM | POA: Diagnosis not present

## 2017-12-27 DIAGNOSIS — Z23 Encounter for immunization: Secondary | ICD-10-CM

## 2017-12-27 DIAGNOSIS — Z85828 Personal history of other malignant neoplasm of skin: Secondary | ICD-10-CM | POA: Diagnosis not present

## 2017-12-27 DIAGNOSIS — L84 Corns and callosities: Secondary | ICD-10-CM | POA: Diagnosis not present

## 2017-12-27 DIAGNOSIS — L821 Other seborrheic keratosis: Secondary | ICD-10-CM | POA: Diagnosis not present

## 2017-12-27 NOTE — Progress Notes (Signed)
Per orders of Dr. Dwyane Dee injection of Fluzone High dose (0.5 ml) given today by Lattie Haw Kaina Orengo CMA . Patient tolerated injection well.

## 2017-12-30 ENCOUNTER — Inpatient Hospital Stay: Payer: Medicare Other

## 2017-12-30 ENCOUNTER — Telehealth: Payer: Self-pay | Admitting: Emergency Medicine

## 2017-12-30 ENCOUNTER — Telehealth: Payer: Self-pay | Admitting: Oncology

## 2017-12-30 ENCOUNTER — Inpatient Hospital Stay: Payer: Medicare Other | Attending: Oncology | Admitting: Oncology

## 2017-12-30 ENCOUNTER — Encounter: Payer: Self-pay | Admitting: Oncology

## 2017-12-30 VITALS — BP 134/73 | HR 80 | Temp 98.5°F | Resp 18 | Ht 68.0 in

## 2017-12-30 DIAGNOSIS — E785 Hyperlipidemia, unspecified: Secondary | ICD-10-CM

## 2017-12-30 DIAGNOSIS — Z86711 Personal history of pulmonary embolism: Secondary | ICD-10-CM

## 2017-12-30 DIAGNOSIS — E039 Hypothyroidism, unspecified: Secondary | ICD-10-CM | POA: Diagnosis not present

## 2017-12-30 DIAGNOSIS — D72829 Elevated white blood cell count, unspecified: Secondary | ICD-10-CM

## 2017-12-30 DIAGNOSIS — Z1501 Genetic susceptibility to malignant neoplasm of breast: Secondary | ICD-10-CM | POA: Diagnosis not present

## 2017-12-30 DIAGNOSIS — Z853 Personal history of malignant neoplasm of breast: Secondary | ICD-10-CM

## 2017-12-30 LAB — CBC WITH DIFFERENTIAL (CANCER CENTER ONLY)
ABS IMMATURE GRANULOCYTES: 0.34 10*3/uL — AB (ref 0.00–0.07)
BASOS ABS: 0 10*3/uL (ref 0.0–0.1)
BASOS PCT: 0 %
Eosinophils Absolute: 0.1 10*3/uL (ref 0.0–0.5)
Eosinophils Relative: 1 %
HCT: 42.6 % (ref 36.0–46.0)
Hemoglobin: 14.3 g/dL (ref 12.0–15.0)
IMMATURE GRANULOCYTES: 2 %
Lymphocytes Relative: 19 %
Lymphs Abs: 3.1 10*3/uL (ref 0.7–4.0)
MCH: 30.4 pg (ref 26.0–34.0)
MCHC: 33.6 g/dL (ref 30.0–36.0)
MCV: 90.6 fL (ref 80.0–100.0)
MONO ABS: 1.4 10*3/uL — AB (ref 0.1–1.0)
Monocytes Relative: 8 %
NEUTROS ABS: 11.8 10*3/uL — AB (ref 1.7–7.7)
NEUTROS PCT: 70 %
PLATELETS: 282 10*3/uL (ref 150–400)
RBC: 4.7 MIL/uL (ref 3.87–5.11)
RDW: 13.2 % (ref 11.5–15.5)
WBC: 16.7 10*3/uL — AB (ref 4.0–10.5)
nRBC: 0 % (ref 0.0–0.2)

## 2017-12-30 NOTE — Progress Notes (Signed)
  [facility] OFFICE PROGRESS NOTE   Diagnosis: Neutrophilia  INTERVAL HISTORY:   [name] returns as scheduled.  No complaint.  She reports her sister and other family members have been diagnosed with the BRCA2 mutation.  She is scheduled to see Dr. Nori Riis next week.  She does not wish to have her ovaries removed.  Objective:  Vital signs in last 24 hours:  Blood pressure 134/73, pulse 80, temperature 98.5 F (36.9 C), temperature source Oral, resp. rate 18, height '5\' 8"'$  (1.727 m), SpO2 97 %.    HEENT: Neck without mass Lymphatics: No cervical, supraclavicular, or axillary nodes Resp: Lungs clear bilaterally Cardio: Regular rate and rhythm GI: No mass, no hepatosplenomegaly Vascular: No leg edema Breast: Status post left mastectomy.  Bilateral implants in place.  No mass in the right breast.  No evidence for chest wall tumor recurrence.  3-4 mm mobile cutaneous nodule at the left anterolateral chest    Lab Results:  Lab Results  Component Value Date   WBC 11.0 (H) 06/23/2017   HGB 14.3 06/23/2017   HCT 42.5 06/23/2017   MCV 91.6 06/23/2017   PLT 248 06/23/2017   NEUTROABS 6.7 (H) 06/23/2017     Medications: I have reviewed the patient's current medications.   Assessment/Plan:  1. Leukocytosis-mild neutrophilia 2. Knee arthritis 3. BRCA2 carrier 4. Remote history of breast cancer 5. Hypothyroidism 6. Hyperlipidemia 7. Family history of multiple cancers 8. Bilateral pulmonary embolism June 2017-maintained on Apixaban   Disposition: [name] appears well.  She will return to the lab for a CBC today.  She would like to continue every [facility].  She will see Dr. NNori Riisnext week for a GYN exam.  15 minutes were spent with the patient today.  The majority of the time was used for counseling and coordination of care.  GBetsy Coder MD  12/30/2017  11:24 AM

## 2017-12-30 NOTE — Telephone Encounter (Addendum)
Pt verbalized understanding   ----- Message from Ladell Pier, MD sent at 12/30/2017  1:25 PM EDT ----- Please call patient, the CBC is stable, chronic mild neutrophilia, follow-up as scheduled

## 2017-12-30 NOTE — Telephone Encounter (Signed)
Appts scheduled avs/calendar printed per 10/11 los

## 2018-01-02 ENCOUNTER — Other Ambulatory Visit: Payer: Self-pay | Admitting: Endocrinology

## 2018-01-03 DIAGNOSIS — Z01419 Encounter for gynecological examination (general) (routine) without abnormal findings: Secondary | ICD-10-CM | POA: Diagnosis not present

## 2018-01-10 DIAGNOSIS — S70362A Insect bite (nonvenomous), left thigh, initial encounter: Secondary | ICD-10-CM | POA: Diagnosis not present

## 2018-01-10 DIAGNOSIS — Z85828 Personal history of other malignant neoplasm of skin: Secondary | ICD-10-CM | POA: Diagnosis not present

## 2018-01-16 DIAGNOSIS — M25562 Pain in left knee: Secondary | ICD-10-CM | POA: Diagnosis not present

## 2018-01-16 DIAGNOSIS — M25561 Pain in right knee: Secondary | ICD-10-CM | POA: Diagnosis not present

## 2018-01-17 DIAGNOSIS — M170 Bilateral primary osteoarthritis of knee: Secondary | ICD-10-CM | POA: Diagnosis not present

## 2018-01-17 DIAGNOSIS — Z7901 Long term (current) use of anticoagulants: Secondary | ICD-10-CM | POA: Diagnosis not present

## 2018-01-17 DIAGNOSIS — Z9181 History of falling: Secondary | ICD-10-CM | POA: Diagnosis not present

## 2018-01-24 ENCOUNTER — Ambulatory Visit: Payer: Medicare Other

## 2018-01-24 ENCOUNTER — Other Ambulatory Visit: Payer: Self-pay | Admitting: Endocrinology

## 2018-01-26 DIAGNOSIS — M170 Bilateral primary osteoarthritis of knee: Secondary | ICD-10-CM | POA: Diagnosis not present

## 2018-01-26 DIAGNOSIS — Z7901 Long term (current) use of anticoagulants: Secondary | ICD-10-CM | POA: Diagnosis not present

## 2018-01-26 DIAGNOSIS — Z9181 History of falling: Secondary | ICD-10-CM | POA: Diagnosis not present

## 2018-01-27 DIAGNOSIS — M170 Bilateral primary osteoarthritis of knee: Secondary | ICD-10-CM | POA: Diagnosis not present

## 2018-01-27 DIAGNOSIS — Z9181 History of falling: Secondary | ICD-10-CM | POA: Diagnosis not present

## 2018-01-27 DIAGNOSIS — Z7901 Long term (current) use of anticoagulants: Secondary | ICD-10-CM | POA: Diagnosis not present

## 2018-01-30 ENCOUNTER — Other Ambulatory Visit: Payer: Self-pay | Admitting: Endocrinology

## 2018-01-31 ENCOUNTER — Encounter: Payer: Self-pay | Admitting: Endocrinology

## 2018-01-31 ENCOUNTER — Ambulatory Visit (INDEPENDENT_AMBULATORY_CARE_PROVIDER_SITE_OTHER): Payer: Medicare Other | Admitting: Endocrinology

## 2018-01-31 VITALS — BP 110/70 | HR 88

## 2018-01-31 DIAGNOSIS — R7303 Prediabetes: Secondary | ICD-10-CM

## 2018-01-31 DIAGNOSIS — E7800 Pure hypercholesterolemia, unspecified: Secondary | ICD-10-CM

## 2018-01-31 DIAGNOSIS — E559 Vitamin D deficiency, unspecified: Secondary | ICD-10-CM | POA: Diagnosis not present

## 2018-01-31 DIAGNOSIS — I10 Essential (primary) hypertension: Secondary | ICD-10-CM

## 2018-01-31 DIAGNOSIS — Z23 Encounter for immunization: Secondary | ICD-10-CM | POA: Diagnosis not present

## 2018-01-31 DIAGNOSIS — E063 Autoimmune thyroiditis: Secondary | ICD-10-CM

## 2018-01-31 LAB — COMPREHENSIVE METABOLIC PANEL
ALBUMIN: 3.8 g/dL (ref 3.5–5.2)
ALT: 13 U/L (ref 0–35)
AST: 11 U/L (ref 0–37)
Alkaline Phosphatase: 97 U/L (ref 39–117)
BUN: 17 mg/dL (ref 6–23)
CALCIUM: 8.9 mg/dL (ref 8.4–10.5)
CHLORIDE: 101 meq/L (ref 96–112)
CO2: 29 mEq/L (ref 19–32)
Creatinine, Ser: 0.62 mg/dL (ref 0.40–1.20)
GFR: 97.55 mL/min (ref >60.00)
Glucose, Bld: 105 mg/dL — ABNORMAL HIGH (ref 70–99)
POTASSIUM: 3.6 meq/L (ref 3.5–5.1)
Sodium: 139 mEq/L (ref 135–145)
Total Bilirubin: 0.7 mg/dL (ref 0.2–1.2)
Total Protein: 6.5 g/dL (ref 6.0–8.3)

## 2018-01-31 LAB — LIPID PANEL
Cholesterol: 217 mg/dL — ABNORMAL HIGH (ref 0–200)
HDL: 48.7 mg/dL (ref >39.00)
LDL CALC: 144 mg/dL — AB (ref 0–99)
NonHDL: 168.01
Total CHOL/HDL Ratio: 4
Triglycerides: 120 mg/dL (ref 0.0–149.0)
VLDL: 24 mg/dL (ref 0.0–40.0)

## 2018-01-31 LAB — T4, FREE: Free T4: 1.34 ng/dL (ref 0.60–1.60)

## 2018-01-31 LAB — TSH: TSH: 1.02 u[IU]/mL (ref 0.35–4.50)

## 2018-01-31 LAB — HEMOGLOBIN A1C: HEMOGLOBIN A1C: 6 % (ref 4.6–6.5)

## 2018-01-31 LAB — VITAMIN D 25 HYDROXY (VIT D DEFICIENCY, FRACTURES): VITD: 33.93 ng/mL (ref 30.00–100.00)

## 2018-01-31 MED ORDER — PNEUMOCOCCAL 13-VAL CONJ VACC IM SUSP
0.5000 mL | INTRAMUSCULAR | Status: AC
  Administered 2018-01-31: 0.5 mL via INTRAMUSCULAR

## 2018-01-31 NOTE — Progress Notes (Signed)
Subjective:     Patient ID: Angela [name], female   DOB: 23-Jan-1933, 84 y.o.   MRN: [id number]  HPI  Chief complaint: follow-up of various problems   OSTEOPOROSIS:   She had developed a vertebral fracture late 2018; she thinks she also developed rib fractures in the mid 2018 X-ray reports as follows: Vertebral augmentation at T12. A mild T7 compression deformity is new since June. A mild to moderate T9 compression deformity is not significantly changed. Bone density report still not available from orthopedic surgeon on record   Previously had a course of calcitonin that was started in 01/2017 with relief of her pain  She has not agreed to do Prolia injection despite repeated explanations and medication being approved by her insurance  Hypertension:  She has had long-standing hypertension which has been well controlled.  Has been consistently on Lotensin HCT 20/12.5 She claims she checks her blood pressure at home Blood pressure appears to be relatively low in the office again but she refuses to change her medication No lightheadedness  She has had occasional edema, has Lasix as needed   BP Readings from Last 3 Encounters:  01/31/18 110/70  12/30/17 134/73  10/11/17 114/64    PREDIABETES: She has had long-standing mildly impaired fasting glucose  However A1c has been consistently normal and around 6%  In the past had been treated with metformin  No labs available  She checks fasting readings fairly regularly, recent range 89-107 Blood sugar may be relatively higher after she has a steroid injection in her knee    Lab Results  Component Value Date   HGBA1C 6.0 06/06/2017   HGBA1C 5.8 03/08/2017   HGBA1C 5.8 10/05/2016   Lab Results  Component Value Date   MICROALBUR 0.4 12/05/2012   LDLCALC 156 (H) 06/06/2017   CREATININE 0.54 06/06/2017   OTHER active problems are detailed in review of systems:   Allergies as of 01/31/2018      Reactions     Gadolinium Nausea And Vomiting    pt states she had nausea after receiving MAGNEVIST for breast imaging   Pravastatin Other (See Comments)   Locked jaw, weakness   Simvastatin Other (See Comments)   Weakness and locked jaw.   Crestor [rosuvastatin Calcium] Other (See Comments)   Abdominal discomfort   Penicillins Swelling, Rash   Has patient had a PCN reaction causing immediate rash, facial/tongue/throat swelling, SOB or lightheadedness with hypotension: yes Has patient had a PCN reaction causing severe rash involving mucus membranes or skin necrosis: unknown Has patient had a PCN reaction that required hospitalization: unknown Has patient had a PCN reaction occurring within the last 10 years: no If all of the above answers are "NO", then may proceed with Cephalosporin use.      Medication List        Accurate as of 01/31/18  1:19 PM. Always use your most recent med list.          albuterol 108 (90 Base) MCG/ACT inhaler Commonly known as:  PROVENTIL HFA;VENTOLIN HFA Inhale 2 puffs 4 (four) times daily as needed into the lungs for wheezing or shortness of breath.   benazepril-hydrochlorthiazide 20-12.5 MG tablet Commonly known as:  LOTENSIN HCT Take 1 tablet by mouth daily.   ELIQUIS 5 MG Tabs tablet Generic drug:  apixaban TAKE 1 TABLET BY MOUTH TWICE DAILY.   furosemide 20 MG tablet Commonly known as:  LASIX 1 TABLET ONCE DAILY FOR EDEMA (IF WEIGHT HIGH BY  2 POUNDS IN 24 HRS, WEIGH DAILY.)   isosorbide mononitrate 30 MG 24 hr tablet Commonly known as:  IMDUR TAKE 1 TABLET ONCE DAILY.   isosorbide mononitrate 30 MG 24 hr tablet Commonly known as:  IMDUR TAKE 1 TABLET ONCE DAILY.   isosorbide mononitrate 30 MG 24 hr tablet Commonly known as:  IMDUR TAKE 1 TABLET ONCE DAILY.   LORazepam 0.5 MG tablet Commonly known as:  ATIVAN TAKE (1) TABLET TWICE DAILY AS NEEDED FOR ANXIETY.   meloxicam 15 MG tablet Commonly known as:  MOBIC Take 15 mg by mouth daily.    pantoprazole 40 MG tablet Commonly known as:  PROTONIX TAKE 1 TABLET ONCE DAILY.   PERCOCET 10-325 MG tablet Generic drug:  oxyCODONE-acetaminophen Take 0.25-1 tablets by mouth every 6 (six) hours as needed for pain.   polyethylene glycol packet Commonly known as:  MIRALAX / GLYCOLAX Take 17 g by mouth 3 (three) times daily as needed.   SYNTHROID 125 MCG tablet Generic drug:  levothyroxine TAKE 1 TABLET IN THE MORNING WITH BREAKFAST.   Vitamin D (Ergocalciferol) 1.25 MG (50000 UT) Caps capsule Commonly known as:  DRISDOL TAKE ONE CAPSULE ONCE A WEEK.   VOLTAREN 1 % Gel Generic drug:  diclofenac sodium Apply 2 g topically 4 (four) times daily as needed (pain). To knees       Review of Systems    Primary hypothyroidism: She has had long-standing primary hypothyroidism.   Has been taking 125 mcg levothyroxine since 06/2017 when her TSH was persistently high  Now not complaining of any fatigue  She takes her supplement consistently before breakfast  TSH levels:   Lab Results  Component Value Date   TSH 2.36 09/23/2017   TSH 6.940 (H) 06/23/2017   TSH 7.39 (H) 06/06/2017   FREET4 1.11 06/06/2017   FREET4 1.30 10/05/2016   FREET4 1.21 07/06/2016    HYPERLIPIDEMIA: not on treatment because of patient's refusal She thinks medication may make her have pains, cramps, hair loss or various other possible side effects  Her LDL fluctuates and usually over 130  Lab Results  Component Value Date   CHOL 225 (H) 06/06/2017   CHOL 204 (H) 03/08/2017   CHOL 241 (H) 10/05/2016   Lab Results  Component Value Date   HDL 49.00 06/06/2017   HDL 44.60 03/08/2017   HDL 54.00 10/05/2016   Lab Results  Component Value Date   LDLCALC 156 (H) 06/06/2017   LDLCALC 137 (H) 03/08/2017   LDLCALC 169 (H) 10/05/2016   Lab Results  Component Value Date   TRIG 102.0 06/06/2017   TRIG 112.0 03/08/2017   TRIG 90.0 10/05/2016   Lab Results  Component Value Date   CHOLHDL 5  06/06/2017   CHOLHDL 5 03/08/2017   CHOLHDL 4 10/05/2016   Lab Results  Component Value Date   LDLDIRECT 193.5 02/07/2013   LDLDIRECT 189.5 12/11/2012   LDLDIRECT 201.3 12/05/2012      LEUKOCYTOSIS:  She has persistently high white blood cell count without abnormal morphology, white count may be much higher if she has an infection Periodically followed by hematologist  Lab Results  Component Value Date   WBC 16.7 (H) 12/30/2017   WBC 11.0 (H) 06/23/2017   WBC 17.7 (H) 06/06/2017   WBC 10.9 (H) 03/08/2017    ANXIETY, depression: She said that she gets upset easily but may feel better with taking Ativan which she has at home, has been hesitant to take this  Objective:   Physical Exam  BP 110/70   Pulse 88   SpO2 98%        Assessment:      HYPERTENSION: Controlled She is on benazepril HCTZ Blood pressure is low normal but asymptomatic She refuses to change her medication and is anxious about this Discussed that if her renal function shows worsening will need to reduce her dose  Labs to be checked today   HYPOTHYROIDISM:  Has been recently taking 125 mcg with last normal TSH and subjective improvement in fatigue She will have labs done today  Probable osteoporosis with rib fractures and history of vertebral fractures  Currently not on treatment and she was convinced that she needs to take Prolia has been approved She is still not committing to this but will let us know on the next visit However may also consider giving her Actonel  History of prediabetes: A1c to be checked  ANXIETY: She can take Ativan as needed  Severe arthritis of the knees: Treated by orthopedic surgeon      Plan:     As above  Prevnar injection given, she has had a booster of Pneumovax last year       [name] 01/31/18

## 2018-02-01 DIAGNOSIS — Z7901 Long term (current) use of anticoagulants: Secondary | ICD-10-CM | POA: Diagnosis not present

## 2018-02-01 DIAGNOSIS — M170 Bilateral primary osteoarthritis of knee: Secondary | ICD-10-CM | POA: Diagnosis not present

## 2018-02-01 DIAGNOSIS — Z9181 History of falling: Secondary | ICD-10-CM | POA: Diagnosis not present

## 2018-02-01 NOTE — Progress Notes (Signed)
Please call to let patient know that the lab results are normal and no further action needed, cholesterol 217

## 2018-02-03 DIAGNOSIS — M170 Bilateral primary osteoarthritis of knee: Secondary | ICD-10-CM | POA: Diagnosis not present

## 2018-02-03 DIAGNOSIS — Z9181 History of falling: Secondary | ICD-10-CM | POA: Diagnosis not present

## 2018-02-03 DIAGNOSIS — Z7901 Long term (current) use of anticoagulants: Secondary | ICD-10-CM | POA: Diagnosis not present

## 2018-02-05 IMAGING — CT CT KNEE*L* W/O CM
3 series · 14 of 33 positions shown, 17 images · non-contrast
Comparison: Radiographs from 02/02/2016

CLINICAL DATA: Tibial plateau fracture.  Fall.

EXAM:
CT OF THE left KNEE WITHOUT CONTRAST
TECHNIQUE: Multidetector CT imaging of the left knee was performed according to
the standard protocol. Multiplanar CT image reconstructions were
also generated.

[Series 5: axial st · axial · 0.44mm/px · z∈[+949,+1102]mm · 6 of 134 slices shown, 8 images]
[im 21/134  soft-tissue]
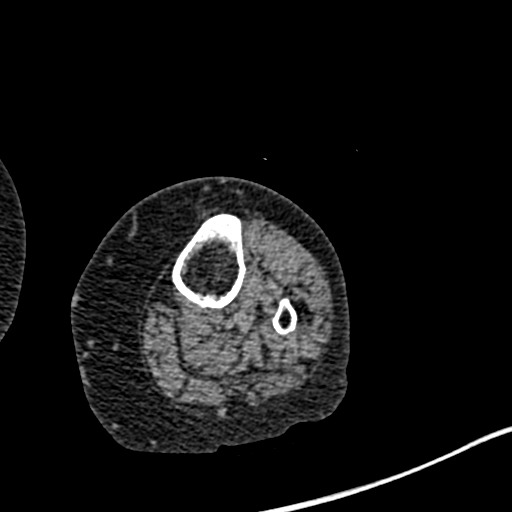
[im 21/134  bone]
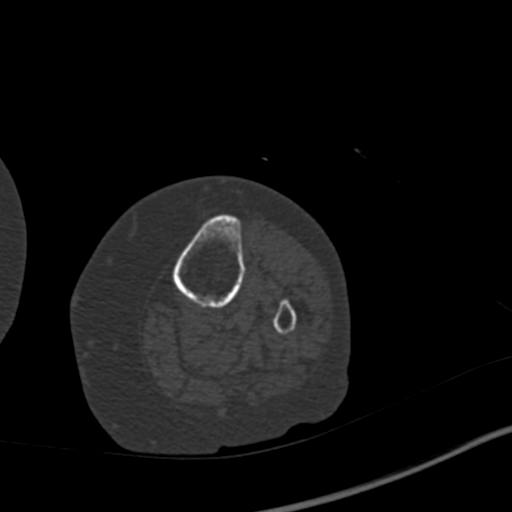
[im 41/134  bone]
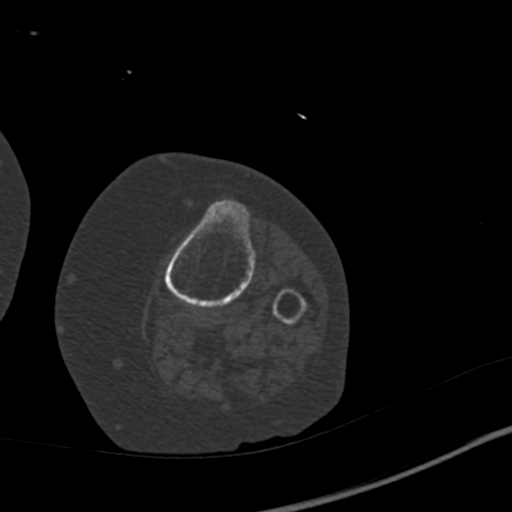
[im 62/134  bone]
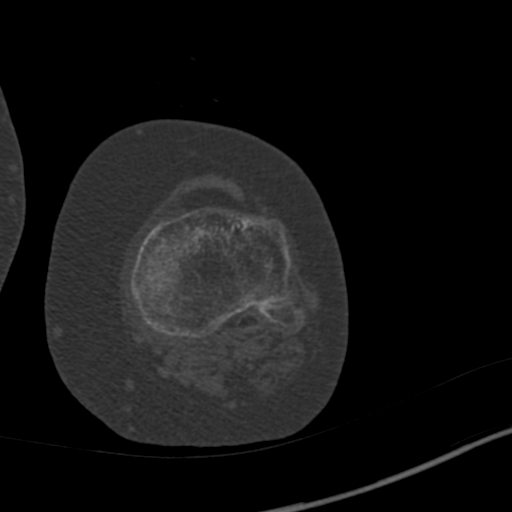
[im 82/134  bone]
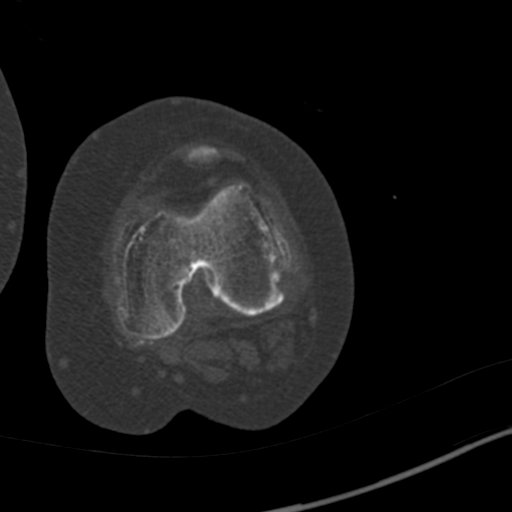
[im 103/134  soft-tissue]
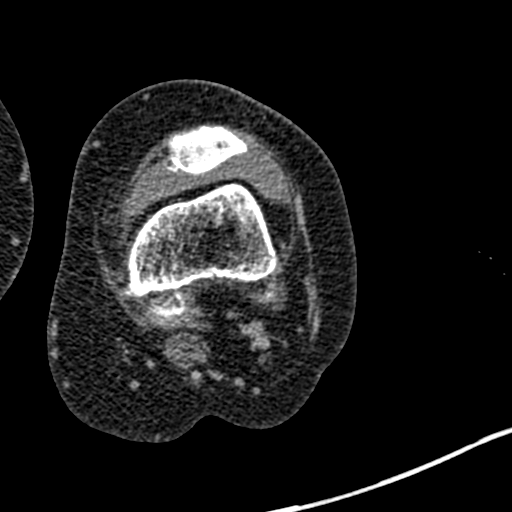
[im 103/134  bone]
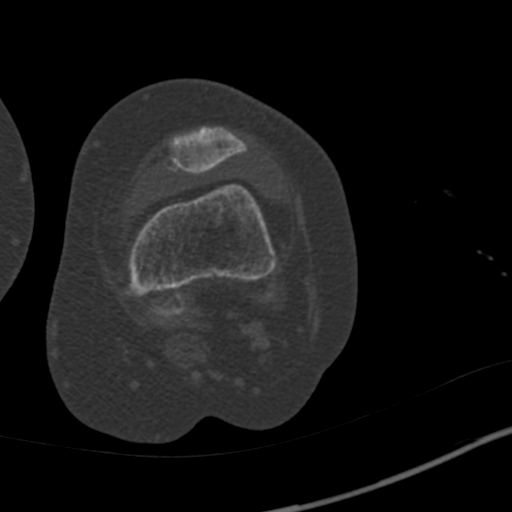
[im 123/134  bone]
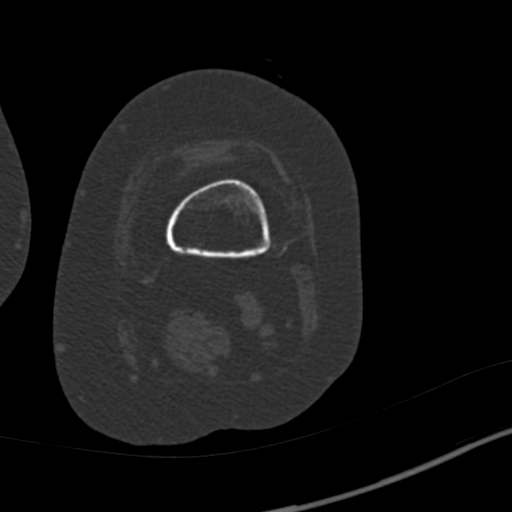

[Series 8: coronal st · coronal · 0.42mm/px · 3 of 114 slices shown]
[im 23/114  bone]
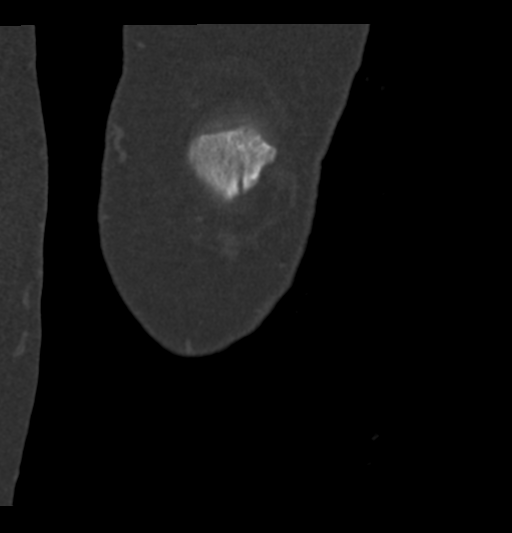
[im 46/114  bone]
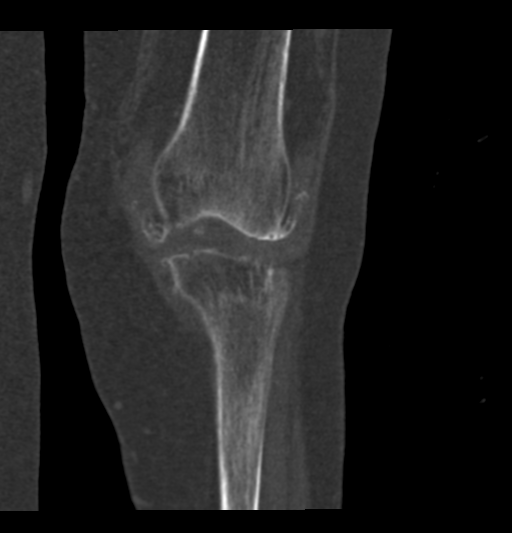
[im 68/114  bone]
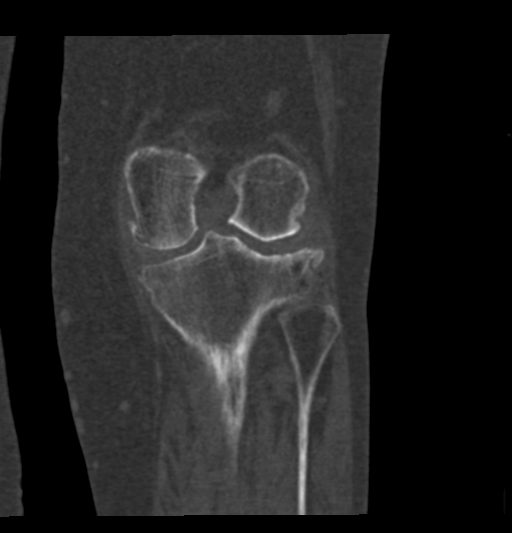

[Series 9: sagittal st · sagittal · 0.40mm/px · 5 of 97 slices shown, 6 images]
[im 33/97  bone]
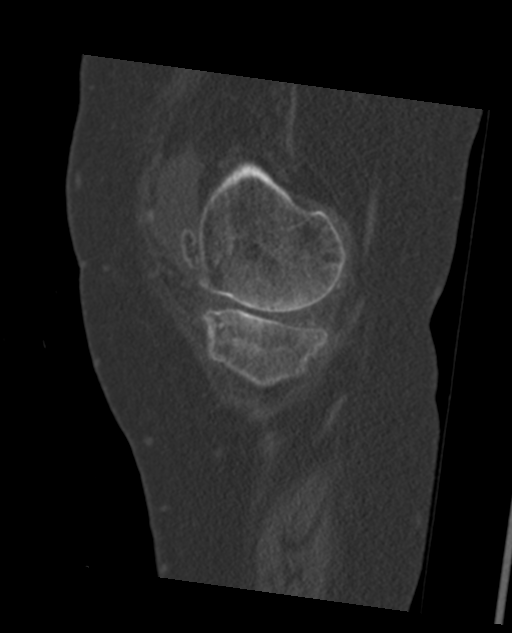
[im 41/97  bone]
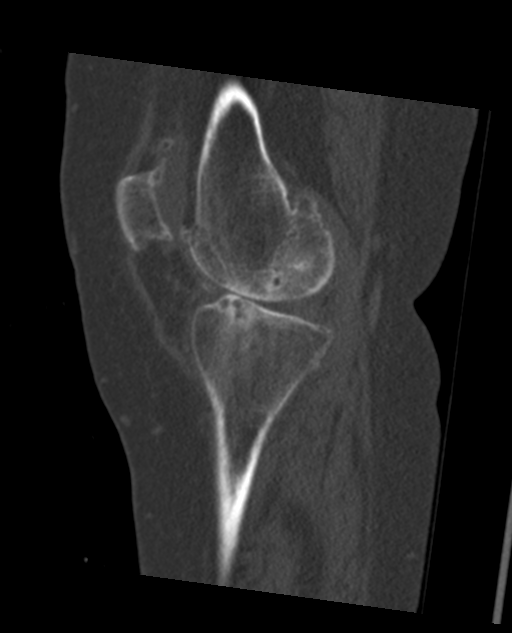
[im 49/97  soft-tissue]
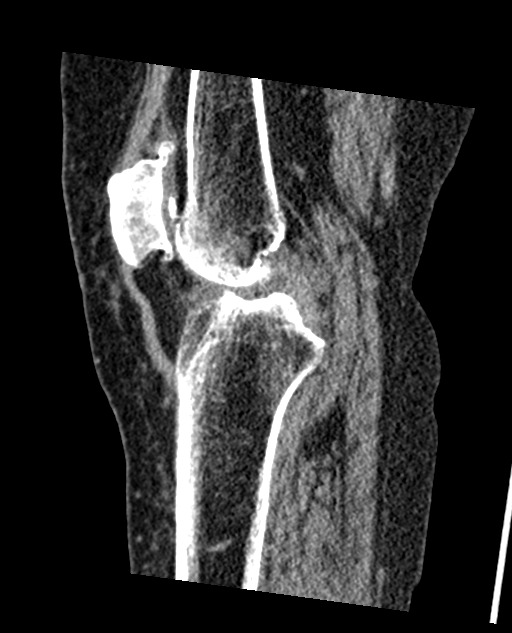
[im 49/97  bone]
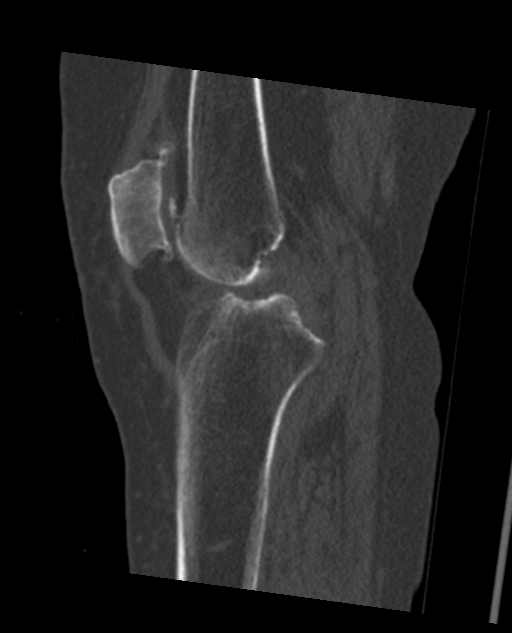
[im 57/97  bone]
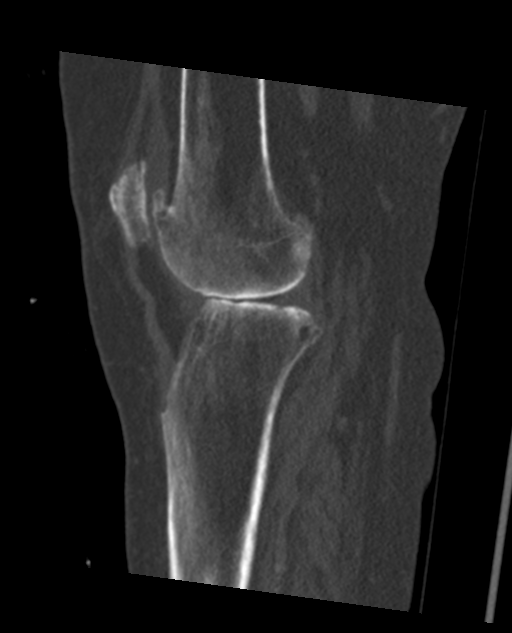
[im 65/97  bone]
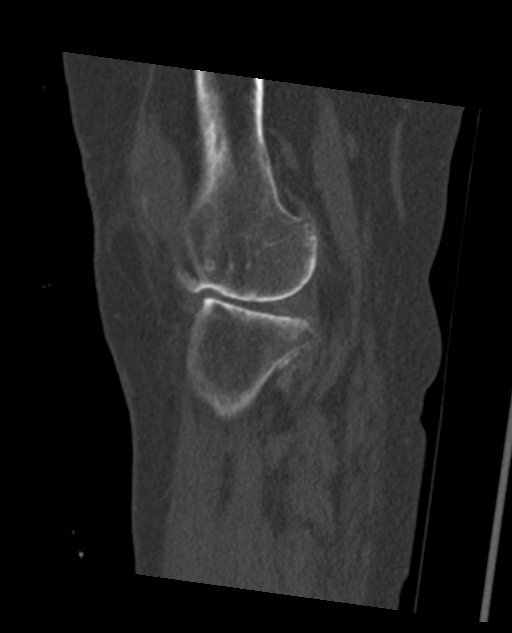

[14 of 33 positions shown; findings below may reference images not displayed]

FINDINGS: Well-defined nondisplaced sagittally oriented fracture of the medial
tibial plateau matching the finding on radiography. This is best
seen at the articular surface and is less sharply defined distally
but believed exit in the vicinity of the fused growth plate rim
medially. This involves about a third of the medial tibial plateau
surface.

There is severe osteoarthritis with prominent articular space
narrowing anteriorly but slightly splayed articular space
posteriorly in both the medial and lateral compartments, and a lax
extensor mechanism. Severe patellofemoral spurring and chondral
thinning. The superior articular for surface in the lateral
compartment is slightly concave up, but no definite lateral
compartmental fracture is seen. This could be from chronic
subsidence.

There is a 10 mm free osteochondral fragment in the popliteus recess
posterolaterally in the knee joint. Degenerative subcortical cystic
lesions are present posteriorly in the lateral tibial plateau and
posteriorly in the lateral femoral condyle.

Hemarthrosis. Regional muscular atrophy. Mild meniscal
chondrocalcinosis.
IMPRESSION: 1. Sagittally oriented nondisplaced fracture of the medial tibial
plateau, exiting medially in the vicinity of the original growth
plate fusion site.
2. Upward scalloping of the lateral tibial plateau could be from
chronic subsidence or less likely from acute impaction. I do not see
definite bony discontinuity in the lateral tibial plateau.
3. Severe osteoarthritis. The knee is somewhat hyperextended during
imaging.
4. Chondrocalcinosis, raising the possibility of CPPD arthropathy.
5. Hemarthrosis.
6. Severe spurring and chondral thinning.
7. 1 cm osteochondral free fragment, chronic, in the popliteus
recess.

## 2018-02-08 DIAGNOSIS — Z7901 Long term (current) use of anticoagulants: Secondary | ICD-10-CM | POA: Diagnosis not present

## 2018-02-08 DIAGNOSIS — Z9181 History of falling: Secondary | ICD-10-CM | POA: Diagnosis not present

## 2018-02-08 DIAGNOSIS — M170 Bilateral primary osteoarthritis of knee: Secondary | ICD-10-CM | POA: Diagnosis not present

## 2018-02-10 DIAGNOSIS — Z9181 History of falling: Secondary | ICD-10-CM | POA: Diagnosis not present

## 2018-02-10 DIAGNOSIS — M170 Bilateral primary osteoarthritis of knee: Secondary | ICD-10-CM | POA: Diagnosis not present

## 2018-02-10 DIAGNOSIS — Z7901 Long term (current) use of anticoagulants: Secondary | ICD-10-CM | POA: Diagnosis not present

## 2018-02-13 DIAGNOSIS — M170 Bilateral primary osteoarthritis of knee: Secondary | ICD-10-CM | POA: Diagnosis not present

## 2018-02-13 DIAGNOSIS — Z9181 History of falling: Secondary | ICD-10-CM | POA: Diagnosis not present

## 2018-02-13 DIAGNOSIS — Z7901 Long term (current) use of anticoagulants: Secondary | ICD-10-CM | POA: Diagnosis not present

## 2018-02-14 DIAGNOSIS — M25562 Pain in left knee: Secondary | ICD-10-CM | POA: Diagnosis not present

## 2018-02-14 DIAGNOSIS — M25561 Pain in right knee: Secondary | ICD-10-CM | POA: Diagnosis not present

## 2018-02-15 DIAGNOSIS — Z7901 Long term (current) use of anticoagulants: Secondary | ICD-10-CM | POA: Diagnosis not present

## 2018-02-15 DIAGNOSIS — Z9181 History of falling: Secondary | ICD-10-CM | POA: Diagnosis not present

## 2018-02-15 DIAGNOSIS — M170 Bilateral primary osteoarthritis of knee: Secondary | ICD-10-CM | POA: Diagnosis not present

## 2018-02-22 DIAGNOSIS — Z9181 History of falling: Secondary | ICD-10-CM | POA: Diagnosis not present

## 2018-02-22 DIAGNOSIS — Z7901 Long term (current) use of anticoagulants: Secondary | ICD-10-CM | POA: Diagnosis not present

## 2018-02-22 DIAGNOSIS — M170 Bilateral primary osteoarthritis of knee: Secondary | ICD-10-CM | POA: Diagnosis not present

## 2018-02-23 ENCOUNTER — Other Ambulatory Visit: Payer: Self-pay | Admitting: Endocrinology

## 2018-02-24 DIAGNOSIS — M170 Bilateral primary osteoarthritis of knee: Secondary | ICD-10-CM | POA: Diagnosis not present

## 2018-02-24 DIAGNOSIS — Z7901 Long term (current) use of anticoagulants: Secondary | ICD-10-CM | POA: Diagnosis not present

## 2018-02-24 DIAGNOSIS — Z9181 History of falling: Secondary | ICD-10-CM | POA: Diagnosis not present

## 2018-03-01 DIAGNOSIS — Z7901 Long term (current) use of anticoagulants: Secondary | ICD-10-CM | POA: Diagnosis not present

## 2018-03-01 DIAGNOSIS — M170 Bilateral primary osteoarthritis of knee: Secondary | ICD-10-CM | POA: Diagnosis not present

## 2018-03-01 DIAGNOSIS — Z9181 History of falling: Secondary | ICD-10-CM | POA: Diagnosis not present

## 2018-03-03 DIAGNOSIS — Z9181 History of falling: Secondary | ICD-10-CM | POA: Diagnosis not present

## 2018-03-03 DIAGNOSIS — M170 Bilateral primary osteoarthritis of knee: Secondary | ICD-10-CM | POA: Diagnosis not present

## 2018-03-03 DIAGNOSIS — Z7901 Long term (current) use of anticoagulants: Secondary | ICD-10-CM | POA: Diagnosis not present

## 2018-03-08 DIAGNOSIS — Z7901 Long term (current) use of anticoagulants: Secondary | ICD-10-CM | POA: Diagnosis not present

## 2018-03-08 DIAGNOSIS — Z9181 History of falling: Secondary | ICD-10-CM | POA: Diagnosis not present

## 2018-03-08 DIAGNOSIS — M170 Bilateral primary osteoarthritis of knee: Secondary | ICD-10-CM | POA: Diagnosis not present

## 2018-03-09 DIAGNOSIS — M25561 Pain in right knee: Secondary | ICD-10-CM | POA: Diagnosis not present

## 2018-03-09 DIAGNOSIS — M25562 Pain in left knee: Secondary | ICD-10-CM | POA: Diagnosis not present

## 2018-03-10 ENCOUNTER — Other Ambulatory Visit: Payer: Self-pay | Admitting: Endocrinology

## 2018-03-10 DIAGNOSIS — M170 Bilateral primary osteoarthritis of knee: Secondary | ICD-10-CM | POA: Diagnosis not present

## 2018-03-10 DIAGNOSIS — Z7901 Long term (current) use of anticoagulants: Secondary | ICD-10-CM | POA: Diagnosis not present

## 2018-03-10 DIAGNOSIS — Z9181 History of falling: Secondary | ICD-10-CM | POA: Diagnosis not present

## 2018-03-13 ENCOUNTER — Other Ambulatory Visit: Payer: Self-pay | Admitting: Endocrinology

## 2018-03-14 DIAGNOSIS — Z7901 Long term (current) use of anticoagulants: Secondary | ICD-10-CM | POA: Diagnosis not present

## 2018-03-14 DIAGNOSIS — M170 Bilateral primary osteoarthritis of knee: Secondary | ICD-10-CM | POA: Diagnosis not present

## 2018-03-14 DIAGNOSIS — Z9181 History of falling: Secondary | ICD-10-CM | POA: Diagnosis not present

## 2018-03-18 DIAGNOSIS — M170 Bilateral primary osteoarthritis of knee: Secondary | ICD-10-CM | POA: Diagnosis not present

## 2018-03-18 DIAGNOSIS — Z9181 History of falling: Secondary | ICD-10-CM | POA: Diagnosis not present

## 2018-03-18 DIAGNOSIS — Z7901 Long term (current) use of anticoagulants: Secondary | ICD-10-CM | POA: Diagnosis not present

## 2018-03-20 DIAGNOSIS — Z9181 History of falling: Secondary | ICD-10-CM | POA: Diagnosis not present

## 2018-03-20 DIAGNOSIS — Z7901 Long term (current) use of anticoagulants: Secondary | ICD-10-CM | POA: Diagnosis not present

## 2018-03-20 DIAGNOSIS — M170 Bilateral primary osteoarthritis of knee: Secondary | ICD-10-CM | POA: Diagnosis not present

## 2018-03-23 ENCOUNTER — Other Ambulatory Visit: Payer: Self-pay | Admitting: Endocrinology

## 2018-03-24 DIAGNOSIS — Z9181 History of falling: Secondary | ICD-10-CM | POA: Diagnosis not present

## 2018-03-24 DIAGNOSIS — M170 Bilateral primary osteoarthritis of knee: Secondary | ICD-10-CM | POA: Diagnosis not present

## 2018-03-24 DIAGNOSIS — Z7901 Long term (current) use of anticoagulants: Secondary | ICD-10-CM | POA: Diagnosis not present

## 2018-03-29 DIAGNOSIS — Z7901 Long term (current) use of anticoagulants: Secondary | ICD-10-CM | POA: Diagnosis not present

## 2018-03-29 DIAGNOSIS — M170 Bilateral primary osteoarthritis of knee: Secondary | ICD-10-CM | POA: Diagnosis not present

## 2018-03-29 DIAGNOSIS — Z9181 History of falling: Secondary | ICD-10-CM | POA: Diagnosis not present

## 2018-03-31 DIAGNOSIS — Z7901 Long term (current) use of anticoagulants: Secondary | ICD-10-CM | POA: Diagnosis not present

## 2018-03-31 DIAGNOSIS — Z9181 History of falling: Secondary | ICD-10-CM | POA: Diagnosis not present

## 2018-03-31 DIAGNOSIS — M170 Bilateral primary osteoarthritis of knee: Secondary | ICD-10-CM | POA: Diagnosis not present

## 2018-04-03 ENCOUNTER — Other Ambulatory Visit: Payer: Self-pay | Admitting: Endocrinology

## 2018-04-04 DIAGNOSIS — M545 Low back pain: Secondary | ICD-10-CM | POA: Diagnosis not present

## 2018-04-04 DIAGNOSIS — R0781 Pleurodynia: Secondary | ICD-10-CM | POA: Diagnosis not present

## 2018-04-05 DIAGNOSIS — Z7901 Long term (current) use of anticoagulants: Secondary | ICD-10-CM | POA: Diagnosis not present

## 2018-04-05 DIAGNOSIS — Z9181 History of falling: Secondary | ICD-10-CM | POA: Diagnosis not present

## 2018-04-05 DIAGNOSIS — M170 Bilateral primary osteoarthritis of knee: Secondary | ICD-10-CM | POA: Diagnosis not present

## 2018-04-07 DIAGNOSIS — Z9181 History of falling: Secondary | ICD-10-CM | POA: Diagnosis not present

## 2018-04-07 DIAGNOSIS — M170 Bilateral primary osteoarthritis of knee: Secondary | ICD-10-CM | POA: Diagnosis not present

## 2018-04-07 DIAGNOSIS — Z7901 Long term (current) use of anticoagulants: Secondary | ICD-10-CM | POA: Diagnosis not present

## 2018-04-12 DIAGNOSIS — Z7901 Long term (current) use of anticoagulants: Secondary | ICD-10-CM | POA: Diagnosis not present

## 2018-04-12 DIAGNOSIS — Z9181 History of falling: Secondary | ICD-10-CM | POA: Diagnosis not present

## 2018-04-12 DIAGNOSIS — M170 Bilateral primary osteoarthritis of knee: Secondary | ICD-10-CM | POA: Diagnosis not present

## 2018-04-14 DIAGNOSIS — M170 Bilateral primary osteoarthritis of knee: Secondary | ICD-10-CM | POA: Diagnosis not present

## 2018-04-14 DIAGNOSIS — Z7901 Long term (current) use of anticoagulants: Secondary | ICD-10-CM | POA: Diagnosis not present

## 2018-04-14 DIAGNOSIS — Z9181 History of falling: Secondary | ICD-10-CM | POA: Diagnosis not present

## 2018-04-25 DIAGNOSIS — Z9181 History of falling: Secondary | ICD-10-CM | POA: Diagnosis not present

## 2018-04-25 DIAGNOSIS — Z7901 Long term (current) use of anticoagulants: Secondary | ICD-10-CM | POA: Diagnosis not present

## 2018-04-25 DIAGNOSIS — M170 Bilateral primary osteoarthritis of knee: Secondary | ICD-10-CM | POA: Diagnosis not present

## 2018-04-27 DIAGNOSIS — Z9181 History of falling: Secondary | ICD-10-CM | POA: Diagnosis not present

## 2018-04-27 DIAGNOSIS — M170 Bilateral primary osteoarthritis of knee: Secondary | ICD-10-CM | POA: Diagnosis not present

## 2018-04-27 DIAGNOSIS — Z7901 Long term (current) use of anticoagulants: Secondary | ICD-10-CM | POA: Diagnosis not present

## 2018-05-02 DIAGNOSIS — M170 Bilateral primary osteoarthritis of knee: Secondary | ICD-10-CM | POA: Diagnosis not present

## 2018-05-03 DIAGNOSIS — M170 Bilateral primary osteoarthritis of knee: Secondary | ICD-10-CM | POA: Diagnosis not present

## 2018-05-03 DIAGNOSIS — Z7901 Long term (current) use of anticoagulants: Secondary | ICD-10-CM | POA: Diagnosis not present

## 2018-05-03 DIAGNOSIS — Z9181 History of falling: Secondary | ICD-10-CM | POA: Diagnosis not present

## 2018-05-05 ENCOUNTER — Other Ambulatory Visit: Payer: Self-pay | Admitting: Endocrinology

## 2018-05-05 DIAGNOSIS — M170 Bilateral primary osteoarthritis of knee: Secondary | ICD-10-CM | POA: Diagnosis not present

## 2018-05-05 DIAGNOSIS — Z9181 History of falling: Secondary | ICD-10-CM | POA: Diagnosis not present

## 2018-05-05 DIAGNOSIS — Z7901 Long term (current) use of anticoagulants: Secondary | ICD-10-CM | POA: Diagnosis not present

## 2018-05-09 DIAGNOSIS — Z9181 History of falling: Secondary | ICD-10-CM | POA: Diagnosis not present

## 2018-05-09 DIAGNOSIS — Z7901 Long term (current) use of anticoagulants: Secondary | ICD-10-CM | POA: Diagnosis not present

## 2018-05-09 DIAGNOSIS — M170 Bilateral primary osteoarthritis of knee: Secondary | ICD-10-CM | POA: Diagnosis not present

## 2018-05-11 DIAGNOSIS — Z9181 History of falling: Secondary | ICD-10-CM | POA: Diagnosis not present

## 2018-05-11 DIAGNOSIS — M170 Bilateral primary osteoarthritis of knee: Secondary | ICD-10-CM | POA: Diagnosis not present

## 2018-05-11 DIAGNOSIS — Z7901 Long term (current) use of anticoagulants: Secondary | ICD-10-CM | POA: Diagnosis not present

## 2018-05-22 ENCOUNTER — Encounter (HOSPITAL_COMMUNITY): Payer: Self-pay | Admitting: Emergency Medicine

## 2018-05-22 ENCOUNTER — Other Ambulatory Visit: Payer: Self-pay

## 2018-05-22 ENCOUNTER — Emergency Department (HOSPITAL_COMMUNITY): Payer: Medicare Other

## 2018-05-22 ENCOUNTER — Inpatient Hospital Stay (HOSPITAL_COMMUNITY)
Admission: EM | Admit: 2018-05-22 | Discharge: 2018-05-25 | DRG: 872 | Disposition: A | Discharge status: Home / Self Care | Payer: Medicare Other | Attending: Internal Medicine | Admitting: Internal Medicine

## 2018-05-22 DIAGNOSIS — R0902 Hypoxemia: Secondary | ICD-10-CM | POA: Diagnosis not present

## 2018-05-22 DIAGNOSIS — Z7901 Long term (current) use of anticoagulants: Secondary | ICD-10-CM

## 2018-05-22 DIAGNOSIS — R6520 Severe sepsis without septic shock: Secondary | ICD-10-CM

## 2018-05-22 DIAGNOSIS — R579 Shock, unspecified: Secondary | ICD-10-CM | POA: Diagnosis not present

## 2018-05-22 DIAGNOSIS — I712 Thoracic aortic aneurysm, without rupture: Secondary | ICD-10-CM | POA: Diagnosis present

## 2018-05-22 DIAGNOSIS — K5900 Constipation, unspecified: Secondary | ICD-10-CM | POA: Diagnosis present

## 2018-05-22 DIAGNOSIS — Z9012 Acquired absence of left breast and nipple: Secondary | ICD-10-CM | POA: Diagnosis not present

## 2018-05-22 DIAGNOSIS — I700 Atherosclerosis of aorta: Secondary | ICD-10-CM | POA: Diagnosis present

## 2018-05-22 DIAGNOSIS — R531 Weakness: Secondary | ICD-10-CM | POA: Diagnosis not present

## 2018-05-22 DIAGNOSIS — I10 Essential (primary) hypertension: Secondary | ICD-10-CM | POA: Diagnosis not present

## 2018-05-22 DIAGNOSIS — R58 Hemorrhage, not elsewhere classified: Secondary | ICD-10-CM | POA: Diagnosis present

## 2018-05-22 DIAGNOSIS — Z79890 Hormone replacement therapy (postmenopausal): Secondary | ICD-10-CM

## 2018-05-22 DIAGNOSIS — J3489 Other specified disorders of nose and nasal sinuses: Secondary | ICD-10-CM | POA: Diagnosis not present

## 2018-05-22 DIAGNOSIS — Z8249 Family history of ischemic heart disease and other diseases of the circulatory system: Secondary | ICD-10-CM | POA: Diagnosis not present

## 2018-05-22 DIAGNOSIS — Z880 Allergy status to penicillin: Secondary | ICD-10-CM

## 2018-05-22 DIAGNOSIS — M1990 Unspecified osteoarthritis, unspecified site: Secondary | ICD-10-CM | POA: Diagnosis present

## 2018-05-22 DIAGNOSIS — R509 Fever, unspecified: Secondary | ICD-10-CM | POA: Diagnosis not present

## 2018-05-22 DIAGNOSIS — Z86711 Personal history of pulmonary embolism: Secondary | ICD-10-CM

## 2018-05-22 DIAGNOSIS — R7303 Prediabetes: Secondary | ICD-10-CM | POA: Diagnosis present

## 2018-05-22 DIAGNOSIS — Z79899 Other long term (current) drug therapy: Secondary | ICD-10-CM

## 2018-05-22 DIAGNOSIS — E669 Obesity, unspecified: Secondary | ICD-10-CM | POA: Diagnosis present

## 2018-05-22 DIAGNOSIS — Z791 Long term (current) use of non-steroidal anti-inflammatories (NSAID): Secondary | ICD-10-CM

## 2018-05-22 DIAGNOSIS — K0510 Chronic gingivitis, plaque induced: Secondary | ICD-10-CM | POA: Diagnosis present

## 2018-05-22 DIAGNOSIS — Z800 Family history of malignant neoplasm of digestive organs: Secondary | ICD-10-CM

## 2018-05-22 DIAGNOSIS — F419 Anxiety disorder, unspecified: Secondary | ICD-10-CM | POA: Diagnosis present

## 2018-05-22 DIAGNOSIS — K760 Fatty (change of) liver, not elsewhere classified: Secondary | ICD-10-CM | POA: Diagnosis present

## 2018-05-22 DIAGNOSIS — Z8052 Family history of malignant neoplasm of bladder: Secondary | ICD-10-CM

## 2018-05-22 DIAGNOSIS — E876 Hypokalemia: Secondary | ICD-10-CM | POA: Diagnosis not present

## 2018-05-22 DIAGNOSIS — R32 Unspecified urinary incontinence: Secondary | ICD-10-CM | POA: Diagnosis present

## 2018-05-22 DIAGNOSIS — I959 Hypotension, unspecified: Secondary | ICD-10-CM | POA: Diagnosis not present

## 2018-05-22 DIAGNOSIS — Z6841 Body Mass Index (BMI) 40.0 and over, adult: Secondary | ICD-10-CM

## 2018-05-22 DIAGNOSIS — R079 Chest pain, unspecified: Secondary | ICD-10-CM | POA: Diagnosis not present

## 2018-05-22 DIAGNOSIS — R112 Nausea with vomiting, unspecified: Secondary | ICD-10-CM | POA: Diagnosis not present

## 2018-05-22 DIAGNOSIS — K0889 Other specified disorders of teeth and supporting structures: Secondary | ICD-10-CM | POA: Diagnosis present

## 2018-05-22 DIAGNOSIS — Z888 Allergy status to other drugs, medicaments and biological substances status: Secondary | ICD-10-CM

## 2018-05-22 DIAGNOSIS — E039 Hypothyroidism, unspecified: Secondary | ICD-10-CM | POA: Diagnosis not present

## 2018-05-22 DIAGNOSIS — K219 Gastro-esophageal reflux disease without esophagitis: Secondary | ICD-10-CM | POA: Diagnosis present

## 2018-05-22 DIAGNOSIS — Z8041 Family history of malignant neoplasm of ovary: Secondary | ICD-10-CM

## 2018-05-22 DIAGNOSIS — G9340 Encephalopathy, unspecified: Secondary | ICD-10-CM

## 2018-05-22 DIAGNOSIS — Z853 Personal history of malignant neoplasm of breast: Secondary | ICD-10-CM

## 2018-05-22 DIAGNOSIS — R000 Tachycardia, unspecified: Secondary | ICD-10-CM | POA: Diagnosis not present

## 2018-05-22 DIAGNOSIS — A419 Sepsis, unspecified organism: Principal | ICD-10-CM | POA: Diagnosis present

## 2018-05-22 DIAGNOSIS — R0689 Other abnormalities of breathing: Secondary | ICD-10-CM | POA: Diagnosis not present

## 2018-05-22 DIAGNOSIS — Z801 Family history of malignant neoplasm of trachea, bronchus and lung: Secondary | ICD-10-CM

## 2018-05-22 DIAGNOSIS — R6521 Severe sepsis with septic shock: Secondary | ICD-10-CM | POA: Diagnosis not present

## 2018-05-22 DIAGNOSIS — Z79891 Long term (current) use of opiate analgesic: Secondary | ICD-10-CM

## 2018-05-22 DIAGNOSIS — Z808 Family history of malignant neoplasm of other organs or systems: Secondary | ICD-10-CM

## 2018-05-22 DIAGNOSIS — Z803 Family history of malignant neoplasm of breast: Secondary | ICD-10-CM

## 2018-05-22 LAB — CBC WITH DIFFERENTIAL/PLATELET
Abs Immature Granulocytes: 0.33 10*3/uL — ABNORMAL HIGH (ref 0.00–0.07)
Basophils Absolute: 0 10*3/uL (ref 0.0–0.1)
Basophils Relative: 0 %
Eosinophils Absolute: 0 10*3/uL (ref 0.0–0.5)
Eosinophils Relative: 0 %
HCT: 31.1 % — ABNORMAL LOW (ref 36.0–46.0)
Hemoglobin: 9.8 g/dL — ABNORMAL LOW (ref 12.0–15.0)
IMMATURE GRANULOCYTES: 2 %
LYMPHS ABS: 1.4 10*3/uL (ref 0.7–4.0)
Lymphocytes Relative: 6 %
MCH: 29.9 pg (ref 26.0–34.0)
MCHC: 31.5 g/dL (ref 30.0–36.0)
MCV: 94.8 fL (ref 80.0–100.0)
Monocytes Absolute: 1.8 10*3/uL — ABNORMAL HIGH (ref 0.1–1.0)
Monocytes Relative: 8 %
NEUTROS PCT: 84 %
Neutro Abs: 18.7 10*3/uL — ABNORMAL HIGH (ref 1.7–7.7)
Platelets: 151 10*3/uL (ref 150–400)
RBC: 3.28 MIL/uL — ABNORMAL LOW (ref 3.87–5.11)
RDW: 13.2 % (ref 11.5–15.5)
WBC: 22.2 10*3/uL — ABNORMAL HIGH (ref 4.0–10.5)
nRBC: 0 % (ref 0.0–0.2)

## 2018-05-22 LAB — URINALYSIS, ROUTINE W REFLEX MICROSCOPIC
Bilirubin Urine: NEGATIVE
GLUCOSE, UA: NEGATIVE mg/dL
Hgb urine dipstick: NEGATIVE
Ketones, ur: NEGATIVE mg/dL
Leukocytes,Ua: NEGATIVE
Nitrite: NEGATIVE
Protein, ur: NEGATIVE mg/dL
Specific Gravity, Urine: 1.028 (ref 1.005–1.030)
pH: 5 (ref 5.0–8.0)

## 2018-05-22 LAB — PROTIME-INR
INR: 1.4 — ABNORMAL HIGH (ref 0.8–1.2)
Prothrombin Time: 17.2 seconds — ABNORMAL HIGH (ref 11.4–15.2)

## 2018-05-22 LAB — CBC
HCT: 37.5 % (ref 36.0–46.0)
Hemoglobin: 12.2 g/dL (ref 12.0–15.0)
MCH: 31.1 pg (ref 26.0–34.0)
MCHC: 32.5 g/dL (ref 30.0–36.0)
MCV: 95.7 fL (ref 80.0–100.0)
Platelets: 183 10*3/uL (ref 150–400)
RBC: 3.92 MIL/uL (ref 3.87–5.11)
RDW: 13.2 % (ref 11.5–15.5)
WBC: 22 10*3/uL — ABNORMAL HIGH (ref 4.0–10.5)
nRBC: 0 % (ref 0.0–0.2)

## 2018-05-22 LAB — COMPREHENSIVE METABOLIC PANEL
ALT: 41 U/L (ref 0–44)
AST: 57 U/L — AB (ref 15–41)
Albumin: 2.3 g/dL — ABNORMAL LOW (ref 3.5–5.0)
Alkaline Phosphatase: 85 U/L (ref 38–126)
Anion gap: 6 (ref 5–15)
BUN: 15 mg/dL (ref 8–23)
CO2: 22 mmol/L (ref 22–32)
Calcium: 5.6 mg/dL — CL (ref 8.9–10.3)
Chloride: 111 mmol/L (ref 98–111)
Creatinine, Ser: 0.57 mg/dL (ref 0.44–1.00)
GFR calc Af Amer: >60 mL/min (ref >60)
GFR calc non Af Amer: >60 mL/min (ref >60)
Glucose, Bld: 130 mg/dL — ABNORMAL HIGH (ref 70–99)
Potassium: <2 mmol/L — CL (ref 3.5–5.1)
Sodium: 139 mmol/L (ref 135–145)
Total Bilirubin: 1.4 mg/dL — ABNORMAL HIGH (ref 0.3–1.2)
Total Protein: 4.2 g/dL — ABNORMAL LOW (ref 6.5–8.1)

## 2018-05-22 LAB — PREPARE RBC (CROSSMATCH)

## 2018-05-22 LAB — POC OCCULT BLOOD, ED: Fecal Occult Bld: POSITIVE — AB

## 2018-05-22 LAB — MRSA PCR SCREENING: MRSA by PCR: NEGATIVE

## 2018-05-22 LAB — I-STAT TROPONIN, ED: Troponin i, poc: 0.03 ng/mL (ref 0.00–0.08)

## 2018-05-22 LAB — MAGNESIUM: Magnesium: 1.1 mg/dL — ABNORMAL LOW (ref 1.7–2.4)

## 2018-05-22 LAB — LACTIC ACID, PLASMA
Lactic Acid, Venous: 1.1 mmol/L (ref 0.5–1.9)
Lactic Acid, Venous: 1.1 mmol/L (ref 0.5–1.9)

## 2018-05-22 LAB — I-STAT CREATININE, ED: CREATININE: 0.4 mg/dL — AB (ref 0.44–1.00)

## 2018-05-22 LAB — LIPASE, BLOOD: Lipase: 26 U/L (ref 11–51)

## 2018-05-22 MED ORDER — SODIUM CHLORIDE 0.9 % IV BOLUS (SEPSIS)
1000.0000 mL | Freq: Once | INTRAVENOUS | Status: AC
  Administered 2018-05-22: 1000 mL via INTRAVENOUS

## 2018-05-22 MED ORDER — POTASSIUM CHLORIDE 10 MEQ/100ML IV SOLN
10.0000 meq | INTRAVENOUS | Status: AC
  Administered 2018-05-22 – 2018-05-23 (×4): 10 meq via INTRAVENOUS
  Filled 2018-05-22 (×4): qty 100

## 2018-05-22 MED ORDER — LORAZEPAM 0.5 MG PO TABS: 0.5000 mg | ORAL_TABLET | Freq: Two times a day (BID) | ORAL | Status: DC | PRN

## 2018-05-22 MED ORDER — OXYCODONE-ACETAMINOPHEN 5-325 MG PO TABS
0.5000 | ORAL_TABLET | Freq: Four times a day (QID) | ORAL | Status: DC | PRN
  Administered 2018-05-22 – 2018-05-25 (×9): 1 via ORAL
  Filled 2018-05-22 (×9): qty 1

## 2018-05-22 MED ORDER — OXYCODONE-ACETAMINOPHEN 10-325 MG PO TABS: 0.2500 | ORAL_TABLET | Freq: Four times a day (QID) | ORAL | Status: DC | PRN

## 2018-05-22 MED ORDER — METRONIDAZOLE IN NACL 5-0.79 MG/ML-% IV SOLN
500.0000 mg | Freq: Three times a day (TID) | INTRAVENOUS | Status: DC
  Administered 2018-05-22 – 2018-05-23 (×3): 500 mg via INTRAVENOUS
  Filled 2018-05-22 (×3): qty 100

## 2018-05-22 MED ORDER — VANCOMYCIN HCL IN DEXTROSE 1-5 GM/200ML-% IV SOLN
1000.0000 mg | INTRAVENOUS | Status: DC
  Administered 2018-05-23 – 2018-05-24 (×2): 1000 mg via INTRAVENOUS
  Filled 2018-05-22 (×2): qty 200

## 2018-05-22 MED ORDER — LEVOTHYROXINE SODIUM 25 MCG PO TABS
125.0000 ug | ORAL_TABLET | Freq: Every day | ORAL | Status: DC
  Administered 2018-05-22 – 2018-05-25 (×3): 125 ug via ORAL
  Filled 2018-05-22 (×3): qty 1

## 2018-05-22 MED ORDER — SODIUM CHLORIDE 0.9 % IV SOLN
2.0000 g | Freq: Once | INTRAVENOUS | Status: AC
  Administered 2018-05-22: 2 g via INTRAVENOUS
  Filled 2018-05-22 (×2): qty 2

## 2018-05-22 MED ORDER — SODIUM CHLORIDE 0.9 % IV SOLN
INTRAVENOUS | Status: DC | PRN
  Administered 2018-05-22: 250 mL via INTRAVENOUS

## 2018-05-22 MED ORDER — SODIUM CHLORIDE 0.9 % IV SOLN: 1.0000 g | Freq: Once | INTRAVENOUS | Status: DC

## 2018-05-22 MED ORDER — DICLOFENAC SODIUM 1 % TD GEL
2.0000 g | Freq: Four times a day (QID) | TRANSDERMAL | Status: DC | PRN
  Filled 2018-05-22 (×2): qty 100

## 2018-05-22 MED ORDER — SODIUM CHLORIDE 0.9 % IV SOLN: 10.0000 mL/h | Freq: Once | INTRAVENOUS | Status: DC

## 2018-05-22 MED ORDER — POTASSIUM CHLORIDE CRYS ER 20 MEQ PO TBCR
40.0000 meq | EXTENDED_RELEASE_TABLET | Freq: Once | ORAL | Status: AC
  Administered 2018-05-22: 40 meq via ORAL
  Filled 2018-05-22: qty 2

## 2018-05-22 MED ORDER — BISACODYL 5 MG PO TBEC
10.0000 mg | DELAYED_RELEASE_TABLET | Freq: Every day | ORAL | Status: DC | PRN
  Administered 2018-05-23 – 2018-05-24 (×2): 10 mg via ORAL
  Filled 2018-05-22 (×2): qty 2

## 2018-05-22 MED ORDER — CALCIUM GLUCONATE-NACL 1-0.675 GM/50ML-% IV SOLN
1.0000 g | Freq: Once | INTRAVENOUS | Status: AC
  Administered 2018-05-22: 1000 mg via INTRAVENOUS
  Filled 2018-05-22: qty 50

## 2018-05-22 MED ORDER — SODIUM CHLORIDE 0.9 % IV SOLN
2.0000 g | Freq: Once | INTRAVENOUS | Status: DC
  Filled 2018-05-22: qty 2

## 2018-05-22 MED ORDER — ORAL CARE MOUTH RINSE
15.0000 mL | Freq: Two times a day (BID) | OROMUCOSAL | Status: DC
  Administered 2018-05-22 – 2018-05-25 (×6): 15 mL via OROMUCOSAL

## 2018-05-22 MED ORDER — ALBUTEROL SULFATE (2.5 MG/3ML) 0.083% IN NEBU
2.5000 mg | INHALATION_SOLUTION | Freq: Four times a day (QID) | RESPIRATORY_TRACT | Status: DC | PRN
  Administered 2018-05-25: 2.5 mg via RESPIRATORY_TRACT
  Filled 2018-05-22: qty 3

## 2018-05-22 MED ORDER — VANCOMYCIN HCL 10 G IV SOLR
1500.0000 mg | INTRAVENOUS | Status: AC
  Administered 2018-05-22: 1500 mg via INTRAVENOUS
  Filled 2018-05-22: qty 1500

## 2018-05-22 MED ORDER — CHLORHEXIDINE GLUCONATE CLOTH 2 % EX PADS
6.0000 | MEDICATED_PAD | Freq: Every day | CUTANEOUS | Status: DC
  Administered 2018-05-23 – 2018-05-25 (×2): 6 via TOPICAL

## 2018-05-22 MED ORDER — FAMOTIDINE 20 MG PO TABS
20.0000 mg | ORAL_TABLET | Freq: Every day | ORAL | Status: DC
  Filled 2018-05-22: qty 1

## 2018-05-22 MED ORDER — APIXABAN 5 MG PO TABS
5.0000 mg | ORAL_TABLET | Freq: Two times a day (BID) | ORAL | Status: DC
  Administered 2018-05-22 – 2018-05-25 (×6): 5 mg via ORAL
  Filled 2018-05-22 (×6): qty 1

## 2018-05-22 MED ORDER — ALBUTEROL SULFATE HFA 108 (90 BASE) MCG/ACT IN AERS: 2.0000 | INHALATION_SPRAY | Freq: Four times a day (QID) | RESPIRATORY_TRACT | Status: DC | PRN

## 2018-05-22 MED ORDER — SODIUM CHLORIDE 0.9% FLUSH
3.0000 mL | Freq: Once | INTRAVENOUS | Status: AC
  Administered 2018-05-22: 3 mL via INTRAVENOUS

## 2018-05-22 MED ORDER — IOHEXOL 350 MG/ML SOLN
100.0000 mL | Freq: Once | INTRAVENOUS | Status: AC | PRN
  Administered 2018-05-22: 100 mL via INTRAVENOUS

## 2018-05-22 MED ORDER — ISOSORBIDE MONONITRATE ER 30 MG PO TB24
30.0000 mg | ORAL_TABLET | Freq: Every day | ORAL | Status: DC
  Administered 2018-05-23 – 2018-05-25 (×3): 30 mg via ORAL
  Filled 2018-05-22 (×3): qty 1

## 2018-05-22 MED ORDER — OXYCODONE HCL 5 MG PO TABS
0.0000 mg | ORAL_TABLET | Freq: Four times a day (QID) | ORAL | Status: DC | PRN
  Administered 2018-05-24: 5 mg via ORAL
  Filled 2018-05-22 (×2): qty 1

## 2018-05-22 MED ORDER — SODIUM CHLORIDE 0.9 % IV SOLN
2.0000 g | Freq: Three times a day (TID) | INTRAVENOUS | Status: DC
  Administered 2018-05-23: 2 g via INTRAVENOUS
  Filled 2018-05-22 (×2): qty 2

## 2018-05-22 MED ORDER — MAGNESIUM SULFATE 2 GM/50ML IV SOLN
2.0000 g | Freq: Once | INTRAVENOUS | Status: AC
  Administered 2018-05-22: 2 g via INTRAVENOUS
  Filled 2018-05-22: qty 50

## 2018-05-22 MED ORDER — SODIUM CHLORIDE (PF) 0.9 % IJ SOLN
INTRAMUSCULAR | Status: AC
  Administered 2018-05-22: 17:00:00
  Filled 2018-05-22: qty 50

## 2018-05-22 MED ORDER — VANCOMYCIN HCL IN DEXTROSE 1-5 GM/200ML-% IV SOLN
1000.0000 mg | Freq: Once | INTRAVENOUS | Status: DC
  Filled 2018-05-22: qty 200

## 2018-05-22 MED ORDER — POTASSIUM CHLORIDE 2 MEQ/ML IV SOLN
INTRAVENOUS | Status: DC
  Administered 2018-05-22 – 2018-05-23 (×2): via INTRAVENOUS
  Filled 2018-05-22 (×5): qty 1000

## 2018-05-22 MED ORDER — PANTOPRAZOLE SODIUM 40 MG PO TBEC
40.0000 mg | DELAYED_RELEASE_TABLET | Freq: Every day | ORAL | Status: DC
  Administered 2018-05-23 – 2018-05-25 (×3): 40 mg via ORAL
  Filled 2018-05-22 (×3): qty 1

## 2018-05-22 NOTE — ED Triage Notes (Signed)
Per EMS- patient lives at home alone with a home CNA. Patient c/o emesis since waking this AM. Home CNA told EMS that the patient had a temperature of 101.8 orally  Patient was given NS 375 ml prior to arrival to the ED and Zofran 4 mg IV

## 2018-05-22 NOTE — Progress Notes (Addendum)
eLink Physician-Brief Progress Note Patient Name: Angela [name] DOB: 02/21/1933 MRN: [id number]   Date of Service  05/22/2018  HPI/Events of Note  Pt complained of constipation.  Takes dulcolax at home.  eICU Interventions  Dulcolax ordered.     Intervention Category Minor Interventions: Other:  Elsie Lincoln 05/22/2018, 10:04 PM   11:21 PM  Pt requested to switch Pepcid to PPI which is her home medication.    Plan> D/C pepcid.  Give Protonix 40mg  once daily.  12:42 AM Pt requesting orajel for tooth pain.  Plan>  Orajel ordered.

## 2018-05-22 NOTE — Progress Notes (Signed)
Pharmacy Antibiotic Note  Angela [name] Ward [name] is a 85 y.o. female admitted on 05/22/2018 with sepsis.  Pharmacy has been consulted for vancomycin and aztreonam dosing. Pt has PCN allergy (swelling, rash).  Pt hypotensive on admission. Broad spectrum antibiotics started for sepsis.   Assessment:  WBC 22 - elevated  Afebrile  SCr - WNL.   Plan:  Aztreonam 2 g IV q8h  Vancomycin 1500 mg loading dose followed by 1000 mg IV q24h for goal AUC 400-500  Follow renal function  Follow culture data and clinical course  Check vancomycin levels at steady state if indicated  Height: 5\' 8"  (172.7 cm) Weight: 220 lb (99.8 kg) IBW/kg (Calculated) : 63.9  Temp (24hrs), Avg:98.5 F (36.9 C), Min:98.5 F (36.9 C), Max:98.5 F (36.9 C)  Recent Labs  Lab 05/22/18 1239 05/22/18 1240 05/22/18 1257 05/22/18 1338  WBC  --  22.2*  --   --   CREATININE  --  0.57 0.40*  --   LATICACIDVEN 1.1  --   --  1.1    Estimated Creatinine Clearance: 65.9 mL/min (A) (by C-G formula based on SCr of 0.4 mg/dL (L)).    Allergies  Allergen Reactions  . Crestor [Rosuvastatin Calcium] Anaphylaxis and Other (See Comments)    Abdominal discomfort  . Gadolinium Nausea And Vomiting     pt states she had nausea after receiving MAGNEVIST for breast imaging   . Pravastatin Other (See Comments)    Locked jaw, weakness  . Simvastatin Other (See Comments)    Weakness and locked jaw.  . Penicillins Swelling and Rash    Has patient had a PCN reaction causing immediate rash, facial/tongue/throat swelling, SOB or lightheadedness with hypotension: yes Has patient had a PCN reaction causing severe rash involving mucus membranes or skin necrosis: unknown Has patient had a PCN reaction that required hospitalization: unknown Has patient had a PCN reaction occurring within the last 10 years: no If all of the above answers are "NO", then may proceed with Cephalosporin use.     Antimicrobials this admission: vancomycin  3/2 >>  aztreonam 3/2 >>  Metronidazole in ED 3/2  Dose adjustments this admission:  Microbiology results: 3/2 BCx: Sent 3/2 UCx: Sent   Thank you for allowing pharmacy to be a part of this patient's care.  Lenis Noon, PharmD 05/22/2018 4:46 PM

## 2018-05-22 NOTE — ED Notes (Addendum)
Dr. Cicero Duck [phone]) regarding Magnesium 1.1. Awaiting return call

## 2018-05-22 NOTE — ED Notes (Signed)
Freda Munro RN straight stick for 2nd blood culture and labs to R fa.

## 2018-05-22 NOTE — ED Notes (Signed)
Bed: XG52 Expected date:  Expected time:  Means of arrival:  Comments: EMS on oxygen

## 2018-05-22 NOTE — Progress Notes (Signed)
A consult was received from an ED physician for vancomycin and aztreonam per pharmacy dosing.  The patient's profile has been reviewed for ht/wt/allergies/indication/available labs.    A one time order has been placed for aztreonam 2 g IV once and vancomycin 1500 mg IV once. Pt has listed PCN allergy documented in chart with reaction of "swelling, rash".  Further antibiotics/pharmacy consults should be ordered by admitting physician if indicated.                       Thank you, Lenis Noon, PharmD 05/22/2018  1:00 PM

## 2018-05-22 NOTE — ED Notes (Signed)
CRITICAL VALUE ALERT  Critical Value:  Potassium less than 2.0 & Calcium 5.6  Date & Time Notied:  05/22/2018 @ 3536  Provider Notified: Regenia Skeeter, MD  Orders Received/Actions taken: Verbally communicated to MD face to face.

## 2018-05-22 NOTE — ED Notes (Signed)
First set of blood cultures drawn from IV start R hand.

## 2018-05-22 NOTE — ED Provider Notes (Signed)
[facility] DEPT Provider Note   CSN: [id number] Arrival date & time: 05/22/18  1158    History   Chief Complaint Chief Complaint  Patient presents with  . Fever  . Emesis    HPI Angela [name] is a 85 y.o. female.     HPI  85 year old female presents from home with fever, vomiting, and weakness.  She states that she all of a sudden started feeling bad last night in the middle the night.  She had some urinary incontinence which is new for her.  She had a fever this morning of 101.  She was too weak to get up out of her bed to go to the door to answer the door for her friend.  She was given Zofran by EMS.  She denies specific abdominal pain.  She denies chest pain, shortness of breath or coughing.  Past Medical History:  Diagnosis Date  . Arthritis   . BRCA2 positive 10/214  . Breast cancer ([place]) 1994   left sided cancer; unilateral mastectomy, no chemo or radiation  . Diabetes mellitus   . Dyslipidemia   . GERD (gastroesophageal reflux disease)   . Heart murmur   . Hypertension     Patient Active Problem List   Diagnosis Date Noted  . Ascending aortic aneurysm ([place]) 01/13/2016  . Thoracic aortic atherosclerosis ([place]) 01/13/2016  . Pulmonary embolism without acute cor pulmonale ([place])   . Pulmonary emboli ([place]) 08/30/2015  . Acute massive pulmonary embolism ([place]) 08/30/2015  . Leukocytosis 12/23/2014  . BRCA2 positive   . Hypothyroidism (acquired) 12/04/2012  . Heart murmur 09/01/2012  . Chest pain 09/17/2010  . Hypertension   . Dyslipidemia   . Prediabetes   . Arthritis   . GERD (gastroesophageal reflux disease)     Past Surgical History:  Procedure Laterality Date  . CARDIAC CATHETERIZATION     NORMAL CORONARY ARTERIES  . CHOLECYSTECTOMY    . MASTECTOMY     LEFT BREAST  . TONSILLECTOMY       OB History   No obstetric history on file.      Home Medications    Prior to Admission medications   Medication Sig  Start Date End Date Taking? Authorizing Provider  amoxicillin (AMOXIL) 500 MG capsule Take 500 mg by mouth daily. 04/18/18   [provider]  benazepril-hydrochlorthiazide (LOTENSIN HCT) 20-12.5 MG tablet TAKE 1 TABLET ONCE DAILY. 05/05/18   [name], MD  ELIQUIS 5 MG TABS tablet TAKE 1 TABLET BY MOUTH TWICE DAILY. 04/03/18   [name], MD  furosemide (LASIX) 20 MG tablet 1 TABLET ONCE DAILY FOR EDEMA (IF WEIGHT HIGH BY 2 POUNDS IN 24 HRS, WEIGH DAILY.) 03/13/18   [name], MD  isosorbide mononitrate (IMDUR) 30 MG 24 hr tablet TAKE 1 TABLET ONCE DAILY. 12/09/17   [name], MD  isosorbide mononitrate (IMDUR) 30 MG 24 hr tablet TAKE 1 TABLET ONCE DAILY. 01/02/18   [name], MD  isosorbide mononitrate (IMDUR) 30 MG 24 hr tablet TAKE 1 TABLET ONCE DAILY. 01/31/18   [name], MD  isosorbide mononitrate (IMDUR) 30 MG 24 hr tablet TAKE 1 TABLET ONCE DAILY. 03/10/18   [name], MD  isosorbide mononitrate (IMDUR) 30 MG 24 hr tablet TAKE 1 TABLET ONCE DAILY. 04/03/18   [name], MD  LORazepam (ATIVAN) 0.5 MG tablet TAKE (1) TABLET TWICE DAILY AS NEEDED FOR ANXIETY. 05/26/17   [name], MD  meloxicam (MOBIC) 15 MG tablet Take 15  mg by mouth daily. 01/05/17   [provider]  pantoprazole (PROTONIX) 40 MG tablet TAKE 1 TABLET ONCE DAILY. 04/03/18   [name], MD  PERCOCET 10-325 MG per tablet Take 0.25-1 tablets by mouth every 6 (six) hours as needed for pain.  02/19/14   [provider]  polyethylene glycol (MIRALAX / GLYCOLAX) packet Take 17 g by mouth 3 (three) times daily as needed. Patient taking differently: Take 17 g by mouth 3 (three) times daily as needed for moderate constipation.  05/26/12   Drenda Freeze, MD  SYNTHROID 125 MCG tablet TAKE 1 TABLET IN THE MORNING WITH BREAKFAST. 04/03/18   [name], MD  VENTOLIN HFA 108 (90 Base) MCG/ACT inhaler USE 2 PUFFS 4 TIMES A DAY AS NEEDED FOR WHEEZE OR SHORTNESS OF BREATH. 03/23/18   Lucilla Lame, MD  Vitamin  D, Ergocalciferol, (DRISDOL) 1.25 MG (50000 UT) CAPS capsule TAKE ONE CAPSULE ONCE A WEEK. 04/03/18   [name], MD  VOLTAREN 1 % GEL Apply 2 g topically 4 (four) times daily as needed (pain). To knees 02/06/14   [provider]    Family History Family History  Problem Relation Age of Onset  . Heart attack Maternal Grandmother   . Pancreatic cancer Mother 59  . Bladder Cancer Father 31       heavy smoker and drinker  . Ovarian cancer Sister 64  . Hypertension Sister   . Lung cancer Brother 37  . Ovarian cancer Maternal Aunt 62  . Stomach cancer Maternal Grandfather   . Bone cancer Paternal Grandmother        unsure if this was a primary cancer  . Stomach cancer Paternal Grandfather   . Lung cancer Maternal Aunt        died in her 3s; former smoker  . Breast cancer Cousin        dx in her late 45s to 72s; paternal cousin  . Heart disease Neg Hx     Social History Social History   Tobacco Use  . Smoking status: Never Smoker  . Smokeless tobacco: Never Used  Substance Use Topics  . Alcohol use: No  . Drug use: No     Allergies   Gadolinium; Pravastatin; Simvastatin; Crestor [rosuvastatin calcium]; and Penicillins   Review of Systems Review of Systems  Constitutional: Positive for fever.  Respiratory: Negative for cough and shortness of breath.   Cardiovascular: Negative for chest pain.  Gastrointestinal: Positive for vomiting. Negative for abdominal pain.  Genitourinary:       Urinary incontinence  Neurological: Positive for weakness.  All other systems reviewed and are negative.    Physical Exam Updated Vital Signs BP (!) 56/44 (BP Location: Right Arm)   Pulse 96   Temp 98.5 F (36.9 C) (Oral)   SpO2 100%   Physical Exam Vitals signs and nursing note reviewed.  Constitutional:      Appearance: She is well-developed. She is obese.  HENT:     Head: Normocephalic and atraumatic.     Right Ear: External ear normal.     Left Ear: External ear  normal.     Nose: Nose normal.  Eyes:     General:        Right eye: No discharge.        Left eye: No discharge.  Cardiovascular:     Rate and Rhythm: Normal rate and regular rhythm.     Heart sounds: Normal heart sounds.  Pulmonary:  Effort: Pulmonary effort is normal.     Breath sounds: Normal breath sounds. No wheezing.  Abdominal:     Palpations: Abdomen is soft.     Tenderness: There is abdominal tenderness (diffuse tenderness).  Skin:    General: Skin is warm and dry.  Neurological:     Mental Status: She is alert.  Psychiatric:        Mood and Affect: Mood is not anxious.      ED Treatments / Results  Labs (all labs ordered are listed, but only abnormal results are displayed) Labs Reviewed  COMPREHENSIVE METABOLIC PANEL - Abnormal; Notable for the following components:      Result Value   Potassium <2.0 (*)    Glucose, Bld 130 (*)    Calcium 5.6 (*)    Total Protein 4.2 (*)    Albumin 2.3 (*)    AST 57 (*)    Total Bilirubin 1.4 (*)    All other components within normal limits  CBC WITH DIFFERENTIAL/PLATELET - Abnormal; Notable for the following components:   WBC 22.2 (*)    RBC 3.28 (*)    Hemoglobin 9.8 (*)    HCT 31.1 (*)    Neutro Abs 18.7 (*)    Monocytes Absolute 1.8 (*)    Abs Immature Granulocytes 0.33 (*)    All other components within normal limits  PROTIME-INR - Abnormal; Notable for the following components:   Prothrombin Time 17.2 (*)    INR 1.4 (*)    All other components within normal limits  I-STAT CREATININE, ED - Abnormal; Notable for the following components:   Creatinine, Ser 0.40 (*)    All other components within normal limits  POC OCCULT BLOOD, ED - Abnormal; Notable for the following components:   Fecal Occult Bld POSITIVE (*)    All other components within normal limits  CULTURE, BLOOD (ROUTINE X 2)  CULTURE, BLOOD (ROUTINE X 2)  URINE CULTURE  LACTIC ACID, PLASMA  LACTIC ACID, PLASMA  URINALYSIS, ROUTINE W REFLEX  MICROSCOPIC  MAGNESIUM  LIPASE, BLOOD  CBC  I-STAT TROPONIN, ED  CBG MONITORING, ED  TYPE AND SCREEN  PREPARE RBC (CROSSMATCH)    EKG EKG Interpretation  Date/Time:  Monday May 22 2018 12:54:24 EST Ventricular Rate:  84 PR Interval:    QRS Duration: 99 QT Interval:  433 QTC Calculation: 512 R Axis:   -35 Text Interpretation:  Sinus rhythm Left axis deviation Low voltage, precordial leads Probable anteroseptal infarct, old Prolonged QT interval Confirmed by Sherwood Gambler [phone]) on 05/22/2018 12:57:42 PM Also confirmed by Sherwood Gambler [phone]), editor Hattie Perch ([id number])  on 05/22/2018 1:31:48 PM   Radiology Dg Chest Portable 1 View  Result Date: 05/22/2018 CLINICAL DATA:  Chest and back pain, shortness of breath, septic shock versus dissection EXAM: PORTABLE CHEST 1 VIEW COMPARISON:  Portable exam 1324 hours compared to 01/09/2017 FINDINGS: Rotated severely to the LEFT. Cardiac silhouette appears enlarged. Perihilar infiltrates question pulmonary edema. LEFT lower lobe inadequately evaluated No gross pneumothorax. Bones demineralized. IMPRESSION: Question pulmonary edema. Inadequate evaluation of LEFT lower lobe. Electronically Signed   By: Lavonia Dana M.D.   On: 05/22/2018 13:39   Ct Angio Chest/abd/pel For Dissection W And/or Wo Contrast  Result Date: 05/22/2018 CLINICAL DATA:  Fever and emesis. EXAM: CT ANGIOGRAPHY CHEST, ABDOMEN AND PELVIS TECHNIQUE: Multidetector CT imaging through the chest, abdomen and pelvis was performed using the standard protocol during bolus administration of intravenous contrast. Multiplanar reconstructed images and MIPs were obtained  and reviewed to evaluate the vascular anatomy. CONTRAST:  118m OMNIPAQUE IOHEXOL 350 MG/ML SOLN COMPARISON:  CTA chest dated September 06, 2017. FINDINGS: CTA CHEST FINDINGS Cardiovascular: Normal heart size. No pericardial effusion. Unchanged 4.1 cm ascending thoracic aortic aneurysm. No thoracic aortic dissection or  intramural hematoma. No central pulmonary embolism. Mediastinum/Nodes: No enlarged mediastinal, hilar, or axillary lymph nodes. Thyroid gland, trachea, and esophagus demonstrate no significant findings. Lungs/Pleura: New mild interlobular septal thickening in the lung apices. Bilateral lower lobe subsegmental atelectasis. Unchanged scarring in the lingula. No focal consolidation, pleural effusion, or pneumothorax. No suspicious pulmonary nodule. Musculoskeletal: No chest wall abnormality. No acute or significant osseous findings. Old T3, T7, T9, and T12 compression deformities. Prior T12 kyphoplasty. Review of the MIP images confirms the above findings. CTA ABDOMEN AND PELVIS FINDINGS VASCULAR Aorta: Normal caliber aorta without aneurysm, dissection, vasculitis or significant stenosis. Calcific atherosclerosis. Celiac: Patent without evidence of aneurysm, dissection, vasculitis or significant stenosis. SMA: Patent without evidence of aneurysm, dissection, vasculitis or significant stenosis. Renals: Two left and two right renal arteries are patent without evidence of aneurysm, dissection, vasculitis, fibromuscular dysplasia or significant stenosis. IMA: Patent without evidence of aneurysm, dissection, vasculitis or significant stenosis. Inflow: Patent without evidence of aneurysm, dissection, vasculitis or significant stenosis. Veins: No obvious venous abnormality within the limitations of this arterial phase study. Review of the MIP images confirms the above findings. NON-VASCULAR Hepatobiliary: Diffuse fatty infiltration of the liver. No focal liver abnormality. Status post cholecystectomy. No biliary dilatation. Pancreas: Mild atrophy. No ductal dilatation or surrounding inflammatory changes. Spleen: Normal in size without focal abnormality. Adrenals/Urinary Tract: Adrenal glands are unremarkable. Kidneys are normal, without renal calculi, focal lesion, or hydronephrosis. Bladder is unremarkable. Stomach/Bowel:  Stomach is within normal limits. Appendix is normal. No evidence of bowel wall thickening, distention, or inflammatory changes. Mild sigmoid colonic diverticulosis. Lymphatic: No enlarged abdominal or pelvic lymph nodes. Reproductive: Status post hysterectomy. No adnexal masses. Other: Tiny fat containing umbilical hernia. No free fluid or pneumoperitoneum. Musculoskeletal: No acute or significant osseous findings. Chronic L1 compression deformity. Review of the MIP images confirms the above findings. IMPRESSION: Vascular: 1. No evidence of thoracoabdominal dissection, intramural hematoma, or penetrating ulcer. 2. Unchanged 4.1 cm ascending thoracic aortic aneurysm. Recommend annual imaging followup by CTA or MRA. This recommendation follows 2010 ACCF/AHA/AATS/ACR/ASA/SCA/SCAI/SIR/STS/SVM Guidelines for the Diagnosis and Management of Patients with Thoracic Aortic Disease. Circulation. 2010; 121:: J941-D408 Aortic aneurysm NOS (ICD10-I71.9) 3.  Aortic atherosclerosis (ICD10-I70.0). Chest: 1. Mild interstitial pulmonary edema in the lung apices. Abdomen and pelvis: 1.  No acute intra-abdominal process. 2. Hepatic steatosis. Electronically Signed   By: WTitus DubinM.D.   On: 05/22/2018 15:05    Procedures .Critical Care Performed by: GSherwood Gambler MD Authorized by: GSherwood Gambler MD   Critical care provider statement:    Critical care time (minutes):  60   Critical care time was exclusive of:  Separately billable procedures and treating other patients   Critical care was necessary to treat or prevent imminent or life-threatening deterioration of the following conditions:  Shock, circulatory failure and sepsis   Critical care was time spent personally by me on the following activities:  Development of treatment plan with patient or surrogate, discussions with consultants, evaluation of patient's response to treatment, examination of patient, obtaining history from patient or surrogate, ordering and  performing treatments and interventions, ordering and review of laboratory studies, ordering and review of radiographic studies, pulse oximetry, re-evaluation of patient's condition and review of old charts   (  including critical care time)  Medications Ordered in ED Medications  sodium chloride flush (NS) 0.9 % injection 3 mL (has no administration in time range)  metroNIDAZOLE (FLAGYL) IVPB 500 mg (500 mg Intravenous New Bag/Given 05/22/18 1245)  vancomycin (VANCOCIN) IVPB 1000 mg/200 mL premix (has no administration in time range)  aztreonam (AZACTAM) 2 g in sodium chloride 0.9 % 100 mL IVPB (has no administration in time range)  sodium chloride 0.9 % bolus 1,000 mL (1,000 mLs Intravenous New Bag/Given 05/22/18 1246)    And  sodium chloride 0.9 % bolus 1,000 mL (1,000 mLs Intravenous New Bag/Given 05/22/18 1247)    And  sodium chloride 0.9 % bolus 1,000 mL (has no administration in time range)     Initial Impression / Assessment and Plan / ED Course  I have reviewed the triage vital signs and the nursing notes.  Pertinent labs & imaging results that were available during my care of the patient were reviewed by me and considered in my medical decision making (see chart for details).        Patient presents with severe shock.  Given the fever reported at home, she was treated broadly for sepsis.  She was given 30 cc/KG IV fluids.  With this her blood pressure did come up and now is pseudo-stable around 95 systolic or 329 systolic.  She does feel better.  No clear source at this time, given prior history of aneurysm/dilatation of her a sending aorta, CT dissection protocol obtained but does not show obvious etiology.  Urine could be contributing.  She is also noted to have severe electrolyte disturbances including potassium and calcium of very low values.  These will be repleted orally start to be repleted.  Hemoglobin is 5 points lower than her typical and so rectal exam performed without gross  blood or melena.  However I think is reasonable to give blood as this is probably hemoconcentrated based on her shock state.  She appears significantly ill and will need ICU management.  I discussed with the intensivist who will admit.  Final Clinical Impressions(s) / ED Diagnoses   Final diagnoses:  Shock ([place])  Hypokalemia  Hypocalcemia    ED Discharge Orders    None       Sherwood Gambler, MD 05/22/18 [phone]

## 2018-05-22 NOTE — H&P (Signed)
 NAME:  Angela [name], MRN:  [id number], DOB:  06/04/1932, LOS: 0 ADMISSION DATE:  05/22/2018, CONSULTATION DATE:  05/22/2018 REFERRING MD:  Ed doc, CHIEF COMPLAINT:  hypotension   Brief History   85-year-old lady with multiple comorbidities who came into the hospital with altered mentation, profoundly hypotensive on initial evaluation Was well until going to bed yesterday Was out in about the day before with no significant limitations History of breast cancer, history of hypertension, history of diabetes, hyperlipidemia left breast cancer Pulmonary embolism in 2017  History of present illness   She felt well at the time of going to bed yesterday woke up in the middle of the night not able to control her bladder functions, fever 101 Found to be very hypotensive with systolic in the 50s when she came into the emergency department Fluid resuscitated she is responding well to fluids Mental status back to baseline  Past Medical History   Past Medical History:  Diagnosis Date  . Arthritis   . BRCA2 positive 10/214  . Breast cancer (HCC) 1994   left sided cancer; unilateral mastectomy, no chemo or radiation  . Diabetes mellitus   . Dyslipidemia   . GERD (gastroesophageal reflux disease)   . Heart murmur   . Hypertension    Significant Hospital Events   Hypotension Multiple electrolyte derangements-responding to intervention  Consults:  pccm 05/22/2018  Procedures:    Significant Diagnostic Tests:  CT abdomen and pelvis-results reviewed  Micro Data:  Blood culture 05/22/2018   Antimicrobials:  Metronidazole 3/2 Vancomycin 3/2 Aztreonam  Interim history/subjective:  Responding to fluid resuscitation  Objective   Blood pressure (!) 109/54, pulse 84, temperature 98.5 F (36.9 C), temperature source Oral, resp. rate 15, height 5' 8" (1.727 m), weight 99.8 kg, SpO2 97 %.        Intake/Output Summary (Last 24 hours) at 05/22/2018 1512 Last data filed at 05/22/2018  1415 Gross per 24 hour  Intake 3475 ml  Output -  Net 3475 ml   Filed Weights   05/22/18 1258  Weight: 99.8 kg    Examination: General: Elderly lady, does not appear to be in distress HENT: Moist oral mucosa Lungs: Good air entry bilaterally, decreased at the bases Cardiovascular: S1-S2 appreciated Abdomen: Bowel sounds appreciated Extremities: No clubbing, no edema Neuro: Alert and oriented x3 GU: incontinent in to this morning  Resolved Hospital Problem list    Assessment & Plan:  Hypotension -Likely related to sepsis -Source is unclear at the present time but likely related to urinary tract infection -Fever with incontinence -Has no respiratory or GI symptoms -Responding to fluid challenge  Sepsis -Follow-up on cultures -Did receive vancomycin, aztreonam, metronidazole -We will need to de-escalate antibiotics once cultures are available  Fever/leukocytosis -Unclear source, likely urinary tract infection -Trend CBC  Hypertension -Hold antihypertensives at present -Reinitiate once blood pressure stabilizes  Hypokalemia Hypocalcemia -Correcting with KCl IV and oral -Received calcium gluconate -Trend levels  History of breast cancer -Stable  History of pulmonary embolism -We will continue Eliquis -Has no evidence of acute bleeding -CT abdomen not showing any collection  Hypothyroidism -Continue Synthroid  Best practice:  Diet: Heart healthy carb modified Pain/Anxiety/Delirium protocol (if indicated): As needed VAP protocol (if indicated): Not indicated DVT prophylaxis: SCD GI prophylaxis: Pepcid nightly Glucose control: SSI Mobility: Bedrest Code Status: Full code Family Communication: Discussed with sister at bedside Disposition: ICU  Labs   CBC: Recent Labs  Lab 05/22/18 1240  WBC 22.2*  NEUTROABS 18.7*    HGB 9.8*  HCT 31.1*  MCV 94.8  PLT 151    Basic Metabolic Panel: Recent Labs  Lab 05/22/18 1240 05/22/18 1257  NA 139  --    K <2.0*  --   CL 111  --   CO2 22  --   GLUCOSE 130*  --   BUN 15  --   CREATININE 0.57 0.40*  CALCIUM 5.6*  --    GFR: Estimated Creatinine Clearance: 65.9 mL/min (A) (by C-G formula based on SCr of 0.4 mg/dL (L)). Recent Labs  Lab 05/22/18 1239 05/22/18 1240  WBC  --  22.2*  LATICACIDVEN 1.1  --     Liver Function Tests: Recent Labs  Lab 05/22/18 1240  AST 57*  ALT 41  ALKPHOS 85  BILITOT 1.4*  PROT 4.2*  ALBUMIN 2.3*   No results for input(s): LIPASE, AMYLASE in the last 168 hours. No results for input(s): AMMONIA in the last 168 hours.  ABG    Component Value Date/Time   TCO2 30 02/18/2010 0818     Coagulation Profile: Recent Labs  Lab 05/22/18 1240  INR 1.4*    Cardiac Enzymes: No results for input(s): CKTOTAL, CKMB, CKMBINDEX, TROPONINI in the last 168 hours.  HbA1C: Hgb A1c MFr Bld  Date/Time Value Ref Range Status  01/31/2018 01:49 PM 6.0 4.6 - 6.5 % Final    Comment:    Glycemic Control Guidelines for People with Diabetes:Non Diabetic:  <6%Goal of Therapy: <7%Additional Action Suggested:  >8%   06/06/2017 04:06 PM 6.0 4.6 - 6.5 % Final    Comment:    Glycemic Control Guidelines for People with Diabetes:Non Diabetic:  <6%Goal of Therapy: <7%Additional Action Suggested:  >8%     CBG: No results for input(s): GLUCAP in the last 168 hours.  Review of Systems:   Review of Systems  Constitutional: Positive for fever.  HENT: Negative.   Eyes: Negative.   Respiratory: Negative.   Cardiovascular: Negative.   Gastrointestinal: Negative.  Negative for constipation and diarrhea.  Genitourinary:       Incontinence  All other systems reviewed and are negative.   Past Medical History  She,  has a past medical history of Arthritis, BRCA2 positive (10/214), Breast cancer (HCC) (1994), Diabetes mellitus, Dyslipidemia, GERD (gastroesophageal reflux disease), Heart murmur, and Hypertension.   Surgical History    Past Surgical History:  Procedure  Laterality Date  . CARDIAC CATHETERIZATION     NORMAL CORONARY ARTERIES  . CHOLECYSTECTOMY    . MASTECTOMY     LEFT BREAST  . TONSILLECTOMY       Social History   reports that she has never smoked. She has never used smokeless tobacco. She reports that she does not drink alcohol or use drugs.   Family History   Her family history includes Bladder Cancer (age of onset: 71) in her father; Bone cancer in her paternal grandmother; Breast cancer in her cousin; Heart attack in her maternal grandmother; Hypertension in her sister; Lung cancer in her maternal aunt; Lung cancer (age of onset: 59) in her brother; Ovarian cancer (age of onset: 55) in her sister; Ovarian cancer (age of onset: 62) in her maternal aunt; Pancreatic cancer (age of onset: 69) in her mother; Stomach cancer in her maternal grandfather and paternal grandfather. There is no history of Heart disease.   Allergies Allergies  Allergen Reactions  . Gadolinium Nausea And Vomiting     pt states she had nausea after receiving MAGNEVIST for breast imaging   .   Pravastatin Other (See Comments)    Locked jaw, weakness  . Simvastatin Other (See Comments)    Weakness and locked jaw.  Marland Kitchen Crestor [Rosuvastatin Calcium] Other (See Comments)    Abdominal discomfort  . Penicillins Swelling and Rash    Has patient had a PCN reaction causing immediate rash, facial/tongue/throat swelling, SOB or lightheadedness with hypotension: yes Has patient had a PCN reaction causing severe rash involving mucus membranes or skin necrosis: unknown Has patient had a PCN reaction that required hospitalization: unknown Has patient had a PCN reaction occurring within the last 10 years: no If all of the above answers are "NO", then may proceed with Cephalosporin use.      Home Medications  Prior to Admission medications   Medication Sig Start Date End Date Taking? Authorizing Provider  amoxicillin (AMOXIL) 500 MG capsule Take 500 mg by mouth daily. 04/18/18    [provider]  benazepril-hydrochlorthiazide (LOTENSIN HCT) 20-12.5 MG tablet TAKE 1 TABLET ONCE DAILY. Patient taking differently: Take 1 tablet by mouth daily.  05/05/18   [name], MD  ELIQUIS 5 MG TABS tablet TAKE 1 TABLET BY MOUTH TWICE DAILY. Patient taking differently: Take 5 mg by mouth 2 (two) times daily.  04/03/18   [name], MD  furosemide (LASIX) 20 MG tablet 1 TABLET ONCE DAILY FOR EDEMA (IF WEIGHT HIGH BY 2 POUNDS IN 24 HRS, WEIGH DAILY.) Patient taking differently: Take 20 mg by mouth daily as needed for fluid or edema (If weight gain by 2lbs in 24 hours).  03/13/18   [name], MD  isosorbide mononitrate (IMDUR) 30 MG 24 hr tablet TAKE 1 TABLET ONCE DAILY. Patient not taking: No sig reported 12/09/17   [name], MD  isosorbide mononitrate (IMDUR) 30 MG 24 hr tablet TAKE 1 TABLET ONCE DAILY. Patient not taking: Reported on 05/22/2018 01/02/18   [name], MD  isosorbide mononitrate (IMDUR) 30 MG 24 hr tablet TAKE 1 TABLET ONCE DAILY. Patient not taking: Reported on 05/22/2018 01/31/18   [name], MD  isosorbide mononitrate (IMDUR) 30 MG 24 hr tablet TAKE 1 TABLET ONCE DAILY. Patient not taking: Reported on 05/22/2018 03/10/18   [name], MD  isosorbide mononitrate (IMDUR) 30 MG 24 hr tablet TAKE 1 TABLET ONCE DAILY. Patient taking differently: Take 30 mg by mouth daily.  04/03/18   [name], MD  LORazepam (ATIVAN) 0.5 MG tablet TAKE (1) TABLET TWICE DAILY AS NEEDED FOR ANXIETY. Patient taking differently: Take 0.5 mg by mouth 2 (two) times daily as needed for anxiety. TAKE (1) TABLET TWICE DAILY AS NEEDED FOR ANXIETY. 05/26/17   [name], MD  meloxicam (MOBIC) 15 MG tablet Take 15 mg by mouth daily. 01/05/17   [provider]  pantoprazole (PROTONIX) 40 MG tablet TAKE 1 TABLET ONCE DAILY. Patient taking differently: Take 40 mg by mouth daily.  04/03/18   [name], MD  PERCOCET 10-325 MG per tablet Take 0.25-1 tablets by mouth every 6 (six)  hours as needed for pain.  02/19/14   [provider]  polyethylene glycol (MIRALAX / GLYCOLAX) packet Take 17 g by mouth 3 (three) times daily as needed. Patient taking differently: Take 17 g by mouth 3 (three) times daily as needed for moderate constipation.  05/26/12   Drenda Freeze, MD  SYNTHROID 125 MCG tablet TAKE 1 TABLET IN THE MORNING WITH BREAKFAST. Patient taking differently: Take 125 mcg by mouth daily before breakfast.  04/03/18   [name], MD  VENTOLIN HFA 108 (  90 Base) MCG/ACT inhaler USE 2 PUFFS 4 TIMES A DAY AS NEEDED FOR WHEEZE OR SHORTNESS OF BREATH. Patient taking differently: Inhale 2 puffs into the lungs 4 (four) times daily as needed for wheezing or shortness of breath.  03/23/18   Wohl, Darren, MD  Vitamin D, Ergocalciferol, (DRISDOL) 1.25 MG (50000 UT) CAPS capsule TAKE ONE CAPSULE ONCE A WEEK. Patient taking differently: Take 50,000 Units by mouth every 7 (seven) days.  04/03/18   Kumar, Ajay, MD  VOLTAREN 1 % GEL Apply 2 g topically 4 (four) times daily as needed (pain). To knees 02/06/14   [provider]     Critical care time: 30minutes spent evaluating patient, reviewing records, formulating plan of care        

## 2018-05-23 ENCOUNTER — Inpatient Hospital Stay (HOSPITAL_COMMUNITY): Payer: Medicare Other

## 2018-05-23 DIAGNOSIS — R6521 Severe sepsis with septic shock: Secondary | ICD-10-CM

## 2018-05-23 LAB — BASIC METABOLIC PANEL
Anion gap: 4 — ABNORMAL LOW (ref 5–15)
BUN: 10 mg/dL (ref 8–23)
CO2: 27 mmol/L (ref 22–32)
Calcium: 7.8 mg/dL — ABNORMAL LOW (ref 8.9–10.3)
Chloride: 107 mmol/L (ref 98–111)
Creatinine, Ser: 0.51 mg/dL (ref 0.44–1.00)
GFR calc Af Amer: >60 mL/min (ref >60)
GLUCOSE: 123 mg/dL — AB (ref 70–99)
Potassium: 3.8 mmol/L (ref 3.5–5.1)
Sodium: 138 mmol/L (ref 135–145)

## 2018-05-23 LAB — PHOSPHORUS: Phosphorus: 2.6 mg/dL (ref 2.5–4.6)

## 2018-05-23 LAB — MAGNESIUM: Magnesium: 1.6 mg/dL — ABNORMAL LOW (ref 1.7–2.4)

## 2018-05-23 LAB — CBC
HCT: 37.2 % (ref 36.0–46.0)
Hemoglobin: 11.8 g/dL — ABNORMAL LOW (ref 12.0–15.0)
MCH: 30.6 pg (ref 26.0–34.0)
MCHC: 31.7 g/dL (ref 30.0–36.0)
MCV: 96.4 fL (ref 80.0–100.0)
Platelets: 143 10*3/uL — ABNORMAL LOW (ref 150–400)
RBC: 3.86 MIL/uL — ABNORMAL LOW (ref 3.87–5.11)
RDW: 13.5 % (ref 11.5–15.5)
WBC: 9.7 10*3/uL (ref 4.0–10.5)
nRBC: 0 % (ref 0.0–0.2)

## 2018-05-23 LAB — ABO/RH: ABO/RH(D): A POS

## 2018-05-23 MED ORDER — MAGNESIUM SULFATE 2 GM/50ML IV SOLN
2.0000 g | Freq: Once | INTRAVENOUS | Status: AC
  Administered 2018-05-23: 2 g via INTRAVENOUS
  Filled 2018-05-23: qty 50

## 2018-05-23 MED ORDER — IOPAMIDOL (ISOVUE-300) INJECTION 61%
50.0000 mL | Freq: Once | INTRAVENOUS | Status: AC | PRN
  Administered 2018-05-23: 50 mL via INTRAVENOUS

## 2018-05-23 MED ORDER — IOHEXOL 300 MG/ML  SOLN: 50.0000 mL | Freq: Once | INTRAMUSCULAR | Status: DC | PRN

## 2018-05-23 MED ORDER — SODIUM CHLORIDE (PF) 0.9 % IJ SOLN
INTRAMUSCULAR | Status: AC
  Filled 2018-05-23: qty 100

## 2018-05-23 MED ORDER — IOPAMIDOL (ISOVUE-300) INJECTION 61%
INTRAVENOUS | Status: AC
  Filled 2018-05-23: qty 50

## 2018-05-23 MED ORDER — SODIUM CHLORIDE 0.9 % IV SOLN
3.0000 g | Freq: Four times a day (QID) | INTRAVENOUS | Status: DC
  Administered 2018-05-23 – 2018-05-25 (×7): 3 g via INTRAVENOUS
  Filled 2018-05-23 (×9): qty 3

## 2018-05-23 MED ORDER — BENZOCAINE 10 % MT GEL
Freq: Four times a day (QID) | OROMUCOSAL | Status: DC | PRN
  Administered 2018-05-23 (×2): 1 via OROMUCOSAL
  Administered 2018-05-23: 12:00:00 via OROMUCOSAL
  Administered 2018-05-24: 1 via OROMUCOSAL
  Administered 2018-05-24: 22:00:00 via OROMUCOSAL
  Filled 2018-05-23: qty 9.4

## 2018-05-23 NOTE — Progress Notes (Signed)
Pharmacy Antibiotic Note  Angela [name] is a 85 y.o. female admitted on 05/22/2018 with AMS and fever with initial antibiotic treatment for sepsis with unclear source including aztreonam, metronidazole, and vancomycin. After discussion with CCM, will transition to Unasyn and vancomycin as patient tolerates penicillins (PCN allergy listed in chart) to cover for possible sources of infection including possible tooth abscess and possible joint infection. Pharmacy has been consulted for Unasyn dosing in addition to vancomycin.   Today 05/23/18  -  WBC down to 9.7 - afebrile  - SCr stable, WNL   Plan: Unasyn 3 g iv q 6h.   Continue vancomycin 1000 mg iv q 24h.  Consider d/c after 48h of negative cultures or if possibility of joint infection is ruled out  F/U culture results, renal function    Height: 5\' 8"  (172.7 cm) Weight: 253 lb 1.4 oz (114.8 kg) IBW/kg (Calculated) : 63.9  Temp (24hrs), Avg:98.3 F (36.8 C), Min:98 F (36.7 C), Max:98.5 F (36.9 C)  Recent Labs  Lab 05/22/18 1239 05/22/18 1240 05/22/18 1257 05/22/18 1338 05/22/18 1738 05/23/18 0825  WBC  --  22.2*  --   --  22.0* 9.7  CREATININE  --  0.57 0.40*  --   --  0.51  LATICACIDVEN 1.1  --   --  1.1  --   --     Estimated Creatinine Clearance: 70.9 mL/min (by C-G formula based on SCr of 0.51 mg/dL).    Allergies  Allergen Reactions  . Crestor [Rosuvastatin Calcium] Anaphylaxis and Other (See Comments)    Abdominal discomfort  . Gadolinium Nausea And Vomiting     pt states she had nausea after receiving MAGNEVIST for breast imaging   . Pravastatin Other (See Comments)    Locked jaw, weakness  . Simvastatin Other (See Comments)    Weakness and locked jaw.  . Penicillins Swelling and Rash    Has patient had a PCN reaction causing immediate rash, facial/tongue/throat swelling, SOB or lightheadedness with hypotension: yes Has patient had a PCN reaction causing severe rash involving mucus membranes or skin  necrosis: unknown Has patient had a PCN reaction that required hospitalization: unknown Has patient had a PCN reaction occurring within the last 10 years: no If all of the above answers are "NO", then may proceed with Cephalosporin use.     Antimicrobials this admission:  vancomycin 3/2 >>  aztreonam 3/2 >> 3/3 Flagyl 3/2 >> 3.3 Unasyn 3/3 >>  Dose adjustments this admission:    Microbiology results:  3/2 BCx:  3/2 UCx:   3/2 MRSA PCR: negative   Thank you for allowing pharmacy to be a part of this patient's care.  Ulice Dash D 05/23/2018 12:03 PM

## 2018-05-23 NOTE — Progress Notes (Signed)
Gums surrounding (2) teeth on LEFT UPPER side of mouth are very painful for Pt and appear swollen, black and green in color w/ blood transfer when wiped  Needs to be evaluated by MD

## 2018-05-23 NOTE — Progress Notes (Signed)
NAME:  Angela [name], MRN:  [id number], DOB:  [birth year], LOS: 1 ADMISSION DATE:  05/22/2018, CONSULTATION DATE:  05/22/2018 REFERRING MD:  Jaquita Rector doc, CHIEF COMPLAINT:  hypotension   Brief History   85 year old F with multiple comorbidities admitted 3/2 with AMS, fever to 101 and hypotension to the 50's.  She was admitted with concern for sepsis with unclear source.    Pt reports she developed a sore left upper tooth on Sunday 3/1. Went to bed and was in normal state of health.  Woke with urinary incontinence.  Had taken a percocet.  Additionally, took her blood pressure medications prior to coming to the hospital on 3/2. She also recently had a knee injection (she frequently receives steroids, ~ 1x per month).  She also took amoxicillin for her tooth the evening prior to admit.   Past Medical History  PE - 2017  Breast Cancer  HTN  DM  [facility]   3/02  Admit with concern for sepsis   Consults:  PCCM 3/2   Procedures:    Significant Diagnostic Tests:  CTA Chest/Abd/Pelvis 3/2 >> no evidence of intraabdominal dissection, intramural hematoma or penetrating ulcer, unchanged 4.1cm ascending thoracic aortic aneurysm, mild interstitial pulmonary edema, no acute abdominal process.  Hepatic steatosis.   Micro Data:  BCx2 3/2 >>  UC 3/2 >>   Antimicrobials:  Metronidazole 3/2 >> 3/3 Vancomycin 3/2 >> Aztreonam 3/2 >>   Interim history/subjective:  Pt reports feeling better.  States her face is swollen, tooth is sore.  She is concerned her tooth is what made her sick.   Objective   Blood pressure (!) 110/36, pulse 63, temperature 98 F (36.7 C), temperature source Oral, resp. rate 14, height 5\' 8"  (1.727 m), weight 114.8 kg, SpO2 98 %.        Intake/Output Summary (Last 24 hours) at 05/23/2018 1032 Last data filed at 05/23/2018 0900 Gross per 24 hour  Intake 6593.49 ml  Output -  Net 6593.49 ml   Filed Weights   05/22/18 1258 05/22/18 1711  Weight:  99.8 kg 114.8 kg    Examination: General: elderly female lying in bed in NAD  HEENT: MM pink/moist, left upper posterior teeth gumline with raised area, concern for possible abscess  Neuro: AAOx4, speech clear, MAE  CV: s1s2 rrr, no m/r/g PULM: even/non-labored, lungs bilaterally clear  XI:PJAS, non-tender, bsx4 active  Extremities: warm/dry, 1-2+ generalized edema  Skin: thin, fragile skin, multiple areas of bruising, ecchymosis on LLE, R foot  Resolved Hospital Problem list   Hypotension   Assessment & Plan:   Septic Shock  -shock resolved 3/2 -compounded by narcotics and BP regimen -urine negative, CT chest/abd/pelvis negative  -concern for possible tooth abscess, frequent knee injections, consider endocarditis  P: Await cultures  Assess maxillofacial CT with reduced contrast, discussed with Radiology  Narrow antibiotics to vancomycin + unasyn > discussed with Pharmacy, to allow for anaerobic, GP & GN coverage.   If blood cultures negative, likely no further investigation for endocarditis needed but if she is clinically not improving or culture positive, may need to consider TEE given tooth involvement / steroid injections  Hypertension P: Continue imdur Hold home benazepril-HCTZ, lasix,  Transition to SDU monitoring   At Risk AKI - in setting of hypotension, contrast administration Hypokalemia Hypocalcemia Hypomagnesemia  P: Trend BMP / urinary output Replace electrolytes as indicated Avoid nephrotoxic agents, ensure adequate renal perfusion  History of breast cancer -remote, >20 years ago  P: No acute intervention  History of PE on Eliquis P: Continue eliquis   Hypothyroidism P: Continue synthroid   Best practice:  Diet: Heart healthy carb modified Pain/Anxiety/Delirium protocol (if indicated): As needed VAP protocol (if indicated): Not indicated DVT prophylaxis: SCD GI prophylaxis: Pepcid nightly Glucose control: n/a  Mobility: Bedrest Code  Status: Full code Family Communication: Patient and family updated at bedside.  Disposition: SDU  Labs   CBC: Recent Labs  Lab 05/22/18 1240 05/22/18 1738 05/23/18 0825  WBC 22.2* 22.0* 9.7  NEUTROABS 18.7*  --   --   HGB 9.8* 12.2 11.8*  HCT 31.1* 37.5 37.2  MCV 94.8 95.7 96.4  PLT 151 183 143*    Basic Metabolic Panel: Recent Labs  Lab 05/22/18 1240 05/22/18 1257 05/22/18 1516 05/23/18 0825  NA 139  --   --  138  K <2.0*  --   --  3.8  CL 111  --   --  107  CO2 22  --   --  27  GLUCOSE 130*  --   --  123*  BUN 15  --   --  10  CREATININE 0.57 0.40*  --  0.51  CALCIUM 5.6*  --   --  7.8*  MG  --   --  1.1* 1.6*  PHOS  --   --   --  2.6   GFR: Estimated Creatinine Clearance: 70.9 mL/min (by C-G formula based on SCr of 0.51 mg/dL). Recent Labs  Lab 05/22/18 1239 05/22/18 1240 05/22/18 1338 05/22/18 1738 05/23/18 0825  WBC  --  22.2*  --  22.0* 9.7  LATICACIDVEN 1.1  --  1.1  --   --     Liver Function Tests: Recent Labs  Lab 05/22/18 1240  AST 57*  ALT 41  ALKPHOS 85  BILITOT 1.4*  PROT 4.2*  ALBUMIN 2.3*   Recent Labs  Lab 05/22/18 1516  LIPASE 26   No results for input(s): AMMONIA in the last 168 hours.  ABG    Component Value Date/Time   TCO2 30 02/18/2010 0818     Coagulation Profile: Recent Labs  Lab 05/22/18 1240  INR 1.4*    Cardiac Enzymes: No results for input(s): CKTOTAL, CKMB, CKMBINDEX, TROPONINI in the last 168 hours.  HbA1C: Hgb A1c MFr Bld  Date/Time Value Ref Range Status  01/31/2018 01:49 PM 6.0 4.6 - 6.5 % Final    Comment:    Glycemic Control Guidelines for People with Diabetes:Non Diabetic:  <6%Goal of Therapy: <7%Additional Action Suggested:  >8%   06/06/2017 04:06 PM 6.0 4.6 - 6.5 % Final    Comment:    Glycemic Control Guidelines for People with Diabetes:Non Diabetic:  <6%Goal of Therapy: <7%Additional Action Suggested:  >8%     CBG: No results for input(s): GLUCAP in the last 168  hours.   Critical care time:  n/a   Noe Gens, NP-C [facility] Pgr: [phone] or if no answer [phone] 05/23/2018, 10:33 AM

## 2018-05-24 DIAGNOSIS — E876 Hypokalemia: Secondary | ICD-10-CM

## 2018-05-24 DIAGNOSIS — E039 Hypothyroidism, unspecified: Secondary | ICD-10-CM

## 2018-05-24 DIAGNOSIS — I959 Hypotension, unspecified: Secondary | ICD-10-CM

## 2018-05-24 DIAGNOSIS — R579 Shock, unspecified: Secondary | ICD-10-CM

## 2018-05-24 DIAGNOSIS — I10 Essential (primary) hypertension: Secondary | ICD-10-CM

## 2018-05-24 LAB — COMPREHENSIVE METABOLIC PANEL
ALT: 43 U/L (ref 0–44)
AST: 29 U/L (ref 15–41)
Albumin: 2.5 g/dL — ABNORMAL LOW (ref 3.5–5.0)
Alkaline Phosphatase: 86 U/L (ref 38–126)
Anion gap: 7 (ref 5–15)
BUN: 6 mg/dL — AB (ref 8–23)
CO2: 26 mmol/L (ref 22–32)
Calcium: 7.9 mg/dL — ABNORMAL LOW (ref 8.9–10.3)
Chloride: 106 mmol/L (ref 98–111)
Creatinine, Ser: 0.51 mg/dL (ref 0.44–1.00)
GFR calc Af Amer: >60 mL/min (ref >60)
GFR calc non Af Amer: >60 mL/min (ref >60)
Glucose, Bld: 124 mg/dL — ABNORMAL HIGH (ref 70–99)
Potassium: 3.6 mmol/L (ref 3.5–5.1)
Sodium: 139 mmol/L (ref 135–145)
Total Bilirubin: 0.7 mg/dL (ref 0.3–1.2)
Total Protein: 4.9 g/dL — ABNORMAL LOW (ref 6.5–8.1)

## 2018-05-24 LAB — CBC
HCT: 34.3 % — ABNORMAL LOW (ref 36.0–46.0)
Hemoglobin: 11 g/dL — ABNORMAL LOW (ref 12.0–15.0)
MCH: 30.5 pg (ref 26.0–34.0)
MCHC: 32.1 g/dL (ref 30.0–36.0)
MCV: 95 fL (ref 80.0–100.0)
Platelets: 150 10*3/uL (ref 150–400)
RBC: 3.61 MIL/uL — ABNORMAL LOW (ref 3.87–5.11)
RDW: 13.3 % (ref 11.5–15.5)
WBC: 11.4 10*3/uL — ABNORMAL HIGH (ref 4.0–10.5)
nRBC: 0 % (ref 0.0–0.2)

## 2018-05-24 LAB — URINE CULTURE

## 2018-05-24 LAB — MAGNESIUM: Magnesium: 1.5 mg/dL — ABNORMAL LOW (ref 1.7–2.4)

## 2018-05-24 MED ORDER — FUROSEMIDE 20 MG PO TABS
20.0000 mg | ORAL_TABLET | Freq: Every day | ORAL | Status: DC
  Administered 2018-05-24 – 2018-05-25 (×2): 20 mg via ORAL
  Filled 2018-05-24 (×3): qty 1

## 2018-05-24 MED ORDER — MAGNESIUM SULFATE 2 GM/50ML IV SOLN
2.0000 g | Freq: Once | INTRAVENOUS | Status: AC
  Administered 2018-05-24: 2 g via INTRAVENOUS
  Filled 2018-05-24: qty 50

## 2018-05-24 NOTE — Progress Notes (Signed)
PROGRESS NOTE  Angela [name] EGB:[id number] DOB: [birth year] DOA: 05/22/2018 PCP: [name], MD   LOS: 2 days   Brief Narrative / Interim history: 85 year old female with history of PE on chronic anticoagulation, history of breast cancer, hypertension, diabetes, hyperlipidemia who was admitted to the hospital on 3/2 with profound hypotension and concern for septic shock.  She never required pressors and responded to fluids.  Subjective: She is feeling much better this morning, she denies any lightheadedness or dizziness.  She denies any fever or chills, no cough or chest congestion.  She denies any dysuria.  She has no abdominal pain, nausea or vomiting.  The only thing that she complains about is her left upper tooth has been bothering her  Assessment & Plan: Active Problems:   Sepsis ([place])   Principal Problem Concern for septic shock -Not sure if this is truly septic shock versus combination of narcotics as well as blood pressure regimen -Urinalysis was negative, CT of the chest abdomen pelvis were negative as well -Concern for possible tooth abscess but CT maxillofacial on 3/3 without soft tissue odontogenic abscesses identified and essentially negative.  She does have pain on the overlying gum,?  Very small infectious site not picked up by the CT scan.  She has an appointment as an outpatient with her dentist -For now keep on same antibiotics, if cultures remain negative may be able to narrow to Augmentin and send her home tomorrow  Active Problems Hypertension -Continue Imdur, blood pressure much better today when compared to admission -Hold Lotensin as well as furosemide  Hypothyroidism -Continue Synthroid  History of PE -Continue Eliquis  History of anxiety -Continue home Ativan  Prediabetes -Most recent A1c 6.0  Scheduled Meds: . apixaban  5 mg Oral BID  . Chlorhexidine Gluconate Cloth  6 each Topical Daily  . isosorbide mononitrate  30 mg Oral Daily  .  levothyroxine  125 mcg Oral QAC breakfast  . mouth rinse  15 mL Mouth Rinse BID  . pantoprazole  40 mg Oral Q1200   Continuous Infusions: . sodium chloride Stopped (05/23/18 1256)  . ampicillin-sulbactam (UNASYN) IV 3 g (05/24/18 0806)  . vancomycin Stopped (05/23/18 1255)   PRN Meds:.sodium chloride, albuterol, benzocaine, bisacodyl, diclofenac sodium, LORazepam, oxyCODONE-acetaminophen **AND** oxyCODONE  DVT prophylaxis: On Eliquis Code Status: Full code Family Communication: No family present at bedside Disposition Plan: Possibly home 24 hours  Consultants:   None  Procedures:   None   Antimicrobials:  Metronidazole 3/2-3/3  Vancomycin 3/2-  Aztreonam 3/2-3/3  Unasyn 3/3-  Objective: Vitals:   05/24/18 0400 05/24/18 0500 05/24/18 0800 05/24/18 1044  BP: (!) 152/55  (!) 145/74 115/61  Pulse: 70 65 64 65  Resp: 18 17 14 16   Temp:   98.3 F (36.8 C) 98.2 F (36.8 C)  TempSrc:   Oral Oral  SpO2: 96% 96% 96% 95%  Weight:  122.8 kg    Height:        Intake/Output Summary (Last 24 hours) at 05/24/2018 1116 Last data filed at 05/24/2018 0817 Gross per 24 hour  Intake 3009.03 ml  Output -  Net 3009.03 ml   Filed Weights   05/22/18 1258 05/22/18 1711 05/24/18 0500  Weight: 99.8 kg 114.8 kg 122.8 kg    Examination:  Constitutional: NAD Eyes: PERRL, lids and conjunctivae normal ENMT: Mucous membranes are moist.  Tenderness overlying left upper molar Respiratory: clear to auscultation bilaterally, no wheezing, no crackles. Normal respiratory effort.  Cardiovascular: Regular rate and rhythm,  no murmurs / rubs / gallops. No LE edema. 2+ pedal pulses.  Abdomen: no tenderness. Bowel sounds positive.  Musculoskeletal: no clubbing / cyanosis Skin: no rashes, lesions, ulcers. No induration Neurologic: CN 2-12 grossly intact. Strength 5/5 in all 4.  Psychiatric: Normal judgment and insight. Alert and oriented x 3. Normal mood.    Data Reviewed: I have  independently reviewed following labs and imaging studies   CBC: Recent Labs  Lab 05/22/18 1240 05/22/18 1738 05/23/18 0825 05/24/18 0306  WBC 22.2* 22.0* 9.7 11.4*  NEUTROABS 18.7*  --   --   --   HGB 9.8* 12.2 11.8* 11.0*  HCT 31.1* 37.5 37.2 34.3*  MCV 94.8 95.7 96.4 95.0  PLT 151 183 143* 696   Basic Metabolic Panel: Recent Labs  Lab 05/22/18 1240 05/22/18 1257 05/22/18 1516 05/23/18 0825 05/24/18 0306  NA 139  --   --  138 139  K <2.0*  --   --  3.8 3.6  CL 111  --   --  107 106  CO2 22  --   --  27 26  GLUCOSE 130*  --   --  123* 124*  BUN 15  --   --  10 6*  CREATININE 0.57 0.40*  --  0.51 0.51  CALCIUM 5.6*  --   --  7.8* 7.9*  MG  --   --  1.1* 1.6* 1.5*  PHOS  --   --   --  2.6  --    GFR: Estimated Creatinine Clearance: 73.6 mL/min (by C-G formula based on SCr of 0.51 mg/dL). Liver Function Tests: Recent Labs  Lab 05/22/18 1240 05/24/18 0306  AST 57* 29  ALT 41 43  ALKPHOS 85 86  BILITOT 1.4* 0.7  PROT 4.2* 4.9*  ALBUMIN 2.3* 2.5*   Recent Labs  Lab 05/22/18 1516  LIPASE 26   No results for input(s): AMMONIA in the last 168 hours. Coagulation Profile: Recent Labs  Lab 05/22/18 1240  INR 1.4*   Cardiac Enzymes: No results for input(s): CKTOTAL, CKMB, CKMBINDEX, TROPONINI in the last 168 hours. BNP (last 3 results) No results for input(s): PROBNP in the last 8760 hours. HbA1C: No results for input(s): HGBA1C in the last 72 hours. CBG: No results for input(s): GLUCAP in the last 168 hours. Lipid Profile: No results for input(s): CHOL, HDL, LDLCALC, TRIG, CHOLHDL, LDLDIRECT in the last 72 hours. Thyroid Function Tests: No results for input(s): TSH, T4TOTAL, FREET4, T3FREE, THYROIDAB in the last 72 hours. Anemia Panel: No results for input(s): VITAMINB12, FOLATE, FERRITIN, TIBC, IRON, RETICCTPCT in the last 72 hours. Urine analysis:    Component Value Date/Time   COLORURINE STRAW (A) 05/22/2018 1337   APPEARANCEUR CLEAR 05/22/2018  1337   LABSPEC 1.028 05/22/2018 1337   PHURINE 5.0 05/22/2018 1337   GLUCOSEU NEGATIVE 05/22/2018 1337   GLUCOSEU NEGATIVE 07/06/2016 1037   HGBUR NEGATIVE 05/22/2018 1337   BILIRUBINUR NEGATIVE 05/22/2018 1337   BILIRUBINUR negative 04/02/2016 1459   KETONESUR NEGATIVE 05/22/2018 1337   PROTEINUR NEGATIVE 05/22/2018 1337   UROBILINOGEN 0.2 07/06/2016 1037   NITRITE NEGATIVE 05/22/2018 1337   LEUKOCYTESUR NEGATIVE 05/22/2018 1337   Sepsis Labs: Invalid input(s): PROCALCITONIN, LACTICIDVEN  Recent Results (from the past 240 hour(s))  Culture, blood (Routine x 2)     Status: None (Preliminary result)   Collection Time: 05/22/18 12:25 PM  Result Value Ref Range Status   Specimen Description   Final    BLOOD RIGHT HAND Performed at  [facility], [place] [address], [place], [place] [address]    Special Requests   Final    BOTTLES DRAWN AEROBIC ONLY Blood Culture adequate volume Performed at [facility] [address] [place], [place] [address]    Culture   Final    NO GROWTH 1 DAY Performed at [facility], [place] [address] [place], [place] [address]    Report Status PENDING  Incomplete  Culture, blood (Routine x 2)     Status: None (Preliminary result)   Collection Time: 05/22/18 12:30 PM  Result Value Ref Range Status   Specimen Description   Final    BLOOD RIGHT FOREARM Performed at [facility] [address] [place], [place] [address]    Special Requests   Final    BOTTLES DRAWN AEROBIC AND ANAEROBIC Blood Culture results may not be optimal due to an excessive volume of blood received in culture bottles Performed at [facility] [address] [place], [place] [address]    Culture   Final    NO GROWTH 1 DAY Performed at [facility], [place] [address], [place], [place] [address]    Report Status PENDING  Incomplete  Urine culture     Status: Abnormal   Collection Time: 05/22/18   1:38 PM  Result Value Ref Range Status   Specimen Description   Final    URINE, RANDOM Performed at [facility] [address] [place], [place] [address]    Special Requests   Final    NONE Performed at [facility], [place] [address] [place], [place] [address]    Culture MULTIPLE SPECIES PRESENT, SUGGEST RECOLLECTION (A)  Final   Report Status 05/24/2018 FINAL  Final  MRSA PCR Screening     Status: None   Collection Time: 05/22/18  5:22 PM  Result Value Ref Range Status   MRSA by PCR NEGATIVE NEGATIVE Final    Comment:        The GeneXpert MRSA Assay (FDA approved for NASAL specimens only), is one component of a comprehensive MRSA colonization surveillance program. It is not intended to diagnose MRSA infection nor to guide or monitor treatment for MRSA infections. Performed at [facility], [place] [address] [place], [place] [address]       Radiology Studies: Ct Maxillofacial W Contrast  Result Date: 05/23/2018 CLINICAL DATA:  85 y/o  F; sore left upper tooth. EXAM: CT MAXILLOFACIAL WITH CONTRAST TECHNIQUE: Multidetector CT imaging of the maxillofacial structures was performed with intravenous contrast. Multiplanar CT image reconstructions were also generated. CONTRAST:  68mL ISOVUE-300 IOPAMIDOL (ISOVUE-300) INJECTION 61% COMPARISON:  None. FINDINGS: Osseous: No fracture or mandibular dislocation. No destructive process. Orbits: Negative. No traumatic or inflammatory finding. Bilateral intra-ocular lens replacement Sinuses: Mild mucosal thickening of left maxillary sinus. Additional visible paranasal sinuses and the mastoid air cells are normally aerated. Soft tissues: Negative. Limited intracranial: No significant or unexpected finding. IMPRESSION: No soft tissue odontogenic abscess identified. Negative maxillofacial CT. Electronically Signed   By: Kristine Garbe M.D.   On: 05/23/2018 14:20   Dg Chest Portable 1  View  Result Date: 05/22/2018 CLINICAL DATA:  Chest and back pain, shortness of breath, septic shock versus dissection EXAM: PORTABLE CHEST 1 VIEW COMPARISON:  Portable exam 1324 hours compared to 01/09/2017 FINDINGS: Rotated severely to the LEFT. Cardiac silhouette appears enlarged. Perihilar infiltrates question pulmonary edema. LEFT lower lobe inadequately evaluated No gross  pneumothorax. Bones demineralized. IMPRESSION: Question pulmonary edema. Inadequate evaluation of LEFT lower lobe. Electronically Signed   By: Lavonia Dana M.D.   On: 05/22/2018 13:39   Ct Angio Chest/abd/pel For Dissection W And/or Wo Contrast  Result Date: 05/22/2018 CLINICAL DATA:  Fever and emesis. EXAM: CT ANGIOGRAPHY CHEST, ABDOMEN AND PELVIS TECHNIQUE: Multidetector CT imaging through the chest, abdomen and pelvis was performed using the standard protocol during bolus administration of intravenous contrast. Multiplanar reconstructed images and MIPs were obtained and reviewed to evaluate the vascular anatomy. CONTRAST:  115mL OMNIPAQUE IOHEXOL 350 MG/ML SOLN COMPARISON:  CTA chest dated September 06, 2017. FINDINGS: CTA CHEST FINDINGS Cardiovascular: Normal heart size. No pericardial effusion. Unchanged 4.1 cm ascending thoracic aortic aneurysm. No thoracic aortic dissection or intramural hematoma. No central pulmonary embolism. Mediastinum/Nodes: No enlarged mediastinal, hilar, or axillary lymph nodes. Thyroid gland, trachea, and esophagus demonstrate no significant findings. Lungs/Pleura: New mild interlobular septal thickening in the lung apices. Bilateral lower lobe subsegmental atelectasis. Unchanged scarring in the lingula. No focal consolidation, pleural effusion, or pneumothorax. No suspicious pulmonary nodule. Musculoskeletal: No chest wall abnormality. No acute or significant osseous findings. Old T3, T7, T9, and T12 compression deformities. Prior T12 kyphoplasty. Review of the MIP images confirms the above findings. CTA ABDOMEN  AND PELVIS FINDINGS VASCULAR Aorta: Normal caliber aorta without aneurysm, dissection, vasculitis or significant stenosis. Calcific atherosclerosis. Celiac: Patent without evidence of aneurysm, dissection, vasculitis or significant stenosis. SMA: Patent without evidence of aneurysm, dissection, vasculitis or significant stenosis. Renals: Two left and two right renal arteries are patent without evidence of aneurysm, dissection, vasculitis, fibromuscular dysplasia or significant stenosis. IMA: Patent without evidence of aneurysm, dissection, vasculitis or significant stenosis. Inflow: Patent without evidence of aneurysm, dissection, vasculitis or significant stenosis. Veins: No obvious venous abnormality within the limitations of this arterial phase study. Review of the MIP images confirms the above findings. NON-VASCULAR Hepatobiliary: Diffuse fatty infiltration of the liver. No focal liver abnormality. Status post cholecystectomy. No biliary dilatation. Pancreas: Mild atrophy. No ductal dilatation or surrounding inflammatory changes. Spleen: Normal in size without focal abnormality. Adrenals/Urinary Tract: Adrenal glands are unremarkable. Kidneys are normal, without renal calculi, focal lesion, or hydronephrosis. Bladder is unremarkable. Stomach/Bowel: Stomach is within normal limits. Appendix is normal. No evidence of bowel wall thickening, distention, or inflammatory changes. Mild sigmoid colonic diverticulosis. Lymphatic: No enlarged abdominal or pelvic lymph nodes. Reproductive: Status post hysterectomy. No adnexal masses. Other: Tiny fat containing umbilical hernia. No free fluid or pneumoperitoneum. Musculoskeletal: No acute or significant osseous findings. Chronic L1 compression deformity. Review of the MIP images confirms the above findings. IMPRESSION: Vascular: 1. No evidence of thoracoabdominal dissection, intramural hematoma, or penetrating ulcer. 2. Unchanged 4.1 cm ascending thoracic aortic aneurysm.  Recommend annual imaging followup by CTA or MRA. This recommendation follows 2010 ACCF/AHA/AATS/ACR/ASA/SCA/SCAI/SIR/STS/SVM Guidelines for the Diagnosis and Management of Patients with Thoracic Aortic Disease. Circulation. 2010; 121: O378-H885. Aortic aneurysm NOS (ICD10-I71.9) 3.  Aortic atherosclerosis (ICD10-I70.0). Chest: 1. Mild interstitial pulmonary edema in the lung apices. Abdomen and pelvis: 1.  No acute intra-abdominal process. 2. Hepatic steatosis. Electronically Signed   By: Titus Dubin M.D.   On: 05/22/2018 15:05    Marzetta Board, MD, PhD [facility]  Contact via  [email or web address]  [place] P: [phone]  F: [phone]

## 2018-05-25 ENCOUNTER — Telehealth: Payer: Self-pay | Admitting: Endocrinology

## 2018-05-25 ENCOUNTER — Other Ambulatory Visit: Payer: Self-pay | Admitting: Endocrinology

## 2018-05-25 DIAGNOSIS — D72829 Elevated white blood cell count, unspecified: Secondary | ICD-10-CM

## 2018-05-25 DIAGNOSIS — I10 Essential (primary) hypertension: Secondary | ICD-10-CM

## 2018-05-25 DIAGNOSIS — E063 Autoimmune thyroiditis: Secondary | ICD-10-CM

## 2018-05-25 DIAGNOSIS — R7303 Prediabetes: Secondary | ICD-10-CM

## 2018-05-25 LAB — BASIC METABOLIC PANEL
ANION GAP: 7 (ref 5–15)
BUN: 7 mg/dL — ABNORMAL LOW (ref 8–23)
CO2: 30 mmol/L (ref 22–32)
Calcium: 8.1 mg/dL — ABNORMAL LOW (ref 8.9–10.3)
Chloride: 103 mmol/L (ref 98–111)
Creatinine, Ser: 0.43 mg/dL — ABNORMAL LOW (ref 0.44–1.00)
GFR calc Af Amer: >60 mL/min (ref >60)
Glucose, Bld: 114 mg/dL — ABNORMAL HIGH (ref 70–99)
Potassium: 3.4 mmol/L — ABNORMAL LOW (ref 3.5–5.1)
Sodium: 140 mmol/L (ref 135–145)

## 2018-05-25 LAB — MAGNESIUM: Magnesium: 1.4 mg/dL — ABNORMAL LOW (ref 1.7–2.4)

## 2018-05-25 MED ORDER — POTASSIUM CHLORIDE ER 10 MEQ PO TBCR: 20.0000 meq | EXTENDED_RELEASE_TABLET | Freq: Every day | ORAL | 0 refills | Status: DC

## 2018-05-25 MED ORDER — CLINDAMYCIN HCL 300 MG PO CAPS
300.0000 mg | ORAL_CAPSULE | Freq: Four times a day (QID) | ORAL | Status: DC
  Administered 2018-05-25: 300 mg via ORAL
  Filled 2018-05-25 (×2): qty 1

## 2018-05-25 MED ORDER — MAGNESIUM GLUCONATE 30 MG PO TABS: 30.0000 mg | ORAL_TABLET | Freq: Every day | ORAL | 0 refills | Status: DC

## 2018-05-25 MED ORDER — AMOXICILLIN-POT CLAVULANATE 875-125 MG PO TABS: 1.0000 | ORAL_TABLET | Freq: Two times a day (BID) | ORAL | 0 refills | Status: AC

## 2018-05-25 NOTE — Discharge Instructions (Signed)
Follow with [name], MD in 5-7 days  Please get a complete blood count and chemistry panel checked by your Primary MD at your next visit, and again as instructed by your Primary MD. Please get your medications reviewed and adjusted by your Primary MD.  Please request your Primary MD to go over all Hospital Tests and Procedure/Radiological results at the follow up, please get all Hospital records sent to your Prim MD by signing hospital release before you go home.  In some cases, there will be blood work, cultures and biopsy results pending at the time of your discharge. Please request that your primary care M.D. goes through all the records of your hospital data and follows up on these results.  If you had Pneumonia of Lung problems at the Hospital: Please get a 2 view Chest X ray done in 6-8 weeks after hospital discharge or sooner if instructed by your Primary MD.  If you have Congestive Heart Failure: Please call your Cardiologist or Primary MD anytime you have any of the following symptoms:  1) 3 pound weight gain in 24 hours or 5 pounds in 1 week  2) shortness of breath, with or without a dry hacking cough  3) swelling in the hands, feet or stomach  4) if you have to sleep on extra pillows at night in order to breathe  Follow cardiac low salt diet and 1.5 lit/day fluid restriction.  If you have diabetes Accuchecks 4 times/day, Once in AM empty stomach and then before each meal. Log in all results and show them to your primary doctor at your next visit. If any glucose reading is under 80 or above 300 call your primary MD immediately.  If you have Seizure/Convulsions/Epilepsy: Please do not drive, operate heavy machinery, participate in activities at heights or participate in high speed sports until you have seen by Primary MD or a Neurologist and advised to do so again.  If you had Gastrointestinal Bleeding: Please ask your Primary MD to check a complete blood count within one  week of discharge or at your next visit. Your endoscopic/colonoscopic biopsies that are pending at the time of discharge, will also need to followed by your Primary MD.  Get Medicines reviewed and adjusted. Please take all your medications with you for your next visit with your Primary MD  Please request your Primary MD to go over all hospital tests and procedure/radiological results at the follow up, please ask your Primary MD to get all Hospital records sent to his/her office.  If you experience worsening of your admission symptoms, develop shortness of breath, life threatening emergency, suicidal or homicidal thoughts you must seek medical attention immediately by calling 911 or calling your MD immediately  if symptoms less severe.  You must read complete instructions/literature along with all the possible adverse reactions/side effects for all the Medicines you take and that have been prescribed to you. Take any new Medicines after you have completely understood and accpet all the possible adverse reactions/side effects.   Do not drive or operate heavy machinery when taking Pain medications.   Do not take more than prescribed Pain, Sleep and Anxiety Medications  Special Instructions: If you have smoked or chewed Tobacco  in the last 2 yrs please stop smoking, stop any regular Alcohol  and or any Recreational drug use.  Wear Seat belts while driving.  Please note You were cared for by a hospitalist during your hospital stay. If you have any questions about your discharge  medications or the care you received while you were in the hospital after you are discharged, you can call the unit and asked to speak with the hospitalist on call if the hospitalist that took care of you is not available. Once you are discharged, your primary care physician will handle any further medical issues. Please note that NO REFILLS for any discharge medications will be authorized once you are discharged, as it is  imperative that you return to your primary care physician (or establish a relationship with a primary care physician if you do not have one) for your aftercare needs so that they can reassess your need for medications and monitor your lab values.  You can reach the hospitalist office at phone [phone] or fax [phone]   If you do not have a primary care physician, you can call [phone] for a physician referral.  Activity: As tolerated with Full fall precautions use walker/cane & assistance as needed    Diet: low sodium  Disposition Home

## 2018-05-25 NOTE — Telephone Encounter (Signed)
Please remind patient that she needs to come in for her labs and office visit as scheduled post hospital discharge.  Recommend not taking any Lasix until seen because of dehydration she had

## 2018-05-25 NOTE — Discharge Summary (Signed)
Physician Discharge Summary  Angela [name] ZOX:[id number] DOB: 01/09/1933 DOA: 05/22/2018  PCP: [name], MD  Admit date: 05/22/2018 Discharge date: 05/25/2018  Admitted From: Home Disposition: Home  Recommendations for Outpatient Follow-up:  1. Follow up with PCP in 1-2 weeks 2. Please obtain BMP/CBC in one week 3. Follow up with your dentist in 3 to 5 days  Home Health: None Equipment/Devices: None  Discharge Condition: Stable CODE STATUS: Full code Diet recommendation: Low-sodium  HPI: Per admitting MD, 85 year old lady with multiple comorbidities who came into the hospital with altered mentation, profoundly hypotensive on initial evaluation Was well until going to bed yesterday Was out in about the day before with no significant limitations History of breast cancer, history of hypertension, history of diabetes, hyperlipidemia left breast cancer Pulmonary embolism in 2017 She felt well at the time of going to bed yesterday woke up in the middle of the night not able to control her bladder functions, fever 101 Found to be very hypotensive with systolic in the 81X when she came into the emergency department Fluid resuscitated she is responding well to fluids Mental status back to baseline  Hospital Course: Principal Problem Concern for septic shock -Not sure if this is truly septic shock versus combination of narcotics as well as blood pressure regimen. Urinalysis was negative, CT of the chest abdomen pelvis were negative as well. Concern for possible tooth abscess but CT maxillofacial on 3/3 without soft tissue odontogenic abscesses identified and essentially negative.  She does have significant amount of pain on the overlying gum,?  Very small infectious site not picked up by the CT scan.  She has an appointment as an outpatient with her dentist.  She was maintained on broad-spectrum antibiotics, her cultures have remained negative, will be narrowed down to Augmentin to have  dental coverage and empirically completed a course as an outpatient.  She is stable for discharge.  Active Problems Hypertension -resume home medications Hypothyroidism -Continue Synthroid History of PE -Continue Eliquis History of anxiety -Continue home Ativan Prediabetes -Most recent A1c 6.0 Hypokalemia/hypomagnesemia-due to home furosemide, will prescribe short course of supplementation and recommend repeat blood work as an outpatient within a week   Discharge Diagnoses:  Active Problems:   Sepsis [facility])     Discharge Instructions   Allergies as of 05/25/2018      Reactions   Crestor [rosuvastatin Calcium] Anaphylaxis, Other (See Comments)   Abdominal discomfort   Gadolinium Nausea And Vomiting    pt states she had nausea after receiving MAGNEVIST for breast imaging   Pravastatin Other (See Comments)   Locked jaw, weakness   Simvastatin Other (See Comments)   Weakness and locked jaw.   Penicillins Swelling, Rash   Patient tolerates amoxicillin Has patient had a PCN reaction causing immediate rash, facial/tongue/throat swelling, SOB or lightheadedness with hypotension: yes Has patient had a PCN reaction causing severe rash involving mucus membranes or skin necrosis: unknown Has patient had a PCN reaction that required hospitalization: unknown Has patient had a PCN reaction occurring within the last 10 years: no If all of the above answers are "NO", then may proceed with Cephalosporin use.      Medication List    STOP taking these medications   amoxicillin 500 MG capsule Commonly known as:  AMOXIL     TAKE these medications   amoxicillin-clavulanate 875-125 MG tablet Commonly known as:  AUGMENTIN Take 1 tablet by mouth every 12 (twelve) hours for 7 days.   benazepril-hydrochlorthiazide 20-12.5  MG tablet Commonly known as:  LOTENSIN HCT TAKE 1 TABLET ONCE DAILY.   ELIQUIS 5 MG Tabs tablet Generic drug:  apixaban TAKE 1 TABLET BY MOUTH TWICE DAILY. What changed:   how much to take   furosemide 20 MG tablet Commonly known as:  LASIX 1 TABLET ONCE DAILY FOR EDEMA (IF WEIGHT HIGH BY 2 POUNDS IN 24 HRS, WEIGH DAILY.) What changed:  See the new instructions.   ICAPS AREDS 2 PO Take 1 tablet by mouth daily.   isosorbide mononitrate 30 MG 24 hr tablet Commonly known as:  IMDUR TAKE 1 TABLET ONCE DAILY.   LORazepam 0.5 MG tablet Commonly known as:  ATIVAN TAKE (1) TABLET TWICE DAILY AS NEEDED FOR ANXIETY. What changed:  See the new instructions.   magnesium gluconate 30 MG tablet Commonly known as:  MAGONATE Take 1 tablet (30 mg total) by mouth daily after supper.   pantoprazole 40 MG tablet Commonly known as:  PROTONIX TAKE 1 TABLET ONCE DAILY.   PERCOCET 10-325 MG tablet Generic drug:  oxyCODONE-acetaminophen Take 0.25-1 tablets by mouth every 6 (six) hours as needed for pain.   polyethylene glycol packet Commonly known as:  MIRALAX / GLYCOLAX Take 17 g by mouth 3 (three) times daily as needed. What changed:  reasons to take this   potassium chloride 10 MEQ tablet Commonly known as:  K-DUR Take 2 tablets (20 mEq total) by mouth daily for 7 days.   PRESCRIPTION MEDICATION Inject 1 application as directed every 30 (thirty) days. Cortisone injections, each knee   SYNTHROID 125 MCG tablet Generic drug:  levothyroxine TAKE 1 TABLET IN THE MORNING WITH BREAKFAST. What changed:  See the new instructions.   VENTOLIN HFA 108 (90 Base) MCG/ACT inhaler Generic drug:  albuterol USE 2 PUFFS 4 TIMES A DAY AS NEEDED FOR WHEEZE OR SHORTNESS OF BREATH. What changed:  See the new instructions.   Vitamin D (Ergocalciferol) 1.25 MG (50000 UT) Caps capsule Commonly known as:  DRISDOL TAKE ONE CAPSULE ONCE A WEEK. What changed:  See the new instructions.   VOLTAREN 1 % Gel Generic drug:  diclofenac sodium Apply 2 g topically 4 (four) times daily as needed (pain). To knees      Follow-up Information    [name], MD. Schedule an  appointment as soon as possible for a visit in 1 week(s).   Specialty:  Endocrinology Why:  for repeat labwork, check magnesium and potassium levels Contact information: [facility] [place]  [address] [phone]           Consultations:  Critical care  Procedures/Studies:  Ct Maxillofacial W Contrast  Result Date: 05/23/2018 CLINICAL DATA:  85 y/o  F; sore left upper tooth. EXAM: CT MAXILLOFACIAL WITH CONTRAST TECHNIQUE: Multidetector CT imaging of the maxillofacial structures was performed with intravenous contrast. Multiplanar CT image reconstructions were also generated. CONTRAST:  52mL ISOVUE-300 IOPAMIDOL (ISOVUE-300) INJECTION 61% COMPARISON:  None. FINDINGS: Osseous: No fracture or mandibular dislocation. No destructive process. Orbits: Negative. No traumatic or inflammatory finding. Bilateral intra-ocular lens replacement Sinuses: Mild mucosal thickening of left maxillary sinus. Additional visible paranasal sinuses and the mastoid air cells are normally aerated. Soft tissues: Negative. Limited intracranial: No significant or unexpected finding. IMPRESSION: No soft tissue odontogenic abscess identified. Negative maxillofacial CT. Electronically Signed   By: Kristine Garbe M.D.   On: 05/23/2018 14:20   Dg Chest Portable 1 View  Result Date: 05/22/2018 CLINICAL DATA:  Chest and back pain, shortness of breath,  septic shock versus dissection EXAM: PORTABLE CHEST 1 VIEW COMPARISON:  Portable exam 1324 hours compared to 01/09/2017 FINDINGS: Rotated severely to the LEFT. Cardiac silhouette appears enlarged. Perihilar infiltrates question pulmonary edema. LEFT lower lobe inadequately evaluated No gross pneumothorax. Bones demineralized. IMPRESSION: Question pulmonary edema. Inadequate evaluation of LEFT lower lobe. Electronically Signed   By: Lavonia Dana M.D.   On: 05/22/2018 13:39   Ct Angio Chest/abd/pel For Dissection W And/or Wo Contrast  Result Date:  05/22/2018 CLINICAL DATA:  Fever and emesis. EXAM: CT ANGIOGRAPHY CHEST, ABDOMEN AND PELVIS TECHNIQUE: Multidetector CT imaging through the chest, abdomen and pelvis was performed using the standard protocol during bolus administration of intravenous contrast. Multiplanar reconstructed images and MIPs were obtained and reviewed to evaluate the vascular anatomy. CONTRAST:  145mL OMNIPAQUE IOHEXOL 350 MG/ML SOLN COMPARISON:  CTA chest dated September 06, 2017. FINDINGS: CTA CHEST FINDINGS Cardiovascular: Normal heart size. No pericardial effusion. Unchanged 4.1 cm ascending thoracic aortic aneurysm. No thoracic aortic dissection or intramural hematoma. No central pulmonary embolism. Mediastinum/Nodes: No enlarged mediastinal, hilar, or axillary lymph nodes. Thyroid gland, trachea, and esophagus demonstrate no significant findings. Lungs/Pleura: New mild interlobular septal thickening in the lung apices. Bilateral lower lobe subsegmental atelectasis. Unchanged scarring in the lingula. No focal consolidation, pleural effusion, or pneumothorax. No suspicious pulmonary nodule. Musculoskeletal: No chest wall abnormality. No acute or significant osseous findings. Old T3, T7, T9, and T12 compression deformities. Prior T12 kyphoplasty. Review of the MIP images confirms the above findings. CTA ABDOMEN AND PELVIS FINDINGS VASCULAR Aorta: Normal caliber aorta without aneurysm, dissection, vasculitis or significant stenosis. Calcific atherosclerosis. Celiac: Patent without evidence of aneurysm, dissection, vasculitis or significant stenosis. SMA: Patent without evidence of aneurysm, dissection, vasculitis or significant stenosis. Renals: Two left and two right renal arteries are patent without evidence of aneurysm, dissection, vasculitis, fibromuscular dysplasia or significant stenosis. IMA: Patent without evidence of aneurysm, dissection, vasculitis or significant stenosis. Inflow: Patent without evidence of aneurysm, dissection,  vasculitis or significant stenosis. Veins: No obvious venous abnormality within the limitations of this arterial phase study. Review of the MIP images confirms the above findings. NON-VASCULAR Hepatobiliary: Diffuse fatty infiltration of the liver. No focal liver abnormality. Status post cholecystectomy. No biliary dilatation. Pancreas: Mild atrophy. No ductal dilatation or surrounding inflammatory changes. Spleen: Normal in size without focal abnormality. Adrenals/Urinary Tract: Adrenal glands are unremarkable. Kidneys are normal, without renal calculi, focal lesion, or hydronephrosis. Bladder is unremarkable. Stomach/Bowel: Stomach is within normal limits. Appendix is normal. No evidence of bowel wall thickening, distention, or inflammatory changes. Mild sigmoid colonic diverticulosis. Lymphatic: No enlarged abdominal or pelvic lymph nodes. Reproductive: Status post hysterectomy. No adnexal masses. Other: Tiny fat containing umbilical hernia. No free fluid or pneumoperitoneum. Musculoskeletal: No acute or significant osseous findings. Chronic L1 compression deformity. Review of the MIP images confirms the above findings. IMPRESSION: Vascular: 1. No evidence of thoracoabdominal dissection, intramural hematoma, or penetrating ulcer. 2. Unchanged 4.1 cm ascending thoracic aortic aneurysm. Recommend annual imaging followup by CTA or MRA. This recommendation follows 2010 ACCF/AHA/AATS/ACR/ASA/SCA/SCAI/SIR/STS/SVM Guidelines for the Diagnosis and Management of Patients with Thoracic Aortic Disease. Circulation. 2010; 121: H829-H371. Aortic aneurysm NOS (ICD10-I71.9) 3.  Aortic atherosclerosis (ICD10-I70.0). Chest: 1. Mild interstitial pulmonary edema in the lung apices. Abdomen and pelvis: 1.  No acute intra-abdominal process. 2. Hepatic steatosis. Electronically Signed   By: Titus Dubin M.D.   On: 05/22/2018 15:05      Subjective: - no chest pain, shortness of breath, no abdominal pain, nausea or  vomiting.    Discharge Exam: BP (!) 164/97 (BP Location: Right Arm)   Pulse 74   Temp 98.9 F (37.2 C) (Oral)   Resp 20   Ht 5\' 8"  (1.727 m)   Wt 122.8 kg   SpO2 97%   BMI 41.16 kg/m   General: Pt is alert, awake, not in acute distress Cardiovascular: RRR, S1/S2 +, no rubs, no gallops Respiratory: CTA bilaterally, no wheezing, no rhonchi Abdominal: Soft, NT, ND, bowel sounds + Extremities: no edema, no cyanosis   The results of significant diagnostics from this hospitalization (including imaging, microbiology, ancillary and laboratory) are listed below for reference.     Microbiology: Recent Results (from the past 240 hour(s))  Culture, blood (Routine x 2)     Status: None (Preliminary result)   Collection Time: 05/22/18 12:25 PM  Result Value Ref Range Status   Specimen Description   Final    BLOOD RIGHT HAND Performed at [facility] [address], [place], [place] [address]    Special Requests   Final    BOTTLES DRAWN AEROBIC ONLY Blood Culture adequate volume Performed at [facility] [address], [place], [place] [address]    Culture   Final    NO GROWTH 3 DAYS Performed at [facility], [place] [address], [place], [place] [address]    Report Status PENDING  Incomplete  Culture, blood (Routine x 2)     Status: None (Preliminary result)   Collection Time: 05/22/18 12:30 PM  Result Value Ref Range Status   Specimen Description   Final    BLOOD RIGHT FOREARM Performed at [facility] [address], [place], [place] [address]    Special Requests   Final    BOTTLES DRAWN AEROBIC AND ANAEROBIC Blood Culture results may not be optimal due to an excessive volume of blood received in culture bottles Performed at [facility] [address], [place], [place] [address]    Culture   Final    NO GROWTH 3 DAYS Performed at [facility], [place] [address] [place], [place] [address]     Report Status PENDING  Incomplete  Urine culture     Status: Abnormal   Collection Time: 05/22/18  1:38 PM  Result Value Ref Range Status   Specimen Description   Final    URINE, RANDOM Performed at [facility] [address] [place], [place] [address]    Special Requests   Final    NONE Performed at [facility], [place] [address], [place], [place] [address]    Culture MULTIPLE SPECIES PRESENT, SUGGEST RECOLLECTION (A)  Final   Report Status 05/24/2018 FINAL  Final  MRSA PCR Screening     Status: None   Collection Time: 05/22/18  5:22 PM  Result Value Ref Range Status   MRSA by PCR NEGATIVE NEGATIVE Final    Comment:        The GeneXpert MRSA Assay (FDA approved for NASAL specimens only), is one component of a comprehensive MRSA colonization surveillance program. It is not intended to diagnose MRSA infection nor to guide or monitor treatment for MRSA infections. Performed at [facility], [place] [address] [place], [place] [address]      Labs: BNP (last 3 results) No results for input(s): BNP in the last 8760 hours. Basic Metabolic Panel: Recent Labs  Lab 05/22/18 1240 05/22/18 1257 05/22/18 1516 05/23/18 0825 05/24/18 1761  05/25/18 0312  NA 139  --   --  138 139 140  K <2.0*  --   --  3.8 3.6 3.4*  CL 111  --   --  107 106 103  CO2 22  --   --  27 26 30   GLUCOSE 130*  --   --  123* 124* 114*  BUN 15  --   --  10 6* 7*  CREATININE 0.57 0.40*  --  0.51 0.51 0.43*  CALCIUM 5.6*  --   --  7.8* 7.9* 8.1*  MG  --   --  1.1* 1.6* 1.5* 1.4*  PHOS  --   --   --  2.6  --   --    Liver Function Tests: Recent Labs  Lab 05/22/18 1240 05/24/18 0306  AST 57* 29  ALT 41 43  ALKPHOS 85 86  BILITOT 1.4* 0.7  PROT 4.2* 4.9*  ALBUMIN 2.3* 2.5*   Recent Labs  Lab 05/22/18 1516  LIPASE 26   No results for input(s): AMMONIA in the last 168 hours. CBC: Recent Labs  Lab 05/22/18 1240 05/22/18 1738  05/23/18 0825 05/24/18 0306  WBC 22.2* 22.0* 9.7 11.4*  NEUTROABS 18.7*  --   --   --   HGB 9.8* 12.2 11.8* 11.0*  HCT 31.1* 37.5 37.2 34.3*  MCV 94.8 95.7 96.4 95.0  PLT 151 183 143* 150   Cardiac Enzymes: No results for input(s): CKTOTAL, CKMB, CKMBINDEX, TROPONINI in the last 168 hours. BNP: Invalid input(s): POCBNP CBG: No results for input(s): GLUCAP in the last 168 hours. D-Dimer No results for input(s): DDIMER in the last 72 hours. Hgb A1c No results for input(s): HGBA1C in the last 72 hours. Lipid Profile No results for input(s): CHOL, HDL, LDLCALC, TRIG, CHOLHDL, LDLDIRECT in the last 72 hours. Thyroid function studies No results for input(s): TSH, T4TOTAL, T3FREE, THYROIDAB in the last 72 hours.  Invalid input(s): FREET3 Anemia work up No results for input(s): VITAMINB12, FOLATE, FERRITIN, TIBC, IRON, RETICCTPCT in the last 72 hours. Urinalysis    Component Value Date/Time   COLORURINE STRAW (A) 05/22/2018 1337   APPEARANCEUR CLEAR 05/22/2018 1337   LABSPEC 1.028 05/22/2018 1337   PHURINE 5.0 05/22/2018 1337   GLUCOSEU NEGATIVE 05/22/2018 1337   GLUCOSEU NEGATIVE 07/06/2016 1037   HGBUR NEGATIVE 05/22/2018 1337   BILIRUBINUR NEGATIVE 05/22/2018 1337   BILIRUBINUR negative 04/02/2016 1459   KETONESUR NEGATIVE 05/22/2018 1337   PROTEINUR NEGATIVE 05/22/2018 1337   UROBILINOGEN 0.2 07/06/2016 1037   NITRITE NEGATIVE 05/22/2018 1337   LEUKOCYTESUR NEGATIVE 05/22/2018 1337   Sepsis Labs Invalid input(s): PROCALCITONIN,  WBC,  LACTICIDVEN  FURTHER DISCHARGE INSTRUCTIONS:   Get Medicines reviewed and adjusted: Please take all your medications with you for your next visit with your Primary MD   Laboratory/radiological data: Please request your Primary MD to go over all hospital tests and procedure/radiological results at the follow up, please ask your Primary MD to get all Hospital records sent to his/her office.   In some cases, they will be blood work,  cultures and biopsy results pending at the time of your discharge. Please request that your primary care M.D. goes through all the records of your hospital data and follows up on these results.   Also Note the following: If you experience worsening of your admission symptoms, develop shortness of breath, life threatening emergency, suicidal or homicidal thoughts you must seek medical attention immediately by calling 911 or calling your MD immediately  if symptoms less severe.   You must read complete instructions/literature along with all the possible adverse reactions/side effects for all the Medicines you take and that have been prescribed to you. Take any new Medicines after you have completely understood and accpet all the possible adverse reactions/side effects.    Do not drive when taking Pain medications or sleeping medications (Benzodaizepines)   Do not take more than prescribed Pain, Sleep and Anxiety Medications. It is not advisable to combine anxiety,sleep and pain medications without talking with your primary care practitioner   Special Instructions: If you have smoked or chewed Tobacco  in the last 2 yrs please stop smoking, stop any regular Alcohol  and or any Recreational drug use.   Wear Seat belts while driving.   Please note: You were cared for by a hospitalist during your hospital stay. Once you are discharged, your primary care physician will handle any further medical issues. Please note that NO REFILLS for any discharge medications will be authorized once you are discharged, as it is imperative that you return to your primary care physician (or establish a relationship with a primary care physician if you do not have one) for your post hospital discharge needs so that they can reassess your need for medications and monitor your lab values.  Time coordinating discharge: 35 minutes  SIGNED:  Marzetta Board, PA-S 05/25/2018, 1:50 PM

## 2018-05-25 NOTE — Care Management Important Message (Signed)
Important Message  Patient Details  Name: Angela [name] MRN: [id number] Date of Birth: 07-18-1932   Medicare Important Message Given:  Yes    Carlean Crowl 05/25/2018, 9:13 AM

## 2018-05-26 LAB — BPAM RBC
Blood Product Expiration Date: 202003222359
Blood Product Expiration Date: 202003222359
Unit Type and Rh: 6200
Unit Type and Rh: 6200

## 2018-05-26 LAB — TYPE AND SCREEN
ABO/RH(D): A POS
Antibody Screen: NEGATIVE
Unit division: 0
Unit division: 0

## 2018-05-26 NOTE — Telephone Encounter (Signed)
Called pt and informed her of MD wishes to keep her appointment. Pt verbalized understanding and stated that she would be here as scheduled. Pt also stated that she is very swollen because of fluid retention and at hospital discharge, the MD ordered her to take the Lasix. At this time, pt cannot get a pair of shoes on.  Do you still want her to stop taking?

## 2018-05-26 NOTE — Telephone Encounter (Signed)
Okay to continue Lasix, may take up to 40 mg daily till swelling is better

## 2018-05-26 NOTE — Telephone Encounter (Signed)
Called pt and informed her MD message. Pt stated that she will not take more than 40mg  PO QD. Most of the time, she only takes 20mg  daily as needed.

## 2018-05-27 LAB — CULTURE, BLOOD (ROUTINE X 2)
Culture: NO GROWTH
Culture: NO GROWTH
Special Requests: ADEQUATE

## 2018-05-30 DIAGNOSIS — M170 Bilateral primary osteoarthritis of knee: Secondary | ICD-10-CM | POA: Diagnosis not present

## 2018-06-01 ENCOUNTER — Other Ambulatory Visit: Payer: Medicare Other

## 2018-06-06 ENCOUNTER — Other Ambulatory Visit: Payer: Self-pay

## 2018-06-06 ENCOUNTER — Encounter: Payer: Self-pay | Admitting: Endocrinology

## 2018-06-06 ENCOUNTER — Ambulatory Visit (INDEPENDENT_AMBULATORY_CARE_PROVIDER_SITE_OTHER): Payer: Medicare Other | Admitting: Endocrinology

## 2018-06-06 ENCOUNTER — Telehealth: Payer: Self-pay

## 2018-06-06 VITALS — BP 126/84 | HR 104 | Temp 98.0°F | Resp 12

## 2018-06-06 DIAGNOSIS — R7303 Prediabetes: Secondary | ICD-10-CM | POA: Diagnosis not present

## 2018-06-06 DIAGNOSIS — D72829 Elevated white blood cell count, unspecified: Secondary | ICD-10-CM

## 2018-06-06 DIAGNOSIS — E063 Autoimmune thyroiditis: Secondary | ICD-10-CM

## 2018-06-06 DIAGNOSIS — I10 Essential (primary) hypertension: Secondary | ICD-10-CM | POA: Diagnosis not present

## 2018-06-06 LAB — COMPREHENSIVE METABOLIC PANEL WITH GFR
ALT: 21 U/L (ref 0–35)
AST: 17 U/L (ref 0–37)
Albumin: 4 g/dL (ref 3.5–5.2)
Alkaline Phosphatase: 130 U/L — ABNORMAL HIGH (ref 39–117)
BUN: 18 mg/dL (ref 6–23)
CO2: 28 meq/L (ref 19–32)
Calcium: 9.6 mg/dL (ref 8.4–10.5)
Chloride: 97 meq/L (ref 96–112)
Creatinine, Ser: 0.65 mg/dL (ref 0.40–1.20)
GFR: 86.84 mL/min
Glucose, Bld: 142 mg/dL — ABNORMAL HIGH (ref 70–99)
Potassium: 4 meq/L (ref 3.5–5.1)
Sodium: 137 meq/L (ref 135–145)
Total Bilirubin: 0.9 mg/dL (ref 0.2–1.2)
Total Protein: 7.2 g/dL (ref 6.0–8.3)

## 2018-06-06 LAB — CBC WITH DIFFERENTIAL/PLATELET
Basophils Absolute: 0.2 K/uL — ABNORMAL HIGH (ref 0.0–0.1)
Basophils Relative: 1.2 % (ref 0.0–3.0)
Eosinophils Absolute: 0.4 K/uL (ref 0.0–0.7)
Eosinophils Relative: 1.8 % (ref 0.0–5.0)
HCT: 47.3 % — ABNORMAL HIGH (ref 36.0–46.0)
Hemoglobin: 15.9 g/dL — ABNORMAL HIGH (ref 12.0–15.0)
Lymphocytes Relative: 16.2 % (ref 12.0–46.0)
Lymphs Abs: 3.2 K/uL (ref 0.7–4.0)
MCHC: 33.7 g/dL (ref 30.0–36.0)
MCV: 90.2 fl (ref 78.0–100.0)
Monocytes Absolute: 1.5 K/uL — ABNORMAL HIGH (ref 0.1–1.0)
Monocytes Relative: 7.5 % (ref 3.0–12.0)
Neutro Abs: 14.7 K/uL — ABNORMAL HIGH (ref 1.4–7.7)
Neutrophils Relative %: 73.3 % (ref 43.0–77.0)
Platelets: 357 K/uL (ref 150.0–400.0)
RBC: 5.25 Mil/uL — ABNORMAL HIGH (ref 3.87–5.11)
RDW: 13.9 % (ref 11.5–15.5)
WBC: 20 K/uL (ref 4.0–10.5)

## 2018-06-06 LAB — TSH: TSH: 1.65 u[IU]/mL (ref 0.35–4.50)

## 2018-06-06 LAB — MAGNESIUM: Magnesium: 1.6 mg/dL (ref 1.5–2.5)

## 2018-06-06 LAB — T4, FREE: Free T4: 1.29 ng/dL (ref 0.60–1.60)

## 2018-06-06 LAB — HEMOGLOBIN A1C: Hgb A1c MFr Bld: 5.9 % (ref 4.6–6.5)

## 2018-06-06 NOTE — Progress Notes (Addendum)
Subjective:     Patient ID: Angela [name], female   DOB: [birth year], 85 y.o.   MRN: [id number]  HPI  Reason for visit: Transitional care management post discharge  Recent hospitalization admission dates: Discharge on 05/25/2018  Patient's history, hospital course, labs and other studies done, discharge summary and recommendations reviewed in detail No infectious source was found including on CT scans of chest and abdomen and maxillofacial CT Urinalysis was negative, culture nonconclusive Patient's medication list was updated and reviewed Telephone contact made with patient on 05/25/2018 to initiate post hospital care and follow-up  Recent problems  EDEMA: She says that at the time of discharge she had more edema after getting fluids during hospitalization She was taking 20 mg of Lasix and was asked to take up to 2 tablets after discharge In the last week or so she has not taken any since edema has improved  Left lower leg bruising: She had a bruise when she was being taken to the hospital by EMS with blunt injury which caused significant bruising and asking about a local lump present  HYPOTENSION secondary to unknown infection: She had possible sepsis but no etiology found and had no UTI.  Responded to fluid resuscitation. Also had hypomagnesemia and she was given repletion therapy for this  Hypertension, history of:  She has had long-standing hypertension which has been well controlled.  Has been on Lotensin HCT 20/12.5 Although blood pressure was low during hospitalization it was high at the time of discharge and she is back on the same dose   BP Readings from Last 3 Encounters:  06/06/18 126/84  05/25/18 (!) 164/97  01/31/18 110/70    PREDIABETES: She has had long-standing mildly impaired fasting glucose  However A1c has been consistently normal and around 6%  In the past had been treated with metformin No recent problems with hypoglycemia Blood sugar may be  relatively higher after she has a steroid injection in her knee    Lab Results  Component Value Date   HGBA1C 6.0 01/31/2018   HGBA1C 6.0 06/06/2017   HGBA1C 5.8 03/08/2017   Lab Results  Component Value Date   MICROALBUR 0.4 12/05/2012   LDLCALC 144 (H) 01/31/2018   CREATININE 0.43 (L) 05/25/2018   OTHER active problems are detailed in review of systems:   Allergies as of 06/06/2018      Reactions   Crestor [rosuvastatin Calcium] Anaphylaxis, Other (See Comments)   Abdominal discomfort   Gadolinium Nausea And Vomiting    pt states she had nausea after receiving MAGNEVIST for breast imaging   Pravastatin Other (See Comments)   Locked jaw, weakness   Simvastatin Other (See Comments)   Weakness and locked jaw.   Penicillins Swelling, Rash   Patient tolerates amoxicillin Has patient had a PCN reaction causing immediate rash, facial/tongue/throat swelling, SOB or lightheadedness with hypotension: yes Has patient had a PCN reaction causing severe rash involving mucus membranes or skin necrosis: unknown Has patient had a PCN reaction that required hospitalization: unknown Has patient had a PCN reaction occurring within the last 10 years: no If all of the above answers are "NO", then may proceed with Cephalosporin use.      Medication List       Accurate as of June 06, 2018 11:03 AM. Always use your most recent med list.        benazepril-hydrochlorthiazide 20-12.5 MG tablet Commonly known as:  LOTENSIN HCT TAKE 1 TABLET ONCE DAILY.   Eliquis  5 MG Tabs tablet Generic drug:  apixaban TAKE 1 TABLET BY MOUTH TWICE DAILY.   furosemide 20 MG tablet Commonly known as:  LASIX 1 TABLET ONCE DAILY FOR EDEMA (IF WEIGHT HIGH BY 2 POUNDS IN 24 HRS, WEIGH DAILY.)   ICAPS AREDS 2 PO Take 1 tablet by mouth daily.   isosorbide mononitrate 30 MG 24 hr tablet Commonly known as:  IMDUR TAKE 1 TABLET ONCE DAILY.   LORazepam 0.5 MG tablet Commonly known as:  ATIVAN TAKE (1)  TABLET TWICE DAILY AS NEEDED FOR ANXIETY.   magnesium gluconate 30 MG tablet Commonly known as:  MAGONATE Take 1 tablet (30 mg total) by mouth daily after supper.   pantoprazole 40 MG tablet Commonly known as:  PROTONIX TAKE 1 TABLET ONCE DAILY.   Percocet 10-325 MG tablet Generic drug:  oxyCODONE-acetaminophen Take 0.25-1 tablets by mouth every 6 (six) hours as needed for pain.   polyethylene glycol packet Commonly known as:  MIRALAX / GLYCOLAX Take 17 g by mouth 3 (three) times daily as needed.   potassium chloride 10 MEQ tablet Commonly known as:  K-DUR Take 2 tablets (20 mEq total) by mouth daily for 7 days.   PRESCRIPTION MEDICATION Inject 1 application as directed every 30 (thirty) days. Cortisone injections, each knee   Synthroid 125 MCG tablet Generic drug:  levothyroxine TAKE 1 TABLET IN THE MORNING WITH BREAKFAST.   Ventolin HFA 108 (90 Base) MCG/ACT inhaler Generic drug:  albuterol USE 2 PUFFS 4 TIMES A DAY AS NEEDED FOR WHEEZE OR SHORTNESS OF BREATH.   Vitamin D (Ergocalciferol) 1.25 MG (50000 UT) Caps capsule Commonly known as:  DRISDOL TAKE ONE CAPSULE ONCE A WEEK.   Voltaren 1 % Gel Generic drug:  diclofenac sodium Apply 2 g topically 4 (four) times daily as needed (pain). To knees       Review of Systems   OSTEOPOROSIS: The following is a copy of the previous note  She had developed a vertebral fracture late 2018; she thinks she also developed rib fractures in the mid 2018 X-ray reports as follows: Vertebral augmentation at T12. A mild T7 compression deformity is new since June. A mild to moderate T9 compression deformity is not significantly changed. Bone density report still not available from orthopedic surgeon on record   Previously had a course of calcitonin that was started in 01/2017 with relief of her pain  She has not agreed to do Prolia injection despite repeated explanations and medication being approved by her insurance   Primary  hypothyroidism: She has had long-standing primary hypothyroidism.   Has been taking 125 mcg levothyroxine since 06/2017, previously on a smaller dose  She has had no change in doses made and is improving subjectively since her hospital discharge  She takes her supplement consistently before breakfast  TSH levels:   Lab Results  Component Value Date   TSH 1.02 01/31/2018   TSH 2.36 09/23/2017   TSH 6.940 (H) 06/23/2017   FREET4 1.34 01/31/2018   FREET4 1.11 06/06/2017   FREET4 1.30 10/05/2016    HYPERLIPIDEMIA: not on treatment because of patient's refusal to take medications for fear of having pains, cramps, hair loss or various other possible side effects  Her LDL fluctuates and usually over 130  Lab Results  Component Value Date   CHOL 217 (H) 01/31/2018   CHOL 225 (H) 06/06/2017   CHOL 204 (H) 03/08/2017   Lab Results  Component Value Date   HDL 48.70 01/31/2018   HDL  49.00 06/06/2017   HDL 44.60 03/08/2017   Lab Results  Component Value Date   LDLCALC 144 (H) 01/31/2018   LDLCALC 156 (H) 06/06/2017   LDLCALC 137 (H) 03/08/2017   Lab Results  Component Value Date   TRIG 120.0 01/31/2018   TRIG 102.0 06/06/2017   TRIG 112.0 03/08/2017   Lab Results  Component Value Date   CHOLHDL 4 01/31/2018   CHOLHDL 5 06/06/2017   CHOLHDL 5 03/08/2017   Lab Results  Component Value Date   LDLDIRECT 193.5 02/07/2013   LDLDIRECT 189.5 12/11/2012   LDLDIRECT 201.3 12/05/2012      LEUKOCYTOSIS:  She has persistently high white blood cell count without abnormal morphology, white count may be much higher if she has an infection as in recent hospitalization  Periodically followed by hematologist  Lab Results  Component Value Date   WBC 11.4 (H) 05/24/2018   WBC 9.7 05/23/2018   WBC 22.0 (H) 05/22/2018   WBC 22.2 (H) 05/22/2018    Complaining of constipation recently despite taking MiraLAX chronically      Objective:   Physical Exam  BP 126/84 (BP  Location: Right Arm, Patient Position: Sitting, Cuff Size: Normal)    Pulse (!) 104    Temp 98 F (36.7 C)    Resp 12    SpO2 98%   Blood pressure rechecked second time was 118/78 Heart: Mild tachycardia without abnormal heart sounds or murmurs Lungs clear Left lower leg shows old ecchymoses with mild soft tissue swelling in the lower area around 3 to 4 cm across without warmth or tenderness No pedal edema     Assessment:      Recent episode of hypotension possibly sepsis without any documented source of infection or bacteremia Clinically doing well  HYPERTENSION: Controlled She is on benazepril HCTZ Blood pressure is fairly good despite having episode of hypotension in the hospital  Labs to be checked today for renal function   HYPOTHYROIDISM:  Has been taking 125 mcg and will need follow-up labs  History of osteoporosis followed by orthopedic surgeon: To be discussed on her following visit, will need to get report of her bone density  History of prediabetes: A1c to be checked  Constipation: Advised her that she can use milk of magnesia as needed  History of hypomagnesemia in the hospital: To have follow-up labs done today     Plan:     As above Treatment plan and medications reviewed with patient and her caregiver        [name] 06/06/18   Addendum: Labs indicate mild increase in hematocrit possibly from hemoconcentration Patient message as follows:  Please let patient know that white blood cell count is high along with high hemoglobin level indicating some dehydration.  Recommend she take only half blood pressure pill for the next 3 days since it has a diuretic and increase fluid intake.   Her thyroid and magnesium level normal.  She can stop magnesium tablets when finished Also needs to follow-up with her hematologist for white blood cell count Recommend follow-up visit in 1 month  Lewie Deman Dwyane Dee

## 2018-06-06 NOTE — Telephone Encounter (Signed)
Main lab just called with a critical white count of 20.0

## 2018-06-06 NOTE — Telephone Encounter (Signed)
FYI

## 2018-06-27 DIAGNOSIS — M170 Bilateral primary osteoarthritis of knee: Secondary | ICD-10-CM | POA: Diagnosis not present

## 2018-06-29 ENCOUNTER — Other Ambulatory Visit: Payer: Self-pay | Admitting: *Deleted

## 2018-06-29 DIAGNOSIS — I712 Thoracic aortic aneurysm, without rupture, unspecified: Secondary | ICD-10-CM

## 2018-07-03 ENCOUNTER — Inpatient Hospital Stay: Payer: Medicare Other | Admitting: Oncology

## 2018-07-03 ENCOUNTER — Inpatient Hospital Stay: Payer: Medicare Other

## 2018-07-04 ENCOUNTER — Telehealth: Payer: Self-pay | Admitting: Nurse Practitioner

## 2018-07-04 NOTE — Telephone Encounter (Signed)
Scheduled appt in 46mths per sch msg. Mailed printout.

## 2018-07-05 ENCOUNTER — Other Ambulatory Visit: Payer: Self-pay | Admitting: Endocrinology

## 2018-07-06 ENCOUNTER — Other Ambulatory Visit: Payer: Self-pay

## 2018-07-06 ENCOUNTER — Telehealth: Payer: Self-pay | Admitting: Endocrinology

## 2018-07-06 MED ORDER — POLYETHYLENE GLYCOL 3350 17 G PO PACK: 17.0000 g | PACK | Freq: Three times a day (TID) | ORAL | 3 refills | Status: DC | PRN

## 2018-07-06 NOTE — Telephone Encounter (Signed)
MEDICATION: polyethylene glycol (MIRALAX / GLYCOLAX) packet  PHARMACY:  [facility] 90 DAY SUPPLY :   IS PATIENT OUT OF MEDICATION: Yes  IF NOT; HOW MUCH IS LEFT:   LAST APPOINTMENT DATE: @4 /15/2020  NEXT APPOINTMENT DATE:@5 /03/2018  DO WE HAVE YOUR PERMISSION TO LEAVE A DETAILED MESSAGE:  OTHER COMMENTS:  Patient stated that Dr.Kumar has filled this for her before.  **Let patient know to contact pharmacy at the end of the day to make sure medication is ready. **  ** Please notify patient to allow 48-72 hours to process**  **Encourage patient to contact the pharmacy for refills or they can request refills through [facility]**

## 2018-07-06 NOTE — Telephone Encounter (Signed)
Rx sent 

## 2018-07-07 ENCOUNTER — Other Ambulatory Visit: Payer: Self-pay | Admitting: Endocrinology

## 2018-07-21 ENCOUNTER — Ambulatory Visit (INDEPENDENT_AMBULATORY_CARE_PROVIDER_SITE_OTHER): Payer: Medicare Other | Admitting: Endocrinology

## 2018-07-21 ENCOUNTER — Other Ambulatory Visit: Payer: Self-pay

## 2018-07-21 ENCOUNTER — Encounter: Payer: Self-pay | Admitting: Endocrinology

## 2018-07-21 VITALS — BP 124/60 | HR 100 | Ht 68.0 in

## 2018-07-21 DIAGNOSIS — I10 Essential (primary) hypertension: Secondary | ICD-10-CM | POA: Diagnosis not present

## 2018-07-21 DIAGNOSIS — D72829 Elevated white blood cell count, unspecified: Secondary | ICD-10-CM | POA: Diagnosis not present

## 2018-07-21 LAB — CBC
HCT: 43.4 % (ref 36.0–46.0)
Hemoglobin: 14.8 g/dL (ref 12.0–15.0)
MCHC: 34 g/dL (ref 30.0–36.0)
MCV: 88.7 fl (ref 78.0–100.0)
Platelets: 237 10*3/uL (ref 150.0–400.0)
RBC: 4.89 Mil/uL (ref 3.87–5.11)
RDW: 13.5 % (ref 11.5–15.5)
WBC: 12.6 10*3/uL — ABNORMAL HIGH (ref 4.0–10.5)

## 2018-07-21 LAB — BASIC METABOLIC PANEL
BUN: 16 mg/dL (ref 6–23)
CO2: 28 mEq/L (ref 19–32)
Calcium: 8.8 mg/dL (ref 8.4–10.5)
Chloride: 101 mEq/L (ref 96–112)
Creatinine, Ser: 0.49 mg/dL (ref 0.40–1.20)
GFR: 120.28 mL/min (ref >60.00)
Glucose, Bld: 114 mg/dL — ABNORMAL HIGH (ref 70–99)
Potassium: 3.7 mEq/L (ref 3.5–5.1)
Sodium: 139 mEq/L (ref 135–145)

## 2018-07-21 NOTE — Progress Notes (Signed)
Subjective:     Patient ID: Angela [name], female   DOB: 1932-10-22, 85 y.o.   MRN: [id number]  HPI  Reason for visit: Follow-up of various problems   Recent problems  EDEMA: She has not had any problems with this recently She has only rarely taking 20 mg of Lasix and her edema is much less of a problem However she does not monitor her weight at home as directed    Hypertension, She has had long-standing hypertension which has been well controlled.  Has been on Lotensin HCT 20/12.5 After her discharge and follow-up office visit she was told to take half a tablet for 3 days only because of finding of hemoconcentration and she did Does not monitor at home   BP Readings from Last 3 Encounters:  07/21/18 124/60  06/06/18 126/84  05/25/18 (!) 164/97    PREDIABETES: She has had long-standing mildly impaired fasting glucose  However A1c has been consistently normal and around 6%  In the past had been treated with metformin She may tend to have higher readings if she has a steroid injection in her knee Most recent blood sugars have been normal    Lab Results  Component Value Date   HGBA1C 5.9 06/06/2018   HGBA1C 6.0 01/31/2018   HGBA1C 6.0 06/06/2017   Lab Results  Component Value Date   MICROALBUR 0.4 12/05/2012   LDLCALC 144 (H) 01/31/2018   CREATININE 0.65 06/06/2018   OTHER active problems are detailed in review of systems:   Allergies as of 07/21/2018      Reactions   Crestor [rosuvastatin Calcium] Anaphylaxis, Other (See Comments)   Abdominal discomfort   Gadolinium Nausea And Vomiting    pt states she had nausea after receiving MAGNEVIST for breast imaging   Pravastatin Other (See Comments)   Locked jaw, weakness   Simvastatin Other (See Comments)   Weakness and locked jaw.   Penicillins Swelling, Rash   Patient tolerates amoxicillin Has patient had a PCN reaction causing immediate rash, facial/tongue/throat swelling, SOB or lightheadedness  with hypotension: yes Has patient had a PCN reaction causing severe rash involving mucus membranes or skin necrosis: unknown Has patient had a PCN reaction that required hospitalization: unknown Has patient had a PCN reaction occurring within the last 10 years: no If all of the above answers are "NO", then may proceed with Cephalosporin use.      Medication List       Accurate as of Jul 21, 2018 11:22 AM. Always use your most recent med list.        apixaban 5 MG Tabs tablet Commonly known as:  Eliquis Take 1 tablet (5 mg total) by mouth 2 (two) times daily.   benazepril-hydrochlorthiazide 20-12.5 MG tablet Commonly known as:  LOTENSIN HCT Take 1 tablet by mouth daily.   furosemide 20 MG tablet Commonly known as:  LASIX 1 TABLET ONCE DAILY FOR EDEMA (IF WEIGHT HIGH BY 2 POUNDS IN 24 HRS, WEIGH DAILY.)   ICAPS AREDS 2 PO Take 1 tablet by mouth daily.   isosorbide mononitrate 30 MG 24 hr tablet Commonly known as:  IMDUR TAKE 1 TABLET ONCE DAILY.   LORazepam 0.5 MG tablet Commonly known as:  ATIVAN TAKE (1) TABLET TWICE DAILY AS NEEDED FOR ANXIETY.   pantoprazole 40 MG tablet Commonly known as:  PROTONIX Take 1 tablet (40 mg total) by mouth daily.   Percocet 10-325 MG tablet Generic drug:  oxyCODONE-acetaminophen Take 0.25-1 tablets by mouth  every 6 (six) hours as needed for pain.   polyethylene glycol 17 g packet Commonly known as:  MIRALAX / GLYCOLAX Take 17 g by mouth 3 (three) times daily as needed for moderate constipation.   potassium chloride 10 MEQ tablet Commonly known as:  K-DUR Take 2 tablets (20 mEq total) by mouth daily for 7 days.   PRESCRIPTION MEDICATION Inject 1 application as directed every 30 (thirty) days. Cortisone injections, each knee   Synthroid 125 MCG tablet Generic drug:  levothyroxine TAKE 1 TABLET IN THE MORNING ON AN EMPTY STOMACH.   Ventolin HFA 108 (90 Base) MCG/ACT inhaler Generic drug:  albuterol USE 2 PUFFS 4 TIMES A DAY AS  NEEDED FOR WHEEZE OR SHORTNESS OF BREATH.   Vitamin D (Ergocalciferol) 1.25 MG (50000 UT) Caps capsule Commonly known as:  DRISDOL TAKE ONE CAPSULE ONCE A WEEK.   Voltaren 1 % Gel Generic drug:  diclofenac sodium Apply 2 g topically 4 (four) times daily as needed (pain). To knees       Review of Systems   OSTEOPOROSIS: The following is a copy of the previous note  She had developed a vertebral fracture late 2018; she thinks she also developed rib fractures in the mid 2018 X-ray reports as follows: Vertebral augmentation at T12. A mild T7 compression deformity is new since June. A mild to moderate T9 compression deformity is not significantly changed. Bone density report still not available from orthopedic surgeon on record   Previously had a course of calcitonin that was started in 01/2017 with relief of her pain  She has not agreed to do Prolia injection despite repeated explanations and medication being approved by her insurance   Primary hypothyroidism: She has had long-standing primary hypothyroidism.   Has been taking 125 mcg levothyroxine since 06/2017, previously on a smaller dose  She takes her supplement consistently before breakfast  TSH levels:   Lab Results  Component Value Date   TSH 1.65 06/06/2018   TSH 1.02 01/31/2018   TSH 2.36 09/23/2017   FREET4 1.29 06/06/2018   FREET4 1.34 01/31/2018   FREET4 1.11 06/06/2017    HYPERLIPIDEMIA: not on a statin drug because of patient's refusal to take medications for fear of having pains, cramps, hair loss or various other possible side effects  Her LDL fluctuates and usually over 130  Lab Results  Component Value Date   CHOL 217 (H) 01/31/2018   CHOL 225 (H) 06/06/2017   CHOL 204 (H) 03/08/2017   Lab Results  Component Value Date   HDL 48.70 01/31/2018   HDL 49.00 06/06/2017   HDL 44.60 03/08/2017   Lab Results  Component Value Date   LDLCALC 144 (H) 01/31/2018   LDLCALC 156 (H) 06/06/2017    LDLCALC 137 (H) 03/08/2017   Lab Results  Component Value Date   TRIG 120.0 01/31/2018   TRIG 102.0 06/06/2017   TRIG 112.0 03/08/2017   Lab Results  Component Value Date   CHOLHDL 4 01/31/2018   CHOLHDL 5 06/06/2017   CHOLHDL 5 03/08/2017   Lab Results  Component Value Date   LDLDIRECT 193.5 02/07/2013   LDLDIRECT 189.5 12/11/2012   LDLDIRECT 201.3 12/05/2012      LEUKOCYTOSIS:  She has persistently high white blood cell count without abnormal morphology, white count may be much higher if she has an infection or recent steroid injection  Periodically followed by hematologist, no recent visit  Lab Results  Component Value Date   WBC 20.0 Repeated and verified  X2. (HH) 06/06/2018   WBC 11.4 (H) 05/24/2018   WBC 9.7 05/23/2018   WBC 22.0 (H) 05/22/2018    She was told to take milk of magnesia as needed for constipation along with her MiraLAX, she has less of a problem  No recent dyspnea on exertion      Objective:   Physical Exam  BP 124/60 (BP Location: Left Arm, Patient Position: Sitting, Cuff Size: Normal)    Pulse 100    Ht 5\' 8"  (1.727 m)    SpO2 94%    BMI 41.16 kg/m    He has a short systolic ejection murmur over the left sternal border Heart rate is about 84, regular No abnormal heart sounds Lungs clear     Assessment:      Leukocytosis and increased hemoglobin previously: This may have been high because of preceding infection and some hemoconcentration  HYPERTENSION: Well controlled without any orthostatic lightheadedness She is on benazepril HCTZ She will continue the same regimen  Labs to be checked today for renal function   HYPOTHYROIDISM:  Has been taking 125 mcg and will need follow-on the next visit  History of prediabetes: A1c to be checked On the next visit     Plan:     Check CBC and chemistry labs today     [name] 07/21/18      [name]

## 2018-07-22 NOTE — Progress Notes (Signed)
Please call to let patient know that the lab results are to baseline.  Also no need for potassium

## 2018-07-25 DIAGNOSIS — M170 Bilateral primary osteoarthritis of knee: Secondary | ICD-10-CM | POA: Diagnosis not present

## 2018-08-04 ENCOUNTER — Other Ambulatory Visit: Payer: Self-pay | Admitting: Endocrinology

## 2018-08-15 DIAGNOSIS — Z961 Presence of intraocular lens: Secondary | ICD-10-CM | POA: Diagnosis not present

## 2018-08-15 DIAGNOSIS — H353132 Nonexudative age-related macular degeneration, bilateral, intermediate dry stage: Secondary | ICD-10-CM | POA: Diagnosis not present

## 2018-08-15 DIAGNOSIS — H5213 Myopia, bilateral: Secondary | ICD-10-CM | POA: Diagnosis not present

## 2018-08-22 DIAGNOSIS — M170 Bilateral primary osteoarthritis of knee: Secondary | ICD-10-CM | POA: Diagnosis not present

## 2018-08-24 ENCOUNTER — Other Ambulatory Visit: Payer: Self-pay | Admitting: Endocrinology

## 2018-08-28 DIAGNOSIS — M179 Osteoarthritis of knee, unspecified: Secondary | ICD-10-CM | POA: Diagnosis not present

## 2018-08-28 DIAGNOSIS — M5136 Other intervertebral disc degeneration, lumbar region: Secondary | ICD-10-CM | POA: Diagnosis not present

## 2018-08-28 DIAGNOSIS — M1990 Unspecified osteoarthritis, unspecified site: Secondary | ICD-10-CM | POA: Diagnosis not present

## 2018-08-28 DIAGNOSIS — M4856XD Collapsed vertebra, not elsewhere classified, lumbar region, subsequent encounter for fracture with routine healing: Secondary | ICD-10-CM | POA: Diagnosis not present

## 2018-08-30 DIAGNOSIS — D0471 Carcinoma in situ of skin of right lower limb, including hip: Secondary | ICD-10-CM | POA: Diagnosis not present

## 2018-08-30 DIAGNOSIS — L821 Other seborrheic keratosis: Secondary | ICD-10-CM | POA: Diagnosis not present

## 2018-08-30 DIAGNOSIS — Z85828 Personal history of other malignant neoplasm of skin: Secondary | ICD-10-CM | POA: Diagnosis not present

## 2018-08-30 DIAGNOSIS — L820 Inflamed seborrheic keratosis: Secondary | ICD-10-CM | POA: Diagnosis not present

## 2018-08-30 DIAGNOSIS — D485 Neoplasm of uncertain behavior of skin: Secondary | ICD-10-CM | POA: Diagnosis not present

## 2018-08-31 ENCOUNTER — Other Ambulatory Visit: Payer: Self-pay | Admitting: Endocrinology

## 2018-09-06 ENCOUNTER — Ambulatory Visit: Payer: Medicare Other | Admitting: Endocrinology

## 2018-09-11 DIAGNOSIS — M1990 Unspecified osteoarthritis, unspecified site: Secondary | ICD-10-CM | POA: Diagnosis not present

## 2018-09-11 DIAGNOSIS — M4856XD Collapsed vertebra, not elsewhere classified, lumbar region, subsequent encounter for fracture with routine healing: Secondary | ICD-10-CM | POA: Diagnosis not present

## 2018-09-11 DIAGNOSIS — M179 Osteoarthritis of knee, unspecified: Secondary | ICD-10-CM | POA: Diagnosis not present

## 2018-09-11 DIAGNOSIS — M5136 Other intervertebral disc degeneration, lumbar region: Secondary | ICD-10-CM | POA: Diagnosis not present

## 2018-09-14 DIAGNOSIS — M2041 Other hammer toe(s) (acquired), right foot: Secondary | ICD-10-CM | POA: Diagnosis not present

## 2018-09-14 DIAGNOSIS — I739 Peripheral vascular disease, unspecified: Secondary | ICD-10-CM | POA: Diagnosis not present

## 2018-09-14 DIAGNOSIS — M2042 Other hammer toe(s) (acquired), left foot: Secondary | ICD-10-CM | POA: Diagnosis not present

## 2018-09-14 DIAGNOSIS — L602 Onychogryphosis: Secondary | ICD-10-CM | POA: Diagnosis not present

## 2018-09-14 DIAGNOSIS — L84 Corns and callosities: Secondary | ICD-10-CM | POA: Diagnosis not present

## 2018-09-19 DIAGNOSIS — H0014 Chalazion left upper eyelid: Secondary | ICD-10-CM | POA: Diagnosis not present

## 2018-09-19 DIAGNOSIS — M170 Bilateral primary osteoarthritis of knee: Secondary | ICD-10-CM | POA: Diagnosis not present

## 2018-09-21 ENCOUNTER — Other Ambulatory Visit: Payer: Self-pay

## 2018-09-21 ENCOUNTER — Ambulatory Visit
Admission: RE | Admit: 2018-09-21 | Discharge: 2018-09-21 | Disposition: A | Discharge status: Home / Self Care | Payer: Medicare Other | Source: Ambulatory Visit | Attending: Thoracic Surgery (Cardiothoracic Vascular Surgery) | Admitting: Thoracic Surgery (Cardiothoracic Vascular Surgery)

## 2018-09-21 DIAGNOSIS — I712 Thoracic aortic aneurysm, without rupture, unspecified: Secondary | ICD-10-CM

## 2018-09-21 MED ORDER — IOPAMIDOL (ISOVUE-370) INJECTION 76%
75.0000 mL | Freq: Once | INTRAVENOUS | Status: AC | PRN
  Administered 2018-09-21: 75 mL via INTRAVENOUS

## 2018-09-25 ENCOUNTER — Other Ambulatory Visit: Payer: Self-pay

## 2018-09-26 ENCOUNTER — Encounter: Payer: Self-pay | Admitting: Thoracic Surgery (Cardiothoracic Vascular Surgery)

## 2018-09-26 ENCOUNTER — Ambulatory Visit (INDEPENDENT_AMBULATORY_CARE_PROVIDER_SITE_OTHER): Payer: Medicare Other | Admitting: Thoracic Surgery (Cardiothoracic Vascular Surgery)

## 2018-09-26 VITALS — BP 127/78 | HR 103 | Temp 97.5°F | Resp 16 | Ht 68.0 in

## 2018-09-26 DIAGNOSIS — I712 Thoracic aortic aneurysm, without rupture: Secondary | ICD-10-CM

## 2018-09-26 DIAGNOSIS — I7121 Aneurysm of the ascending aorta, without rupture: Secondary | ICD-10-CM

## 2018-09-26 NOTE — Progress Notes (Signed)
[address]       [place] [address]             [phone]      HPI: [name] returns for a scheduled follow-up  Angela [name] Angela [name] is an 85 year old woman with a history of DVT, PE, breast cancer, type 2 diabetes, hypertension, hyperlipidemia, heart murmur, reflux, thoracic aortic atherosclerosis, an ascending aortic aneurysm, and arthritis.  She was first found to have a 4 cm ascending aneurysm in June 2017 on a CT that was done to evaluate for pulmonary embolus.  She has been followed since that time.  In the interim since her last visit she was hospitalized in March with septic shock.  She says that they were never able to determine what the underlying cause was but she did recover from that.  She feels better and is able to move around better since she had her knees injected with cortisol couple of weeks ago.  She does ambulate with a walker.  She is not having any chest pain.  Past Medical History:  Diagnosis Date  . Arthritis   . BRCA2 positive 10/214  . Breast cancer ([place]) 1994   left sided cancer; unilateral mastectomy, no chemo or radiation  . Diabetes mellitus   . Dyslipidemia   . GERD (gastroesophageal reflux disease)   . Heart murmur   . Hypertension     Current Outpatient Medications  Medication Sig Dispense Refill  . albuterol (VENTOLIN HFA) 108 (90 Base) MCG/ACT inhaler Inhale 2 puffs into the lungs 4 (four) times daily as needed for wheezing or shortness of breath. 18 g 1  . apixaban (ELIQUIS) 5 MG TABS tablet Take 1 tablet (5 mg total) by mouth 2 (two) times daily. 60 tablet 3  . benazepril-hydrochlorthiazide (LOTENSIN HCT) 20-12.5 MG tablet Take 1 tablet by mouth daily. 30 tablet 3  . furosemide (LASIX) 20 MG tablet 1 TABLET ONCE DAILY FOR EDEMA (IF WEIGHT HIGH BY 2 POUNDS IN 24 HRS, WEIGH DAILY.) (Patient taking differently: Take 10-20 mg by mouth daily as needed for fluid or edema (If weight gain by 2lbs in 24 hours). ) 30  tablet 0  . isosorbide mononitrate (IMDUR) 30 MG 24 hr tablet TAKE 1 TABLET ONCE DAILY. 30 tablet 0  . LORazepam (ATIVAN) 0.5 MG tablet TAKE (1) TABLET TWICE DAILY AS NEEDED FOR ANXIETY. (Patient taking differently: Take 0.5 mg by mouth 2 (two) times daily as needed for anxiety. TAKE (1) TABLET TWICE DAILY AS NEEDED FOR ANXIETY.) 60 tablet 0  . Multiple Vitamins-Minerals (ICAPS AREDS 2 PO) Take 1 tablet by mouth daily.    . pantoprazole (PROTONIX) 40 MG tablet Take 1 tablet (40 mg total) by mouth daily. 30 tablet 3  . PERCOCET 10-325 MG per tablet Take 0.25-1 tablets by mouth every 6 (six) hours as needed for pain.     . polyethylene glycol (MIRALAX / GLYCOLAX) 17 g packet Take 17 g by mouth 3 (three) times daily as needed for moderate constipation. 90 packet 3  . PRESCRIPTION MEDICATION Inject 1 application as directed every 30 (thirty) days. Cortisone injections, each knee    . SYNTHROID 125 MCG tablet TAKE 1 TABLET IN THE MORNING ON AN EMPTY STOMACH. 30 tablet 0  . Vitamin D, Ergocalciferol, (DRISDOL) 1.25 MG (50000 UT) CAPS capsule TAKE ONE CAPSULE ONCE A WEEK. 4 capsule 0  . VOLTAREN 1 % GEL Apply 2 g topically 4 (four) times daily as needed (  pain). To knees     No current facility-administered medications for this visit.     Physical Exam BP 127/78 (BP Location: Left Arm, Patient Position: Sitting, Cuff Size: Large)   Pulse (!) 103   Temp (!) 97.5 F (36.4 C) Comment: thermal  Resp 16   Ht '5\' 8"'  (1.727 m)   SpO2 94% Comment: RA  BMI 41.64 kg/m  85 year old woman in no acute distress Alert and oriented times 3 Lungs clear with equal breath sounds bilaterally Cardiac mildly tachycardic, regular, 2/6 systolic murmur Some venous stasis changes but no peripheral edema  Diagnostic Tests: CT ANGIOGRAPHY CHEST WITH CONTRAST  TECHNIQUE: Multidetector CT imaging of the chest was performed using the standard protocol during bolus administration of intravenous contrast. Multiplanar CT  image reconstructions and MIPs were obtained to evaluate the vascular anatomy.  CONTRAST:  69m ISOVUE-370 IOPAMIDOL (ISOVUE-370) INJECTION 76%  COMPARISON:  05/22/2018, 09/06/2017, 01/09/2017, 08/30/2015  FINDINGS: Cardiovascular:  Heart:  No cardiomegaly. No pericardial fluid/thickening. No significant coronary calcifications.  Aorta:  Unchanged course caliber contour of the thoracic aorta. Greatest diameter of the ascending aorta measures 4.1 cm on axial images. The most remote CT available dated 08/30/2015 demonstrates a measurement of approximately 4 cm. Mild atherosclerosis of the aortic arch. Three vessel arch with the branch vessels patent.  Minimal plaque of the descending thoracic aorta. No aneurysm of descending aorta.  Pulmonary arteries:  Suboptimal bolus timing for sensitive evaluation of the pulmonary arteries. No central/proximal filling defects identified.  Mediastinum/Nodes: No mediastinal adenopathy. Unremarkable appearance of the thoracic esophagus.  Unremarkable thoracic inlet.  Chest wall:  Bilateral breast reconstruction. No axillary adenopathy. No supraclavicular adenopathy.  Lungs/Pleura: Central airways are clear. No pleural effusion. No confluent airspace disease.  No pneumothorax.  Minimal atelectasis/scarring.  Upper Abdomen: No acute.  Musculoskeletal: Osteopenia. Redemonstration of mild compression fracture of T3, with redemonstration of more moderate compression fracture of T7 and T9. No significant progression of height loss. Treatment changes at T12. No new fracture identified. No bony canal narrowing. No other evidence of acute displaced fracture.  Review of the MIP images confirms the above findings.  IMPRESSION: Redemonstration of thoracic aortic aneurysm measuring 4.1 cm. This is relatively unchanged from the most remote comparison CT of 08/30/2015 when the measurement was estimated 4 cm.  Aortic  Atherosclerosis (ICD10-I70.0).  Additional ancillary findings as above.  Signed,  JDulcy Fanny WDellia Nims RPVI  [facility]  [facility]   Electronically Signed   By: JCorrie MckusickD.O.   On: 09/21/2018 12:41  I personally reviewed the CT images and concur with the findings noted above  Impression: KJonea Bukowskiis an 85year old woman with a history of DVT, PE, breast cancer, type 2 diabetes, hypertension, hyperlipidemia, heart murmur, reflux, thoracic aortic atherosclerosis, an ascending aortic aneurysm, and arthritis.  Thoracic aortic atherosclerosis/ascending aneurysm-aneurysm noted in 2017 when it was 4 cm.  Now 4.1 cm unchanged from a year ago.  Needs continued annual follow-up.  Hypertension-well-controlled on current regimen  PE-remains on Eliquis  Plan: Return in 1 year with CT angiogram of chest  SMelrose Nakayama MD [facility] ([phone]

## 2018-10-04 ENCOUNTER — Other Ambulatory Visit: Payer: Self-pay | Admitting: Endocrinology

## 2018-10-10 DIAGNOSIS — M5136 Other intervertebral disc degeneration, lumbar region: Secondary | ICD-10-CM | POA: Diagnosis not present

## 2018-10-10 DIAGNOSIS — M179 Osteoarthritis of knee, unspecified: Secondary | ICD-10-CM | POA: Diagnosis not present

## 2018-10-10 DIAGNOSIS — M4856XD Collapsed vertebra, not elsewhere classified, lumbar region, subsequent encounter for fracture with routine healing: Secondary | ICD-10-CM | POA: Diagnosis not present

## 2018-10-10 DIAGNOSIS — M1990 Unspecified osteoarthritis, unspecified site: Secondary | ICD-10-CM | POA: Diagnosis not present

## 2018-10-25 ENCOUNTER — Other Ambulatory Visit: Payer: Self-pay

## 2018-10-27 ENCOUNTER — Encounter: Payer: Self-pay | Admitting: Endocrinology

## 2018-10-27 ENCOUNTER — Other Ambulatory Visit: Payer: Self-pay

## 2018-10-27 ENCOUNTER — Ambulatory Visit (INDEPENDENT_AMBULATORY_CARE_PROVIDER_SITE_OTHER): Payer: Medicare Other | Admitting: Endocrinology

## 2018-10-27 VITALS — BP 140/82 | HR 108 | Ht 68.0 in

## 2018-10-27 DIAGNOSIS — I10 Essential (primary) hypertension: Secondary | ICD-10-CM | POA: Diagnosis not present

## 2018-10-27 DIAGNOSIS — R7303 Prediabetes: Secondary | ICD-10-CM

## 2018-10-27 DIAGNOSIS — E063 Autoimmune thyroiditis: Secondary | ICD-10-CM

## 2018-10-27 DIAGNOSIS — E785 Hyperlipidemia, unspecified: Secondary | ICD-10-CM

## 2018-10-27 DIAGNOSIS — E559 Vitamin D deficiency, unspecified: Secondary | ICD-10-CM

## 2018-10-27 LAB — CBC WITH DIFFERENTIAL/PLATELET
Basophils Absolute: 0.1 10*3/uL (ref 0.0–0.1)
Basophils Relative: 1 % (ref 0.0–3.0)
Eosinophils Absolute: 0.2 10*3/uL (ref 0.0–0.7)
Eosinophils Relative: 2 % (ref 0.0–5.0)
HCT: 43.3 % (ref 36.0–46.0)
Hemoglobin: 14.6 g/dL (ref 12.0–15.0)
Lymphocytes Relative: 30.8 % (ref 12.0–46.0)
Lymphs Abs: 2.8 10*3/uL (ref 0.7–4.0)
MCHC: 33.6 g/dL (ref 30.0–36.0)
MCV: 90.3 fl (ref 78.0–100.0)
Monocytes Absolute: 0.8 10*3/uL (ref 0.1–1.0)
Monocytes Relative: 8.7 % (ref 3.0–12.0)
Neutro Abs: 5.2 10*3/uL (ref 1.4–7.7)
Neutrophils Relative %: 57.5 % (ref 43.0–77.0)
Platelets: 256 10*3/uL (ref 150.0–400.0)
RBC: 4.8 Mil/uL (ref 3.87–5.11)
RDW: 13.6 % (ref 11.5–15.5)
WBC: 9.1 10*3/uL (ref 4.0–10.5)

## 2018-10-27 LAB — HEMOGLOBIN A1C: Hgb A1c MFr Bld: 5.9 % (ref 4.6–6.5)

## 2018-10-27 LAB — COMPREHENSIVE METABOLIC PANEL
ALT: 19 U/L (ref 0–35)
AST: 21 U/L (ref 0–37)
Albumin: 3.6 g/dL (ref 3.5–5.2)
Alkaline Phosphatase: 104 U/L (ref 39–117)
BUN: 10 mg/dL (ref 6–23)
CO2: 24 mEq/L (ref 19–32)
Calcium: 8.8 mg/dL (ref 8.4–10.5)
Chloride: 99 mEq/L (ref 96–112)
Creatinine, Ser: 0.6 mg/dL (ref 0.40–1.20)
GFR: 95.15 mL/min (ref >60.00)
Glucose, Bld: 121 mg/dL — ABNORMAL HIGH (ref 70–99)
Potassium: 3.6 mEq/L (ref 3.5–5.1)
Sodium: 137 mEq/L (ref 135–145)
Total Bilirubin: 0.7 mg/dL (ref 0.2–1.2)
Total Protein: 6.2 g/dL (ref 6.0–8.3)

## 2018-10-27 LAB — TSH: TSH: 0.56 u[IU]/mL (ref 0.35–4.50)

## 2018-10-27 LAB — LIPID PANEL
Cholesterol: 196 mg/dL (ref 0–200)
HDL: 38.1 mg/dL — ABNORMAL LOW (ref >39.00)
LDL Cholesterol: 130 mg/dL — ABNORMAL HIGH (ref 0–99)
NonHDL: 158.35
Total CHOL/HDL Ratio: 5
Triglycerides: 144 mg/dL (ref 0.0–149.0)
VLDL: 28.8 mg/dL (ref 0.0–40.0)

## 2018-10-27 LAB — VITAMIN D 25 HYDROXY (VIT D DEFICIENCY, FRACTURES): VITD: 32.41 ng/mL (ref 30.00–100.00)

## 2018-10-27 LAB — T4, FREE: Free T4: 1.43 ng/dL (ref 0.60–1.60)

## 2018-10-27 NOTE — Progress Notes (Signed)
Subjective:     Patient ID: Angela [name], female   DOB: [birth year], 85 y.o.   MRN: [id number]  HPI  Reason for visit: Follow-up of various problems  Hypertension, She has had long-standing hypertension which has been well controlled consistently.  Has been on Lotensin HCT 20/12.5 long-term  Does not monitor blood pressure at home   BP Readings from Last 3 Encounters:  10/27/18 140/82  09/26/18 127/78  07/21/18 124/60    EDEMA: This has been present in the past and likely from venous insufficiency  She has only occasionally taken 20 mg of Lasix Her edema is insignificant recently   PREDIABETES: She has had long-standing mildly impaired fasting glucose  However A1c has been consistently normal and around 6%  In the past had been treated with metformin She thinks her blood sugars at home, checked sporadically are between 90-100 mostly  Labs as follows    Lab Results  Component Value Date   HGBA1C 5.9 06/06/2018   HGBA1C 6.0 01/31/2018   HGBA1C 6.0 06/06/2017   Lab Results  Component Value Date   MICROALBUR 0.4 12/05/2012   LDLCALC 144 (H) 01/31/2018   CREATININE 0.49 07/21/2018    OTHER active problems are detailed in review of systems:   Allergies as of 10/27/2018      Reactions   Crestor [rosuvastatin Calcium] Anaphylaxis, Other (See Comments)   Abdominal discomfort   Gadolinium Nausea And Vomiting    pt states she had nausea after receiving MAGNEVIST for breast imaging   Pravastatin Other (See Comments)   Locked jaw, weakness   Simvastatin Other (See Comments)   Weakness and locked jaw.   Penicillins Swelling, Rash   Patient tolerates amoxicillin Has patient had a PCN reaction causing immediate rash, facial/tongue/throat swelling, SOB or lightheadedness with hypotension: yes Has patient had a PCN reaction causing severe rash involving mucus membranes or skin necrosis: unknown Has patient had a PCN reaction that required hospitalization:  unknown Has patient had a PCN reaction occurring within the last 10 years: no If all of the above answers are "NO", then may proceed with Cephalosporin use.      Medication List       Accurate as of October 27, 2018 10:54 AM. If you have any questions, ask your nurse or doctor.        albuterol 108 (90 Base) MCG/ACT inhaler Commonly known as: Ventolin HFA Inhale 2 puffs into the lungs 4 (four) times daily as needed for wheezing or shortness of breath.   apixaban 5 MG Tabs tablet Commonly known as: Eliquis Take 1 tablet (5 mg total) by mouth 2 (two) times daily.   benazepril-hydrochlorthiazide 20-12.5 MG tablet Commonly known as: LOTENSIN HCT Take 1 tablet by mouth daily.   furosemide 20 MG tablet Commonly known as: LASIX 1 TABLET ONCE DAILY FOR EDEMA (IF WEIGHT HIGH BY 2 POUNDS IN 24 HRS, WEIGH DAILY.) What changed: See the new instructions.   ICAPS AREDS 2 PO Take 1 tablet by mouth daily.   isosorbide mononitrate 30 MG 24 hr tablet Commonly known as: IMDUR TAKE 1 TABLET ONCE DAILY.   LORazepam 0.5 MG tablet Commonly known as: ATIVAN TAKE (1) TABLET TWICE DAILY AS NEEDED FOR ANXIETY. What changed: See the new instructions.   pantoprazole 40 MG tablet Commonly known as: PROTONIX Take 1 tablet (40 mg total) by mouth daily.   Percocet 10-325 MG tablet Generic drug: oxyCODONE-acetaminophen Take 0.25-1 tablets by mouth every 6 (six) hours as  needed for pain.   polyethylene glycol 17 g packet Commonly known as: MIRALAX / GLYCOLAX Take 17 g by mouth 3 (three) times daily as needed for moderate constipation.   PRESCRIPTION MEDICATION Inject 1 application as directed every 30 (thirty) days. Cortisone injections, each knee   Synthroid 125 MCG tablet Generic drug: levothyroxine TAKE 1 TABLET IN THE MORNING ON AN EMPTY STOMACH.   Vitamin D (Ergocalciferol) 1.25 MG (50000 UT) Caps capsule Commonly known as: DRISDOL TAKE ONE CAPSULE ONCE A WEEK.   Voltaren 1 % Gel  Generic drug: diclofenac sodium Apply 2 g topically 4 (four) times daily as needed (pain). To knees       Review of Systems   OSTEOPOROSIS:  She had developed a vertebral fracture late 2018; she thinks she also developed rib fractures in the mid 2018 X-ray reports as follows: Vertebral augmentation at T12. A mild T7 compression deformity is new since June. A mild to moderate T9 compression deformity is not significantly changed. Bone density report still not available from orthopedic surgeon on record Also she did not follow-up with the specialist orthopedic surgeon who was supposed to start treatment  Previously had a course of calcitonin that was started in 01/2017 with relief of her pain  She is still reluctant to do Prolia injection despite repeated explanations and medication being approved by her insurance Today again discussed that potential complications from fracture could be disabling or life-threatening and with her continue to to get intra-articular steroids almost every month she is at high risk for fracture   Primary hypothyroidism: She has had long-standing primary hypothyroidism.   Has been taking 125 mcg levothyroxine since 06/2017,  She takes her levothyroxine without food using only water before breakfast In the last month may have missed a couple of doses but not in the last week or so  TSH levels:   Lab Results  Component Value Date   TSH 1.65 06/06/2018   TSH 1.02 01/31/2018   TSH 2.36 09/23/2017   FREET4 1.29 06/06/2018   FREET4 1.34 01/31/2018   FREET4 1.11 06/06/2017    HYPERLIPIDEMIA: not on a statin drug because of patient's refusal to take medications for fear of having pains, cramps, hair loss or various other possible side effects  Her LDL usually is over 130 and under 160  Lab Results  Component Value Date   CHOL 217 (H) 01/31/2018   CHOL 225 (H) 06/06/2017   CHOL 204 (H) 03/08/2017   Lab Results  Component Value Date   HDL 48.70  01/31/2018   HDL 49.00 06/06/2017   HDL 44.60 03/08/2017   Lab Results  Component Value Date   LDLCALC 144 (H) 01/31/2018   LDLCALC 156 (H) 06/06/2017   LDLCALC 137 (H) 03/08/2017   Lab Results  Component Value Date   TRIG 120.0 01/31/2018   TRIG 102.0 06/06/2017   TRIG 112.0 03/08/2017   Lab Results  Component Value Date   CHOLHDL 4 01/31/2018   CHOLHDL 5 06/06/2017   CHOLHDL 5 03/08/2017   Lab Results  Component Value Date   LDLDIRECT 193.5 02/07/2013   LDLDIRECT 189.5 12/11/2012   LDLDIRECT 201.3 12/05/2012      LEUKOCYTOSIS:  She has persistently high white blood cell count without abnormal morphology, white count may be much higher if she has an infection or following steroid injection  Previously followed by hematologist White count has been persistently around 12-13,000 recently  Lab Results  Component Value Date   WBC 12.6 (H)  07/21/2018   WBC 20.0 Repeated and verified X2. (HH) 06/06/2018   WBC 11.4 (H) 05/24/2018   WBC 9.7 05/23/2018    She Has Had Periodic Constipation, Treated with Miralax        Objective:   Physical Exam  BP 140/82 (BP Location: Left Arm, Patient Position: Sitting, Cuff Size: Normal)   Pulse (!) 108   Ht 5\' 8"  (1.727 m)   SpO2 95%   BMI 41.16 kg/m        Assessment:      OSTEOPOROSIS: As discussed above she does need to be on long-term treatment with continued steroid use intra-articularly on a very regular basis With previous history of fracture she is at high risk She does agree to start this  HYPERTENSION: Well controlled consistently She is on benazepril HCTZ as before She will continue the same regimen  Chemistry panel to be checked today   HYPOTHYROIDISM:  Has been taking 125 mcg with occasional irregularity To have labs checked today  Hyperlipidemia: She has mild hypercholesterolemia, has refused statin drugs  History of prediabetes: This has been usually stable She generally tries to maintain a  relatively good diet A1c to be checked Also random glucose will be checked today  Preventive care: She is up-to-date with pneumonia vaccines Colonoscopy not indicated at her age      Plan:     As above, will be contacted next week for starting Prolia     [name] 10/27/18      [name]

## 2018-10-27 NOTE — Patient Instructions (Signed)
Must start Prolia for Osteopororis

## 2018-10-29 NOTE — Progress Notes (Signed)
Please call to let patient know that white blood cell count is normal, cholesterol is slightly better at 196, thyroid, vitamin D, potassium all okay. Angela [name] please get her in for Prolia

## 2018-11-01 ENCOUNTER — Telehealth: Payer: Self-pay

## 2018-11-01 NOTE — Telephone Encounter (Signed)
Spoke to the patient today about Dr. Jodelle Green request for her to start Prolia-patient has declined this again at this time-patient also asked if the letter about extending the hours for her home nurse has been completed and mailed yet or will it need to be picked up-please advise of the status on this letter

## 2018-11-01 NOTE — Telephone Encounter (Signed)
I have not seen any info on this

## 2018-11-01 NOTE — Telephone Encounter (Signed)
Did you agree to write her a letter?

## 2018-11-02 NOTE — Telephone Encounter (Signed)
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 Pt is asking for another letter like this to go along with the paperwork that she will be providing.

## 2018-11-03 ENCOUNTER — Telehealth: Payer: Self-pay | Admitting: Endocrinology

## 2018-11-03 ENCOUNTER — Other Ambulatory Visit: Payer: Self-pay | Admitting: Endocrinology

## 2018-11-03 MED ORDER — ONDANSETRON HCL 4 MG PO TABS: 4.0000 mg | ORAL_TABLET | Freq: Three times a day (TID) | ORAL | 0 refills | Status: DC | PRN

## 2018-11-03 MED ORDER — PANTOPRAZOLE SODIUM 40 MG PO TBEC: 40.0000 mg | DELAYED_RELEASE_TABLET | Freq: Every day | ORAL | 3 refills | Status: DC

## 2018-11-03 NOTE — Telephone Encounter (Signed)
Pt states that the last few mornings after she takes her Synthroid with water, and she eats approx 1 hour later, she is experiencing nausea and vomiting. She sometimes experiences this in the afternoon as well, but not as severe as the morning time. Pt was informed that if the problem just began two days ago, it may not be the synthroid, but I would let the MD know and call her back.  Asked pt if she has had frequent bowel movements. Explained that in elderly pt's, vomiting can sometimes be contributed to constipation. Pt stated that she has been constipated and has not had a BM in 3 days, but she has been trying. Informed pt that Dr. Dwyane Dee would be made aware of all of her concerns, and if he has any additional instructions, I would call her with MD message later. Pt verbalized understanding.

## 2018-11-03 NOTE — Telephone Encounter (Signed)
Patient requests Angela [name] call patient at ph# [phone] re: form and patient is sick to her stomach.

## 2018-11-03 NOTE — Telephone Encounter (Signed)
Pt called and informed of this.

## 2018-11-03 NOTE — Telephone Encounter (Signed)
Called pt and gave her MD message. Pt verbalized understanding. Pt stated that she would like the Rx for nausea just in case the symptoms get worse over the weekend.

## 2018-11-03 NOTE — Telephone Encounter (Signed)
Generic Zofran has been called in.  Also recommend that she take her generic Protonix regularly before breakfast also for now and refill has been called in

## 2018-11-03 NOTE — Telephone Encounter (Signed)
Please tell her she can start taking the Synthroid right before eating breakfast.  Also if she is taking Percocet for pain that can cause nausea and constipation and needs to try taking 1/2 tablet and less often.  If she needs medication for nausea we can call in a prescription.  Needs to maintain increased fluid intake also

## 2018-11-06 ENCOUNTER — Other Ambulatory Visit: Payer: Self-pay | Admitting: Endocrinology

## 2018-11-06 DIAGNOSIS — G894 Chronic pain syndrome: Secondary | ICD-10-CM | POA: Diagnosis not present

## 2018-11-06 DIAGNOSIS — M179 Osteoarthritis of knee, unspecified: Secondary | ICD-10-CM | POA: Diagnosis not present

## 2018-11-06 DIAGNOSIS — M1990 Unspecified osteoarthritis, unspecified site: Secondary | ICD-10-CM | POA: Diagnosis not present

## 2018-11-06 DIAGNOSIS — M5136 Other intervertebral disc degeneration, lumbar region: Secondary | ICD-10-CM | POA: Diagnosis not present

## 2018-11-07 DIAGNOSIS — M179 Osteoarthritis of knee, unspecified: Secondary | ICD-10-CM | POA: Diagnosis not present

## 2018-11-07 DIAGNOSIS — M1990 Unspecified osteoarthritis, unspecified site: Secondary | ICD-10-CM | POA: Diagnosis not present

## 2018-11-07 DIAGNOSIS — G894 Chronic pain syndrome: Secondary | ICD-10-CM | POA: Diagnosis not present

## 2018-11-07 DIAGNOSIS — M5136 Other intervertebral disc degeneration, lumbar region: Secondary | ICD-10-CM | POA: Diagnosis not present

## 2018-11-09 ENCOUNTER — Encounter: Payer: Self-pay | Admitting: Endocrinology

## 2018-11-15 ENCOUNTER — Telehealth: Payer: Self-pay | Admitting: Endocrinology

## 2018-11-15 ENCOUNTER — Other Ambulatory Visit: Payer: Self-pay | Admitting: Endocrinology

## 2018-11-15 MED ORDER — LOPERAMIDE HCL 2 MG PO TABS: 2.0000 mg | ORAL_TABLET | Freq: Three times a day (TID) | ORAL | 0 refills | Status: DC | PRN

## 2018-11-15 NOTE — Telephone Encounter (Signed)
Pt would like Immodium to be sent as an Rx so it will be delivered.

## 2018-11-15 NOTE — Telephone Encounter (Signed)
She was given Zofran for nausea and she can use this.  Also needs to take Imodium OTC as needed for diarrhea

## 2018-11-15 NOTE — Telephone Encounter (Signed)
Called pt and informed her of new Rx being sent.

## 2018-11-15 NOTE — Telephone Encounter (Signed)
Prescription sent

## 2018-11-15 NOTE — Telephone Encounter (Signed)
Patient is requesting a call back from [place]. Did not specify reason.  Please Advise, Thans

## 2018-11-15 NOTE — Telephone Encounter (Signed)
Pt states she is "sick on her stomach" and is experiencing loose stools. Pt denies presence of fever and stated that she is just experiencing general discomfort.

## 2018-11-16 ENCOUNTER — Other Ambulatory Visit: Payer: Self-pay | Admitting: Endocrinology

## 2018-11-16 NOTE — Telephone Encounter (Signed)
Ok to refill 

## 2018-11-16 NOTE — Telephone Encounter (Signed)
Okay to refill? 

## 2018-11-17 DIAGNOSIS — M170 Bilateral primary osteoarthritis of knee: Secondary | ICD-10-CM | POA: Diagnosis not present

## 2018-11-28 DIAGNOSIS — L821 Other seborrheic keratosis: Secondary | ICD-10-CM | POA: Diagnosis not present

## 2018-11-28 DIAGNOSIS — L718 Other rosacea: Secondary | ICD-10-CM | POA: Diagnosis not present

## 2018-11-28 DIAGNOSIS — Z85828 Personal history of other malignant neoplasm of skin: Secondary | ICD-10-CM | POA: Diagnosis not present

## 2018-11-28 DIAGNOSIS — L723 Sebaceous cyst: Secondary | ICD-10-CM | POA: Diagnosis not present

## 2018-11-28 DIAGNOSIS — D2271 Melanocytic nevi of right lower limb, including hip: Secondary | ICD-10-CM | POA: Diagnosis not present

## 2018-11-30 ENCOUNTER — Other Ambulatory Visit: Payer: Self-pay | Admitting: Endocrinology

## 2018-12-05 DIAGNOSIS — M5136 Other intervertebral disc degeneration, lumbar region: Secondary | ICD-10-CM | POA: Diagnosis not present

## 2018-12-05 DIAGNOSIS — M179 Osteoarthritis of knee, unspecified: Secondary | ICD-10-CM | POA: Diagnosis not present

## 2018-12-05 DIAGNOSIS — G894 Chronic pain syndrome: Secondary | ICD-10-CM | POA: Diagnosis not present

## 2018-12-05 DIAGNOSIS — M1990 Unspecified osteoarthritis, unspecified site: Secondary | ICD-10-CM | POA: Diagnosis not present

## 2018-12-07 DIAGNOSIS — I739 Peripheral vascular disease, unspecified: Secondary | ICD-10-CM | POA: Diagnosis not present

## 2018-12-07 DIAGNOSIS — L84 Corns and callosities: Secondary | ICD-10-CM | POA: Diagnosis not present

## 2018-12-07 DIAGNOSIS — L602 Onychogryphosis: Secondary | ICD-10-CM | POA: Diagnosis not present

## 2018-12-27 ENCOUNTER — Other Ambulatory Visit: Payer: Self-pay

## 2018-12-27 ENCOUNTER — Ambulatory Visit (INDEPENDENT_AMBULATORY_CARE_PROVIDER_SITE_OTHER): Payer: Medicare Other

## 2018-12-27 ENCOUNTER — Encounter: Payer: Self-pay | Admitting: Endocrinology

## 2018-12-27 DIAGNOSIS — Z23 Encounter for immunization: Secondary | ICD-10-CM | POA: Diagnosis not present

## 2019-01-01 ENCOUNTER — Other Ambulatory Visit: Payer: Self-pay | Admitting: Endocrinology

## 2019-01-02 ENCOUNTER — Other Ambulatory Visit: Payer: Self-pay | Admitting: *Deleted

## 2019-01-02 ENCOUNTER — Telehealth: Payer: Self-pay | Admitting: Oncology

## 2019-01-02 DIAGNOSIS — M179 Osteoarthritis of knee, unspecified: Secondary | ICD-10-CM | POA: Diagnosis not present

## 2019-01-02 DIAGNOSIS — M1990 Unspecified osteoarthritis, unspecified site: Secondary | ICD-10-CM | POA: Diagnosis not present

## 2019-01-02 DIAGNOSIS — M5136 Other intervertebral disc degeneration, lumbar region: Secondary | ICD-10-CM | POA: Diagnosis not present

## 2019-01-02 DIAGNOSIS — G894 Chronic pain syndrome: Secondary | ICD-10-CM | POA: Diagnosis not present

## 2019-01-02 DIAGNOSIS — D72829 Elevated white blood cell count, unspecified: Secondary | ICD-10-CM

## 2019-01-02 NOTE — Telephone Encounter (Signed)
Returned patient's phone call regarding rescheduling an appointment, per patient's request 10/14 has moved to 10/29.

## 2019-01-03 ENCOUNTER — Inpatient Hospital Stay: Payer: Medicare Other | Admitting: Oncology

## 2019-01-03 ENCOUNTER — Inpatient Hospital Stay: Payer: Medicare Other

## 2019-01-12 DIAGNOSIS — M170 Bilateral primary osteoarthritis of knee: Secondary | ICD-10-CM | POA: Diagnosis not present

## 2019-01-16 ENCOUNTER — Telehealth: Payer: Self-pay | Admitting: Oncology

## 2019-01-16 NOTE — Telephone Encounter (Signed)
Returned patient's phone call regarding rescheduling an appointment, per patient's request 10/29 has moved to 11/20.

## 2019-01-18 ENCOUNTER — Inpatient Hospital Stay: Payer: Medicare Other | Admitting: Oncology

## 2019-01-18 ENCOUNTER — Inpatient Hospital Stay: Payer: Medicare Other

## 2019-02-01 ENCOUNTER — Other Ambulatory Visit: Payer: Self-pay | Admitting: Endocrinology

## 2019-02-05 ENCOUNTER — Other Ambulatory Visit: Payer: Self-pay | Admitting: Endocrinology

## 2019-02-07 ENCOUNTER — Telehealth: Payer: Self-pay | Admitting: Oncology

## 2019-02-07 NOTE — Telephone Encounter (Signed)
Returned patient's phone call regarding rescheduling an appointment, per patient's request 11/20 appointment has moved to December.

## 2019-02-09 ENCOUNTER — Inpatient Hospital Stay: Payer: Medicare Other

## 2019-02-09 ENCOUNTER — Inpatient Hospital Stay: Payer: Medicare Other | Admitting: Oncology

## 2019-02-22 ENCOUNTER — Inpatient Hospital Stay: Payer: Medicare Other

## 2019-02-22 ENCOUNTER — Inpatient Hospital Stay: Payer: Medicare Other | Admitting: Oncology

## 2019-02-23 ENCOUNTER — Telehealth: Payer: Self-pay | Admitting: Oncology

## 2019-02-23 NOTE — Telephone Encounter (Signed)
Left message re January visit. Schedule mailed.

## 2019-02-27 DIAGNOSIS — M461 Sacroiliitis, not elsewhere classified: Secondary | ICD-10-CM | POA: Diagnosis not present

## 2019-02-27 DIAGNOSIS — M5136 Other intervertebral disc degeneration, lumbar region: Secondary | ICD-10-CM | POA: Diagnosis not present

## 2019-02-27 DIAGNOSIS — G894 Chronic pain syndrome: Secondary | ICD-10-CM | POA: Diagnosis not present

## 2019-02-27 DIAGNOSIS — M545 Low back pain: Secondary | ICD-10-CM | POA: Diagnosis not present

## 2019-02-28 DIAGNOSIS — I739 Peripheral vascular disease, unspecified: Secondary | ICD-10-CM | POA: Diagnosis not present

## 2019-02-28 DIAGNOSIS — L84 Corns and callosities: Secondary | ICD-10-CM | POA: Diagnosis not present

## 2019-02-28 DIAGNOSIS — L602 Onychogryphosis: Secondary | ICD-10-CM | POA: Diagnosis not present

## 2019-03-01 ENCOUNTER — Other Ambulatory Visit: Payer: Self-pay

## 2019-03-02 ENCOUNTER — Encounter: Payer: Self-pay | Admitting: Endocrinology

## 2019-03-02 ENCOUNTER — Ambulatory Visit (INDEPENDENT_AMBULATORY_CARE_PROVIDER_SITE_OTHER): Payer: Medicare Other | Admitting: Endocrinology

## 2019-03-02 VITALS — BP 136/74 | HR 74 | Ht 68.0 in

## 2019-03-02 DIAGNOSIS — R7303 Prediabetes: Secondary | ICD-10-CM | POA: Diagnosis not present

## 2019-03-02 DIAGNOSIS — E7800 Pure hypercholesterolemia, unspecified: Secondary | ICD-10-CM

## 2019-03-02 DIAGNOSIS — E063 Autoimmune thyroiditis: Secondary | ICD-10-CM

## 2019-03-02 DIAGNOSIS — I10 Essential (primary) hypertension: Secondary | ICD-10-CM | POA: Diagnosis not present

## 2019-03-02 DIAGNOSIS — E559 Vitamin D deficiency, unspecified: Secondary | ICD-10-CM | POA: Diagnosis not present

## 2019-03-02 LAB — COMPREHENSIVE METABOLIC PANEL
ALT: 16 U/L (ref 0–35)
AST: 19 U/L (ref 0–37)
Albumin: 3.6 g/dL (ref 3.5–5.2)
Alkaline Phosphatase: 110 U/L (ref 39–117)
BUN: 16 mg/dL (ref 6–23)
CO2: 28 mEq/L (ref 19–32)
Calcium: 8.7 mg/dL (ref 8.4–10.5)
Chloride: 100 mEq/L (ref 96–112)
Creatinine, Ser: 0.75 mg/dL (ref 0.40–1.20)
GFR: 73.49 mL/min (ref >60.00)
Glucose, Bld: 118 mg/dL — ABNORMAL HIGH (ref 70–99)
Potassium: 3.4 mEq/L — ABNORMAL LOW (ref 3.5–5.1)
Sodium: 138 mEq/L (ref 135–145)
Total Bilirubin: 0.8 mg/dL (ref 0.2–1.2)
Total Protein: 6.2 g/dL (ref 6.0–8.3)

## 2019-03-02 LAB — T4, FREE: Free T4: 1.24 ng/dL (ref 0.60–1.60)

## 2019-03-02 LAB — LIPID PANEL
Cholesterol: 206 mg/dL — ABNORMAL HIGH (ref 0–200)
HDL: 33.5 mg/dL — ABNORMAL LOW (ref >39.00)
LDL Cholesterol: 140 mg/dL — ABNORMAL HIGH (ref 0–99)
NonHDL: 172.83
Total CHOL/HDL Ratio: 6
Triglycerides: 164 mg/dL — ABNORMAL HIGH (ref 0.0–149.0)
VLDL: 32.8 mg/dL (ref 0.0–40.0)

## 2019-03-02 LAB — HEMOGLOBIN A1C: Hgb A1c MFr Bld: 5.7 % (ref 4.6–6.5)

## 2019-03-02 LAB — TSH: TSH: 1.99 u[IU]/mL (ref 0.35–4.50)

## 2019-03-02 LAB — VITAMIN D 25 HYDROXY (VIT D DEFICIENCY, FRACTURES): VITD: 42.39 ng/mL (ref 30.00–100.00)

## 2019-03-02 MED ORDER — RISEDRONATE SODIUM 35 MG PO TABS: 35.0000 mg | ORAL_TABLET | ORAL | 3 refills | Status: DC

## 2019-03-02 NOTE — Progress Notes (Signed)
Subjective:     Patient ID: Angela [name], female   DOB: Aug 13, 1932, 85 y.o.   MRN: [id number]  HPI  Reason for visit: Follow-up of various problems  Last seen in 8/20  Hypertension, She has had long-standing hypertension which has been well controlled .  Has been on Lotensin HCT 20/12.5 long-term without any dosage changes required  Does not monitor blood pressure by herself   BP Readings from Last 3 Encounters:  03/02/19 136/74  10/27/18 140/82  09/26/18 127/78    EDEMA: This has been very infrequent more recently  She has Lasix to take as needed   PREDIABETES: She has had long-standing impaired fasting glucose  However A1c has been consistently normal and around 6%  In the past had been treated with metformin Periodically will check home readings also in the morning  Most recent glucose was 121, nonfasting  Labs as follows    Lab Results  Component Value Date   HGBA1C 5.9 10/27/2018   HGBA1C 5.9 06/06/2018   HGBA1C 6.0 01/31/2018   Lab Results  Component Value Date   MICROALBUR 0.4 12/05/2012   LDLCALC 130 (H) 10/27/2018   CREATININE 0.60 10/27/2018    OTHER active problems are detailed in review of systems:   Allergies as of 03/02/2019      Reactions   Crestor [rosuvastatin Calcium] Anaphylaxis, Other (See Comments)   Abdominal discomfort   Gadolinium Nausea And Vomiting    pt states she had nausea after receiving MAGNEVIST for breast imaging   Pravastatin Other (See Comments)   Locked jaw, weakness   Simvastatin Other (See Comments)   Weakness and locked jaw.   Penicillins Swelling, Rash   Patient tolerates amoxicillin Has patient had a PCN reaction causing immediate rash, facial/tongue/throat swelling, SOB or lightheadedness with hypotension: yes Has patient had a PCN reaction causing severe rash involving mucus membranes or skin necrosis: unknown Has patient had a PCN reaction that required hospitalization: unknown Has  patient had a PCN reaction occurring within the last 10 years: no If all of the above answers are "NO", then may proceed with Cephalosporin use.      Medication List       Accurate as of March 02, 2019 10:51 AM. If you have any questions, ask your nurse or doctor.        albuterol 108 (90 Base) MCG/ACT inhaler Commonly known as: Ventolin HFA Inhale 2 puffs into the lungs 4 (four) times daily as needed for wheezing or shortness of breath.   benazepril-hydrochlorthiazide 20-12.5 MG tablet Commonly known as: LOTENSIN HCT TAKE 1 TABLET ONCE DAILY.   Eliquis 5 MG Tabs tablet Generic drug: apixaban TAKE 1 TABLET BY MOUTH TWICE DAILY.   furosemide 20 MG tablet Commonly known as: LASIX Take 0.5-1 tablets (10-20 mg total) by mouth daily as needed for fluid or edema (If weight gain by 2lbs in 24 hours).   ICAPS AREDS 2 PO Take 1 tablet by mouth daily.   isosorbide mononitrate 30 MG 24 hr tablet Commonly known as: IMDUR TAKE 1 TABLET ONCE DAILY.   loperamide 2 MG tablet Commonly known as: Imodium A-D Take 1 tablet (2 mg total) by mouth 3 (three) times daily as needed for diarrhea or loose stools.   LORazepam 0.5 MG tablet Commonly known as: ATIVAN TAKE (1) TABLET TWICE DAILY AS NEEDED FOR ANXIETY. What changed: See the new instructions.   ondansetron 4 MG tablet Commonly known as: ZOFRAN TAKE 1 TABLET EVERY 8  HOURS AS NEEDED FOR NAUSEA AND VOMITING.   pantoprazole 40 MG tablet Commonly known as: PROTONIX Take 1 tablet (40 mg total) by mouth daily.   Percocet 10-325 MG tablet Generic drug: oxyCODONE-acetaminophen Take 0.25-1 tablets by mouth every 6 (six) hours as needed for pain.   polyethylene glycol 17 g packet Commonly known as: MIRALAX / GLYCOLAX Take 17 g by mouth 3 (three) times daily as needed for moderate constipation.   PRESCRIPTION MEDICATION Inject 1 application as directed every 30 (thirty) days. Cortisone injections, each knee   Synthroid 125 MCG  tablet Generic drug: levothyroxine TAKE 1 TABLET IN THE MORNING ON AN EMPTY STOMACH.   Vitamin D (Ergocalciferol) 1.25 MG (50000 UT) Caps capsule Commonly known as: DRISDOL TAKE ONE CAPSULE ONCE A WEEK.   Voltaren 1 % Gel Generic drug: diclofenac Sodium Apply 2 g topically 4 (four) times daily as needed (pain). To knees       Review of Systems   OSTEOPOROSIS:  She had developed a vertebral fracture late 2018; she  also developed rib fractures in the mid 2018 X-ray report as follows: Vertebral augmentation at T12. A mild T7 compression deformity is new since June. A mild to moderate T9 compression deformity is not significantly changed. Bone density report not available from orthopedic surgeon  Previously had a course of calcitonin that was scribed in 01/2017 with relief of her pain  She is persistently reluctant to do Prolia injection despite repeated explanations and medication being approved by her insurance Today again discussed the need for treatment especially with her use of recurrent intra-articular steroids  She agrees to try oral bisphosphonates since her sister also took Actonel   Primary hypothyroidism: She has had long-standing primary hypothyroidism.   Has been taking 125 mcg thyroid since 06/2017,  She takes her levothyroxine before breakfast daily with water Usually has been regular with her regimen  TSH levels:   Lab Results  Component Value Date   TSH 0.56 10/27/2018   TSH 1.65 06/06/2018   TSH 1.02 01/31/2018   FREET4 1.43 10/27/2018   FREET4 1.29 06/06/2018   FREET4 1.34 01/31/2018    HYPERLIPIDEMIA: not on a statin drug because of patient's refusal to take medications for fear of having pains, cramps, hair loss or various other possible side effects  Her LDL has been between 130 and 160 usually without increased triglycerides  Lab Results  Component Value Date   CHOL 196 10/27/2018   CHOL 217 (H) 01/31/2018   CHOL 225 (H) 06/06/2017    Lab Results  Component Value Date   HDL 38.10 (L) 10/27/2018   HDL 48.70 01/31/2018   HDL 49.00 06/06/2017   Lab Results  Component Value Date   LDLCALC 130 (H) 10/27/2018   LDLCALC 144 (H) 01/31/2018   LDLCALC 156 (H) 06/06/2017   Lab Results  Component Value Date   TRIG 144.0 10/27/2018   TRIG 120.0 01/31/2018   TRIG 102.0 06/06/2017   Lab Results  Component Value Date   CHOLHDL 5 10/27/2018   CHOLHDL 4 01/31/2018   CHOLHDL 5 06/06/2017   Lab Results  Component Value Date   LDLDIRECT 193.5 02/07/2013   LDLDIRECT 189.5 12/11/2012   LDLDIRECT 201.3 12/05/2012      LEUKOCYTOSIS:  She has persistently high white blood cell count without abnormal morphology, white count may be much higher if she has an infection or following steroid injection  Periodically followed by hematologist White count has been variable  Lab Results  Component  Value Date   WBC 9.1 10/27/2018   WBC 12.6 (H) 07/21/2018   WBC 20.0 Repeated and verified X2. (HH) 06/06/2018   WBC 11.4 (H) 05/24/2018   She takes small doses of Percocet for knee pain, currently seeing orthopedic doctor for injection every other month  History of edema: Resolved Although she has a longstanding murmur this is likely to be aortic sclerosis since previous echocardiogram had not shown any obvious valvular abnormality      Objective:   Physical Exam  BP 136/74 (BP Location: Left Arm, Patient Position: Sitting, Cuff Size: Normal)   Pulse 74   Ht 5\' 8"  (1.727 m)   SpO2 95%   BMI 41.16 kg/m   Right knee has significant swelling No pedal edema present  Cardiac exam shows ejection 3/6 murmur on the left sternal border and base, rhythm regular Lungs clear     Assessment:      OSTEOPOROSIS: As discussed above she agrees to start taking Actonel Although she has had a history of reflux she is on continuous regimen with Protonix and should be able to try this on a weekly basis Discussed how the medication  will be taken, side effects, benefits and duration of treatment May need prior authorization for 35 mg risedronate  Continue vitamin D weekly as last level was in the 30s  HYPERTENSION: Well controlled consistently She is on benazepril HCTZ long-term She will continue the same regimen  Chemistry panel to be checked today   HYPOTHYROIDISM:  Has been taking 125 mcg levothyroxine fairly consistently and has been regular with this now She feels good Labs to be checked again  Hyperlipidemia: She has mild hypercholesterolemia, has refused statin drugs previously although not at high risk to start statins at current levels  History of prediabetes: This has been usually mild with A1c around 6% or below She refuses to have her weight checked again However she thinks that she is mostly watching her portions and carbohydrates A1c to be checked Also nonfasting glucose will be checked today  History of edema: Resolved Also has no dyspnea or signs of CHF, continues to have murmur on auscultation like to be from aortic sclerosis  History of pulmonary embolisms: She will continue Eliquis  Recommended establishing with a PCP for general care and preventive visit  History of leukocytosis: She will follow up with oncologist     Plan:     As above, labs to be checked today  Advised her to keep regular with annual eye exam  Recommended that she does not get cortisone injections in the knee more than every other month to avoid systemic effects, continue follow-up with orthopedic surgeon and use diclofenac gel as needed  To continue vitamin D supplementation     [name] 03/02/19  Total visit time for evaluation and management of multiple problems and counseling =25 minutes    Manvir Prabhu   Addendum: Potassium was low and this will be supplemented

## 2019-03-04 ENCOUNTER — Other Ambulatory Visit: Payer: Self-pay | Admitting: Endocrinology

## 2019-03-04 NOTE — Progress Notes (Signed)
Please call to let patient know that the lab results are normal except potassium is low, will need to start Klor-Con 10 mEq daily.  May refill Synthroid for 90 days

## 2019-03-05 ENCOUNTER — Other Ambulatory Visit: Payer: Self-pay

## 2019-03-05 ENCOUNTER — Telehealth: Payer: Self-pay

## 2019-03-05 MED ORDER — LEVOTHYROXINE SODIUM 125 MCG PO TABS: ORAL_TABLET | ORAL | 1 refills | Status: DC

## 2019-03-05 MED ORDER — ALENDRONATE SODIUM 70 MG PO TABS: 70.0000 mg | ORAL_TABLET | ORAL | 0 refills | Status: DC

## 2019-03-05 MED ORDER — POTASSIUM CHLORIDE ER 10 MEQ PO TBCR: 10.0000 meq | EXTENDED_RELEASE_TABLET | Freq: Every day | ORAL | 1 refills | Status: DC

## 2019-03-05 MED ORDER — BENAZEPRIL-HYDROCHLOROTHIAZIDE 20-12.5 MG PO TABS: 1.0000 | ORAL_TABLET | Freq: Every day | ORAL | 1 refills | Status: DC

## 2019-03-05 NOTE — Telephone Encounter (Signed)
Received fax from [employer] stating that Risedronate requires a PA. Would you like to do PA or change medication?

## 2019-03-05 NOTE — Telephone Encounter (Signed)
Called pt and gave her MD message. Pt verbalized understanding. 

## 2019-03-05 NOTE — Telephone Encounter (Signed)
Change to alendronate 70 mg weekly, please let patient know that insurance will not approve Actonel unless she has tried this

## 2019-03-21 ENCOUNTER — Telehealth: Payer: Self-pay | Admitting: Oncology

## 2019-03-21 NOTE — Telephone Encounter (Signed)
Returned patient's phone call regarding rescheduling 01/07 appointment, per patient' request appointment has moved to 01/18.

## 2019-03-27 DIAGNOSIS — G894 Chronic pain syndrome: Secondary | ICD-10-CM | POA: Diagnosis not present

## 2019-03-29 ENCOUNTER — Ambulatory Visit: Payer: Medicare Other | Admitting: Oncology

## 2019-03-29 ENCOUNTER — Other Ambulatory Visit: Payer: Medicare Other

## 2019-04-04 ENCOUNTER — Other Ambulatory Visit: Payer: Self-pay | Admitting: Endocrinology

## 2019-04-05 ENCOUNTER — Telehealth: Payer: Self-pay

## 2019-04-05 NOTE — Telephone Encounter (Signed)
Made in error

## 2019-04-05 NOTE — Telephone Encounter (Signed)
Patient called in stating that his nurse hours are still being cut short and she needs to speak with nurse to find out what happened or is going on    Please call and advise

## 2019-04-05 NOTE — Telephone Encounter (Signed)
Called pt and she stated that she has still not been awarded the 10 extra hours per week from her personal care assistant. Pt was informed that she should contact the agency that deals with awarding her these hours. If they need additional documentation, have them reach out to [place] for this. Pt verbalized understanding of this.

## 2019-04-10 ENCOUNTER — Inpatient Hospital Stay: Payer: Medicare Other | Attending: Oncology

## 2019-04-10 ENCOUNTER — Inpatient Hospital Stay (HOSPITAL_BASED_OUTPATIENT_CLINIC_OR_DEPARTMENT_OTHER): Payer: Medicare Other | Admitting: Oncology

## 2019-04-10 ENCOUNTER — Other Ambulatory Visit: Payer: Self-pay

## 2019-04-10 VITALS — BP 155/96 | HR 113 | Temp 97.8°F | Resp 17 | Ht 68.0 in

## 2019-04-10 DIAGNOSIS — E785 Hyperlipidemia, unspecified: Secondary | ICD-10-CM | POA: Diagnosis not present

## 2019-04-10 DIAGNOSIS — L988 Other specified disorders of the skin and subcutaneous tissue: Secondary | ICD-10-CM | POA: Diagnosis not present

## 2019-04-10 DIAGNOSIS — D72829 Elevated white blood cell count, unspecified: Secondary | ICD-10-CM

## 2019-04-10 DIAGNOSIS — Z79899 Other long term (current) drug therapy: Secondary | ICD-10-CM | POA: Insufficient documentation

## 2019-04-10 DIAGNOSIS — Z809 Family history of malignant neoplasm, unspecified: Secondary | ICD-10-CM | POA: Insufficient documentation

## 2019-04-10 DIAGNOSIS — Z1501 Genetic susceptibility to malignant neoplasm of breast: Secondary | ICD-10-CM | POA: Diagnosis not present

## 2019-04-10 DIAGNOSIS — M25561 Pain in right knee: Secondary | ICD-10-CM | POA: Insufficient documentation

## 2019-04-10 DIAGNOSIS — E039 Hypothyroidism, unspecified: Secondary | ICD-10-CM | POA: Insufficient documentation

## 2019-04-10 DIAGNOSIS — Z86711 Personal history of pulmonary embolism: Secondary | ICD-10-CM | POA: Diagnosis not present

## 2019-04-10 DIAGNOSIS — Z853 Personal history of malignant neoplasm of breast: Secondary | ICD-10-CM | POA: Insufficient documentation

## 2019-04-10 DIAGNOSIS — M25562 Pain in left knee: Secondary | ICD-10-CM | POA: Insufficient documentation

## 2019-04-10 LAB — CBC WITH DIFFERENTIAL (CANCER CENTER ONLY)
Abs Immature Granulocytes: 0.04 10*3/uL (ref 0.00–0.07)
Basophils Absolute: 0 10*3/uL (ref 0.0–0.1)
Basophils Relative: 0 %
Eosinophils Absolute: 0.2 10*3/uL (ref 0.0–0.5)
Eosinophils Relative: 2 %
HCT: 42.8 % (ref 36.0–46.0)
Hemoglobin: 14.4 g/dL (ref 12.0–15.0)
Immature Granulocytes: 0 %
Lymphocytes Relative: 40 %
Lymphs Abs: 4.1 10*3/uL — ABNORMAL HIGH (ref 0.7–4.0)
MCH: 30.3 pg (ref 26.0–34.0)
MCHC: 33.6 g/dL (ref 30.0–36.0)
MCV: 90.1 fL (ref 80.0–100.0)
Monocytes Absolute: 0.9 10*3/uL (ref 0.1–1.0)
Monocytes Relative: 9 %
Neutro Abs: 4.9 10*3/uL (ref 1.7–7.7)
Neutrophils Relative %: 49 %
Platelet Count: 242 10*3/uL (ref 150–400)
RBC: 4.75 MIL/uL (ref 3.87–5.11)
RDW: 13.2 % (ref 11.5–15.5)
WBC Count: 10.2 10*3/uL (ref 4.0–10.5)
nRBC: 0 % (ref 0.0–0.2)

## 2019-04-10 NOTE — Progress Notes (Signed)
  [place] OFFICE PROGRESS NOTE   Diagnosis: History of leukocytosis  INTERVAL HISTORY:   [name] returns as scheduled.  She feels well at present.  She complains of bilateral knee pain.  She last received a steroid injection approximately 1.5 months ago.  She has noted a fullness at the right lower back.  She was admitted in March 20 with altered mental status.  There was concern of sepsis and a possible dental source. She has not followed up with Dr. Nori Riis.  Objective:  Vital signs in last 24 hours:  Blood pressure (!) 155/96, pulse (!) 113, temperature 97.8 F (36.6 C), temperature source Temporal, resp. rate 17, height 5' 8" (1.727 m), SpO2 96 %.    HEENT: Neck without mass Lymphatics: No cervical, supraclavicular, or axillary nodes Resp: Lungs clear bilaterally Cardio: Regular rate and rhythm GI: No hepatosplenomegaly, nontender Vascular: No leg edema, chronic stasis change at the lower leg bilaterally Breast: Status post left mastectomy.  Bilateral breast implants.  Right breast without mass.  4 mm mobile cutaneous nodule at the left anterolateral chest wall. Skeletal: There is a 3-4 cm oval and mobile soft lesion at the right flank region.  There is associated tenderness.    Lab Results:  Lab Results  Component Value Date   WBC 10.2 04/10/2019   HGB 14.4 04/10/2019   HCT 42.8 04/10/2019   MCV 90.1 04/10/2019   PLT 242 04/10/2019   NEUTROABS 4.9 04/10/2019    CMP  Lab Results  Component Value Date   NA 138 03/02/2019   K 3.4 (L) 03/02/2019   CL 100 03/02/2019   CO2 28 03/02/2019   GLUCOSE 118 (H) 03/02/2019   BUN 16 03/02/2019   CREATININE 0.75 03/02/2019   CALCIUM 8.7 03/02/2019   PROT 6.2 03/02/2019   ALBUMIN 3.6 03/02/2019   AST 19 03/02/2019   ALT 16 03/02/2019   ALKPHOS 110 03/02/2019   BILITOT 0.8 03/02/2019   GFRNONAA >60 05/25/2018   GFRAA >60 05/25/2018     Medications: I have reviewed the patient's current  medications.   Assessment/Plan: 1. Leukocytosis-mild neutrophilia 2. Knee arthritis 3. BRCA2 carrier 4. Remote history of breast cancer 5. Hypothyroidism 6. Hyperlipidemia 7. Family history of multiple cancers 8. Bilateral pulmonary embolism June 2017-maintained on Apixaban    Disposition: [name] has a history of mild neutrophilia.  The white count is normal today.  The neutrophilia may be related to steroid injection therapy.  I have a low suspicion for a myeloproliferative disorder.  The soft lesion at the right back is likely a lipoma.  She will seek medical attention if this area enlarges or becomes painful.  She would like to continue follow-up at the cancer center.  She will return for an office visit in 1 year.  I encouraged her to follow-up with Dr. Nori Riis for a GYN exam.  She has decided against oophorectomy.  I encouraged her to obtain a COVID-19 vaccine.  Betsy Coder, MD  04/10/2019  10:50 AM

## 2019-04-11 ENCOUNTER — Telehealth: Payer: Self-pay | Admitting: Oncology

## 2019-04-11 NOTE — Telephone Encounter (Signed)
I talk with patient regarding schedule  

## 2019-04-20 ENCOUNTER — Ambulatory Visit: Payer: Medicare Other

## 2019-04-23 DIAGNOSIS — M170 Bilateral primary osteoarthritis of knee: Secondary | ICD-10-CM | POA: Diagnosis not present

## 2019-04-24 DIAGNOSIS — M461 Sacroiliitis, not elsewhere classified: Secondary | ICD-10-CM | POA: Diagnosis not present

## 2019-04-24 DIAGNOSIS — M545 Low back pain: Secondary | ICD-10-CM | POA: Diagnosis not present

## 2019-04-24 DIAGNOSIS — G894 Chronic pain syndrome: Secondary | ICD-10-CM | POA: Diagnosis not present

## 2019-04-24 DIAGNOSIS — M5136 Other intervertebral disc degeneration, lumbar region: Secondary | ICD-10-CM | POA: Diagnosis not present

## 2019-04-30 ENCOUNTER — Ambulatory Visit: Payer: Medicare Other | Attending: Internal Medicine

## 2019-04-30 DIAGNOSIS — Z23 Encounter for immunization: Secondary | ICD-10-CM | POA: Insufficient documentation

## 2019-04-30 NOTE — Progress Notes (Signed)
   Covid-19 Vaccination Clinic  Name:  Angela [name]    MRN: [id number] DOB: [birth year]  04/30/2019  [name] Angela Spark was observed post Covid-19 immunization for 30 minutes based on pre-vaccination screening without incidence. She was provided with Vaccine Information Sheet and instruction to access the V-Safe system.   [name] was instructed to call 911 with any severe reactions post vaccine: Marland Kitchen Difficulty breathing  . Swelling of your face and throat  . A fast heartbeat  . A bad rash all over your body  . Dizziness and weakness    Immunizations Administered    Name Date Dose VIS Date Route   Pfizer COVID-19 Vaccine 04/30/2019 10:53 AM 0.3 mL 03/02/2019 Intramuscular   Manufacturer: [place]   Lot: [id number]   [place]: [id number]

## 2019-05-03 ENCOUNTER — Other Ambulatory Visit: Payer: Self-pay | Admitting: Endocrinology

## 2019-05-07 ENCOUNTER — Ambulatory Visit: Payer: Medicare Other

## 2019-05-07 DIAGNOSIS — Z85828 Personal history of other malignant neoplasm of skin: Secondary | ICD-10-CM | POA: Diagnosis not present

## 2019-05-07 DIAGNOSIS — L821 Other seborrheic keratosis: Secondary | ICD-10-CM | POA: Diagnosis not present

## 2019-05-07 DIAGNOSIS — D171 Benign lipomatous neoplasm of skin and subcutaneous tissue of trunk: Secondary | ICD-10-CM | POA: Diagnosis not present

## 2019-05-07 DIAGNOSIS — L820 Inflamed seborrheic keratosis: Secondary | ICD-10-CM | POA: Diagnosis not present

## 2019-05-07 DIAGNOSIS — L578 Other skin changes due to chronic exposure to nonionizing radiation: Secondary | ICD-10-CM | POA: Diagnosis not present

## 2019-05-22 DIAGNOSIS — G894 Chronic pain syndrome: Secondary | ICD-10-CM | POA: Diagnosis not present

## 2019-05-22 DIAGNOSIS — M5136 Other intervertebral disc degeneration, lumbar region: Secondary | ICD-10-CM | POA: Diagnosis not present

## 2019-05-22 DIAGNOSIS — M545 Low back pain: Secondary | ICD-10-CM | POA: Diagnosis not present

## 2019-05-22 DIAGNOSIS — M461 Sacroiliitis, not elsewhere classified: Secondary | ICD-10-CM | POA: Diagnosis not present

## 2019-05-24 ENCOUNTER — Ambulatory Visit: Payer: Medicare Other | Attending: Internal Medicine

## 2019-05-24 DIAGNOSIS — Z23 Encounter for immunization: Secondary | ICD-10-CM | POA: Insufficient documentation

## 2019-05-24 NOTE — Progress Notes (Signed)
   Covid-19 Vaccination Clinic  Name:  Angela [name]    MRN: [id number] DOB: [birth year]  05/24/2019  [name] Angela Spark was observed post Covid-19 immunization for 15 minutes without incident. She was provided with Vaccine Information Sheet and instruction to access the V-Safe system.   [name] was instructed to call 911 with any severe reactions post vaccine: Marland Kitchen Difficulty breathing  . Swelling of face and throat  . A fast heartbeat  . A bad rash all over body  . Dizziness and weakness   Immunizations Administered    Name Date Dose VIS Date Route   Pfizer COVID-19 Vaccine 05/24/2019 10:35 AM 0.3 mL 03/02/2019 Intramuscular   Manufacturer: [place]   Lot: [id number]   [place]: [id number]

## 2019-05-30 ENCOUNTER — Other Ambulatory Visit: Payer: Self-pay | Admitting: Endocrinology

## 2019-05-30 DIAGNOSIS — L602 Onychogryphosis: Secondary | ICD-10-CM | POA: Diagnosis not present

## 2019-05-30 DIAGNOSIS — I739 Peripheral vascular disease, unspecified: Secondary | ICD-10-CM | POA: Diagnosis not present

## 2019-05-30 DIAGNOSIS — L84 Corns and callosities: Secondary | ICD-10-CM | POA: Diagnosis not present

## 2019-06-08 DIAGNOSIS — M170 Bilateral primary osteoarthritis of knee: Secondary | ICD-10-CM | POA: Diagnosis not present

## 2019-06-19 DIAGNOSIS — M179 Osteoarthritis of knee, unspecified: Secondary | ICD-10-CM | POA: Diagnosis not present

## 2019-06-19 DIAGNOSIS — G894 Chronic pain syndrome: Secondary | ICD-10-CM | POA: Diagnosis not present

## 2019-06-19 DIAGNOSIS — M1990 Unspecified osteoarthritis, unspecified site: Secondary | ICD-10-CM | POA: Diagnosis not present

## 2019-06-19 DIAGNOSIS — M5136 Other intervertebral disc degeneration, lumbar region: Secondary | ICD-10-CM | POA: Diagnosis not present

## 2019-06-29 ENCOUNTER — Telehealth: Payer: Self-pay

## 2019-06-29 ENCOUNTER — Other Ambulatory Visit: Payer: Self-pay

## 2019-06-29 NOTE — Telephone Encounter (Signed)
Spoke with the patient today and she is requesting an Rx for a new Zachory Mangual that is higher up

## 2019-07-01 NOTE — Telephone Encounter (Signed)
The information is incomplete.  She needs to find out from her DME supplier what specifically she needs.  Also need to know why she needs it.  Also reminded to establish with a PCP since she will not be seen here after June for primary care

## 2019-07-02 ENCOUNTER — Other Ambulatory Visit: Payer: Self-pay | Admitting: Endocrinology

## 2019-07-02 NOTE — Telephone Encounter (Signed)
Called pt and gave her MD message. Pt verbalized understanding and stated that she would find out exactly what she is needing and let this office know.

## 2019-07-03 ENCOUNTER — Other Ambulatory Visit: Payer: Self-pay

## 2019-07-03 ENCOUNTER — Ambulatory Visit (INDEPENDENT_AMBULATORY_CARE_PROVIDER_SITE_OTHER): Payer: Medicare Other | Admitting: Endocrinology

## 2019-07-03 ENCOUNTER — Encounter: Payer: Self-pay | Admitting: Endocrinology

## 2019-07-03 VITALS — BP 120/74 | HR 96 | Ht 68.0 in

## 2019-07-03 DIAGNOSIS — M810 Age-related osteoporosis without current pathological fracture: Secondary | ICD-10-CM | POA: Diagnosis not present

## 2019-07-03 DIAGNOSIS — I10 Essential (primary) hypertension: Secondary | ICD-10-CM

## 2019-07-03 DIAGNOSIS — E063 Autoimmune thyroiditis: Secondary | ICD-10-CM | POA: Diagnosis not present

## 2019-07-03 DIAGNOSIS — E7800 Pure hypercholesterolemia, unspecified: Secondary | ICD-10-CM | POA: Diagnosis not present

## 2019-07-03 DIAGNOSIS — R7303 Prediabetes: Secondary | ICD-10-CM

## 2019-07-03 LAB — T4, FREE: Free T4: 1.54 ng/dL (ref 0.60–1.60)

## 2019-07-03 LAB — COMPREHENSIVE METABOLIC PANEL
ALT: 13 U/L (ref 0–35)
AST: 14 U/L (ref 0–37)
Albumin: 3.7 g/dL (ref 3.5–5.2)
Alkaline Phosphatase: 120 U/L — ABNORMAL HIGH (ref 39–117)
BUN: 12 mg/dL (ref 6–23)
CO2: 28 mEq/L (ref 19–32)
Calcium: 8.7 mg/dL (ref 8.4–10.5)
Chloride: 100 mEq/L (ref 96–112)
Creatinine, Ser: 0.54 mg/dL (ref 0.40–1.20)
GFR: 107.28 mL/min (ref >60.00)
Glucose, Bld: 136 mg/dL — ABNORMAL HIGH (ref 70–99)
Potassium: 3.6 mEq/L (ref 3.5–5.1)
Sodium: 138 mEq/L (ref 135–145)
Total Bilirubin: 0.8 mg/dL (ref 0.2–1.2)
Total Protein: 6.3 g/dL (ref 6.0–8.3)

## 2019-07-03 LAB — CBC WITH DIFFERENTIAL/PLATELET
Basophils Absolute: 0.1 10*3/uL (ref 0.0–0.1)
Basophils Relative: 1.3 % (ref 0.0–3.0)
Eosinophils Absolute: 0.2 10*3/uL (ref 0.0–0.7)
Eosinophils Relative: 1.9 % (ref 0.0–5.0)
HCT: 42.5 % (ref 36.0–46.0)
Hemoglobin: 14.6 g/dL (ref 12.0–15.0)
Lymphocytes Relative: 30.1 % (ref 12.0–46.0)
Lymphs Abs: 3.2 10*3/uL (ref 0.7–4.0)
MCHC: 34.4 g/dL (ref 30.0–36.0)
MCV: 90 fl (ref 78.0–100.0)
Monocytes Absolute: 0.7 10*3/uL (ref 0.1–1.0)
Monocytes Relative: 7 % (ref 3.0–12.0)
Neutro Abs: 6.3 10*3/uL (ref 1.4–7.7)
Neutrophils Relative %: 59.7 % (ref 43.0–77.0)
Platelets: 189 10*3/uL (ref 150.0–400.0)
RBC: 4.72 Mil/uL (ref 3.87–5.11)
RDW: 13.4 % (ref 11.5–15.5)
WBC: 10.5 10*3/uL (ref 4.0–10.5)

## 2019-07-03 LAB — LIPID PANEL
Cholesterol: 217 mg/dL — ABNORMAL HIGH (ref 0–200)
HDL: 44.2 mg/dL (ref >39.00)
LDL Cholesterol: 152 mg/dL — ABNORMAL HIGH (ref 0–99)
NonHDL: 172.34
Total CHOL/HDL Ratio: 5
Triglycerides: 102 mg/dL (ref 0.0–149.0)
VLDL: 20.4 mg/dL (ref 0.0–40.0)

## 2019-07-03 LAB — HEMOGLOBIN A1C: Hgb A1c MFr Bld: 5.7 % (ref 4.6–6.5)

## 2019-07-03 LAB — TSH: TSH: 0.32 u[IU]/mL — ABNORMAL LOW (ref 0.35–4.50)

## 2019-07-03 LAB — VITAMIN D 25 HYDROXY (VIT D DEFICIENCY, FRACTURES): VITD: 45.95 ng/mL (ref 30.00–100.00)

## 2019-07-03 NOTE — Progress Notes (Signed)
Subjective:     Patient ID: Angela [name], female   DOB: 07-02-1932, 86 y.o.   MRN: [id number]  HPI  Reason for visit: Follow-up of various problems   Hypertension, She has had long-standing hypertension which has been well controlled .  Has been on Lotensin HCT 20/12.5 long-term without any adjustment for some time  Does not monitor blood pressure at home   BP Readings from Last 3 Encounters:  07/03/19 120/74  04/10/19 (!) 155/96  03/02/19 136/74    EDEMA: This has been very infrequent and only will take Lasix a couple of times a month, usually not full 20 mg Her potassium however was low on the last visit   PREDIABETES: She has had long-standing impaired fasting glucose  A1c has been consistently normal and below 6% more recently  In the past had been treated with metformin  She will check home readings also in the morning, usually below 100 and highest 101  Most recent glucose was 118, on her last visit She is not fasting today for labs  Labs as follows    Lab Results  Component Value Date   HGBA1C 5.7 03/02/2019   HGBA1C 5.9 10/27/2018   HGBA1C 5.9 06/06/2018   Lab Results  Component Value Date   MICROALBUR 0.4 12/05/2012   LDLCALC 140 (H) 03/02/2019   CREATININE 0.75 03/02/2019    OTHER active problems are detailed in review of systems:   Allergies as of 07/03/2019      Reactions   Crestor [rosuvastatin Calcium] Anaphylaxis, Other (See Comments)   Abdominal discomfort   Gadolinium Nausea And Vomiting    pt states she had nausea after receiving MAGNEVIST for breast imaging   Pravastatin Other (See Comments)   Locked jaw, weakness   Simvastatin Other (See Comments)   Weakness and locked jaw.   Penicillins Swelling, Rash   Patient tolerates amoxicillin Has patient had a PCN reaction causing immediate rash, facial/tongue/throat swelling, SOB or lightheadedness with hypotension: yes Has patient had a PCN reaction causing severe rash  involving mucus membranes or skin necrosis: unknown Has patient had a PCN reaction that required hospitalization: unknown Has patient had a PCN reaction occurring within the last 10 years: no If all of the above answers are "NO", then may proceed with Cephalosporin use.      Medication List       Accurate as of July 03, 2019 10:47 AM. If you have any questions, ask your nurse or doctor.        albuterol 108 (90 Base) MCG/ACT inhaler Commonly known as: Ventolin HFA Inhale 2 puffs into the lungs 4 (four) times daily as needed for wheezing or shortness of breath.   alendronate 70 MG tablet Commonly known as: FOSAMAX TAKE 1 TAB ONCE A WEEK, AT LEAST 30 MIN BEFORE 1ST FOOD.DO NOT LIE DOWN FOR 30 MIN AFTER TAKING.   benazepril-hydrochlorthiazide 20-12.5 MG tablet Commonly known as: LOTENSIN HCT Take 1 tablet by mouth daily.   Eliquis 5 MG Tabs tablet Generic drug: apixaban TAKE 1 TABLET BY MOUTH TWICE DAILY.   furosemide 20 MG tablet Commonly known as: LASIX Take 0.5-1 tablets (10-20 mg total) by mouth daily as needed for fluid or edema (If weight gain by 2lbs in 24 hours).   ICAPS AREDS 2 PO Take 1 tablet by mouth daily.   isosorbide mononitrate 30 MG 24 hr tablet Commonly known as: IMDUR TAKE 1 TABLET ONCE DAILY.   levothyroxine 125 MCG tablet Commonly  known as: Synthroid TAKE 1 TABLET IN THE MORNING ON AN EMPTY STOMACH.   loperamide 2 MG tablet Commonly known as: Imodium A-D Take 1 tablet (2 mg total) by mouth 3 (three) times daily as needed for diarrhea or loose stools.   LORazepam 0.5 MG tablet Commonly known as: ATIVAN TAKE (1) TABLET TWICE DAILY AS NEEDED FOR ANXIETY. What changed: See the new instructions.   ondansetron 4 MG tablet Commonly known as: ZOFRAN TAKE 1 TABLET EVERY 8 HOURS AS NEEDED FOR NAUSEA AND VOMITING.   pantoprazole 40 MG tablet Commonly known as: PROTONIX TAKE 1 TABLET ONCE DAILY.   Percocet 10-325 MG tablet Generic drug:  oxyCODONE-acetaminophen Take 0.25-1 tablets by mouth every 6 (six) hours as needed for pain.   polyethylene glycol 17 g packet Commonly known as: MIRALAX / GLYCOLAX Take 17 g by mouth 3 (three) times daily as needed for moderate constipation.   potassium chloride 10 MEQ tablet Commonly known as: KLOR-CON Take 1 tablet (10 mEq total) by mouth daily.   PRESCRIPTION MEDICATION Inject 1 application as directed every 30 (thirty) days. Cortisone injections, each knee   Vitamin D (Ergocalciferol) 1.25 MG (50000 UNIT) Caps capsule Commonly known as: DRISDOL TAKE ONE CAPSULE ONCE A WEEK.   Voltaren 1 % Gel Generic drug: diclofenac Sodium Apply 2 g topically 4 (four) times daily as needed (pain). To knees       Review of Systems   OSTEOPOROSIS:  She had developed a vertebral fracture late 2018; she  also developed rib fractures in the mid 2018 X-ray report as follows: Vertebral augmentation at T12. A mild T7 compression deformity is new since June. A mild to moderate T9 compression deformity is not significantly changed. Bone density report not available from orthopedic surgeon  Previously had a course of calcitonin that was scribed in 01/2017 with relief of her pain  She is persistently reluctant to do Prolia injection despite repeated explanations and medication being approved by her insurance Today again discussed the need for treatment especially with her use of recurrent intra-articular steroids  She did start taking Fosamax after her visit in 12/20, this is preferred over Actonel by her insurance No side effects currently and she is regular with this   Primary hypothyroidism: She has had long-standing primary hypothyroidism.   Has been taking 125 mcg thyroid since 06/2017, usually TSH is normal with this  She takes her levothyroxine before breakfast daily with water Has not missed any doses  TSH levels:   Lab Results  Component Value Date   TSH 1.99 03/02/2019    TSH 0.56 10/27/2018   TSH 1.65 06/06/2018   FREET4 1.24 03/02/2019   FREET4 1.43 10/27/2018   FREET4 1.29 06/06/2018    HYPERLIPIDEMIA: not on a statin drug because of patient's refusal to take medications for fear of having pains, cramps, hair loss or various other possible side effects  Her LDL has been between 130 and 160 usually She is not fasting for labs today  Lab Results  Component Value Date   CHOL 206 (H) 03/02/2019   CHOL 196 10/27/2018   CHOL 217 (H) 01/31/2018   Lab Results  Component Value Date   HDL 33.50 (L) 03/02/2019   HDL 38.10 (L) 10/27/2018   HDL 48.70 01/31/2018   Lab Results  Component Value Date   LDLCALC 140 (H) 03/02/2019   LDLCALC 130 (H) 10/27/2018   LDLCALC 144 (H) 01/31/2018   Lab Results  Component Value Date   TRIG 164.0 (  H) 03/02/2019   TRIG 144.0 10/27/2018   TRIG 120.0 01/31/2018   Lab Results  Component Value Date   CHOLHDL 6 03/02/2019   CHOLHDL 5 10/27/2018   CHOLHDL 4 01/31/2018   Lab Results  Component Value Date   LDLDIRECT 193.5 02/07/2013   LDLDIRECT 189.5 12/11/2012   LDLDIRECT 201.3 12/05/2012      LEUKOCYTOSIS:  She has persistently high white blood cell count without abnormal morphology, white count recently has been more normal, previously may have been higher following steroid injection  Periodically followed by hematologist  Lab Results  Component Value Date   WBC 10.2 04/10/2019   WBC 9.1 10/27/2018   WBC 12.6 (H) 07/21/2018   WBC 20.0 Repeated and verified X2. ([place]) 06/06/2018   She takes small doses of Percocet for knee pain, seeing orthopedic doctors regularly  No history of shortness of breath:  She has a longstanding cardiac murmur this is likely to be aortic sclerosis since previous echocardiogram had not shown any obvious valvular abnormality      Objective:   Physical Exam  BP 120/74 (BP Location: Right Arm, Patient Position: Sitting, Cuff Size: Large)   Pulse 96   Ht 5\' 8"  (1.727 m)    SpO2 98%   BMI 41.16 kg/m   Thyroid not palpable Biceps reflexes show normal relaxation No pedal edema present        Assessment:      OSTEOPOROSIS:  She is on Fosamax without side effects Also continues to be on Protonix She will continue this up to 5 units  Also on vitamin D weekly, last level was in the 30s  HYPERTENSION: Well controlled  She is on benazepril HCTZ long-term She will continue the same dosage  Chemistry panel to be checked today   HYPOTHYROIDISM:  Has been taking 125 mcg levothyroxine fairly consistently and has been regular with this every morning Thyroid levels to be checked today  Hyperlipidemia: She has mild hypercholesterolemia, has refused statin drugs previously although not at high risk to start statin We will recheck labs today and discuss results  History of prediabetes: This has been usually mild with A1c around 6% or below She refuses to have her weight checked  A1c and glucose will be checked today  History of edema: Resolved Also has no dyspnea or signs of CHF, history of cardiac systolic murmur on auscultation like to be from aortic sclerosis  History of pulmonary embolisms: She will continue Eliquis  Osteoarthritis of knees: She is continuing to follow-up with orthopedic specialist  History of leukocytosis: Appears to be resolved     Plan:     As above, labs will be discussed when available  She is going to call to schedule with a new PCP She can come back here as needed especially if there is any endocrine issues      [name] 07/03/19

## 2019-07-06 ENCOUNTER — Other Ambulatory Visit: Payer: Self-pay

## 2019-07-06 DIAGNOSIS — E063 Autoimmune thyroiditis: Secondary | ICD-10-CM

## 2019-07-06 MED ORDER — LEVOTHYROXINE SODIUM 112 MCG PO TABS: 112.0000 ug | ORAL_TABLET | Freq: Every day | ORAL | 1 refills | Status: DC

## 2019-07-06 NOTE — Progress Notes (Signed)
Thyroid level is a little high.  Need to reduce the dose from 125 levothyroxine to 112 mcg.  Also cholesterol is slightly higher than before.  To pay attention to dairy and animal fats.  White cells are okay as also vitamin D.  Schedule follow-up in 8 weeks with same day labs

## 2019-07-11 ENCOUNTER — Other Ambulatory Visit: Payer: Self-pay | Admitting: Endocrinology

## 2019-07-19 DIAGNOSIS — M170 Bilateral primary osteoarthritis of knee: Secondary | ICD-10-CM | POA: Diagnosis not present

## 2019-07-30 ENCOUNTER — Other Ambulatory Visit: Payer: Self-pay | Admitting: Endocrinology

## 2019-08-08 DIAGNOSIS — L84 Corns and callosities: Secondary | ICD-10-CM | POA: Diagnosis not present

## 2019-08-08 DIAGNOSIS — I739 Peripheral vascular disease, unspecified: Secondary | ICD-10-CM | POA: Diagnosis not present

## 2019-08-14 DIAGNOSIS — M545 Low back pain: Secondary | ICD-10-CM | POA: Diagnosis not present

## 2019-08-14 DIAGNOSIS — M1990 Unspecified osteoarthritis, unspecified site: Secondary | ICD-10-CM | POA: Diagnosis not present

## 2019-08-14 DIAGNOSIS — G894 Chronic pain syndrome: Secondary | ICD-10-CM | POA: Diagnosis not present

## 2019-08-14 DIAGNOSIS — M179 Osteoarthritis of knee, unspecified: Secondary | ICD-10-CM | POA: Diagnosis not present

## 2019-08-16 DIAGNOSIS — L821 Other seborrheic keratosis: Secondary | ICD-10-CM | POA: Diagnosis not present

## 2019-08-16 DIAGNOSIS — Z85828 Personal history of other malignant neoplasm of skin: Secondary | ICD-10-CM | POA: Diagnosis not present

## 2019-08-16 DIAGNOSIS — L718 Other rosacea: Secondary | ICD-10-CM | POA: Diagnosis not present

## 2019-08-16 DIAGNOSIS — L720 Epidermal cyst: Secondary | ICD-10-CM | POA: Diagnosis not present

## 2019-08-16 DIAGNOSIS — L820 Inflamed seborrheic keratosis: Secondary | ICD-10-CM | POA: Diagnosis not present

## 2019-08-16 DIAGNOSIS — L738 Other specified follicular disorders: Secondary | ICD-10-CM | POA: Diagnosis not present

## 2019-08-17 ENCOUNTER — Other Ambulatory Visit: Payer: Self-pay

## 2019-08-17 ENCOUNTER — Other Ambulatory Visit (HOSPITAL_COMMUNITY): Payer: Self-pay | Admitting: Orthopaedic Surgery

## 2019-08-17 ENCOUNTER — Other Ambulatory Visit: Payer: Self-pay | Admitting: Endocrinology

## 2019-08-17 ENCOUNTER — Ambulatory Visit (HOSPITAL_COMMUNITY): Payer: Medicare Other

## 2019-08-17 ENCOUNTER — Ambulatory Visit (HOSPITAL_COMMUNITY)
Admission: RE | Admit: 2019-08-17 | Discharge: 2019-08-17 | Disposition: A | Discharge status: Home / Self Care | Payer: Medicare Other | Source: Ambulatory Visit | Attending: Orthopaedic Surgery | Admitting: Orthopaedic Surgery

## 2019-08-17 DIAGNOSIS — M7989 Other specified soft tissue disorders: Secondary | ICD-10-CM | POA: Diagnosis not present

## 2019-08-17 DIAGNOSIS — M79604 Pain in right leg: Secondary | ICD-10-CM

## 2019-08-17 NOTE — Progress Notes (Signed)
Right lower extremity venous duplex has been completed. Preliminary results can be found in CV Proc through chart review.  Results were given to [facility] at Dr. Rich Fuchs office.  08/17/19 12:26 PM Carlos Levering RVT

## 2019-08-28 DIAGNOSIS — H353132 Nonexudative age-related macular degeneration, bilateral, intermediate dry stage: Secondary | ICD-10-CM | POA: Diagnosis not present

## 2019-08-28 DIAGNOSIS — H52203 Unspecified astigmatism, bilateral: Secondary | ICD-10-CM | POA: Diagnosis not present

## 2019-08-28 DIAGNOSIS — Z961 Presence of intraocular lens: Secondary | ICD-10-CM | POA: Diagnosis not present

## 2019-08-30 ENCOUNTER — Other Ambulatory Visit: Payer: Self-pay | Admitting: Endocrinology

## 2019-08-30 NOTE — Telephone Encounter (Signed)
Only one time.  We will discuss her getting the PCP on upcoming visit

## 2019-08-30 NOTE — Telephone Encounter (Signed)
Noted  

## 2019-08-30 NOTE — Telephone Encounter (Signed)
Would you like to refill these?

## 2019-09-04 ENCOUNTER — Encounter: Payer: Self-pay | Admitting: Endocrinology

## 2019-09-04 ENCOUNTER — Ambulatory Visit (INDEPENDENT_AMBULATORY_CARE_PROVIDER_SITE_OTHER): Payer: Medicare Other | Admitting: Endocrinology

## 2019-09-04 ENCOUNTER — Other Ambulatory Visit: Payer: Self-pay

## 2019-09-04 VITALS — BP 140/80 | HR 84 | Ht 68.0 in

## 2019-09-04 DIAGNOSIS — D72829 Elevated white blood cell count, unspecified: Secondary | ICD-10-CM

## 2019-09-04 DIAGNOSIS — D1720 Benign lipomatous neoplasm of skin and subcutaneous tissue of unspecified limb: Secondary | ICD-10-CM | POA: Diagnosis not present

## 2019-09-04 DIAGNOSIS — I10 Essential (primary) hypertension: Secondary | ICD-10-CM | POA: Diagnosis not present

## 2019-09-04 DIAGNOSIS — E063 Autoimmune thyroiditis: Secondary | ICD-10-CM | POA: Diagnosis not present

## 2019-09-04 LAB — BASIC METABOLIC PANEL
BUN: 19 mg/dL (ref 6–23)
CO2: 29 mEq/L (ref 19–32)
Calcium: 8.9 mg/dL (ref 8.4–10.5)
Chloride: 100 mEq/L (ref 96–112)
Creatinine, Ser: 0.65 mg/dL (ref 0.40–1.20)
GFR: 86.58 mL/min (ref >60.00)
Glucose, Bld: 103 mg/dL — ABNORMAL HIGH (ref 70–99)
Potassium: 4 mEq/L (ref 3.5–5.1)
Sodium: 137 mEq/L (ref 135–145)

## 2019-09-04 LAB — CBC WITH DIFFERENTIAL/PLATELET
Basophils Absolute: 0.1 10*3/uL (ref 0.0–0.1)
Basophils Relative: 0.7 % (ref 0.0–3.0)
Eosinophils Absolute: 0 10*3/uL (ref 0.0–0.7)
Eosinophils Relative: 0.3 % (ref 0.0–5.0)
HCT: 42.9 % (ref 36.0–46.0)
Hemoglobin: 14.4 g/dL (ref 12.0–15.0)
Lymphocytes Relative: 18.6 % (ref 12.0–46.0)
Lymphs Abs: 2.7 10*3/uL (ref 0.7–4.0)
MCHC: 33.6 g/dL (ref 30.0–36.0)
MCV: 90.3 fl (ref 78.0–100.0)
Monocytes Absolute: 1.1 10*3/uL — ABNORMAL HIGH (ref 0.1–1.0)
Monocytes Relative: 7.4 % (ref 3.0–12.0)
Neutro Abs: 10.5 10*3/uL — ABNORMAL HIGH (ref 1.4–7.7)
Neutrophils Relative %: 73 % (ref 43.0–77.0)
Platelets: 250 10*3/uL (ref 150.0–400.0)
RBC: 4.75 Mil/uL (ref 3.87–5.11)
RDW: 13.2 % (ref 11.5–15.5)
WBC: 14.3 10*3/uL — ABNORMAL HIGH (ref 4.0–10.5)

## 2019-09-04 LAB — T4, FREE: Free T4: 1.59 ng/dL (ref 0.60–1.60)

## 2019-09-04 LAB — TSH: TSH: 0.11 u[IU]/mL — ABNORMAL LOW (ref 0.35–4.50)

## 2019-09-04 NOTE — Progress Notes (Signed)
Subjective:     Patient ID: Angela [name], female   DOB: 1933/01/17, 86 y.o.   MRN: [id number]  HPI  Reason for visit: Follow-up of hypothyroidism and other ongoingproblems   Hypertension, She has had long-standing hypertension which has been consistently controlled .   Has been on Lotensin HCT 20/12.5 long-term without any adjustment in dosage for some time  Does not monitor blood pressure at home  She also has Lasix to use as needed, 20 mg  BP Readings from Last 3 Encounters:  09/04/19 140/80  07/03/19 120/74  04/10/19 (!) 155/96     PREDIABETES: She has had long-standing impaired fasting glucose  A1c has been consistently normal and below 6% for some time  In the past had been treated with metformin  She will check home readings occasionally fasting and is a lot high  Most recent glucose was 136 nonfasting, on her last visit  Labs as follows    Lab Results  Component Value Date   HGBA1C 5.7 07/03/2019   HGBA1C 5.7 03/02/2019   HGBA1C 5.9 10/27/2018   Lab Results  Component Value Date   MICROALBUR 0.4 12/05/2012   LDLCALC 152 (H) 07/03/2019   CREATININE 0.54 07/03/2019    OTHER active problems including hypothyroidism are detailed in review of systems:   Allergies as of 09/04/2019      Reactions   Crestor [rosuvastatin Calcium] Anaphylaxis, Other (See Comments)   Abdominal discomfort   Gadolinium Nausea And Vomiting    pt states she had nausea after receiving MAGNEVIST for breast imaging   Pravastatin Other (See Comments)   Locked jaw, weakness   Simvastatin Other (See Comments)   Weakness and locked jaw.   Penicillins Swelling, Rash   Patient tolerates amoxicillin Has patient had a PCN reaction causing immediate rash, facial/tongue/throat swelling, SOB or lightheadedness with hypotension: yes Has patient had a PCN reaction causing severe rash involving mucus membranes or skin necrosis: unknown Has patient had a PCN reaction that  required hospitalization: unknown Has patient had a PCN reaction occurring within the last 10 years: no If all of the above answers are "NO", then may proceed with Cephalosporin use.      Medication List       Accurate as of September 04, 2019 11:16 AM. If you have any questions, ask your nurse or doctor.        albuterol 108 (90 Base) MCG/ACT inhaler Commonly known as: Ventolin HFA Inhale 2 puffs into the lungs 4 (four) times daily as needed for wheezing or shortness of breath.   alendronate 70 MG tablet Commonly known as: FOSAMAX TAKE 1 TAB ONCE A WEEK, AT LEAST 30 MIN BEFORE 1ST FOOD.DO NOT LIE DOWN FOR 30 MIN AFTER TAKING.   benazepril-hydrochlorthiazide 20-12.5 MG tablet Commonly known as: LOTENSIN HCT TAKE 1 TABLET ONCE DAILY.   Eliquis 5 MG Tabs tablet Generic drug: apixaban TAKE 1 TABLET BY MOUTH TWICE DAILY.   furosemide 20 MG tablet Commonly known as: LASIX Take 0.5-1 tablets (10-20 mg total) by mouth daily as needed for fluid or edema (If weight gain by 2lbs in 24 hours).   ICAPS AREDS 2 PO Take 1 tablet by mouth daily.   isosorbide mononitrate 30 MG 24 hr tablet Commonly known as: IMDUR TAKE 1 TABLET ONCE DAILY.   levothyroxine 112 MCG tablet Commonly known as: SYNTHROID Take 1 tablet (112 mcg total) by mouth daily.   loperamide 2 MG tablet Commonly known as: Imodium A-D Take  1 tablet (2 mg total) by mouth 3 (three) times daily as needed for diarrhea or loose stools.   LORazepam 0.5 MG tablet Commonly known as: ATIVAN TAKE (1) TABLET TWICE DAILY AS NEEDED FOR ANXIETY. What changed: See the new instructions.   ondansetron 4 MG tablet Commonly known as: ZOFRAN TAKE 1 TABLET EVERY 8 HOURS AS NEEDED FOR NAUSEA AND VOMITING.   pantoprazole 40 MG tablet Commonly known as: PROTONIX TAKE 1 TABLET ONCE DAILY.   Percocet 10-325 MG tablet Generic drug: oxyCODONE-acetaminophen Take 0.25-1 tablets by mouth every 6 (six) hours as needed for pain.     polyethylene glycol 17 g packet Commonly known as: MIRALAX / GLYCOLAX Take 17 g by mouth 3 (three) times daily as needed for moderate constipation.   polyethylene glycol powder 17 GM/SCOOP powder Commonly known as: GLYCOLAX/MIRALAX DISSOLVE ONE CAPFUL IN LIQUID AND DRINK 3 TIMES A DAY AS NEEDED FOR MODERATE CONSTIPATION.   potassium chloride 10 MEQ tablet Commonly known as: KLOR-CON TAKE 1 TABLET ONCE DAILY.   PRESCRIPTION MEDICATION Inject 1 application as directed every 30 (thirty) days. Cortisone injections, each knee   Vitamin D (Ergocalciferol) 1.25 MG (50000 UNIT) Caps capsule Commonly known as: DRISDOL TAKE ONE CAPSULE ONCE A WEEK.   Voltaren 1 % Gel Generic drug: diclofenac Sodium Apply 2 g topically 4 (four) times daily as needed (pain). To knees       Review of Systems   OSTEOPOROSIS:  She had developed a vertebral fracture late 2018; she  also developed rib fractures in the mid 2018 X-ray report as follows: Vertebral augmentation at T12. A mild T7 compression deformity is new since June. A mild to moderate T9 compression deformity is not significantly changed. Bone density report not available from orthopedic surgeon  Previously had a course of calcitonin that was scribed in 01/2017 with relief of her pain  She is persistently reluctant to do Prolia injection despite repeated explanations and medication being approved by her insurance  She did start taking Fosamax after her visit in 12/20, this is preferred over Actonel by her insurance No side effects with this and she is still taking it weekly   Primary hypothyroidism: She has had long-standing primary hypothyroidism.   Previously had been taking 125 mcg thyroid since 06/2017 as a stable dose She does not know if her manufacturer had changed with this generic However because of her low normal TSH in 4/21 she is now taking 112 mcg She thinks she was a little tired after switching but now feels fairly  good  She takes her levothyroxine before breakfast daily with water She is quite regular with her regimen and labs are pending  TSH levels:   Lab Results  Component Value Date   TSH 0.32 (L) 07/03/2019   TSH 1.99 03/02/2019   TSH 0.56 10/27/2018   FREET4 1.54 07/03/2019   FREET4 1.24 03/02/2019   FREET4 1.43 10/27/2018    HYPERLIPIDEMIA: not on a statin drug because of patient's refusal to take medications for fear of having pains, cramps, hair loss or various other possible side effects  Her LDL has been between 130 and 160 usually  Lab Results  Component Value Date   CHOL 217 (H) 07/03/2019   CHOL 206 (H) 03/02/2019   CHOL 196 10/27/2018   Lab Results  Component Value Date   HDL 44.20 07/03/2019   HDL 33.50 (L) 03/02/2019   HDL 38.10 (L) 10/27/2018   Lab Results  Component Value Date   LDLCALC  152 (H) 07/03/2019   LDLCALC 140 (H) 03/02/2019   LDLCALC 130 (H) 10/27/2018   Lab Results  Component Value Date   TRIG 102.0 07/03/2019   TRIG 164.0 (H) 03/02/2019   TRIG 144.0 10/27/2018   Lab Results  Component Value Date   CHOLHDL 5 07/03/2019   CHOLHDL 6 03/02/2019   CHOLHDL 5 10/27/2018   Lab Results  Component Value Date   LDLDIRECT 193.5 02/07/2013   LDLDIRECT 189.5 12/11/2012   LDLDIRECT 201.3 12/05/2012      LEUKOCYTOSIS:  She has periodically high white blood cell count without abnormal morphology, higher after steroid injections  Periodically followed by hematologist  Lab Results  Component Value Date   WBC 10.5 07/03/2019   WBC 10.2 04/10/2019   WBC 9.1 10/27/2018   WBC 12.6 (H) 07/21/2018   She takes small doses of Percocet for knee pain, followed by pain clinic now  Swelling of the right calf: She was told by her dermatologist that the right calf was swollen and she had a negative Doppler study ordered by orthopedic surgeon      Objective:   Physical Exam  BP 140/80 (BP Location: Right Arm, Patient Position: Sitting, Cuff Size:  Large)   Pulse 84   Ht 5\' 8"  (1.727 m)   SpO2 98%   BMI 41.16 kg/m    She has a prominence of the right calf but no tenderness. She may have a lipoma which is indistinctly palpable No pedal edema Also has a subcutaneous mobile slightly firm mass on the right outer thigh     Assessment:      OSTEOPOROSIS with history of fracture:  She has been continued on Fosamax without side effects Encourage her to stay on this for up to 5 years  Also on vitamin D weekly, to have vitamin D level checked again on her next visit  HYPERTENSION: Well controlled  She is on benazepril HCTZ as before She will continue the same dosage   HYPOTHYROIDISM:  Has been taking 112 mcg levothyroxine since her TSH was low about 2 months ago She feels fairly good Labs to be checked today  Right calf swelling likely to be from the lipoma or soft tissue swelling: Reassured her that this is not needing any intervention Also had normal venous Doppler last month  History of leukocytosis: Follow-up today     Plan:     As above, labs will be done especially to evaluate thyroid functions to determine her levothyroxine dose  She needs to continue moderating carbohydrates and high-fat meals for weight loss and periodically monitoring her glucose for her prediabetes  She is going to call to schedule with a new PCP She can come back here as needed especially if there is any endocrine issues      [name] 09/04/19

## 2019-09-05 ENCOUNTER — Other Ambulatory Visit: Payer: Self-pay

## 2019-09-05 DIAGNOSIS — E063 Autoimmune thyroiditis: Secondary | ICD-10-CM

## 2019-09-05 MED ORDER — LEVOTHYROXINE SODIUM 100 MCG PO TABS: 100.0000 ug | ORAL_TABLET | Freq: Every day | ORAL | 2 refills | Status: DC

## 2019-09-06 ENCOUNTER — Other Ambulatory Visit: Payer: Self-pay | Admitting: Thoracic Surgery (Cardiothoracic Vascular Surgery)

## 2019-09-06 DIAGNOSIS — I712 Thoracic aortic aneurysm, without rupture, unspecified: Secondary | ICD-10-CM

## 2019-09-30 ENCOUNTER — Other Ambulatory Visit: Payer: Self-pay | Admitting: Endocrinology

## 2019-10-04 ENCOUNTER — Ambulatory Visit
Admission: RE | Admit: 2019-10-04 | Discharge: 2019-10-04 | Disposition: A | Discharge status: Home / Self Care | Payer: Medicare Other | Source: Ambulatory Visit | Attending: Thoracic Surgery (Cardiothoracic Vascular Surgery) | Admitting: Thoracic Surgery (Cardiothoracic Vascular Surgery)

## 2019-10-04 DIAGNOSIS — M4854XA Collapsed vertebra, not elsewhere classified, thoracic region, initial encounter for fracture: Secondary | ICD-10-CM | POA: Diagnosis not present

## 2019-10-04 DIAGNOSIS — I712 Thoracic aortic aneurysm, without rupture, unspecified: Secondary | ICD-10-CM

## 2019-10-04 DIAGNOSIS — Z9882 Breast implant status: Secondary | ICD-10-CM | POA: Diagnosis not present

## 2019-10-04 MED ORDER — IOPAMIDOL (ISOVUE-370) INJECTION 76%
75.0000 mL | Freq: Once | INTRAVENOUS | Status: AC | PRN
  Administered 2019-10-04: 75 mL via INTRAVENOUS

## 2019-10-09 ENCOUNTER — Ambulatory Visit (INDEPENDENT_AMBULATORY_CARE_PROVIDER_SITE_OTHER): Payer: Medicare Other | Admitting: Thoracic Surgery (Cardiothoracic Vascular Surgery)

## 2019-10-09 ENCOUNTER — Other Ambulatory Visit: Payer: Self-pay

## 2019-10-09 ENCOUNTER — Encounter: Payer: Self-pay | Admitting: Thoracic Surgery (Cardiothoracic Vascular Surgery)

## 2019-10-09 VITALS — BP 111/78 | HR 108 | Temp 96.8°F | Resp 16 | Ht 68.0 in

## 2019-10-09 DIAGNOSIS — I712 Thoracic aortic aneurysm, without rupture, unspecified: Secondary | ICD-10-CM

## 2019-10-09 NOTE — Progress Notes (Signed)
[address]       [place] [address]             [phone]    HPI: [name] returns for a scheduled follow-up.  Angela [name] is an 86 year old woman with a history of breast cancer, DVT, PE, type 2 diabetes, hypertension, hyperlipidemia, reflux, arthritis, thoracic aortic atherosclerosis, heart murmur, and an ascending aortic aneurysm.  She was found to have a 4 cm aneurysm in June 2017.  She was found to have a pulmonary embolus at the same time.  She has been followed since then.  She says that she has been feeling well.  She been very anxious about her CT results.  She is having a lot of knee pain and still uses a walker to ambulate.  She is supposed to have an injection next week.  She has had a couple of episodes of mild chest discomfort not associated with exertion.  She has not talked to Dr. Dwyane Dee about that because she does not want to go through an extensive work-up.  Past Medical History:  Diagnosis Date  . Arthritis   . BRCA2 positive 10/214  . Breast cancer ([place]) 1994   left sided cancer; unilateral mastectomy, no chemo or radiation  . Diabetes mellitus   . Dyslipidemia   . GERD (gastroesophageal reflux disease)   . Heart murmur   . Hypertension     Current Outpatient Medications  Medication Sig Dispense Refill  . albuterol (VENTOLIN HFA) 108 (90 Base) MCG/ACT inhaler Inhale 2 puffs into the lungs 4 (four) times daily as needed for wheezing or shortness of breath. 18 g 1  . alendronate (FOSAMAX) 70 MG tablet TAKE 1 TAB ONCE A WEEK, AT LEAST 30 MIN BEFORE 1ST FOOD.DO NOT LIE DOWN FOR 30 MIN AFTER TAKING. 4 tablet 0  . benazepril-hydrochlorthiazide (LOTENSIN HCT) 20-12.5 MG tablet TAKE 1 TABLET ONCE DAILY. 30 tablet 0  . ELIQUIS 5 MG TABS tablet TAKE 1 TABLET BY MOUTH TWICE DAILY. 60 tablet 0  . furosemide (LASIX) 20 MG tablet Take 0.5-1 tablets (10-20 mg total) by mouth daily as needed for fluid or edema (If weight gain by 2lbs in 24  hours). 60 tablet 2  . isosorbide mononitrate (IMDUR) 30 MG 24 hr tablet TAKE 1 TABLET ONCE DAILY. 30 tablet 0  . levothyroxine (SYNTHROID) 100 MCG tablet Take 1 tablet (100 mcg total) by mouth daily before breakfast. 30 tablet 2  . loperamide (IMODIUM A-D) 2 MG tablet Take 1 tablet (2 mg total) by mouth 3 (three) times daily as needed for diarrhea or loose stools. 15 tablet 0  . LORazepam (ATIVAN) 0.5 MG tablet TAKE (1) TABLET TWICE DAILY AS NEEDED FOR ANXIETY. (Patient taking differently: Take 0.5 mg by mouth 2 (two) times daily as needed for anxiety. TAKE (1) TABLET TWICE DAILY AS NEEDED FOR ANXIETY.) 60 tablet 0  . ondansetron (ZOFRAN) 4 MG tablet TAKE 1 TABLET EVERY 8 HOURS AS NEEDED FOR NAUSEA AND VOMITING. 20 tablet 0  . pantoprazole (PROTONIX) 40 MG tablet TAKE 1 TABLET ONCE DAILY. 30 tablet 0  . PERCOCET 10-325 MG per tablet Take 0.25-1 tablets by mouth every 6 (six) hours as needed for pain.     . polyethylene glycol powder (GLYCOLAX/MIRALAX) 17 GM/SCOOP powder DISSOLVE ONE CAPFUL IN LIQUID AND DRINK 3 TIMES A DAY AS NEEDED FOR MODERATE CONSTIPATION. 1020 g 0  . potassium chloride (KLOR-CON) 10 MEQ tablet TAKE 1 TABLET ONCE  DAILY. 30 tablet 0  . PRESCRIPTION MEDICATION Inject 1 application as directed every 30 (thirty) days. Cortisone injections, each knee    . Vitamin D, Ergocalciferol, (DRISDOL) 1.25 MG (50000 UNIT) CAPS capsule TAKE ONE CAPSULE ONCE A WEEK. 4 capsule 0  . VOLTAREN 1 % GEL Apply 2 g topically 4 (four) times daily as needed (pain). To knees    . Multiple Vitamins-Minerals (ICAPS AREDS 2 PO) Take 1 tablet by mouth daily. (Patient not taking: Reported on 09/04/2019)     No current facility-administered medications for this visit.    Physical Exam BP 111/78 (BP Location: Right Arm, Patient Position: Sitting, Cuff Size: Large)   Pulse (!) 108   Temp (!) 96.8 F (36 C)   Resp 16   Ht _0  (1.727 m)   HC 16" (40.6 cm)   SpO2 98%   BMI 41.16 kg/m  Obese 86 year old  woman in no acute distress Alert and oriented x3 Lungs clear bilaterally Cardiac mildly tachycardic, regular, 2/6 systolic murmur Venous stasis changes lower extremities Ambulates with a walker  Diagnostic Tests: CT ANGIOGRAPHY CHEST WITH CONTRAST  TECHNIQUE: Multidetector CT imaging of the chest was performed using the standard protocol during bolus administration of intravenous contrast. Multiplanar CT image reconstructions and MIPs were obtained to evaluate the vascular anatomy.  CONTRAST:  5m ISOVUE-370 IOPAMIDOL (ISOVUE-370) INJECTION 76%  COMPARISON:  09/21/2018  FINDINGS: Cardiovascular: Tortuous aorta. Stable ascending thoracic aortic aneurysm, 4.1 cm. Heart is normal size. No dissection.  Mediastinum/Nodes: No mediastinal, hilar, or axillary adenopathy. Trachea and esophagus are unremarkable. Thyroid unremarkable.  Lungs/Pleura: Lungs are clear. No focal airspace opacities or suspicious nodules. No effusions.  Upper Abdomen: Imaging into the upper abdomen shows no acute findings.  Musculoskeletal: Bilateral breast implants. Chest wall soft tissues unremarkable. No acute bony abnormality. Multiple compression fractures in the mid and lower thoracic spine are stable.  Review of the MIP images confirms the above findings.  IMPRESSION: Stable 4.1 cm ascending thoracic aortic aneurysm. Tortuous aorta. Recommend annual imaging followup by CTA or MRA. This recommendation follows 2010 ACCF/AHA/AATS/ACR/ASA/SCA/SCAI/SIR/STS/SVM Guidelines for the Diagnosis and Management of Patients with Thoracic Aortic Disease. Circulation. 2010; 121:: E952-W413 Aortic aneurysm NOS (ICD10-I71.9)  No acute cardiopulmonary disease.   Electronically Signed   By: KRolm BaptiseM.D.   On: 10/04/2019 10:04 I personally reviewed the CT images and concur with the findings noted above  Impression: Angela [name] an 86year old woman with a history of breast cancer,  DVT, PE, type 2 diabetes, hypertension, hyperlipidemia, reflux, arthritis, thoracic aortic atherosclerosis, heart murmur, and an ascending aortic aneurysm.  She has a 4.1 cm ascending aortic aneurysm.  This has been stable over time since being discovered in 2017.  In her age I do think this will ever be an issue.  The importance of blood pressure control was emphasized.  Her blood pressure was within normal limits today.  She is mildly tachycardic which was also the case months on a year ago.  DVT/PE-she is on Eliquis.  Plan: Return in 1 year with CT chest  SMelrose Nakayama MD [facility] [phone]

## 2019-10-10 DIAGNOSIS — M1990 Unspecified osteoarthritis, unspecified site: Secondary | ICD-10-CM | POA: Diagnosis not present

## 2019-10-10 DIAGNOSIS — G894 Chronic pain syndrome: Secondary | ICD-10-CM | POA: Diagnosis not present

## 2019-10-10 DIAGNOSIS — M179 Osteoarthritis of knee, unspecified: Secondary | ICD-10-CM | POA: Diagnosis not present

## 2019-10-10 DIAGNOSIS — Z79891 Long term (current) use of opiate analgesic: Secondary | ICD-10-CM | POA: Diagnosis not present

## 2019-10-15 DIAGNOSIS — M170 Bilateral primary osteoarthritis of knee: Secondary | ICD-10-CM | POA: Diagnosis not present

## 2019-11-01 ENCOUNTER — Other Ambulatory Visit: Payer: Self-pay | Admitting: Endocrinology

## 2019-11-01 NOTE — Telephone Encounter (Signed)
At Dr. Ronnie Derby request, all 7 Rx's have been refilled for 30 day supply. Must establish with new PCP

## 2019-11-01 NOTE — Telephone Encounter (Signed)
Please review refill requests and advise if all are appropriate to refill

## 2019-11-01 NOTE — Telephone Encounter (Signed)
OK to refill one time for 30 days otherwise she will need to get from new PCP

## 2019-11-04 ENCOUNTER — Other Ambulatory Visit: Payer: Self-pay | Admitting: Endocrinology

## 2019-11-13 ENCOUNTER — Ambulatory Visit: Payer: Medicare Other | Admitting: Endocrinology

## 2019-11-19 DIAGNOSIS — H35373 Puckering of macula, bilateral: Secondary | ICD-10-CM | POA: Diagnosis not present

## 2019-11-19 DIAGNOSIS — H353132 Nonexudative age-related macular degeneration, bilateral, intermediate dry stage: Secondary | ICD-10-CM | POA: Diagnosis not present

## 2019-11-28 ENCOUNTER — Ambulatory Visit: Payer: Medicare Other | Admitting: Endocrinology

## 2019-11-29 NOTE — Progress Notes (Signed)
Subjective:     Patient ID: Angela [name], female   DOB: 1932/09/29, 86 y.o.   MRN: [id number]  HPI  Reason for visit: Follow-up of hypothyroidism and other problems   Hypertension   Has been on Lotensin HCT 20/12.5 long-term, this has been a stable dose  Does not monitor blood pressure at home   BP Readings from Last 3 Encounters:  11/30/19 122/76  10/09/19 111/78  09/04/19 140/80   Also has history of for occasional edema, has been given Lasix to use if needed  PREDIABETES: She has had long-standing impaired fasting glucose  A1c has been consistently normal and below 6% usually  In the past had been treated with metformin  Periodically checks home blood sugars also which are normal usually  Most recent glucose was 103 nonfasting  Labs as follows    Lab Results  Component Value Date   HGBA1C 5.7 07/03/2019   HGBA1C 5.7 03/02/2019   HGBA1C 5.9 10/27/2018   Lab Results  Component Value Date   MICROALBUR 0.4 12/05/2012   LDLCALC 152 (H) 07/03/2019   CREATININE 0.65 09/04/2019    OTHER active problems discussed today including hypothyroidism are detailed in review of systems:   Allergies as of 11/30/2019      Reactions   Crestor [rosuvastatin Calcium] Anaphylaxis, Other (See Comments)   Abdominal discomfort   Gadolinium Nausea And Vomiting    pt states she had nausea after receiving MAGNEVIST for breast imaging   Pravastatin Other (See Comments)   Locked jaw, weakness   Simvastatin Other (See Comments)   Weakness and locked jaw.   Penicillins Swelling, Rash   Patient tolerates amoxicillin Has patient had a PCN reaction causing immediate rash, facial/tongue/throat swelling, SOB or lightheadedness with hypotension: yes Has patient had a PCN reaction causing severe rash involving mucus membranes or skin necrosis: unknown Has patient had a PCN reaction that required hospitalization: unknown Has patient had a PCN reaction occurring within the  last 10 years: no If all of the above answers are "NO", then may proceed with Cephalosporin use.      Medication List       Accurate as of November 30, 2019 10:32 AM. If you have any questions, ask your nurse or doctor.        albuterol 108 (90 Base) MCG/ACT inhaler Commonly known as: Ventolin HFA Inhale 2 puffs into the lungs 4 (four) times daily as needed for wheezing or shortness of breath.   alendronate 70 MG tablet Commonly known as: FOSAMAX Take 1 tablet (70 mg total) by mouth once a week. DO NOT SEND REFILLS TO DR. Dwyane Dee   apixaban 5 MG Tabs tablet Commonly known as: Eliquis Take 1 tablet (5 mg total) by mouth 2 (two) times daily. DO NOT SEND REFILLS TO DR Dwyane Dee   benazepril-hydrochlorthiazide 20-12.5 MG tablet Commonly known as: LOTENSIN HCT Take 1 tablet by mouth daily. DO NOT SEND REFILLS TO DR. Dwyane Dee   furosemide 20 MG tablet Commonly known as: LASIX Take 0.5-1 tablets (10-20 mg total) by mouth daily as needed for fluid or edema (If weight gain by 2lbs in 24 hours).   ICAPS AREDS 2 PO Take 1 tablet by mouth daily.   isosorbide mononitrate 30 MG 24 hr tablet Commonly known as: IMDUR 1 tab qd, please establish new PCP for any refills   levothyroxine 100 MCG tablet Commonly known as: Synthroid Take 1 tablet (100 mcg total) by mouth daily before breakfast.   loperamide  2 MG tablet Commonly known as: Imodium A-D Take 1 tablet (2 mg total) by mouth 3 (three) times daily as needed for diarrhea or loose stools.   LORazepam 0.5 MG tablet Commonly known as: ATIVAN TAKE (1) TABLET TWICE DAILY AS NEEDED FOR ANXIETY. What changed: See the new instructions.   ondansetron 4 MG tablet Commonly known as: ZOFRAN TAKE 1 TABLET EVERY 8 HOURS AS NEEDED FOR NAUSEA AND VOMITING.   pantoprazole 40 MG tablet Commonly known as: PROTONIX Take 1 tablet (40 mg total) by mouth daily. DO NOT SEND REFILLS TO DR. Dwyane Dee   Percocet 10-325 MG tablet Generic drug:  oxyCODONE-acetaminophen Take 0.25-1 tablets by mouth every 6 (six) hours as needed for pain.   polyethylene glycol powder 17 GM/SCOOP powder Commonly known as: GLYCOLAX/MIRALAX Dissolve 1 capful in liquid TID PRN constipation; DO NOT SEND REFILLS TO DR. Dwyane Dee   potassium chloride 10 MEQ tablet Commonly known as: KLOR-CON Take 1 tablet (10 mEq total) by mouth daily. DO NOT SEND REFILLS TO DR. Dwyane Dee   PRESCRIPTION MEDICATION Inject 1 application as directed every 30 (thirty) days. Cortisone injections, each knee   Vitamin D (Ergocalciferol) 1.25 MG (50000 UNIT) Caps capsule Commonly known as: DRISDOL Take 1 capsule (50,000 Units total) by mouth every 7 (seven) days. DO NOT SEND REFILLS TO DR. Dwyane Dee   Voltaren 1 % Gel Generic drug: diclofenac Sodium Apply 2 g topically 4 (four) times daily as needed (pain). To knees       Review of Systems   OSTEOPOROSIS:  She had developed a vertebral fracture late 2018; she  also developed rib fractures in the mid 2018 X-ray report as follows: Vertebral augmentation at T12. A mild T7 compression deformity is new since June. A mild to moderate T9 compression deformity is not significantly changed. Bone density report not available from orthopedic surgeon  Previously had a course of calcitonin that was scribed in 01/2017 with relief of her pain  She is persistently reluctant to do Prolia injection despite repeated explanations and medication being approved by her insurance  She did start taking Fosamax after her visit in 12/20, this is preferred over Actonel by her insurance No side effects with this and she is still taking it weekly   Primary hypothyroidism: She has had long-standing primary hypothyroidism.   Previously had been taking 125 mcg generic levothyroxine since 06/2017 as a stable dose   However because of her low TSH levels has been progressively getting lower doses and now only 100 mcg daily As before she gets her prescription  from get to the pharmacy and she does not think her manufacturer has changed  Subjectively does not feel any different, no fatigue  She takes her levothyroxine before breakfast daily with water She is quite regular with her regimen and labs are pending  TSH levels:   Lab Results  Component Value Date   TSH 0.11 (L) 09/04/2019   TSH 0.32 (L) 07/03/2019   TSH 1.99 03/02/2019   FREET4 1.59 09/04/2019   FREET4 1.54 07/03/2019   FREET4 1.24 03/02/2019    HYPERLIPIDEMIA: not on a statin drug because of patient's refusal to take medications for fear of having pains, cramps, hair loss or various other possible side effects  Her LDL has been between 130 and 160 more recently  Lab Results  Component Value Date   CHOL 217 (H) 07/03/2019   CHOL 206 (H) 03/02/2019   CHOL 196 10/27/2018   Lab Results  Component Value Date  HDL 44.20 07/03/2019   HDL 33.50 (L) 03/02/2019   HDL 38.10 (L) 10/27/2018   Lab Results  Component Value Date   LDLCALC 152 (H) 07/03/2019   LDLCALC 140 (H) 03/02/2019   LDLCALC 130 (H) 10/27/2018   Lab Results  Component Value Date   TRIG 102.0 07/03/2019   TRIG 164.0 (H) 03/02/2019   TRIG 144.0 10/27/2018   Lab Results  Component Value Date   CHOLHDL 5 07/03/2019   CHOLHDL 6 03/02/2019   CHOLHDL 5 10/27/2018   Lab Results  Component Value Date   LDLDIRECT 193.5 02/07/2013   LDLDIRECT 189.5 12/11/2012   LDLDIRECT 201.3 12/05/2012      LEUKOCYTOSIS:  She has periodically high white blood cell count without abnormal morphology, higher after steroid injections  Is seeing the hematologist also once or twice a year  Lab Results  Component Value Date   WBC 14.3 (H) 09/04/2019   WBC 10.5 07/03/2019   WBC 10.2 04/10/2019   WBC 9.1 10/27/2018   She takes small doses of Percocet for knee pain, followed by pain clinic   Prior history of atypical chest pain: No recurrence, she wants to continue taking isosorbide Last visit with cardiology was  in 2017 Also has trivial aortic regurgitation     Objective:   Physical Exam  BP 122/76 (BP Location: Left Arm, Patient Position: Sitting, Cuff Size: Large)   Pulse 93   Ht 5\' 8"  (1.727 m)   SpO2 94%   BMI 41.16 kg/m   Thyroid nonpalpable No lymphadenopathy in the neck No ankle edema     Assessment:      OSTEOPOROSIS with history of fracture:  She has been continued on Fosamax which she is tolerating well Encourage her to stay on this for up to 5 years  Also on prescription vitamin D weekly, to have vitamin D level checked again  HYPERTENSION: Well controlled  She is on benazepril HCTZ which she will continue Labs to be checked today  HYPOTHYROIDISM:  Has been taking 100 mcg levothyroxine since her TSH was low about 2 months ago She feels fairly good Repeat TSH due today   History of leukocytosis: Has not been a significant and is scheduled to see hematologist     Plan:     As above  She can come back here as needed especially if there is any endocrine issues      [name] 11/30/19

## 2019-11-30 ENCOUNTER — Encounter: Payer: Self-pay | Admitting: Endocrinology

## 2019-11-30 ENCOUNTER — Other Ambulatory Visit: Payer: Self-pay

## 2019-11-30 ENCOUNTER — Ambulatory Visit (INDEPENDENT_AMBULATORY_CARE_PROVIDER_SITE_OTHER): Payer: Medicare Other | Admitting: Endocrinology

## 2019-11-30 VITALS — BP 122/76 | HR 93 | Ht 68.0 in

## 2019-11-30 DIAGNOSIS — E063 Autoimmune thyroiditis: Secondary | ICD-10-CM

## 2019-11-30 DIAGNOSIS — E559 Vitamin D deficiency, unspecified: Secondary | ICD-10-CM

## 2019-11-30 DIAGNOSIS — E7800 Pure hypercholesterolemia, unspecified: Secondary | ICD-10-CM

## 2019-11-30 DIAGNOSIS — I10 Essential (primary) hypertension: Secondary | ICD-10-CM

## 2019-11-30 DIAGNOSIS — R7303 Prediabetes: Secondary | ICD-10-CM | POA: Diagnosis not present

## 2019-12-03 ENCOUNTER — Other Ambulatory Visit: Payer: Self-pay | Admitting: Endocrinology

## 2019-12-03 ENCOUNTER — Telehealth: Payer: Self-pay | Admitting: Endocrinology

## 2019-12-03 ENCOUNTER — Other Ambulatory Visit: Payer: Self-pay

## 2019-12-03 DIAGNOSIS — E063 Autoimmune thyroiditis: Secondary | ICD-10-CM

## 2019-12-03 MED ORDER — PANTOPRAZOLE SODIUM 40 MG PO TBEC
40.0000 mg | DELAYED_RELEASE_TABLET | Freq: Every day | ORAL | 0 refills | Status: DC
Start: 2019-12-03 — End: 2019-12-31

## 2019-12-03 MED ORDER — LEVOTHYROXINE SODIUM 100 MCG PO TABS: ORAL_TABLET | ORAL | 0 refills | Status: DC

## 2019-12-03 MED ORDER — ISOSORBIDE MONONITRATE ER 30 MG PO TB24: ORAL_TABLET | ORAL | 0 refills | Status: DC

## 2019-12-03 MED ORDER — FUROSEMIDE 20 MG PO TABS: 10.0000 mg | ORAL_TABLET | Freq: Every day | ORAL | 0 refills | Status: DC | PRN

## 2019-12-03 MED ORDER — APIXABAN 5 MG PO TABS
5.0000 mg | ORAL_TABLET | Freq: Two times a day (BID) | ORAL | 0 refills | Status: DC
Start: 2019-12-03 — End: 2019-12-31

## 2019-12-03 MED ORDER — VITAMIN D (ERGOCALCIFEROL) 1.25 MG (50000 UNIT) PO CAPS: ORAL_CAPSULE | ORAL | 0 refills | Status: DC

## 2019-12-03 MED ORDER — BENAZEPRIL-HYDROCHLOROTHIAZIDE 20-12.5 MG PO TABS: 1.0000 | ORAL_TABLET | Freq: Every day | ORAL | 0 refills | Status: DC

## 2019-12-03 MED ORDER — POTASSIUM CHLORIDE ER 10 MEQ PO TBCR: 10.0000 meq | EXTENDED_RELEASE_TABLET | Freq: Every day | ORAL | 0 refills | Status: DC

## 2019-12-03 NOTE — Telephone Encounter (Signed)
Please refill all medications as requested by pharmacy, change the isosorbide to 30-day prescription instead of 15, thanks

## 2019-12-03 NOTE — Telephone Encounter (Signed)
Keri at [facility] requests to be called at ph# [phone] re: Angela [name] wants to make sure that Dr. Dwyane Dee is still prescribing patient's medications/refills before she sends over patient's refill requests.

## 2019-12-03 NOTE — Telephone Encounter (Signed)
Sent in all the appropriate prescriptions-this has been resolved

## 2019-12-03 NOTE — Telephone Encounter (Signed)
Would you like to refill thyroid and osteoporosis medication or are you remaining her PCP?

## 2019-12-06 DIAGNOSIS — M170 Bilateral primary osteoarthritis of knee: Secondary | ICD-10-CM | POA: Diagnosis not present

## 2019-12-18 ENCOUNTER — Other Ambulatory Visit (INDEPENDENT_AMBULATORY_CARE_PROVIDER_SITE_OTHER): Payer: Medicare Other

## 2019-12-18 DIAGNOSIS — E063 Autoimmune thyroiditis: Secondary | ICD-10-CM | POA: Diagnosis not present

## 2019-12-18 DIAGNOSIS — I10 Essential (primary) hypertension: Secondary | ICD-10-CM

## 2019-12-18 DIAGNOSIS — E559 Vitamin D deficiency, unspecified: Secondary | ICD-10-CM

## 2019-12-18 LAB — COMPREHENSIVE METABOLIC PANEL
ALT: 12 U/L (ref 0–35)
AST: 11 U/L (ref 0–37)
Albumin: 3.9 g/dL (ref 3.5–5.2)
Alkaline Phosphatase: 129 U/L — ABNORMAL HIGH (ref 39–117)
BUN: 20 mg/dL (ref 6–23)
CO2: 33 mEq/L — ABNORMAL HIGH (ref 19–32)
Calcium: 9 mg/dL (ref 8.4–10.5)
Chloride: 99 mEq/L (ref 96–112)
Creatinine, Ser: 0.69 mg/dL (ref 0.40–1.20)
GFR: 80.76 mL/min (ref >60.00)
Glucose, Bld: 127 mg/dL — ABNORMAL HIGH (ref 70–99)
Potassium: 4 mEq/L (ref 3.5–5.1)
Sodium: 138 mEq/L (ref 135–145)
Total Bilirubin: 0.6 mg/dL (ref 0.2–1.2)
Total Protein: 6.7 g/dL (ref 6.0–8.3)

## 2019-12-18 LAB — T4, FREE: Free T4: 1.16 ng/dL (ref 0.60–1.60)

## 2019-12-18 LAB — TSH: TSH: 0.72 u[IU]/mL (ref 0.35–4.50)

## 2019-12-18 LAB — VITAMIN D 25 HYDROXY (VIT D DEFICIENCY, FRACTURES): VITD: 43.58 ng/mL (ref 30.00–100.00)

## 2019-12-19 NOTE — Progress Notes (Signed)
Please call to let patient know that the lab results are normal and no further action needed

## 2019-12-20 ENCOUNTER — Other Ambulatory Visit: Payer: Self-pay

## 2019-12-20 ENCOUNTER — Ambulatory Visit (INDEPENDENT_AMBULATORY_CARE_PROVIDER_SITE_OTHER): Payer: Medicare Other | Admitting: Endocrinology

## 2019-12-20 DIAGNOSIS — Z23 Encounter for immunization: Secondary | ICD-10-CM

## 2019-12-20 NOTE — Progress Notes (Signed)
Pt came in the office for high dose flu shot, this was given on the right deltoid   Above procedure attested  [name]

## 2019-12-31 ENCOUNTER — Other Ambulatory Visit: Payer: Self-pay | Admitting: Endocrinology

## 2019-12-31 DIAGNOSIS — E063 Autoimmune thyroiditis: Secondary | ICD-10-CM

## 2019-12-31 NOTE — Telephone Encounter (Signed)
Okay to fill? 

## 2020-01-01 DIAGNOSIS — H353132 Nonexudative age-related macular degeneration, bilateral, intermediate dry stage: Secondary | ICD-10-CM | POA: Diagnosis not present

## 2020-01-01 DIAGNOSIS — H43811 Vitreous degeneration, right eye: Secondary | ICD-10-CM | POA: Diagnosis not present

## 2020-01-01 DIAGNOSIS — H35373 Puckering of macula, bilateral: Secondary | ICD-10-CM | POA: Diagnosis not present

## 2020-01-01 DIAGNOSIS — H35033 Hypertensive retinopathy, bilateral: Secondary | ICD-10-CM | POA: Diagnosis not present

## 2020-01-22 DIAGNOSIS — M170 Bilateral primary osteoarthritis of knee: Secondary | ICD-10-CM | POA: Diagnosis not present

## 2020-01-29 DIAGNOSIS — M179 Osteoarthritis of knee, unspecified: Secondary | ICD-10-CM | POA: Diagnosis not present

## 2020-01-29 DIAGNOSIS — M1990 Unspecified osteoarthritis, unspecified site: Secondary | ICD-10-CM | POA: Diagnosis not present

## 2020-01-29 DIAGNOSIS — G894 Chronic pain syndrome: Secondary | ICD-10-CM | POA: Diagnosis not present

## 2020-01-29 DIAGNOSIS — Z79891 Long term (current) use of opiate analgesic: Secondary | ICD-10-CM | POA: Diagnosis not present

## 2020-01-30 ENCOUNTER — Encounter: Payer: Self-pay | Admitting: Family

## 2020-01-30 ENCOUNTER — Ambulatory Visit (INDEPENDENT_AMBULATORY_CARE_PROVIDER_SITE_OTHER): Payer: Medicare Other | Admitting: Family

## 2020-01-30 VITALS — BP 136/80 | HR 95 | Temp 98.0°F | Ht 68.0 in

## 2020-01-30 DIAGNOSIS — E039 Hypothyroidism, unspecified: Secondary | ICD-10-CM

## 2020-01-30 DIAGNOSIS — I10 Essential (primary) hypertension: Secondary | ICD-10-CM

## 2020-01-30 DIAGNOSIS — M810 Age-related osteoporosis without current pathological fracture: Secondary | ICD-10-CM | POA: Diagnosis not present

## 2020-01-30 DIAGNOSIS — K219 Gastro-esophageal reflux disease without esophagitis: Secondary | ICD-10-CM | POA: Diagnosis not present

## 2020-01-30 DIAGNOSIS — I2699 Other pulmonary embolism without acute cor pulmonale: Secondary | ICD-10-CM | POA: Diagnosis not present

## 2020-01-30 DIAGNOSIS — I700 Atherosclerosis of aorta: Secondary | ICD-10-CM

## 2020-01-30 MED ORDER — ALBUTEROL SULFATE HFA 108 (90 BASE) MCG/ACT IN AERS: 2.0000 | INHALATION_SPRAY | Freq: Four times a day (QID) | RESPIRATORY_TRACT | 1 refills | Status: DC | PRN

## 2020-01-30 NOTE — Progress Notes (Signed)
**Note Angela-Identified via Obfuscation** Angela [name] Angela [name] is a 86 y.o. female with the following history as recorded in EpicCare:  Patient Active Problem List   Diagnosis Date Noted  . Sepsis (Angela [name]) 05/22/2018  . Ascending aortic aneurysm (Angela [name]) 01/13/2016  . Thoracic aortic atherosclerosis (Angela [name]) 01/13/2016  . Pulmonary embolism without acute cor pulmonale (Angela [name])   . Pulmonary emboli (Angela [name]) 08/30/2015  . Acute massive pulmonary embolism (Angela [name]) 08/30/2015  . Leukocytosis 12/23/2014  . BRCA2 positive   . Hypothyroidism (acquired) 12/04/2012  . Heart murmur 09/01/2012  . Chest pain 09/17/2010  . Hypertension   . Dyslipidemia   . Prediabetes   . Arthritis   . GERD (gastroesophageal reflux disease)     Current Outpatient Medications  Medication Sig Dispense Refill  . albuterol (VENTOLIN HFA) 108 (90 Base) MCG/ACT inhaler Inhale 2 puffs into the lungs 4 (four) times daily as needed for wheezing or shortness of breath. 18 g 1  . alendronate (FOSAMAX) 70 MG tablet TAKE 1 TAB ONCE A WEEK, AT LEAST 30 MIN BEFORE 1ST FOOD.DO NOT LIE DOWN FOR 30 MIN AFTER TAKING. 12 tablet 3  . benazepril-hydrochlorthiazide (LOTENSIN HCT) 20-12.5 MG tablet TAKE 1 TABLET ONCE DAILY. 90 tablet 3  . ELIQUIS 5 MG TABS tablet TAKE 1 TABLET BY MOUTH TWICE DAILY. 180 tablet 3  . furosemide (LASIX) 20 MG tablet Take 0.5-1 tablets (10-20 mg total) by mouth daily as needed for fluid or edema (If weight gain by 2lbs in 24 hours). 60 tablet 0  . isosorbide mononitrate (IMDUR) 30 MG 24 hr tablet TAKE 1 TABLET ONCE DAILY. 90 tablet 3  . levothyroxine (SYNTHROID) 100 MCG tablet TAKE 1 TABLET BEFORE BREAKFAST. 90 tablet 3  . loperamide (IMODIUM A-D) 2 MG tablet Take 1 tablet (2 mg total) by mouth 3 (three) times daily as needed for diarrhea or loose stools. 15 tablet 0  . LORazepam (ATIVAN) 0.5 MG tablet TAKE (1) TABLET TWICE DAILY AS NEEDED FOR ANXIETY. (Patient taking differently: Take 0.5 mg by mouth 2 (two) times daily as needed for anxiety. TAKE (1) TABLET TWICE  DAILY AS NEEDED FOR ANXIETY.) 60 tablet 0  . Multiple Vitamins-Minerals (ICAPS AREDS 2 PO) Take 1 tablet by mouth daily.     . ondansetron (ZOFRAN) 4 MG tablet TAKE 1 TABLET EVERY 8 HOURS AS NEEDED FOR NAUSEA AND VOMITING. 20 tablet 0  . pantoprazole (PROTONIX) 40 MG tablet TAKE 1 TABLET ONCE DAILY. 90 tablet 3  . PERCOCET 10-325 MG per tablet Take 0.25-1 tablets by mouth every 6 (six) hours as needed for pain.     . polyethylene glycol powder (GLYCOLAX/MIRALAX) 17 GM/SCOOP powder DISSOLVE ONE CAPFUL IN LIQUID AND DRINK 3 TIMES A DAY AS NEEDED FOR MODERATE CONSTIPATION. 850 g 1  . potassium chloride (KLOR-CON) 10 MEQ tablet TAKE 1 TABLET ONCE DAILY. 90 tablet 3  . PRESCRIPTION MEDICATION Inject 1 application as directed every 30 (thirty) days. Cortisone injections, each knee    . Vitamin D, Ergocalciferol, (DRISDOL) 1.25 MG (50000 UNIT) CAPS capsule TAKE ONE CAPSULE ONCE A WEEK. 12 capsule 0  . VOLTAREN 1 % GEL Apply 2 g topically 4 (four) times daily as needed (pain). To knees     No current facility-administered medications for this visit.    Allergies: Crestor [rosuvastatin calcium], Gadolinium, Pravastatin, Simvastatin, and Penicillins  Past Medical History:  Diagnosis Date  . Arthritis   . BRCA2 positive 10/214  . Breast cancer (Angela [name]) 1994   left sided cancer; unilateral mastectomy, no chemo or radiation  .  Diabetes mellitus   . Dyslipidemia   . GERD (gastroesophageal reflux disease)   . Heart murmur   . Hypertension     Past Surgical History:  Procedure Laterality Date  . CARDIAC CATHETERIZATION     NORMAL CORONARY ARTERIES  . CHOLECYSTECTOMY    . MASTECTOMY     LEFT BREAST  . TONSILLECTOMY      Family History  Problem Relation Age of Onset  . Heart attack Maternal Grandmother   . Pancreatic cancer Mother 70  . Bladder Cancer Father 76       heavy smoker and drinker  . Ovarian cancer Sister 62  . Hypertension Sister   . Lung cancer Brother 75  . Ovarian cancer  Maternal Aunt 62  . Stomach cancer Maternal Grandfather   . Bone cancer Paternal Grandmother        unsure if this was a primary cancer  . Stomach cancer Paternal Grandfather   . Lung cancer Maternal Aunt        died in her 22s; former smoker  . Breast cancer Cousin        dx in her late 81s to 57s; paternal cousin  . Heart disease Neg Hx     Social History   Tobacco Use  . Smoking status: Never Smoker  . Smokeless tobacco: Never Used  Substance Use Topics  . Alcohol use: No    Subjective:  Transfer of care from Dr. Dwyane Dee; in baseline state of health today; is up to date on refills/ labs/ preventive healthcare needs; Denies any chest pain, shortness of breath, blurred vision or headache Planning to see her GYN; scheduled to follow-up with oncology in January 2022;    Objective:  Vitals:   01/30/20 1121  BP: 136/80  Pulse: 95  Temp: 98 F (36.7 C)  TempSrc: Oral  SpO2: 96%  Height: _0  (1.727 m)    General: Well developed, well nourished, in no acute distress  Skin : Warm and dry.  Head: Normocephalic and atraumatic  Eyes: Sclera and conjunctiva clear; pupils round and reactive to light; extraocular movements intact  Ears: External normal; canals clear; tympanic membranes normal  Oropharynx: Pink, supple. No suspicious lesions  Neck: Supple without thyromegaly, adenopathy  Lungs: Respirations unlabored; clear to auscultation bilaterally without wheeze, rales, rhonchi  CVS exam: normal rate and regular rhythm.  Neurologic: Alert and oriented; speech intact; face symmetrical; uses walker;  Assessment:  1. Essential hypertension   2. Age-related osteoporosis without current pathological fracture   3. Thoracic aortic atherosclerosis ([place])   4. Gastroesophageal reflux disease, unspecified whether esophagitis present   5. Hypothyroidism (acquired)   6. Pulmonary embolism without acute cor pulmonale, unspecified chronicity, unspecified pulmonary embolism type ([place])      Plan:  Reviewed labs and made sure that refills are up to date; refill updated for Ventolin HFA to use prn; keep follow up with specialists as scheduled; Okay to follow-up here in 8 months;  This visit occurred during the SARS-CoV-2 public health emergency.  Safety protocols were in place, including screening questions prior to the visit, additional usage of staff PPE, and extensive cleaning of exam room while observing appropriate contact time as indicated for disinfecting solutions.     Return in about 8 months (around 09/28/2020).  No orders of the defined types were placed in this encounter.   Requested Prescriptions   Signed Prescriptions Disp Refills  . albuterol (VENTOLIN HFA) 108 (90 Base) MCG/ACT inhaler 18 g 1  Sig: Inhale 2 puffs into the lungs 4 (four) times daily as needed for wheezing or shortness of breath.

## 2020-02-02 DIAGNOSIS — G894 Chronic pain syndrome: Secondary | ICD-10-CM | POA: Diagnosis not present

## 2020-02-20 ENCOUNTER — Other Ambulatory Visit: Payer: Self-pay | Admitting: Family

## 2020-02-26 DIAGNOSIS — G894 Chronic pain syndrome: Secondary | ICD-10-CM | POA: Diagnosis not present

## 2020-02-26 DIAGNOSIS — M1990 Unspecified osteoarthritis, unspecified site: Secondary | ICD-10-CM | POA: Diagnosis not present

## 2020-02-26 DIAGNOSIS — Z79891 Long term (current) use of opiate analgesic: Secondary | ICD-10-CM | POA: Diagnosis not present

## 2020-02-26 DIAGNOSIS — M179 Osteoarthritis of knee, unspecified: Secondary | ICD-10-CM | POA: Diagnosis not present

## 2020-02-29 NOTE — Telephone Encounter (Signed)
[place] calling regarding this refill request  Patient needs it filled asap so that she can get all her meds sent out at one time

## 2020-03-03 DIAGNOSIS — G894 Chronic pain syndrome: Secondary | ICD-10-CM | POA: Diagnosis not present

## 2020-03-11 DIAGNOSIS — M170 Bilateral primary osteoarthritis of knee: Secondary | ICD-10-CM | POA: Diagnosis not present

## 2020-03-12 ENCOUNTER — Telehealth: Payer: Self-pay | Admitting: Endocrinology

## 2020-03-12 NOTE — Telephone Encounter (Signed)
Patient called stating she has only seen her new primary care Dr once and Dr Dwyane Dee is always able to help her - she's had ringing in her ears and she would like to speak to Dr Dwyane Dee about it. Ph# [phone]

## 2020-03-12 NOTE — Telephone Encounter (Signed)
Please advise 

## 2020-03-12 NOTE — Telephone Encounter (Signed)
There is no specific treatment usually for this but recommend that she ask her PCP to refer her to an ENT specialist

## 2020-03-13 NOTE — Telephone Encounter (Signed)
Spoken to patient and notified Dr Kumar's comments. Verbalized understanding.   

## 2020-03-18 DIAGNOSIS — Z23 Encounter for immunization: Secondary | ICD-10-CM | POA: Diagnosis not present

## 2020-03-20 DIAGNOSIS — L602 Onychogryphosis: Secondary | ICD-10-CM | POA: Diagnosis not present

## 2020-03-20 DIAGNOSIS — I739 Peripheral vascular disease, unspecified: Secondary | ICD-10-CM | POA: Diagnosis not present

## 2020-03-20 DIAGNOSIS — L84 Corns and callosities: Secondary | ICD-10-CM | POA: Diagnosis not present

## 2020-03-25 DIAGNOSIS — Z79891 Long term (current) use of opiate analgesic: Secondary | ICD-10-CM | POA: Diagnosis not present

## 2020-03-25 DIAGNOSIS — M1990 Unspecified osteoarthritis, unspecified site: Secondary | ICD-10-CM | POA: Diagnosis not present

## 2020-03-25 DIAGNOSIS — M179 Osteoarthritis of knee, unspecified: Secondary | ICD-10-CM | POA: Diagnosis not present

## 2020-03-25 DIAGNOSIS — G894 Chronic pain syndrome: Secondary | ICD-10-CM | POA: Diagnosis not present

## 2020-03-31 ENCOUNTER — Other Ambulatory Visit: Payer: Self-pay | Admitting: Family

## 2020-04-02 DIAGNOSIS — M1990 Unspecified osteoarthritis, unspecified site: Secondary | ICD-10-CM | POA: Diagnosis not present

## 2020-04-02 DIAGNOSIS — M179 Osteoarthritis of knee, unspecified: Secondary | ICD-10-CM | POA: Diagnosis not present

## 2020-04-02 DIAGNOSIS — G894 Chronic pain syndrome: Secondary | ICD-10-CM | POA: Diagnosis not present

## 2020-04-02 DIAGNOSIS — Z79891 Long term (current) use of opiate analgesic: Secondary | ICD-10-CM | POA: Diagnosis not present

## 2020-04-10 ENCOUNTER — Inpatient Hospital Stay: Payer: Medicare Other | Admitting: Oncology

## 2020-04-22 DIAGNOSIS — G894 Chronic pain syndrome: Secondary | ICD-10-CM | POA: Diagnosis not present

## 2020-04-22 DIAGNOSIS — Z79891 Long term (current) use of opiate analgesic: Secondary | ICD-10-CM | POA: Diagnosis not present

## 2020-04-23 DIAGNOSIS — Z85828 Personal history of other malignant neoplasm of skin: Secondary | ICD-10-CM | POA: Diagnosis not present

## 2020-04-23 DIAGNOSIS — L723 Sebaceous cyst: Secondary | ICD-10-CM | POA: Diagnosis not present

## 2020-04-23 DIAGNOSIS — L820 Inflamed seborrheic keratosis: Secondary | ICD-10-CM | POA: Diagnosis not present

## 2020-04-23 DIAGNOSIS — L821 Other seborrheic keratosis: Secondary | ICD-10-CM | POA: Diagnosis not present

## 2020-04-23 DIAGNOSIS — L570 Actinic keratosis: Secondary | ICD-10-CM | POA: Diagnosis not present

## 2020-04-28 ENCOUNTER — Other Ambulatory Visit: Payer: Self-pay | Admitting: Family

## 2020-04-28 DIAGNOSIS — M170 Bilateral primary osteoarthritis of knee: Secondary | ICD-10-CM | POA: Diagnosis not present

## 2020-05-02 DIAGNOSIS — G894 Chronic pain syndrome: Secondary | ICD-10-CM | POA: Diagnosis not present

## 2020-05-05 ENCOUNTER — Inpatient Hospital Stay: Payer: Medicare Other

## 2020-05-05 ENCOUNTER — Telehealth: Payer: Self-pay | Admitting: Hematology

## 2020-05-05 ENCOUNTER — Other Ambulatory Visit: Payer: Self-pay | Admitting: *Deleted

## 2020-05-05 ENCOUNTER — Inpatient Hospital Stay: Payer: Medicare Other | Attending: Oncology | Admitting: Oncology

## 2020-05-05 ENCOUNTER — Telehealth: Payer: Self-pay

## 2020-05-05 ENCOUNTER — Other Ambulatory Visit: Payer: Self-pay

## 2020-05-05 VITALS — BP 134/72 | HR 101 | Temp 99.1°F | Resp 18 | Ht 68.0 in

## 2020-05-05 DIAGNOSIS — E039 Hypothyroidism, unspecified: Secondary | ICD-10-CM | POA: Diagnosis not present

## 2020-05-05 DIAGNOSIS — M25569 Pain in unspecified knee: Secondary | ICD-10-CM | POA: Insufficient documentation

## 2020-05-05 DIAGNOSIS — Z86711 Personal history of pulmonary embolism: Secondary | ICD-10-CM | POA: Diagnosis not present

## 2020-05-05 DIAGNOSIS — Z79899 Other long term (current) drug therapy: Secondary | ICD-10-CM | POA: Insufficient documentation

## 2020-05-05 DIAGNOSIS — Z853 Personal history of malignant neoplasm of breast: Secondary | ICD-10-CM | POA: Diagnosis not present

## 2020-05-05 DIAGNOSIS — E785 Hyperlipidemia, unspecified: Secondary | ICD-10-CM | POA: Diagnosis not present

## 2020-05-05 DIAGNOSIS — Z1501 Genetic susceptibility to malignant neoplasm of breast: Secondary | ICD-10-CM | POA: Insufficient documentation

## 2020-05-05 DIAGNOSIS — D72829 Elevated white blood cell count, unspecified: Secondary | ICD-10-CM

## 2020-05-05 DIAGNOSIS — Z809 Family history of malignant neoplasm, unspecified: Secondary | ICD-10-CM | POA: Diagnosis not present

## 2020-05-05 LAB — CBC WITH DIFFERENTIAL (CANCER CENTER ONLY)
Abs Immature Granulocytes: 0.08 10*3/uL — ABNORMAL HIGH (ref 0.00–0.07)
Basophils Absolute: 0.1 10*3/uL (ref 0.0–0.1)
Basophils Relative: 0 %
Eosinophils Absolute: 0 10*3/uL (ref 0.0–0.5)
Eosinophils Relative: 0 %
HCT: 43.9 % (ref 36.0–46.0)
Hemoglobin: 15 g/dL (ref 12.0–15.0)
Immature Granulocytes: 1 %
Lymphocytes Relative: 16 %
Lymphs Abs: 2.2 10*3/uL (ref 0.7–4.0)
MCH: 30.5 pg (ref 26.0–34.0)
MCHC: 34.2 g/dL (ref 30.0–36.0)
MCV: 89.2 fL (ref 80.0–100.0)
Monocytes Absolute: 0.9 10*3/uL (ref 0.1–1.0)
Monocytes Relative: 7 %
Neutro Abs: 10.4 10*3/uL — ABNORMAL HIGH (ref 1.7–7.7)
Neutrophils Relative %: 76 %
Platelet Count: 284 10*3/uL (ref 150–400)
RBC: 4.92 MIL/uL (ref 3.87–5.11)
RDW: 12.6 % (ref 11.5–15.5)
WBC Count: 13.7 10*3/uL — ABNORMAL HIGH (ref 4.0–10.5)
nRBC: 0 % (ref 0.0–0.2)

## 2020-05-05 MED ORDER — LEVOTHYROXINE SODIUM 100 MCG PO TABS: 100.0000 ug | ORAL_TABLET | Freq: Every day | ORAL | 1 refills | Status: DC

## 2020-05-05 NOTE — Telephone Encounter (Signed)
Scheduled follow-up appointment per 2/14 los. Patient is aware. °

## 2020-05-05 NOTE — Progress Notes (Signed)
  Angela [name] OFFICE PROGRESS NOTE   Diagnosis: Leukocytosis, history of breast cancer  INTERVAL HISTORY:   Mr. Angela [name] returns as scheduled.  She continues to have knee pain.  She last had a steroid injection 2 weeks ago.  She has discomfort at the left breast implant.  She has not had a mammogram in 3 years.  She says that she is unable to have a mammogram secondary to the breast implants.  Objective:  Vital signs in last 24 hours:  Blood pressure 134/72, pulse (!) 101, temperature 99.1 F (37.3 C), temperature source Tympanic, resp. rate 18, height $RemoveBe'5\' 8"'saIBdYdqD$  (1.727 m), SpO2 97 %.     Lymphatics: No cervical, supraclavicular, or axillary nodes Resp: Lungs clear bilaterally Cardio: Regular rate and rhythm GI: No hepatosplenomegaly Vascular: The right lower leg is larger than the left side, no edema Breast: Status post left mastectomy with an implant in place.  No evidence for chest wall tumor recurrence.  Right breast implant.  Right breast without mass.   Lab Results:  Lab Results  Component Value Date   WBC 14.3 (H) 09/04/2019   HGB 14.4 09/04/2019   HCT 42.9 09/04/2019   MCV 90.3 09/04/2019   PLT 250.0 09/04/2019   NEUTROABS 10.5 (H) 09/04/2019    CMP  Lab Results  Component Value Date   NA 138 12/18/2019   K 4.0 12/18/2019   CL 99 12/18/2019   CO2 33 (H) 12/18/2019   GLUCOSE 127 (H) 12/18/2019   BUN 20 12/18/2019   CREATININE 0.69 12/18/2019   CALCIUM 9.0 12/18/2019   PROT 6.7 12/18/2019   ALBUMIN 3.9 12/18/2019   AST 11 12/18/2019   ALT 12 12/18/2019   ALKPHOS 129 (H) 12/18/2019   BILITOT 0.6 12/18/2019   GFRNONAA >60 05/25/2018   GFRAA >60 05/25/2018    Medications: I have reviewed the patient's current medications.   Assessment/Plan: 1. Leukocytosis-mild neutrophilia 2. Knee arthritis 3. BRCA2 carrier 4. Remote history of breast cancer 5. Hypothyroidism 6. Hyperlipidemia 7. Family history of multiple cancers 8. Bilateral  pulmonary embolism June 2017-maintained on Apixaban    Disposition: [name] appears unchanged.  She will return to the lab for a CBC for follow-up of leukocytosis.  I suspect the white count may be elevated secondary to a recent steroid injection.  She will return for an office visit in 1 year.  Angela Coder, MD  05/05/2020  10:46 AM

## 2020-05-05 NOTE — Telephone Encounter (Signed)
-----   Message from Ladell Pier, MD sent at 05/05/2020  1:51 PM EST ----- Please call patient, white cell count is still mildly elevated, may be related to steroid injections, follow-up as scheduled

## 2020-05-05 NOTE — Telephone Encounter (Signed)
Spoke with pt per MD Benay Spice about white cell count. Pt verbalizes understanding and agrees with plan of care.

## 2020-05-09 ENCOUNTER — Ambulatory Visit: Payer: Medicare Other | Admitting: Family

## 2020-05-12 ENCOUNTER — Ambulatory Visit (INDEPENDENT_AMBULATORY_CARE_PROVIDER_SITE_OTHER): Payer: Medicare Other | Admitting: Family

## 2020-05-12 ENCOUNTER — Encounter: Payer: Self-pay | Admitting: Family

## 2020-05-12 ENCOUNTER — Other Ambulatory Visit: Payer: Self-pay

## 2020-05-12 VITALS — BP 138/90 | HR 80 | Temp 98.3°F | Resp 18 | Ht 68.0 in

## 2020-05-12 DIAGNOSIS — H6122 Impacted cerumen, left ear: Secondary | ICD-10-CM | POA: Diagnosis not present

## 2020-05-12 NOTE — Progress Notes (Signed)
Angela [name] Ward [name] is a 87 y.o. female with the following history as recorded in EpicCare:  Patient Active Problem List   Diagnosis Date Noted  . Sepsis ([place]) 05/22/2018  . Ascending aortic aneurysm ([place]) 01/13/2016  . Thoracic aortic atherosclerosis ([place]) 01/13/2016  . Pulmonary embolism without acute cor pulmonale ([place])   . Pulmonary emboli ([place]) 08/30/2015  . Acute massive pulmonary embolism ([place]) 08/30/2015  . Leukocytosis 12/23/2014  . BRCA2 positive   . Hypothyroidism (acquired) 12/04/2012  . Heart murmur 09/01/2012  . Chest pain 09/17/2010  . Hypertension   . Dyslipidemia   . Prediabetes   . Arthritis   . GERD (gastroesophageal reflux disease)     Current Outpatient Medications  Medication Sig Dispense Refill  . albuterol (VENTOLIN HFA) 108 (90 Base) MCG/ACT inhaler Inhale 2 puffs into the lungs 4 (four) times daily as needed for wheezing or shortness of breath. 18 g 1  . alendronate (FOSAMAX) 70 MG tablet TAKE 1 TAB ONCE A WEEK, AT LEAST 30 MIN BEFORE 1ST FOOD.DO NOT LIE DOWN FOR 30 MIN AFTER TAKING. 12 tablet 3  . benazepril-hydrochlorthiazide (LOTENSIN HCT) 20-12.5 MG tablet TAKE 1 TABLET ONCE DAILY. 90 tablet 3  . ELIQUIS 5 MG TABS tablet TAKE 1 TABLET BY MOUTH TWICE DAILY. 180 tablet 3  . furosemide (LASIX) 20 MG tablet Take 0.5-1 tablets (10-20 mg total) by mouth daily as needed for fluid or edema (If weight gain by 2lbs in 24 hours). 60 tablet 0  . isosorbide mononitrate (IMDUR) 30 MG 24 hr tablet TAKE 1 TABLET ONCE DAILY. 90 tablet 3  . levothyroxine (SYNTHROID) 100 MCG tablet Take 1 tablet (100 mcg total) by mouth daily before breakfast. 90 tablet 1  . loperamide (IMODIUM A-D) 2 MG tablet Take 1 tablet (2 mg total) by mouth 3 (three) times daily as needed for diarrhea or loose stools. 15 tablet 0  . LORazepam (ATIVAN) 0.5 MG tablet TAKE (1) TABLET TWICE DAILY AS NEEDED FOR ANXIETY. (Patient taking differently: Take 0.5 mg by mouth 2 (two) times daily as needed for  anxiety. TAKE (1) TABLET TWICE DAILY AS NEEDED FOR ANXIETY.) 60 tablet 0  . Multiple Vitamins-Minerals (ICAPS AREDS 2 PO) Take 1 tablet by mouth daily.     . ondansetron (ZOFRAN) 4 MG tablet TAKE 1 TABLET EVERY 8 HOURS AS NEEDED FOR NAUSEA AND VOMITING. 20 tablet 0  . pantoprazole (PROTONIX) 40 MG tablet TAKE 1 TABLET ONCE DAILY. 90 tablet 3  . PERCOCET 10-325 MG per tablet Take 0.25-1 tablets by mouth every 6 (six) hours as needed for pain.     . polyethylene glycol powder (GLYCOLAX/MIRALAX) 17 GM/SCOOP powder DISSOLVE ONE CAPFUL IN LIQUID AND DRINK 3 TIMES A DAY AS NEEDED FOR MODERATE CONSTIPATION. 1020 g 0  . potassium chloride (KLOR-CON) 10 MEQ tablet TAKE 1 TABLET ONCE DAILY. 90 tablet 3  . PRESCRIPTION MEDICATION Inject 1 application as directed every 30 (thirty) days. Cortisone injections, each knee    . Vitamin D, Ergocalciferol, (DRISDOL) 1.25 MG (50000 UNIT) CAPS capsule TAKE ONE CAPSULE ONCE A WEEK. 4 capsule 0  . VOLTAREN 1 % GEL Apply 2 g topically 4 (four) times daily as needed (pain). To knees     No current facility-administered medications for this visit.    Allergies: Crestor [rosuvastatin calcium], Gadolinium, Pravastatin, Simvastatin, and Penicillins  Past Medical History:  Diagnosis Date  . Arthritis   . BRCA2 positive 10/214  . Breast cancer ([place]) 1994   left sided cancer;  unilateral mastectomy, no chemo or radiation  . Diabetes mellitus   . Dyslipidemia   . GERD (gastroesophageal reflux disease)   . Heart murmur   . Hypertension     Past Surgical History:  Procedure Laterality Date  . CARDIAC CATHETERIZATION     NORMAL CORONARY ARTERIES  . CHOLECYSTECTOMY    . MASTECTOMY     LEFT BREAST  . TONSILLECTOMY      Family History  Problem Relation Age of Onset  . Heart attack Maternal Grandmother   . Pancreatic cancer Mother 87  . Bladder Cancer Father 57       heavy smoker and drinker  . Ovarian cancer Sister 63  . Hypertension Sister   . Lung cancer  Brother 38  . Ovarian cancer Maternal Aunt 62  . Stomach cancer Maternal Grandfather   . Bone cancer Paternal Grandmother        unsure if this was a primary cancer  . Stomach cancer Paternal Grandfather   . Lung cancer Maternal Aunt        died in her 63s; former smoker  . Breast cancer Cousin        dx in her late 69s to 42s; paternal cousin  . Heart disease Neg Hx     Social History   Tobacco Use  . Smoking status: Never Smoker  . Smokeless tobacco: Never Used  Substance Use Topics  . Alcohol use: No    Subjective:   Accompanied by her caregiver; concerned that hearing in left ear is "off." Symptoms x 2 months; no prior history of problems with ear wax;   Objective:  Vitals:   05/12/20 1114  BP: 138/90  Pulse: 80  Resp: 18  Temp: 98.3 F (36.8 C)  TempSrc: Oral  SpO2: 97%  Height: '5\' 8"'  (1.727 m)    General: Well developed, well nourished, in no acute distress  Skin : Warm and dry.  Head: Normocephalic and atraumatic  Eyes: Sclera and conjunctiva clear; pupils round and reactive to light; extraocular movements intact  Ears: cerumen impaction noted left ear; after lavage, external normal; canals clear; tympanic membranes normal  Oropharynx: Pink, supple. No suspicious lesions  Neck: Supple without thyromegaly, adenopathy  Lungs: Respirations unlabored;  Neurologic: Alert and oriented; speech intact; face symmetrical;    Assessment:  1. Impacted cerumen of left ear     Plan:  Ear lavage completed in office with no difficulty; if symptoms persist, she is to call back and will need to refer to ENT.  Patient is aware that I will be transferring to the [facility] office at the end of April. She will need to find a new PCP since she does not want to transfer with me.  This visit occurred during the SARS-CoV-2 public health emergency.  Safety protocols were in place, including screening questions prior to the visit, additional usage of staff PPE, and  extensive cleaning of exam room while observing appropriate contact time as indicated for disinfecting solutions.     No follow-ups on file.  No orders of the defined types were placed in this encounter.   Requested Prescriptions    No prescriptions requested or ordered in this encounter

## 2020-05-19 ENCOUNTER — Other Ambulatory Visit: Payer: Self-pay | Admitting: Family

## 2020-05-20 DIAGNOSIS — G894 Chronic pain syndrome: Secondary | ICD-10-CM | POA: Diagnosis not present

## 2020-05-21 ENCOUNTER — Other Ambulatory Visit: Payer: Self-pay | Admitting: Family

## 2020-06-01 DIAGNOSIS — G894 Chronic pain syndrome: Secondary | ICD-10-CM | POA: Diagnosis not present

## 2020-06-12 DIAGNOSIS — L602 Onychogryphosis: Secondary | ICD-10-CM | POA: Diagnosis not present

## 2020-06-12 DIAGNOSIS — L84 Corns and callosities: Secondary | ICD-10-CM | POA: Diagnosis not present

## 2020-06-12 DIAGNOSIS — I739 Peripheral vascular disease, unspecified: Secondary | ICD-10-CM | POA: Diagnosis not present

## 2020-06-13 ENCOUNTER — Ambulatory Visit (INDEPENDENT_AMBULATORY_CARE_PROVIDER_SITE_OTHER): Payer: Medicare Other

## 2020-06-13 ENCOUNTER — Other Ambulatory Visit: Payer: Self-pay

## 2020-06-13 VITALS — BP 120/70 | HR 94 | Temp 98.2°F | Ht 68.0 in | Wt 200.0 lb

## 2020-06-13 DIAGNOSIS — Z0000 Encounter for general adult medical examination without abnormal findings: Secondary | ICD-10-CM

## 2020-06-13 NOTE — Patient Instructions (Addendum)
[name] , Thank you for taking time to come for your Medicare Wellness Visit. I appreciate your ongoing commitment to your health goals. Please review the following plan we discussed and let me know if I can assist you in the future.   Screening recommendations/referrals: Colonoscopy: not a candidate for cancer screening due to age 87: done at OB/GYN office (Dr. Dory Horn) Bone Density: 05/17/2017; osteopenia (completed, no repeat) Recommended yearly ophthalmology/optometry visit for glaucoma screening and checkup Recommended yearly dental visit for hygiene and checkup  Vaccinations: Influenza vaccine: 12/20/2019 Pneumococcal vaccine: 10/12/2016, 01/31/2018 Tdap vaccine: declined Shingles vaccine: per patient, done at [facility]: 04/30/2019, 05/24/2019, 02/27/2020  Advanced directives: Please bring a copy of your health care power of attorney and living will to the office at your convenience.  Conditions/risks identified:  Yes; Reviewed health maintenance screenings with patient today and relevant education, vaccines, and/or referrals were provided. Please continue to do your personal lifestyle choices by: daily care of teeth and gums, regular physical activity (goal should be 5 days a week for 30 minutes), eat a healthy diet, avoid tobacco and drug use, limiting any alcohol intake, taking a low-dose aspirin (if not allergic or have been advised by your provider otherwise) and taking vitamins and minerals as recommended by your provider. Continue doing brain stimulating activities (puzzles, reading, adult coloring books, staying active) to keep memory sharp. Continue to eat heart healthy diet (full of fruits, vegetables, whole grains, lean protein, water--limit salt, fat, and sugar intake) and increase physical activity as tolerated.  Next appointment: Please schedule your next Medicare Wellness Visit with your Nurse Health Advisor in 1 year by calling  [phone].   Preventive Care 45 Years and Older, Female Preventive care refers to lifestyle choices and visits with your health care provider that can promote health and wellness. What does preventive care include?  A yearly physical exam. This is also called an annual well check.  Dental exams once or twice a year.  Routine eye exams. Ask your health care provider how often you should have your eyes checked.  Personal lifestyle choices, including:  Daily care of your teeth and gums.  Regular physical activity.  Eating a healthy diet.  Avoiding tobacco and drug use.  Limiting alcohol use.  Practicing safe sex.  Taking low-dose aspirin every day.  Taking vitamin and mineral supplements as recommended by your health care provider. What happens during an annual well check? The services and screenings done by your health care provider during your annual well check will depend on your age, overall health, lifestyle risk factors, and family history of disease. Counseling  Your health care provider may ask you questions about your:  Alcohol use.  Tobacco use.  Drug use.  Emotional well-being.  Home and relationship well-being.  Sexual activity.  Eating habits.  History of falls.  Memory and ability to understand (cognition).  Work and work Statistician.  Reproductive health. Screening  You may have the following tests or measurements:  Height, weight, and BMI.  Blood pressure.  Lipid and cholesterol levels. These may be checked every 5 years, or more frequently if you are over 86 years old.  Skin check.  Lung cancer screening. You may have this screening every year starting at age 36 if you have a 30-pack-year history of smoking and currently smoke or have quit within the past 15 years.  Fecal occult blood test (FOBT) of the stool. You may have this test every year starting at  age 85.  Flexible sigmoidoscopy or colonoscopy. You may have a  sigmoidoscopy every 5 years or a colonoscopy every 10 years starting at age 28.  Hepatitis C blood test.  Hepatitis B blood test.  Sexually transmitted disease (STD) testing.  Diabetes screening. This is done by checking your blood sugar (glucose) after you have not eaten for a while (fasting). You may have this done every 1-3 years.  Bone density scan. This is done to screen for osteoporosis. You may have this done starting at age 24.  Mammogram. This may be done every 1-2 years. Talk to your health care provider about how often you should have regular mammograms. Talk with your health care provider about your test results, treatment options, and if necessary, the need for more tests. Vaccines  Your health care provider may recommend certain vaccines, such as:  Influenza vaccine. This is recommended every year.  Tetanus, diphtheria, and acellular pertussis (Tdap, Td) vaccine. You may need a Td booster every 10 years.  Zoster vaccine. You may need this after age 51.  Pneumococcal 13-valent conjugate (PCV13) vaccine. One dose is recommended after age 34.  Pneumococcal polysaccharide (PPSV23) vaccine. One dose is recommended after age 73. Talk to your health care provider about which screenings and vaccines you need and how often you need them. This information is not intended to replace advice given to you by your health care provider. Make sure you discuss any questions you have with your health care provider. Document Released: 04/04/2015 Document Revised: 11/26/2015 Document Reviewed: 01/07/2015 Elsevier Interactive Patient Education  2017 [place] Prevention in the Home Falls can cause injuries. They can happen to people of all ages. There are many things you can do to make your home safe and to help prevent falls. What can I do on the outside of my home?  Regularly fix the edges of walkways and driveways and fix any cracks.  Remove anything that might make you  trip as you walk through a door, such as a raised step or threshold.  Trim any bushes or trees on the path to your home.  Use bright outdoor lighting.  Clear any walking paths of anything that might make someone trip, such as rocks or tools.  Regularly check to see if handrails are loose or broken. Make sure that both sides of any steps have handrails.  Any raised decks and porches should have guardrails on the edges.  Have any leaves, snow, or ice cleared regularly.  Use sand or salt on walking paths during winter.  Clean up any spills in your garage right away. This includes oil or grease spills. What can I do in the bathroom?  Use night lights.  Install grab bars by the toilet and in the tub and shower. Do not use towel bars as grab bars.  Use non-skid mats or decals in the tub or shower.  If you need to sit down in the shower, use a plastic, non-slip stool.  Keep the floor dry. Clean up any water that spills on the floor as soon as it happens.  Remove soap buildup in the tub or shower regularly.  Attach bath mats securely with double-sided non-slip rug tape.  Do not have throw rugs and other things on the floor that can make you trip. What can I do in the bedroom?  Use night lights.  Make sure that you have a light by your bed that is easy to reach.  Do not use any  sheets or blankets that are too big for your bed. They should not hang down onto the floor.  Have a firm chair that has side arms. You can use this for support while you get dressed.  Do not have throw rugs and other things on the floor that can make you trip. What can I do in the kitchen?  Clean up any spills right away.  Avoid walking on wet floors.  Keep items that you use a lot in easy-to-reach places.  If you need to reach something above you, use a strong step stool that has a grab bar.  Keep electrical cords out of the way.  Do not use floor polish or wax that makes floors slippery. If  you must use wax, use non-skid floor wax.  Do not have throw rugs and other things on the floor that can make you trip. What can I do with my stairs?  Do not leave any items on the stairs.  Make sure that there are handrails on both sides of the stairs and use them. Fix handrails that are broken or loose. Make sure that handrails are as long as the stairways.  Check any carpeting to make sure that it is firmly attached to the stairs. Fix any carpet that is loose or worn.  Avoid having throw rugs at the top or bottom of the stairs. If you do have throw rugs, attach them to the floor with carpet tape.  Make sure that you have a light switch at the top of the stairs and the bottom of the stairs. If you do not have them, ask someone to add them for you. What else can I do to help prevent falls?  Wear shoes that:  Do not have high heels.  Have rubber bottoms.  Are comfortable and fit you well.  Are closed at the toe. Do not wear sandals.  If you use a stepladder:  Make sure that it is fully opened. Do not climb a closed stepladder.  Make sure that both sides of the stepladder are locked into place.  Ask someone to hold it for you, if possible.  Clearly mark and make sure that you can see:  Any grab bars or handrails.  First and last steps.  Where the edge of each step is.  Use tools that help you move around (mobility aids) if they are needed. These include:  Canes.  Walkers.  Scooters.  Crutches.  Turn on the lights when you go into a dark area. Replace any light bulbs as soon as they burn out.  Set up your furniture so you have a clear path. Avoid moving your furniture around.  If any of your floors are uneven, fix them.  If there are any pets around you, be aware of where they are.  Review your medicines with your doctor. Some medicines can make you feel dizzy. This can increase your chance of falling. Ask your doctor what other things that you can do to  help prevent falls. This information is not intended to replace advice given to you by your health care provider. Make sure you discuss any questions you have with your health care provider. Document Released: 01/02/2009 Document Revised: 08/14/2015 Document Reviewed: 04/12/2014 Elsevier Interactive Patient Education  2017 [employer].

## 2020-06-13 NOTE — Progress Notes (Signed)
Subjective:   Angela [name] is a 87 y.o. female who presents for Medicare Annual (Subsequent) preventive examination.  Review of Systems    No ROS. Medicare Wellness Visit. Additional risk factors are reflected in social history. Cardiac Risk Factors include: dyslipidemia;family history of premature cardiovascular disease;hypertension;obesity (BMI >30kg/m2)     Objective:    Today's Vitals   06/13/20 0940  BP: 120/70  Pulse: 94  Temp: 98.2 F (36.8 C)  SpO2: 97%  Weight: 200 lb (90.7 kg)  Height: '5\' 8"'  (1.727 m)  PainSc: 0-No pain   Body mass index is 30.41 kg/m.  Advanced Directives 06/13/2020 04/10/2019 05/22/2018 05/22/2018 01/09/2017 12/23/2016 06/22/2016  Does Patient Have a Medical Advance Directive? Yes Yes Yes Yes No No No  Type of Advance Directive Living will [place];Living will [place];Living will [place];Living will - - -  Does patient want to make changes to medical advance directive? No - Patient declined No - Patient declined No - Patient declined - - - -  Copy of [place] in Chart? - No - copy requested No - copy requested No - copy requested - - -  Would patient like information on creating a medical advance directive? - - - - No - Patient declined No - Patient declined No - Patient declined    Current Medications (verified) Outpatient Encounter Medications as of 06/13/2020  Medication Sig  . albuterol (VENTOLIN HFA) 108 (90 Base) MCG/ACT inhaler Inhale 2 puffs into the lungs 4 (four) times daily as needed for wheezing or shortness of breath.  Marland Kitchen alendronate (FOSAMAX) 70 MG tablet TAKE 1 TAB ONCE A WEEK, AT LEAST 30 MIN BEFORE 1ST FOOD.DO NOT LIE DOWN FOR 30 MIN AFTER TAKING.  . benazepril-hydrochlorthiazide (LOTENSIN HCT) 20-12.5 MG tablet TAKE 1 TABLET ONCE DAILY.  Marland Kitchen ELIQUIS 5 MG TABS tablet TAKE 1 TABLET BY MOUTH TWICE DAILY.  . furosemide (LASIX) 20 MG tablet Take 0.5-1  tablets (10-20 mg total) by mouth daily as needed for fluid or edema (If weight gain by 2lbs in 24 hours).  . isosorbide mononitrate (IMDUR) 30 MG 24 hr tablet TAKE 1 TABLET ONCE DAILY.  Marland Kitchen levothyroxine (SYNTHROID) 100 MCG tablet Take 1 tablet (100 mcg total) by mouth daily before breakfast.  . loperamide (IMODIUM A-D) 2 MG tablet Take 1 tablet (2 mg total) by mouth 3 (three) times daily as needed for diarrhea or loose stools.  Marland Kitchen LORazepam (ATIVAN) 0.5 MG tablet TAKE (1) TABLET TWICE DAILY AS NEEDED FOR ANXIETY. (Patient taking differently: Take 0.5 mg by mouth 2 (two) times daily as needed for anxiety. TAKE (1) TABLET TWICE DAILY AS NEEDED FOR ANXIETY.)  . Multiple Vitamins-Minerals (ICAPS AREDS 2 PO) Take 1 tablet by mouth daily.   . ondansetron (ZOFRAN) 4 MG tablet TAKE 1 TABLET EVERY 8 HOURS AS NEEDED FOR NAUSEA AND VOMITING.  . pantoprazole (PROTONIX) 40 MG tablet TAKE 1 TABLET ONCE DAILY.  Marland Kitchen PERCOCET 10-325 MG per tablet Take 0.25-1 tablets by mouth every 6 (six) hours as needed for pain.   . polyethylene glycol powder (GLYCOLAX/MIRALAX) 17 GM/SCOOP powder DISSOLVE ONE CAPFUL IN LIQUID AND DRINK 3 TIMES A DAY AS NEEDED FOR MODERATE CONSTIPATION.  Marland Kitchen potassium chloride (KLOR-CON) 10 MEQ tablet TAKE 1 TABLET ONCE DAILY.  Marland Kitchen PRESCRIPTION MEDICATION Inject 1 application as directed every 30 (thirty) days. Cortisone injections, each knee  . Vitamin D, Ergocalciferol, (DRISDOL) 1.25 MG (50000 UNIT) CAPS capsule  TAKE ONE CAPSULE ONCE A WEEK.  . VOLTAREN 1 % GEL Apply 2 g topically 4 (four) times daily as needed (pain). To knees   No facility-administered encounter medications on file as of 06/13/2020.    Allergies (verified) Crestor [rosuvastatin calcium], Gadolinium, Pravastatin, Simvastatin, and Penicillins   History: Past Medical History:  Diagnosis Date  . Arthritis   . BRCA2 positive 10/214  . Breast cancer ([place]) 1994   left sided cancer; unilateral mastectomy, no chemo or radiation  .  Diabetes mellitus   . Dyslipidemia   . GERD (gastroesophageal reflux disease)   . Heart murmur   . Hypertension    Past Surgical History:  Procedure Laterality Date  . CARDIAC CATHETERIZATION     NORMAL CORONARY ARTERIES  . CHOLECYSTECTOMY    . MASTECTOMY     LEFT BREAST  . TONSILLECTOMY     Family History  Problem Relation Age of Onset  . Heart attack Maternal Grandmother   . Pancreatic cancer Mother 27  . Bladder Cancer Father 59       heavy smoker and drinker  . Ovarian cancer Sister 31  . Hypertension Sister   . Lung cancer Brother 56  . Ovarian cancer Maternal Aunt 62  . Stomach cancer Maternal Grandfather   . Bone cancer Paternal Grandmother        unsure if this was a primary cancer  . Stomach cancer Paternal Grandfather   . Lung cancer Maternal Aunt        died in her 39s; former smoker  . Breast cancer Cousin        dx in her late 97s to 24s; paternal cousin  . Heart disease Neg Hx    Social History   Socioeconomic History  . Marital status: Widowed    Spouse name: Not on file  . Number of children: 1  . Years of education: Not on file  . Highest education level: Not on file  Occupational History  . Not on file  Tobacco Use  . Smoking status: Never Smoker  . Smokeless tobacco: Never Used  Substance and Sexual Activity  . Alcohol use: No  . Drug use: No  . Sexual activity: Not on file  Other Topics Concern  . Not on file  Social History Narrative   Lives alone in 2 story apartment home   Widowed several years--was homemaker most of life   Drives & independent   Has worked in office environment and modeled in past   Social Determinants of Radio broadcast assistant Strain: [place]   . Difficulty of Paying Living Expenses: Not hard at all  Food Insecurity: No Food Insecurity  . Worried About Charity fundraiser in the Last Year: Never true  . Ran Out of Food in the Last Year: Never true  Transportation Needs: No Transportation Needs  . Lack  of Transportation (Medical): No  . Lack of Transportation (Non-Medical): No  Physical Activity: Inactive  . Days of Exercise per Week: 0 days  . Minutes of Exercise per Session: 0 min  Stress: No Stress Concern Present  . Feeling of Stress : Not at all  Social Connections: Not on file    Tobacco Counseling Counseling given: Not Answered   Clinical Intake:  Pre-visit preparation completed: Yes  Pain : No/denies pain Pain Score: 0-No pain     BMI - recorded: 30.41 Nutritional Status: BMI > 30  Obese Nutritional Risks: None Diabetes: No  How often do  you need to have someone help you when you read instructions, pamphlets, or other written materials from your doctor or pharmacy?: 1 - Never What is the last grade level you completed in school?: 1.5 years of college  Diabetic? no  Interpreter Needed?: No  Information entered by :: Lisette Abu, LPN   Activities of Daily Living In your present state of health, do you have any difficulty performing the following activities: 06/13/2020 01/30/2020  Hearing? N N  Vision? Y Y  Difficulty concentrating or making decisions? Y N  Walking or climbing stairs? Y N  Comment has a stair lift -  Dressing or bathing? Y N  Doing errands, shopping? Y N  Preparing Food and eating ? Y -  Using the Toilet? N -  In the past six months, have you accidently leaked urine? N -  Do you have problems with loss of bowel control? N -  Managing your Medications? N -  Managing your Finances? N -  Housekeeping or managing your Housekeeping? Y -  Some recent data might be hidden    Patient Care Team: Marrian Salvage, [place] as PCP - General (Internal Medicine)  Indicate any recent Medical Services you may have received from other than [facility] providers in the past year (date may be approximate).     Assessment:   This is a routine wellness examination for [facility].  Hearing/Vision screen No exam data present  Dietary issues and exercise  activities discussed: Current Exercise Habits: The patient does not participate in regular exercise at present, Exercise limited by: None identified  Goals   None    Depression Screen PHQ 2/9 Scores 06/13/2020 01/30/2020 08/29/2014  PHQ - 2 Score 0 0 1    Fall Risk Fall Risk  06/13/2020 01/30/2020 08/29/2014 02/28/2014  Falls in the past year? 0 0 No No  Number falls in past yr: 0 0 - -  Injury with Fall? 0 0 - -  Risk for fall due to : No Fall Risks - - Impaired balance/gait;Impaired mobility;Medication side effect  Risk for fall due to: Comment - - - Fall Risk-cane, narcotic use, painful knees  Follow up Falls evaluation completed - - -    FALL RISK PREVENTION PERTAINING TO THE HOME:  Any stairs in or around the home? Yes  If so, are there any without handrails? No  Home free of loose throw rugs in walkways, pet beds, electrical cords, etc? Yes  Adequate lighting in your home to reduce risk of falls? Yes   ASSISTIVE DEVICES UTILIZED TO PREVENT FALLS:  Life alert? No  Use of a cane, walker or w/c? Yes  Grab bars in the bathroom? Yes  Shower chair or bench in shower? Yes  Elevated toilet seat or a handicapped toilet? Yes   TIMED UP AND GO:  Was the test performed? No .  Length of time to ambulate 10 feet: 0 sec.   Gait steady and fast with assistive device  Cognitive Function: Normal cognitive status assessed by direct observation by this Nurse Health Advisor. No abnormalities found.         Immunizations Immunization History  Administered Date(s) Administered  . Fluad Quad(high Dose 65+) 12/27/2018, 12/20/2019  . Influenza, High Dose Seasonal PF 12/30/2015, 12/27/2017  . Influenza,inj,Quad PF,6+ Mos 12/11/2012, 01/28/2015  . Influenza-Unspecified 11/20/2013  . PFIZER(Purple Top)SARS-COV-2 Vaccination 04/30/2019, 05/24/2019, 03/18/2020  . Pneumococcal Conjugate-13 01/31/2018  . Pneumococcal Polysaccharide-23 06/20/2007, 10/12/2016  . Zoster 02/03/2011    TDAP  status: Due,  Education has been provided regarding the importance of this vaccine. Advised may receive this vaccine at local pharmacy or Health Dept. Aware to provide a copy of the vaccination record if obtained from local pharmacy or Health Dept. Verbalized acceptance and understanding.  Flu Vaccine status: Up to date  Pneumococcal vaccine status: Up to date  Covid-19 vaccine status: Completed vaccines  Qualifies for Shingles Vaccine? Yes   Zostavax completed Yes   Shingrix Completed?: No.    Education has been provided regarding the importance of this vaccine. Patient has been advised to call insurance company to determine out of pocket expense if they have not yet received this vaccine. Advised may also receive vaccine at local pharmacy or Health Dept. Verbalized acceptance and understanding. (verified with [facility])  Screening Tests Health Maintenance  Topic Date Due  . FOOT EXAM  Never done  . TETANUS/TDAP  Never done  . OPHTHALMOLOGY EXAM  03/20/2013  . HEMOGLOBIN A1C  01/02/2020  . INFLUENZA VACCINE  Completed  . DEXA SCAN  Completed  . COVID-19 Vaccine  Completed  . PNA vac Low Risk Adult  Completed  . HPV VACCINES  Aged Out    Health Maintenance  Health Maintenance Due  Topic Date Due  . FOOT EXAM  Never done  . TETANUS/TDAP  Never done  . OPHTHALMOLOGY EXAM  03/20/2013  . HEMOGLOBIN A1C  01/02/2020    Colorectal cancer screening: No longer required.   Mammogram status: No longer required due to age.  Bone Density status: Completed 05/17/2017. Results reflect: Bone density results: OSTEOPENIA. Repeat every 0 years. (Completed)  Lung Cancer Screening: (Low Dose CT Chest recommended if Age 79-80 years, 30 pack-year currently smoking OR have quit w/in 15years.) does not qualify.   Lung Cancer Screening Referral: no  Additional Screening:  Hepatitis C Screening: does not qualify; Completed: no  Vision Screening: Recommended annual ophthalmology exams  for early detection of glaucoma and other disorders of the eye. Is the patient up to date with their annual eye exam?  Yes  Who is the provider or what is the name of the office in which the patient attends annual eye exams? Princess Bruins, MD. If pt is not established with a provider, would they like to be referred to a provider to establish care? No .   Dental Screening: Recommended annual dental exams for proper oral hygiene  Community Resource Referral / Chronic Care Management: CRR required this visit?  No   CCM required this visit?  No      Plan:     I have personally reviewed and noted the following in the patient's chart:   . Medical and social history . Use of alcohol, tobacco or illicit drugs  . Current medications and supplements . Functional ability and status . Nutritional status . Physical activity . Advanced directives . List of other physicians . Hospitalizations, surgeries, and ER visits in previous 12 months . Vitals . Screenings to include cognitive, depression, and falls . Referrals and appointments  In addition, I have reviewed and discussed with patient certain preventive protocols, quality metrics, and best practice recommendations. A written personalized care plan for preventive services as well as general preventive health recommendations were provided to patient.     Sheral Flow, LPN   [id number]   Nurse Notes:  Medications reviewed with patient; yes there is opioid use noted.

## 2020-06-16 DIAGNOSIS — H353112 Nonexudative age-related macular degeneration, right eye, intermediate dry stage: Secondary | ICD-10-CM | POA: Diagnosis not present

## 2020-06-16 DIAGNOSIS — H353124 Nonexudative age-related macular degeneration, left eye, advanced atrophic with subfoveal involvement: Secondary | ICD-10-CM | POA: Diagnosis not present

## 2020-06-16 DIAGNOSIS — H35033 Hypertensive retinopathy, bilateral: Secondary | ICD-10-CM | POA: Diagnosis not present

## 2020-06-16 DIAGNOSIS — H35373 Puckering of macula, bilateral: Secondary | ICD-10-CM | POA: Diagnosis not present

## 2020-06-17 DIAGNOSIS — G894 Chronic pain syndrome: Secondary | ICD-10-CM | POA: Diagnosis not present

## 2020-06-20 ENCOUNTER — Other Ambulatory Visit: Payer: Self-pay | Admitting: Family

## 2020-07-01 DIAGNOSIS — M170 Bilateral primary osteoarthritis of knee: Secondary | ICD-10-CM | POA: Diagnosis not present

## 2020-07-01 DIAGNOSIS — G894 Chronic pain syndrome: Secondary | ICD-10-CM | POA: Diagnosis not present

## 2020-07-08 ENCOUNTER — Encounter: Payer: Self-pay | Admitting: Endocrinology

## 2020-07-08 ENCOUNTER — Other Ambulatory Visit: Payer: Self-pay

## 2020-07-08 ENCOUNTER — Ambulatory Visit (INDEPENDENT_AMBULATORY_CARE_PROVIDER_SITE_OTHER): Payer: Medicare Other | Admitting: Endocrinology

## 2020-07-08 VITALS — BP 142/68 | HR 95

## 2020-07-08 DIAGNOSIS — M810 Age-related osteoporosis without current pathological fracture: Secondary | ICD-10-CM

## 2020-07-08 DIAGNOSIS — R7303 Prediabetes: Secondary | ICD-10-CM | POA: Diagnosis not present

## 2020-07-08 DIAGNOSIS — E039 Hypothyroidism, unspecified: Secondary | ICD-10-CM

## 2020-07-08 DIAGNOSIS — I10 Essential (primary) hypertension: Secondary | ICD-10-CM | POA: Diagnosis not present

## 2020-07-08 LAB — POCT GLUCOSE (DEVICE FOR HOME USE): POC Glucose: 152 mg/dl — AB (ref 70–99)

## 2020-07-08 MED ORDER — SYNTHROID 100 MCG PO TABS: 100.0000 ug | ORAL_TABLET | Freq: Every day | ORAL | 1 refills | Status: DC

## 2020-07-08 MED ORDER — LORAZEPAM 0.5 MG PO TABS: ORAL_TABLET | ORAL | 0 refills | Status: DC

## 2020-07-08 NOTE — Patient Instructions (Signed)
Take Fosamax weekly

## 2020-07-08 NOTE — Progress Notes (Signed)
Subjective:     Patient ID: Angela [name], female   DOB: [birth year], 87 y.o.   MRN: [id number]  HPI  Reason for visit: Follow-up of hypothyroidism and other problems   Hypertension   Has been on Lotensin HCT 20/12.5 long-term, this has been a consistent dose  Does not monitor blood pressure at home Blood pressure relatively higher today but she is anxious  BP Readings from Last 3 Encounters:  07/08/20 (!) 142/68  06/13/20 120/70  05/12/20 138/90   She gets occasional edema, has been given Lasix to use if needed  PREDIABETES: She has had long-standing impaired fasting glucose  A1c has been consistently normal and below 6% usually  In the past had been treated with metformin  Periodically checks home blood sugars also which are normal usually  Most recent glucose at home was 150 but she may have had at breakfast, also today blood sugar 152 after breakfast  Labs as follows    Lab Results  Component Value Date   HGBA1C 5.7 07/03/2019   HGBA1C 5.7 03/02/2019   HGBA1C 5.9 10/27/2018   Lab Results  Component Value Date   MICROALBUR 0.4 12/05/2012   LDLCALC 152 (H) 07/03/2019   CREATININE 0.69 12/18/2019    OTHER active problems discussed today including hypothyroidism are detailed in review of systems:   Allergies as of 07/08/2020      Reactions   Crestor [rosuvastatin Calcium] Anaphylaxis, Other (See Comments)   Abdominal discomfort   Gadolinium Nausea And Vomiting    pt states she had nausea after receiving MAGNEVIST for breast imaging   Pravastatin Other (See Comments)   Locked jaw, weakness   Simvastatin Other (See Comments)   Weakness and locked jaw.   Penicillins Swelling, Rash   Patient tolerates amoxicillin Has patient had a PCN reaction causing immediate rash, facial/tongue/throat swelling, SOB or lightheadedness with hypotension: yes Has patient had a PCN reaction causing severe rash involving mucus membranes or skin necrosis:  unknown Has patient had a PCN reaction that required hospitalization: unknown Has patient had a PCN reaction occurring within the last 10 years: no If all of the above answers are "NO", then may proceed with Cephalosporin use.      Medication List       Accurate as of July 08, 2020 10:54 AM. If you have any questions, ask your nurse or doctor.        STOP taking these medications   levothyroxine 100 MCG tablet Commonly known as: SYNTHROID Replaced by: Synthroid 100 MCG tablet Stopped by: [name], MD     TAKE these medications   albuterol 108 (90 Base) MCG/ACT inhaler Commonly known as: Ventolin HFA Inhale 2 puffs into the lungs 4 (four) times daily as needed for wheezing or shortness of breath.   alendronate 70 MG tablet Commonly known as: FOSAMAX TAKE 1 TAB ONCE A WEEK, AT LEAST 30 MIN BEFORE 1ST FOOD.DO NOT LIE DOWN FOR 30 MIN AFTER TAKING.   benazepril-hydrochlorthiazide 20-12.5 MG tablet Commonly known as: LOTENSIN HCT TAKE 1 TABLET ONCE DAILY.   Eliquis 5 MG Tabs tablet Generic drug: apixaban TAKE 1 TABLET BY MOUTH TWICE DAILY.   furosemide 20 MG tablet Commonly known as: LASIX Take 0.5-1 tablets (10-20 mg total) by mouth daily as needed for fluid or edema (If weight gain by 2lbs in 24 hours).   ICAPS AREDS 2 PO Take 1 tablet by mouth daily.   isosorbide mononitrate 30 MG 24 hr tablet Commonly  known as: IMDUR TAKE 1 TABLET ONCE DAILY.   loperamide 2 MG tablet Commonly known as: Imodium A-D Take 1 tablet (2 mg total) by mouth 3 (three) times daily as needed for diarrhea or loose stools.   LORazepam 0.5 MG tablet Commonly known as: ATIVAN TAKE (1) TABLET TWICE DAILY AS NEEDED FOR ANXIETY. What changed: See the new instructions.   ondansetron 4 MG tablet Commonly known as: ZOFRAN TAKE 1 TABLET EVERY 8 HOURS AS NEEDED FOR NAUSEA AND VOMITING.   pantoprazole 40 MG tablet Commonly known as: PROTONIX TAKE 1 TABLET ONCE DAILY.   PEG 3350 17 GM/SCOOP  Powd DISSOLVE ONE CAPFUL IN LIQUID AND DRINK 3 TIMES A DAY AS NEEDED FOR MODERATE CONSTIPATION.   Percocet 10-325 MG tablet Generic drug: oxyCODONE-acetaminophen Take 0.25-1 tablets by mouth every 6 (six) hours as needed for pain.   potassium chloride 10 MEQ tablet Commonly known as: KLOR-CON TAKE 1 TABLET ONCE DAILY.   PRESCRIPTION MEDICATION Inject 1 application as directed every 30 (thirty) days. Cortisone injections, each knee   Synthroid 100 MCG tablet Generic drug: levothyroxine Take 1 tablet (100 mcg total) by mouth daily before breakfast. Replaces: levothyroxine 100 MCG tablet Started by: [name], MD   Vitamin D (Ergocalciferol) 1.25 MG (50000 UNIT) Caps capsule Commonly known as: DRISDOL TAKE ONE CAPSULE ONCE A WEEK.   Voltaren 1 % Gel Generic drug: diclofenac Sodium Apply 2 g topically 4 (four) times daily as needed (pain). To knees       Review of Systems   OSTEOPOROSIS:  She had developed a vertebral fracture late 2018; she  also developed rib fractures in the mid 2018 X-ray report as follows: Vertebral augmentation at T12. A mild T7 compression deformity is new since June. A mild to moderate T9 compression deformity is not significantly changed. Bone density report not available from orthopedic surgeon  Previously had a course of calcitonin that was scribed in 01/2017 with relief of her pain  She had been persistently reluctant to do Prolia injection despite repeated explanations and medication being approved by her insurance  She did start taking Fosamax after her visit in 12/20, this is preferred over Actonel by her insurance No side effects with this but she says she stopped it about a month ago she did not feel it was helping   Primary hypothyroidism: She has had long-standing primary hypothyroidism.   Previously had been taking 125 mcg generic levothyroxine since 06/2017 as a stable dose   However because of her low TSH levels has been  progressively getting lower doses and since 2021 only 100 mcg daily Not clear if she was doing better with brand-name Synthroid however has been on generic for some time  She thinks that with the generic levothyroxine for last couple of months she has had more generalized aches, some nausea with taking the medicine and she thinks she will do better with brand-name  She has not taken any supplement for the last 3 days No recent labs available  TSH levels:   Lab Results  Component Value Date   TSH 0.72 12/18/2019   TSH 0.11 (L) 09/04/2019   TSH 0.32 (L) 07/03/2019   FREET4 1.16 12/18/2019   FREET4 1.59 09/04/2019   FREET4 1.54 07/03/2019    HYPERLIPIDEMIA: not on a statin drug because of patient's refusal to take medications for fear of having pains, cramps, hair loss or various other possible side effects  Her LDL has been between 130 and 160 more recently  Lab Results  Component Value Date   CHOL 217 (H) 07/03/2019   CHOL 206 (H) 03/02/2019   CHOL 196 10/27/2018   Lab Results  Component Value Date   HDL 44.20 07/03/2019   HDL 33.50 (L) 03/02/2019   HDL 38.10 (L) 10/27/2018   Lab Results  Component Value Date   LDLCALC 152 (H) 07/03/2019   LDLCALC 140 (H) 03/02/2019   LDLCALC 130 (H) 10/27/2018   Lab Results  Component Value Date   TRIG 102.0 07/03/2019   TRIG 164.0 (H) 03/02/2019   TRIG 144.0 10/27/2018   Lab Results  Component Value Date   CHOLHDL 5 07/03/2019   CHOLHDL 6 03/02/2019   CHOLHDL 5 10/27/2018   Lab Results  Component Value Date   LDLDIRECT 193.5 02/07/2013   LDLDIRECT 189.5 12/11/2012   LDLDIRECT 201.3 12/05/2012      LEUKOCYTOSIS:  She has periodically high white blood cell count without abnormal morphology, higher after steroid injections  Is seeing the hematologist regularly  Lab Results  Component Value Date   WBC 13.7 (H) 05/05/2020   WBC 14.3 (H) 09/04/2019   WBC 10.5 07/03/2019   WBC 10.2 04/10/2019   She takes small  doses of Percocet for knee pain, followed by pain clinic   Prior history of atypical chest pain: No recurrence  She says she has more anxiety and she wants to refill her Ativan She is due to see a new PCP     Objective:   Physical Exam  BP (!) 142/68   Pulse 95   SpO2 98% Weight refused       Assessment:      OSTEOPOROSIS with history of fracture:  She has been off her Fosamax started about 2 years ago Reassured her that this is safe to take and will provide long-term benefits  Encourage her to stay on this for up to 5 years  Also on prescription vitamin D weekly Her last level was 43.6  HYPERTENSION:   She is on benazepril HCTZ which she will continue Blood pressure slightly higher from anxiety today  HYPOTHYROIDISM:  As above she thinks she is intolerant to the generic levothyroxine However has not taken any medication for the last 3 days   History of leukocytosis: Has not had any increase in the white blood cells and she will continue following up with hematologist       Plan:     She can go back to brand-name Synthroid 100 mcg daily Follow-up labs in about a month  30 tablets of Ativan 0.5 mg given for anxiety until seen by PCP     [name] 07/08/20

## 2020-07-15 DIAGNOSIS — G894 Chronic pain syndrome: Secondary | ICD-10-CM | POA: Diagnosis not present

## 2020-07-21 ENCOUNTER — Other Ambulatory Visit: Payer: Self-pay | Admitting: Family

## 2020-08-05 DIAGNOSIS — L570 Actinic keratosis: Secondary | ICD-10-CM | POA: Diagnosis not present

## 2020-08-05 DIAGNOSIS — L859 Epidermal thickening, unspecified: Secondary | ICD-10-CM | POA: Diagnosis not present

## 2020-08-05 DIAGNOSIS — D485 Neoplasm of uncertain behavior of skin: Secondary | ICD-10-CM | POA: Diagnosis not present

## 2020-08-05 DIAGNOSIS — Z85828 Personal history of other malignant neoplasm of skin: Secondary | ICD-10-CM | POA: Diagnosis not present

## 2020-08-05 DIAGNOSIS — L723 Sebaceous cyst: Secondary | ICD-10-CM | POA: Diagnosis not present

## 2020-08-05 DIAGNOSIS — L820 Inflamed seborrheic keratosis: Secondary | ICD-10-CM | POA: Diagnosis not present

## 2020-08-05 DIAGNOSIS — L821 Other seborrheic keratosis: Secondary | ICD-10-CM | POA: Diagnosis not present

## 2020-08-12 ENCOUNTER — Other Ambulatory Visit: Payer: Self-pay

## 2020-08-12 ENCOUNTER — Other Ambulatory Visit (INDEPENDENT_AMBULATORY_CARE_PROVIDER_SITE_OTHER): Payer: Medicare Other

## 2020-08-12 DIAGNOSIS — E039 Hypothyroidism, unspecified: Secondary | ICD-10-CM | POA: Diagnosis not present

## 2020-08-12 DIAGNOSIS — G894 Chronic pain syndrome: Secondary | ICD-10-CM | POA: Diagnosis not present

## 2020-08-12 DIAGNOSIS — M810 Age-related osteoporosis without current pathological fracture: Secondary | ICD-10-CM | POA: Diagnosis not present

## 2020-08-12 DIAGNOSIS — I10 Essential (primary) hypertension: Secondary | ICD-10-CM | POA: Diagnosis not present

## 2020-08-12 LAB — COMPREHENSIVE METABOLIC PANEL
ALT: 13 U/L (ref 0–35)
AST: 12 U/L (ref 0–37)
Albumin: 3.6 g/dL (ref 3.5–5.2)
Alkaline Phosphatase: 77 U/L (ref 39–117)
BUN: 17 mg/dL (ref 6–23)
CO2: 30 mEq/L (ref 19–32)
Calcium: 9.1 mg/dL (ref 8.4–10.5)
Chloride: 100 mEq/L (ref 96–112)
Creatinine, Ser: 0.71 mg/dL (ref 0.40–1.20)
GFR: 77.12 mL/min (ref >60.00)
Glucose, Bld: 133 mg/dL — ABNORMAL HIGH (ref 70–99)
Potassium: 3.9 mEq/L (ref 3.5–5.1)
Sodium: 140 mEq/L (ref 135–145)
Total Bilirubin: 1 mg/dL (ref 0.2–1.2)
Total Protein: 6.3 g/dL (ref 6.0–8.3)

## 2020-08-12 LAB — VITAMIN D 25 HYDROXY (VIT D DEFICIENCY, FRACTURES): VITD: 35.71 ng/mL (ref 30.00–100.00)

## 2020-08-12 LAB — T4, FREE: Free T4: 1.13 ng/dL (ref 0.60–1.60)

## 2020-08-12 LAB — TSH: TSH: 4.58 u[IU]/mL — ABNORMAL HIGH (ref 0.35–4.50)

## 2020-08-12 NOTE — Progress Notes (Signed)
Her thyroid level is slightly low.  She can continue Synthroid 100 mcg daily but add extra half pill on Sundays. Vitamin D level is lower than usual.  Need to make sure she is getting her 50,000 units prescription vitamin D weekly

## 2020-08-20 ENCOUNTER — Other Ambulatory Visit: Payer: Self-pay | Admitting: Endocrinology

## 2020-08-20 ENCOUNTER — Other Ambulatory Visit: Payer: Self-pay | Admitting: Family

## 2020-08-30 DIAGNOSIS — G894 Chronic pain syndrome: Secondary | ICD-10-CM | POA: Diagnosis not present

## 2020-09-01 ENCOUNTER — Other Ambulatory Visit: Payer: Self-pay | Admitting: Thoracic Surgery (Cardiothoracic Vascular Surgery)

## 2020-09-01 ENCOUNTER — Other Ambulatory Visit: Payer: Self-pay

## 2020-09-01 DIAGNOSIS — I712 Thoracic aortic aneurysm, without rupture, unspecified: Secondary | ICD-10-CM

## 2020-09-02 ENCOUNTER — Encounter: Payer: Self-pay | Admitting: Internal Medicine

## 2020-09-02 ENCOUNTER — Ambulatory Visit (INDEPENDENT_AMBULATORY_CARE_PROVIDER_SITE_OTHER): Payer: Medicare Other | Admitting: Internal Medicine

## 2020-09-02 ENCOUNTER — Telehealth: Payer: Self-pay | Admitting: Endocrinology

## 2020-09-02 VITALS — BP 124/78 | HR 105 | Temp 98.6°F | Resp 16 | Ht 68.0 in

## 2020-09-02 DIAGNOSIS — E039 Hypothyroidism, unspecified: Secondary | ICD-10-CM

## 2020-09-02 DIAGNOSIS — Z23 Encounter for immunization: Secondary | ICD-10-CM

## 2020-09-02 DIAGNOSIS — D6859 Other primary thrombophilia: Secondary | ICD-10-CM

## 2020-09-02 DIAGNOSIS — I10 Essential (primary) hypertension: Secondary | ICD-10-CM

## 2020-09-02 NOTE — Progress Notes (Signed)
Subjective:  Patient ID: Angela [name], female    DOB: [birth year]  Age: 87 y.o. MRN: [id number]  CC: Hypertension  This visit occurred during the SARS-CoV-2 public health emergency.  Safety protocols were in place, including screening questions prior to the visit, additional usage of staff PPE, and extensive cleaning of exam room while observing appropriate contact time as indicated for disinfecting solutions.    HPI Angela [name] [name] presents for f/up and to establish.  She has a complex medical history and needs a new PCP.  She has rare episodes of chest pain but the pain goes away with tums so she thinks it is related to heartburn.  She denies shortness of breath, palpitations, dizziness, or lightheadedness.  She is considered hypercoagulable and has had a PE.  She is anticoagulated with a DOAC.  Outpatient Medications Prior to Visit  Medication Sig Dispense Refill   albuterol (VENTOLIN HFA) 108 (90 Base) MCG/ACT inhaler Inhale 2 puffs into the lungs 4 (four) times daily as needed for wheezing or shortness of breath. 18 g 1   alendronate (FOSAMAX) 70 MG tablet TAKE 1 TAB ONCE A WEEK, AT LEAST 30 MIN BEFORE 1ST FOOD.DO NOT LIE DOWN FOR 30 MIN AFTER TAKING. 12 tablet 3   benazepril-hydrochlorthiazide (LOTENSIN HCT) 20-12.5 MG tablet TAKE 1 TABLET ONCE DAILY. 90 tablet 3   diclofenac Sodium (VOLTAREN) 1 % GEL Apply 1 application topically 4 (four) times daily.     ELIQUIS 5 MG TABS tablet TAKE 1 TABLET BY MOUTH TWICE DAILY. 180 tablet 3   furosemide (LASIX) 20 MG tablet Take 0.5-1 tablets (10-20 mg total) by mouth daily as needed for fluid or edema (If weight gain by 2lbs in 24 hours). 60 tablet 0   isosorbide mononitrate (IMDUR) 30 MG 24 hr tablet TAKE 1 TABLET ONCE DAILY. 90 tablet 3   Multiple Vitamins-Minerals (ICAPS AREDS 2 PO) Take 1 tablet by mouth daily.      pantoprazole (PROTONIX) 40 MG tablet TAKE 1 TABLET ONCE DAILY. 90 tablet 3   polyethylene glycol powder  (GLYCOLAX/MIRALAX) 17 GM/SCOOP powder DISSOLVE ONE CAPFUL IN LIQUID AND DRINK 3 TIMES A DAY AS NEEDED FOR MODERATE CONSTIPATION. 1020 g 0   potassium chloride (KLOR-CON) 10 MEQ tablet TAKE 1 TABLET ONCE DAILY. 90 tablet 3   PRESCRIPTION MEDICATION Inject 1 application as directed every 30 (thirty) days. Cortisone injections, each knee     SYNTHROID 100 MCG tablet Take 1 tablet (100 mcg total) by mouth daily before breakfast. 30 tablet 5   Vitamin D, Ergocalciferol, (DRISDOL) 1.25 MG (50000 UNIT) CAPS capsule TAKE ONE CAPSULE ONCE A WEEK 4 capsule 0   loperamide (IMODIUM A-D) 2 MG tablet Take 1 tablet (2 mg total) by mouth 3 (three) times daily as needed for diarrhea or loose stools. 15 tablet 0   ondansetron (ZOFRAN) 4 MG tablet TAKE 1 TABLET EVERY 8 HOURS AS NEEDED FOR NAUSEA AND VOMITING. 20 tablet 0   PERCOCET 10-325 MG per tablet Take 0.25-1 tablets by mouth every 6 (six) hours as needed for pain.      VOLTAREN 1 % GEL Apply 2 g topically 4 (four) times daily as needed (pain). To knees     fluorouracil (EFUDEX) 5 % cream Apply topically 2 (two) times daily. (Patient not taking: Reported on 09/02/2020)     LORazepam (ATIVAN) 0.5 MG tablet TAKE (1) TABLET TWICE DAILY AS NEEDED FOR ANXIETY. (Patient not taking: Reported on 09/02/2020) 30 tablet 0  No facility-administered medications prior to visit.    ROS Review of Systems  Constitutional:  Negative for diaphoresis, fatigue and unexpected weight change.  HENT: Negative.    Respiratory:  Negative for chest tightness, shortness of breath and wheezing.   Cardiovascular:  Positive for chest pain. Negative for palpitations and leg swelling.  Gastrointestinal:  Positive for constipation. Negative for abdominal pain, blood in stool and diarrhea.  Endocrine: Negative for cold intolerance and heat intolerance.  Genitourinary: Negative.   Musculoskeletal:  Positive for arthralgias. Negative for myalgias.  Neurological: Negative.  Negative for  weakness.  Hematological:  Negative for adenopathy. Does not bruise/bleed easily.  Psychiatric/Behavioral: Negative.     Objective:  BP 124/78 (BP Location: Right Arm, Patient Position: Sitting, Cuff Size: Large)   Pulse (!) 105   Temp 98.6 F (37 C) (Oral)   Resp 16   Ht 5\' 8"  (1.727 m)   SpO2 96%   BMI 30.41 kg/m   BP Readings from Last 3 Encounters:  09/02/20 124/78  07/08/20 (!) 142/68  06/13/20 120/70    Wt Readings from Last 3 Encounters:  06/13/20 200 lb (90.7 kg)  05/24/18 270 lb 11.6 oz (122.8 kg)  09/06/17 202 lb (91.6 kg)    Physical Exam Vitals reviewed.  HENT:     Nose: Nose normal.     Mouth/Throat:     Pharynx: Oropharynx is clear.  Eyes:     General: No scleral icterus.    Conjunctiva/sclera: Conjunctivae normal.  Cardiovascular:     Rate and Rhythm: Normal rate and regular rhythm.     Heart sounds: Murmur heard.  Systolic murmur is present with a grade of 2/6.  No diastolic murmur is present.    No friction rub. No gallop.  Pulmonary:     Effort: Pulmonary effort is normal.     Breath sounds: No stridor. No wheezing, rhonchi or rales.  Abdominal:     General: Abdomen is flat. There is no distension.     Palpations: There is no mass.     Tenderness: There is no abdominal tenderness.  Musculoskeletal:     Cervical back: Neck supple.     Right lower leg: No edema.     Left lower leg: No edema.  Lymphadenopathy:     Cervical: No cervical adenopathy.  Skin:    General: Skin is warm and dry.     Coloration: Skin is not jaundiced.  Neurological:     General: No focal deficit present.     Mental Status: She is alert and oriented to person, place, and time.  Psychiatric:        Mood and Affect: Mood normal.        Behavior: Behavior normal.    Lab Results  Component Value Date   WBC 13.7 (H) 05/05/2020   HGB 15.0 05/05/2020   HCT 43.9 05/05/2020   PLT 284 05/05/2020   GLUCOSE 133 (H) 08/12/2020   CHOL 217 (H) 07/03/2019   TRIG 102.0  07/03/2019   HDL 44.20 07/03/2019   LDLDIRECT 193.5 02/07/2013   LDLCALC 152 (H) 07/03/2019   ALT 13 08/12/2020   AST 12 08/12/2020   NA 140 08/12/2020   K 3.9 08/12/2020   CL 100 08/12/2020   CREATININE 0.71 08/12/2020   BUN 17 08/12/2020   CO2 30 08/12/2020   TSH 4.58 (H) 08/12/2020   INR 1.4 (H) 05/22/2018   HGBA1C 5.7 07/03/2019   MICROALBUR 0.4 12/05/2012    CT ANGIO CHEST  AORTA W/CM & OR WO/CM  Result Date: 10/04/2019 CLINICAL DATA:  Thoracic aortic aneurysm, EXAM: CT ANGIOGRAPHY CHEST WITH CONTRAST TECHNIQUE: Multidetector CT imaging of the chest was performed using the standard protocol during bolus administration of intravenous contrast. Multiplanar CT image reconstructions and MIPs were obtained to evaluate the vascular anatomy. CONTRAST:  68mL ISOVUE-370 IOPAMIDOL (ISOVUE-370) INJECTION 76% COMPARISON:  09/21/2018 FINDINGS: Cardiovascular: Tortuous aorta. Stable ascending thoracic aortic aneurysm, 4.1 cm. Heart is normal size. No dissection. Mediastinum/Nodes: No mediastinal, hilar, or axillary adenopathy. Trachea and esophagus are unremarkable. Thyroid unremarkable. Lungs/Pleura: Lungs are clear. No focal airspace opacities or suspicious nodules. No effusions. Upper Abdomen: Imaging into the upper abdomen shows no acute findings. Musculoskeletal: Bilateral breast implants. Chest wall soft tissues unremarkable. No acute bony abnormality. Multiple compression fractures in the mid and lower thoracic spine are stable. Review of the MIP images confirms the above findings. IMPRESSION: Stable 4.1 cm ascending thoracic aortic aneurysm. Tortuous aorta. Recommend annual imaging followup by CTA or MRA. This recommendation follows 2010 ACCF/AHA/AATS/ACR/ASA/SCA/SCAI/SIR/STS/SVM Guidelines for the Diagnosis and Management of Patients with Thoracic Aortic Disease. Circulation. 2010; 121: J191-Y782. Aortic aneurysm NOS (ICD10-I71.9) No acute cardiopulmonary disease. Electronically Signed   By: Rolm Baptise M.D.   On: 10/04/2019 10:04    Assessment & Plan:   Emma was seen today for hypertension.  Diagnoses and all orders for this visit:  Need for vaccination -     Tdap vaccine greater than or equal to 7yo IM  Primary hypertension- Her blood pressure is adequately well controlled.  Hypothyroidism (acquired)- Her recent TSH was in the normal range.  She will stay on the current dose of levothyroxine.  Hypercoagulable state, primary [facility])- She will continue being anticoagulated with the DOAC.  I have discontinued [facility] L. [name] Gallant's Percocet, loperamide, and ondansetron. I am also having her maintain her Multiple Vitamins-Minerals (ICAPS AREDS 2 PO), PRESCRIPTION MEDICATION, furosemide, benazepril-hydrochlorthiazide, Eliquis, potassium chloride, pantoprazole, isosorbide mononitrate, alendronate, albuterol, LORazepam, Synthroid, polyethylene glycol powder, Vitamin D (Ergocalciferol), diclofenac Sodium, and fluorouracil.  No orders of the defined types were placed in this encounter.    Follow-up: Return in about 6 months (around 03/04/2021).  Scarlette Calico, MD

## 2020-09-02 NOTE — Telephone Encounter (Signed)
Patient called asking if Dr Dwyane Dee could add a "few more pills" to her monthly prescriptions for Synthroid since she now has to take an additional half pill on sundays. Patient ph# [phone]

## 2020-09-02 NOTE — Patient Instructions (Signed)

## 2020-09-03 ENCOUNTER — Encounter: Payer: Self-pay | Admitting: Internal Medicine

## 2020-09-03 DIAGNOSIS — Z23 Encounter for immunization: Secondary | ICD-10-CM | POA: Insufficient documentation

## 2020-09-03 DIAGNOSIS — D6859 Other primary thrombophilia: Secondary | ICD-10-CM | POA: Insufficient documentation

## 2020-09-04 ENCOUNTER — Other Ambulatory Visit: Payer: Self-pay | Admitting: Endocrinology

## 2020-09-04 DIAGNOSIS — E039 Hypothyroidism, unspecified: Secondary | ICD-10-CM

## 2020-09-04 MED ORDER — SYNTHROID 100 MCG PO TABS: ORAL_TABLET | ORAL | 5 refills | Status: DC

## 2020-09-04 NOTE — Telephone Encounter (Signed)
Rx updated and sent to preferred pharmacy. °

## 2020-09-08 ENCOUNTER — Other Ambulatory Visit: Payer: Self-pay | Admitting: Family

## 2020-09-09 DIAGNOSIS — M5136 Other intervertebral disc degeneration, lumbar region: Secondary | ICD-10-CM | POA: Diagnosis not present

## 2020-09-09 DIAGNOSIS — G894 Chronic pain syndrome: Secondary | ICD-10-CM | POA: Diagnosis not present

## 2020-09-09 DIAGNOSIS — M545 Low back pain, unspecified: Secondary | ICD-10-CM | POA: Diagnosis not present

## 2020-09-09 DIAGNOSIS — M179 Osteoarthritis of knee, unspecified: Secondary | ICD-10-CM | POA: Diagnosis not present

## 2020-09-16 ENCOUNTER — Other Ambulatory Visit: Payer: Self-pay | Admitting: Endocrinology

## 2020-09-16 DIAGNOSIS — M170 Bilateral primary osteoarthritis of knee: Secondary | ICD-10-CM | POA: Diagnosis not present

## 2020-09-18 ENCOUNTER — Other Ambulatory Visit: Payer: Self-pay | Admitting: Endocrinology

## 2020-09-19 ENCOUNTER — Other Ambulatory Visit: Payer: Self-pay | Admitting: Endocrinology

## 2020-09-29 DIAGNOSIS — G894 Chronic pain syndrome: Secondary | ICD-10-CM | POA: Diagnosis not present

## 2020-10-01 ENCOUNTER — Other Ambulatory Visit: Payer: Self-pay | Admitting: Internal Medicine

## 2020-10-02 ENCOUNTER — Ambulatory Visit
Admission: RE | Admit: 2020-10-02 | Discharge: 2020-10-02 | Disposition: A | Discharge status: Home / Self Care | Payer: Medicare Other | Source: Ambulatory Visit | Attending: Thoracic Surgery (Cardiothoracic Vascular Surgery) | Admitting: Thoracic Surgery (Cardiothoracic Vascular Surgery)

## 2020-10-02 DIAGNOSIS — I2510 Atherosclerotic heart disease of native coronary artery without angina pectoris: Secondary | ICD-10-CM | POA: Diagnosis not present

## 2020-10-02 DIAGNOSIS — I712 Thoracic aortic aneurysm, without rupture, unspecified: Secondary | ICD-10-CM

## 2020-10-02 DIAGNOSIS — M4855XA Collapsed vertebra, not elsewhere classified, thoracolumbar region, initial encounter for fracture: Secondary | ICD-10-CM | POA: Diagnosis not present

## 2020-10-02 DIAGNOSIS — M4854XA Collapsed vertebra, not elsewhere classified, thoracic region, initial encounter for fracture: Secondary | ICD-10-CM | POA: Diagnosis not present

## 2020-10-02 MED ORDER — IOPAMIDOL (ISOVUE-370) INJECTION 76%
75.0000 mL | Freq: Once | INTRAVENOUS | Status: AC | PRN
  Administered 2020-10-02: 75 mL via INTRAVENOUS

## 2020-10-07 ENCOUNTER — Ambulatory Visit: Payer: Medicare Other | Admitting: Thoracic Surgery (Cardiothoracic Vascular Surgery)

## 2020-10-07 DIAGNOSIS — G894 Chronic pain syndrome: Secondary | ICD-10-CM | POA: Diagnosis not present

## 2020-10-07 DIAGNOSIS — M5136 Other intervertebral disc degeneration, lumbar region: Secondary | ICD-10-CM | POA: Diagnosis not present

## 2020-10-07 DIAGNOSIS — M1990 Unspecified osteoarthritis, unspecified site: Secondary | ICD-10-CM | POA: Diagnosis not present

## 2020-10-07 DIAGNOSIS — M179 Osteoarthritis of knee, unspecified: Secondary | ICD-10-CM | POA: Diagnosis not present

## 2020-10-19 ENCOUNTER — Other Ambulatory Visit: Payer: Self-pay | Admitting: Internal Medicine

## 2020-10-22 DIAGNOSIS — H5211 Myopia, right eye: Secondary | ICD-10-CM | POA: Diagnosis not present

## 2020-10-22 DIAGNOSIS — H353223 Exudative age-related macular degeneration, left eye, with inactive scar: Secondary | ICD-10-CM | POA: Diagnosis not present

## 2020-10-22 DIAGNOSIS — H0012 Chalazion right lower eyelid: Secondary | ICD-10-CM | POA: Diagnosis not present

## 2020-10-22 DIAGNOSIS — Z961 Presence of intraocular lens: Secondary | ICD-10-CM | POA: Diagnosis not present

## 2020-10-28 ENCOUNTER — Ambulatory Visit (INDEPENDENT_AMBULATORY_CARE_PROVIDER_SITE_OTHER): Payer: Medicare Other | Admitting: Physician Assistant

## 2020-10-28 ENCOUNTER — Other Ambulatory Visit: Payer: Self-pay

## 2020-10-28 VITALS — BP 122/87 | HR 114 | Resp 20 | Ht 68.0 in

## 2020-10-28 DIAGNOSIS — I712 Thoracic aortic aneurysm, without rupture, unspecified: Secondary | ICD-10-CM

## 2020-10-28 NOTE — Progress Notes (Signed)
HPI:  Patient returns for routine follow up for 1 year surveillance for ascending aortic aneurysm.  [name] is a very pleasant 87 yo lady who overall is doing pretty well.  She has a  history of breast cancer, DVT, PE, type 2 diabetes, hypertension, hyperlipidemia, reflux, arthritis, thoracic aortic atherosclerosis, heart murmur, and an ascending aortic aneurysm. She does have pain in her knees which limits her mobility.  She gets steroid injections and uses a walker to get around.  However, she states she doesn't go around as much as she used to.  She also currently has some problems with her left eye, stating she was diagnosed with macular degeneration.  She denies chest pain and shortness of breath currently.  Her blood pressure is well controlled.    Current Outpatient Medications  Medication Sig Dispense Refill   albuterol (VENTOLIN HFA) 108 (90 Base) MCG/ACT inhaler Inhale 2 puffs into the lungs 4 (four) times daily as needed for wheezing or shortness of breath. 18 g 1   alendronate (FOSAMAX) 70 MG tablet TAKE 1 TAB ONCE A WEEK, AT LEAST 30 MIN BEFORE 1ST FOOD.DO NOT LIE DOWN FOR 30 MIN AFTER TAKING. 12 tablet 3   benazepril-hydrochlorthiazide (LOTENSIN HCT) 20-12.5 MG tablet TAKE 1 TABLET ONCE DAILY. 90 tablet 3   diclofenac Sodium (VOLTAREN) 1 % GEL Apply 1 application topically 4 (four) times daily.     ELIQUIS 5 MG TABS tablet TAKE 1 TABLET BY MOUTH TWICE DAILY. 180 tablet 3   fluorouracil (EFUDEX) 5 % cream Apply topically 2 (two) times daily.     furosemide (LASIX) 20 MG tablet Take 0.5-1 tablets (10-20 mg total) by mouth daily as needed for fluid or edema (If weight gain by 2lbs in 24 hours). 60 tablet 0   isosorbide mononitrate (IMDUR) 30 MG 24 hr tablet TAKE 1 TABLET ONCE DAILY. 90 tablet 3   LORazepam (ATIVAN) 0.5 MG tablet TAKE (1) TABLET TWICE DAILY AS NEEDED FOR ANXIETY. 30 tablet 0   Multiple Vitamins-Minerals (ICAPS AREDS 2 PO) Take 1 tablet by mouth daily.      pantoprazole  (PROTONIX) 40 MG tablet TAKE 1 TABLET ONCE DAILY. 90 tablet 3   polyethylene glycol powder (GLYCOLAX/MIRALAX) 17 GM/SCOOP powder DISSOLVE ONE CAPFUL IN LIQUID AND DRINK 3 TIMES A DAY AS NEEDED FOR MODERATE CONSTIPATION. 1020 g 0   potassium chloride (KLOR-CON) 10 MEQ tablet TAKE 1 TABLET ONCE DAILY. 90 tablet 3   PRESCRIPTION MEDICATION Inject 1 application as directed every 30 (thirty) days. Cortisone injections, each knee     SYNTHROID 100 MCG tablet Take 1 tablet (100 mcg total) by mouth daily before breakfast AND 0.5 tablets (50 mcg total) once a week. On Sundays. 32 tablet 5   Vitamin D, Ergocalciferol, (DRISDOL) 1.25 MG (50000 UNIT) CAPS capsule TAKE ONE CAPSULE ONCE A WEEK 4 capsule 0   No current facility-administered medications for this visit.    Physical Exam  BP 122/87 (BP Location: Right Arm, Patient Position: Sitting)   Pulse (!) 114   Resp 20   Ht '5\' 8"'$  (1.727 m)   SpO2 95% Comment: RA  BMI 30.41 kg/m   Gen: no apparent distress Heart: RRR + murmur Lungs: CTA bilaterally  Diagnostic Tests:  CTA Aorta (10/03/20)  1. Mild ectasia of the ascending thoracic aorta (4.1 cm in diameter), stable compared to the prior study. Recommend annual imaging followup by CTA or MRA. This recommendation follows 2010 ACCF/AHA/AATS/ACR/ASA/SCA/SCAI/SIR/STS/SVM Guidelines for the Diagnosis and Management of Patients with  Thoracic Aortic Disease. Circulation. 2010; 121ML:[id number]. Aortic aneurysm NOS (ICD10-I71.9) 2. There is also left anterior descending coronary artery disease. 3. Lipomatous hypertrophy of the interatrial septum (normal anatomical variant) incidentally noted. 4. Probable hepatic steatosis. 5. Additional incidental findings, as above.  A/P:  [name] is a very pleasant 87 yo lady.  Her ascending aortic aneurysm remains stable in size at 4.1.  Her blood pressure remains well controlled.  She was given the option to stop surveillance due to her age and not being a  surgical candidate should the aneurysm enlarge.  She wishes to continue with surveillance. We will see her back in the office in 1 year with repeat CTA chest aorta with/without contrast.  Ellwood Handler, PA-C [facility] [phone]

## 2020-10-29 DIAGNOSIS — G894 Chronic pain syndrome: Secondary | ICD-10-CM | POA: Diagnosis not present

## 2020-11-04 DIAGNOSIS — G894 Chronic pain syndrome: Secondary | ICD-10-CM | POA: Diagnosis not present

## 2020-11-04 DIAGNOSIS — M5136 Other intervertebral disc degeneration, lumbar region: Secondary | ICD-10-CM | POA: Diagnosis not present

## 2020-11-04 DIAGNOSIS — M545 Low back pain, unspecified: Secondary | ICD-10-CM | POA: Diagnosis not present

## 2020-11-04 DIAGNOSIS — M179 Osteoarthritis of knee, unspecified: Secondary | ICD-10-CM | POA: Diagnosis not present

## 2020-11-04 DIAGNOSIS — M1990 Unspecified osteoarthritis, unspecified site: Secondary | ICD-10-CM | POA: Diagnosis not present

## 2020-11-07 DIAGNOSIS — H43811 Vitreous degeneration, right eye: Secondary | ICD-10-CM | POA: Diagnosis not present

## 2020-11-07 DIAGNOSIS — H35373 Puckering of macula, bilateral: Secondary | ICD-10-CM | POA: Diagnosis not present

## 2020-11-07 DIAGNOSIS — H35033 Hypertensive retinopathy, bilateral: Secondary | ICD-10-CM | POA: Diagnosis not present

## 2020-11-07 DIAGNOSIS — H353231 Exudative age-related macular degeneration, bilateral, with active choroidal neovascularization: Secondary | ICD-10-CM | POA: Diagnosis not present

## 2020-11-17 ENCOUNTER — Other Ambulatory Visit: Payer: Self-pay | Admitting: Internal Medicine

## 2020-11-17 DIAGNOSIS — M170 Bilateral primary osteoarthritis of knee: Secondary | ICD-10-CM | POA: Diagnosis not present

## 2020-11-18 DIAGNOSIS — L738 Other specified follicular disorders: Secondary | ICD-10-CM | POA: Diagnosis not present

## 2020-11-18 DIAGNOSIS — L718 Other rosacea: Secondary | ICD-10-CM | POA: Diagnosis not present

## 2020-11-18 DIAGNOSIS — Z85828 Personal history of other malignant neoplasm of skin: Secondary | ICD-10-CM | POA: Diagnosis not present

## 2020-11-18 DIAGNOSIS — L821 Other seborrheic keratosis: Secondary | ICD-10-CM | POA: Diagnosis not present

## 2020-11-18 DIAGNOSIS — D1801 Hemangioma of skin and subcutaneous tissue: Secondary | ICD-10-CM | POA: Diagnosis not present

## 2020-11-18 DIAGNOSIS — L820 Inflamed seborrheic keratosis: Secondary | ICD-10-CM | POA: Diagnosis not present

## 2020-11-18 DIAGNOSIS — L720 Epidermal cyst: Secondary | ICD-10-CM | POA: Diagnosis not present

## 2020-11-28 DIAGNOSIS — G894 Chronic pain syndrome: Secondary | ICD-10-CM | POA: Diagnosis not present

## 2020-12-02 DIAGNOSIS — G894 Chronic pain syndrome: Secondary | ICD-10-CM | POA: Diagnosis not present

## 2020-12-02 DIAGNOSIS — M545 Low back pain, unspecified: Secondary | ICD-10-CM | POA: Diagnosis not present

## 2020-12-02 DIAGNOSIS — M179 Osteoarthritis of knee, unspecified: Secondary | ICD-10-CM | POA: Diagnosis not present

## 2020-12-02 DIAGNOSIS — M1990 Unspecified osteoarthritis, unspecified site: Secondary | ICD-10-CM | POA: Diagnosis not present

## 2020-12-09 DIAGNOSIS — H353231 Exudative age-related macular degeneration, bilateral, with active choroidal neovascularization: Secondary | ICD-10-CM | POA: Diagnosis not present

## 2020-12-09 DIAGNOSIS — H35373 Puckering of macula, bilateral: Secondary | ICD-10-CM | POA: Diagnosis not present

## 2020-12-09 DIAGNOSIS — H35033 Hypertensive retinopathy, bilateral: Secondary | ICD-10-CM | POA: Diagnosis not present

## 2020-12-09 DIAGNOSIS — H43811 Vitreous degeneration, right eye: Secondary | ICD-10-CM | POA: Diagnosis not present

## 2020-12-16 ENCOUNTER — Other Ambulatory Visit: Payer: Self-pay | Admitting: Internal Medicine

## 2020-12-18 ENCOUNTER — Other Ambulatory Visit: Payer: Self-pay | Admitting: Endocrinology

## 2020-12-26 ENCOUNTER — Other Ambulatory Visit: Payer: Self-pay | Admitting: Endocrinology

## 2020-12-28 DIAGNOSIS — G894 Chronic pain syndrome: Secondary | ICD-10-CM | POA: Diagnosis not present

## 2020-12-28 DIAGNOSIS — M259 Joint disorder, unspecified: Secondary | ICD-10-CM | POA: Diagnosis not present

## 2020-12-29 ENCOUNTER — Other Ambulatory Visit: Payer: Self-pay

## 2020-12-29 DIAGNOSIS — I10 Essential (primary) hypertension: Secondary | ICD-10-CM

## 2020-12-29 DIAGNOSIS — R011 Cardiac murmur, unspecified: Secondary | ICD-10-CM

## 2020-12-29 DIAGNOSIS — E039 Hypothyroidism, unspecified: Secondary | ICD-10-CM

## 2020-12-29 DIAGNOSIS — I2699 Other pulmonary embolism without acute cor pulmonale: Secondary | ICD-10-CM

## 2020-12-29 DIAGNOSIS — D6859 Other primary thrombophilia: Secondary | ICD-10-CM

## 2020-12-29 MED ORDER — SYNTHROID 100 MCG PO TABS: ORAL_TABLET | ORAL | 5 refills | Status: DC

## 2020-12-29 MED ORDER — BENAZEPRIL-HYDROCHLOROTHIAZIDE 20-12.5 MG PO TABS
1.0000 | ORAL_TABLET | Freq: Every day | ORAL | 3 refills | Status: DC
Start: 2020-12-29 — End: 2021-04-30

## 2020-12-29 MED ORDER — APIXABAN 5 MG PO TABS: 5.0000 mg | ORAL_TABLET | Freq: Two times a day (BID) | ORAL | 3 refills | Status: DC

## 2020-12-29 MED ORDER — ISOSORBIDE MONONITRATE ER 30 MG PO TB24: 30.0000 mg | ORAL_TABLET | Freq: Every day | ORAL | 3 refills | Status: DC

## 2020-12-30 ENCOUNTER — Other Ambulatory Visit: Payer: Self-pay | Admitting: Internal Medicine

## 2020-12-30 DIAGNOSIS — K219 Gastro-esophageal reflux disease without esophagitis: Secondary | ICD-10-CM

## 2020-12-30 MED ORDER — PANTOPRAZOLE SODIUM 40 MG PO TBEC: 40.0000 mg | DELAYED_RELEASE_TABLET | Freq: Every day | ORAL | 1 refills | Status: DC

## 2021-01-06 DIAGNOSIS — H353231 Exudative age-related macular degeneration, bilateral, with active choroidal neovascularization: Secondary | ICD-10-CM | POA: Diagnosis not present

## 2021-01-13 ENCOUNTER — Encounter: Payer: Self-pay | Admitting: Endocrinology

## 2021-01-13 ENCOUNTER — Other Ambulatory Visit: Payer: Self-pay

## 2021-01-13 ENCOUNTER — Ambulatory Visit (INDEPENDENT_AMBULATORY_CARE_PROVIDER_SITE_OTHER): Payer: Medicare Other | Admitting: Endocrinology

## 2021-01-13 VITALS — BP 125/85 | HR 98

## 2021-01-13 DIAGNOSIS — M810 Age-related osteoporosis without current pathological fracture: Secondary | ICD-10-CM | POA: Diagnosis not present

## 2021-01-13 DIAGNOSIS — E7800 Pure hypercholesterolemia, unspecified: Secondary | ICD-10-CM | POA: Diagnosis not present

## 2021-01-13 DIAGNOSIS — I10 Essential (primary) hypertension: Secondary | ICD-10-CM

## 2021-01-13 DIAGNOSIS — E039 Hypothyroidism, unspecified: Secondary | ICD-10-CM | POA: Diagnosis not present

## 2021-01-13 DIAGNOSIS — E559 Vitamin D deficiency, unspecified: Secondary | ICD-10-CM | POA: Diagnosis not present

## 2021-01-13 LAB — COMPREHENSIVE METABOLIC PANEL
ALT: 11 U/L (ref 0–35)
AST: 15 U/L (ref 0–37)
Albumin: 3.6 g/dL (ref 3.5–5.2)
Alkaline Phosphatase: 88 U/L (ref 39–117)
BUN: 11 mg/dL (ref 6–23)
CO2: 27 mEq/L (ref 19–32)
Calcium: 9.1 mg/dL (ref 8.4–10.5)
Chloride: 98 mEq/L (ref 96–112)
Creatinine, Ser: 0.63 mg/dL (ref 0.40–1.20)
GFR: 80.22 mL/min (ref >60.00)
Glucose, Bld: 136 mg/dL — ABNORMAL HIGH (ref 70–99)
Potassium: 3.6 mEq/L (ref 3.5–5.1)
Sodium: 137 mEq/L (ref 135–145)
Total Bilirubin: 0.8 mg/dL (ref 0.2–1.2)
Total Protein: 6.7 g/dL (ref 6.0–8.3)

## 2021-01-13 LAB — T4, FREE: Free T4: 1.5 ng/dL (ref 0.60–1.60)

## 2021-01-13 LAB — LIPID PANEL
Cholesterol: 232 mg/dL — ABNORMAL HIGH (ref 0–200)
HDL: 43.5 mg/dL (ref >39.00)
LDL Cholesterol: 165 mg/dL — ABNORMAL HIGH (ref 0–99)
NonHDL: 188.32
Total CHOL/HDL Ratio: 5
Triglycerides: 117 mg/dL (ref 0.0–149.0)
VLDL: 23.4 mg/dL (ref 0.0–40.0)

## 2021-01-13 LAB — TSH: TSH: 14.91 u[IU]/mL — ABNORMAL HIGH (ref 0.35–5.50)

## 2021-01-13 LAB — VITAMIN D 25 HYDROXY (VIT D DEFICIENCY, FRACTURES): VITD: 38.72 ng/mL (ref 30.00–100.00)

## 2021-01-13 MED ORDER — LEVOTHYROXINE SODIUM 125 MCG PO TABS: 125.0000 ug | ORAL_TABLET | Freq: Every day | ORAL | 3 refills | Status: DC

## 2021-01-13 NOTE — Addendum Note (Signed)
Addended by: Cinda Quest on: 01/13/2021 04:35 PM   Modules accepted: Orders

## 2021-01-13 NOTE — Patient Instructions (Addendum)
Take Fosamax weekly as directed  Start taking BP weekly  Follow up with PCP

## 2021-01-13 NOTE — Progress Notes (Signed)
Subjective:     Patient ID: Angela [name], female   DOB: [birth year], 87 y.o.   MRN: [id number]  HPI  Reason for visit: Follow-up of hypothyroidism and other problems   Hypertension   Has been on Lotensin HCT 20/12.5 long-term, this has been a consistent dose  Does not monitor blood pressure at home although she reportedly has a meter at home  Blood pressure again is higher in the office checked twice However she tends to have a high pulse in the office  BP Readings from Last 3 Encounters:  01/13/21 125/85  10/28/20 122/87  09/02/20 124/78   Previous history of edema: She is only rarely needing Lasix  PREDIABETES: She has had long-standing impaired fasting glucose  A1c has been consistently normal and below 6% usually  In the past had been treated with metformin  She has not checked glucose at home  No recent labs available  Labs as follows    Lab Results  Component Value Date   HGBA1C 5.7 07/03/2019   HGBA1C 5.7 03/02/2019   HGBA1C 5.9 10/27/2018   Lab Results  Component Value Date   MICROALBUR 0.4 12/05/2012   [place] 165 (H) 01/13/2021   CREATININE 0.63 01/13/2021    OTHER active problems discussed today including hypothyroidism are detailed in review of systems:   Allergies as of 01/13/2021       Reactions   Crestor [rosuvastatin Calcium] Anaphylaxis, Other (See Comments)   Abdominal discomfort   Gadolinium Nausea And Vomiting    pt states she had nausea after receiving MAGNEVIST for breast imaging   Pravastatin Other (See Comments)   Locked jaw, weakness   Simvastatin Other (See Comments)   Weakness and locked jaw.   Penicillins Swelling, Rash   Patient tolerates amoxicillin Has patient had a PCN reaction causing immediate rash, facial/tongue/throat swelling, SOB or lightheadedness with hypotension: yes Has patient had a PCN reaction causing severe rash involving mucus membranes or skin necrosis: unknown Has patient had a PCN  reaction that required hospitalization: unknown Has patient had a PCN reaction occurring within the last 10 years: no If all of the above answers are "NO", then may proceed with Cephalosporin use.        Medication List        Accurate as of January 13, 2021  3:51 PM. If you have any questions, ask your nurse or doctor.          albuterol 108 (90 Base) MCG/ACT inhaler Commonly known as: Ventolin HFA Inhale 2 puffs into the lungs 4 (four) times daily as needed for wheezing or shortness of breath.   alendronate 70 MG tablet Commonly known as: FOSAMAX TAKE 1 TAB ONCE A WEEK, AT LEAST 30 MIN BEFORE 1ST FOOD.DO NOT LIE DOWN FOR 30 MIN AFTER TAKING.   apixaban 5 MG Tabs tablet Commonly known as: Eliquis Take 1 tablet (5 mg total) by mouth 2 (two) times daily.   benazepril-hydrochlorthiazide 20-12.5 MG tablet Commonly known as: LOTENSIN HCT Take 1 tablet by mouth daily.   diclofenac Sodium 1 % Gel Commonly known as: VOLTAREN Apply 1 application topically 4 (four) times daily.   fluorouracil 5 % cream Commonly known as: EFUDEX Apply topically 2 (two) times daily.   furosemide 20 MG tablet Commonly known as: LASIX Take 0.5-1 tablets (10-20 mg total) by mouth daily as needed for fluid or edema (If weight gain by 2lbs in 24 hours).   ICAPS AREDS 2 PO Take 1 tablet  by mouth daily.   isosorbide mononitrate 30 MG 24 hr tablet Commonly known as: IMDUR Take 1 tablet (30 mg total) by mouth daily.   LORazepam 0.5 MG tablet Commonly known as: ATIVAN TAKE (1) TABLET TWICE DAILY AS NEEDED FOR ANXIETY.   pantoprazole 40 MG tablet Commonly known as: PROTONIX Take 1 tablet (40 mg total) by mouth daily.   polyethylene glycol powder 17 GM/SCOOP powder Commonly known as: GLYCOLAX/MIRALAX DISSOLVE ONE CAPFUL IN LIQUID AND DRINK 3 TIMES A DAY AS NEEDED FOR MODERATE CONSTIPATION.   potassium chloride 10 MEQ tablet Commonly known as: KLOR-CON TAKE 1 TABLET ONCE DAILY.    PRESCRIPTION MEDICATION Inject 1 application as directed every 30 (thirty) days. Cortisone injections, each knee   Synthroid 100 MCG tablet Generic drug: levothyroxine Take 1 tablet (100 mcg total) by mouth daily before breakfast AND 0.5 tablets (50 mcg total) once a week. On Sundays.   Vitamin D (Ergocalciferol) 1.25 MG (50000 UNIT) Caps capsule Commonly known as: DRISDOL TAKE ONE CAPSULE ONCE A WEEK        Review of Systems   OSTEOPOROSIS:  She had developed a vertebral fracture late 2018; she  also developed rib fractures in the mid 2018 X-ray report as follows: Vertebral augmentation at T12. A mild T7 compression deformity is new since June. A mild to moderate T9 compression deformity is not significantly changed. Bone density report not available from orthopedic surgeon  Previously had a course of calcitonin that was scribed in 01/2017 with relief of her pain  She had been persistently reluctant to do Prolia injection despite repeated explanations and medication being approved by her insurance  She did start taking Fosamax after her visit in 12/20, this is preferred over Actonel by her insurance She may have had occasional nausea with Fosamax but did not start it after recommending it on the last visit   Primary hypothyroidism: She has had long-standing primary hypothyroidism.   Previously had been taking 125 mcg generic levothyroxine since 06/2017 as a stable dose   However subsequently had gone down to 100 mcg daily Takes brand-name as she does not like generic She was told to take 7-1/2 tablets a week but she is only taking 1 tablet daily  Her only complaints is dry mouth and no symptoms of unusual fatigue or weight gain  No recent labs available  TSH levels:   Lab Results  Component Value Date   TSH 14.91 (H) 01/13/2021   TSH 4.58 (H) 08/12/2020   TSH 0.72 12/18/2019   FREET4 1.50 01/13/2021   FREET4 1.13 08/12/2020   FREET4 1.16 12/18/2019     HYPERLIPIDEMIA: not on a statin drug because of patient's refusal to take medications for fear of having pains, cramps, hair loss or various other possible side effects  Her LDL has been between 130 and 160 recently  Lab Results  Component Value Date   CHOL 232 (H) 01/13/2021   CHOL 217 (H) 07/03/2019   CHOL 206 (H) 03/02/2019   Lab Results  Component Value Date   HDL 43.50 01/13/2021   HDL 44.20 07/03/2019   HDL 33.50 (L) 03/02/2019   Lab Results  Component Value Date   LDLCALC 165 (H) 01/13/2021   LDLCALC 152 (H) 07/03/2019   LDLCALC 140 (H) 03/02/2019   Lab Results  Component Value Date   TRIG 117.0 01/13/2021   TRIG 102.0 07/03/2019   TRIG 164.0 (H) 03/02/2019   Lab Results  Component Value Date   CHOLHDL 5 01/13/2021  CHOLHDL 5 07/03/2019   CHOLHDL 6 03/02/2019   Lab Results  Component Value Date   LDLDIRECT 193.5 02/07/2013   LDLDIRECT 189.5 12/11/2012   LDLDIRECT 201.3 12/05/2012      LEUKOCYTOSIS:  She has periodically high white blood cell count without abnormal morphology, higher after steroid injections  Is seeing the hematologist annually now  Lab Results  Component Value Date   WBC 13.7 (H) 05/05/2020   WBC 14.3 (H) 09/04/2019   WBC 10.5 07/03/2019   WBC 10.2 04/10/2019   She takes small doses of Percocet for knee pain, followed by pain clinic  She gets steroid injections every 3 months in her knee     Objective:   Physical Exam  BP 125/85 (Cuff Size: Normal)   Pulse 98   SpO2 99% Weight refused       Assessment:      OSTEOPOROSIS with history of fracture:  She has been off her Fosamax despite reminders for her to start this  Discussed need to treat her osteoporosis because of her using injectable steroids regularly Also she has history of fracture She does not consistently have any side effects from Fosamax  Encourage her to stay on this for up to 5 years  Also on prescription vitamin D weekly This needs to be  monitored with labs  HYPERTENSION:   She is on benazepril HCTZ blood pressure is relatively higher However may have some whitecoat syndrome as blood pressure came down on second measurement   HYPOTHYROIDISM:  No specific symptoms of hypothyroidism and will need to check labs today         Plan:      Labs to be done today Start checking blood pressure regularly at home and call if consistently high Needing to make follow-up appointment with PCP to discuss various symptoms including dry mouth  Consider increasing her blood pressure medications if blood pressure consistently high  She will take Fosamax as directed weekly and if not tolerating and will try to get prior authorization for Actonel or Boniva But     [name] 01/13/21  Addendum: TSH about 15, go up to 125 mcg Synthroid and follow-up in 2 months Other labs including vitamin D okay, cholesterol may be higher from hypothyroidism  [name]

## 2021-01-15 DIAGNOSIS — M170 Bilateral primary osteoarthritis of knee: Secondary | ICD-10-CM | POA: Diagnosis not present

## 2021-01-16 ENCOUNTER — Other Ambulatory Visit: Payer: Self-pay | Admitting: Internal Medicine

## 2021-02-03 DIAGNOSIS — H35373 Puckering of macula, bilateral: Secondary | ICD-10-CM | POA: Diagnosis not present

## 2021-02-03 DIAGNOSIS — H353231 Exudative age-related macular degeneration, bilateral, with active choroidal neovascularization: Secondary | ICD-10-CM | POA: Diagnosis not present

## 2021-02-03 DIAGNOSIS — H43811 Vitreous degeneration, right eye: Secondary | ICD-10-CM | POA: Diagnosis not present

## 2021-02-03 DIAGNOSIS — H35033 Hypertensive retinopathy, bilateral: Secondary | ICD-10-CM | POA: Diagnosis not present

## 2021-02-03 DIAGNOSIS — H353211 Exudative age-related macular degeneration, right eye, with active choroidal neovascularization: Secondary | ICD-10-CM | POA: Diagnosis not present

## 2021-02-10 DIAGNOSIS — L820 Inflamed seborrheic keratosis: Secondary | ICD-10-CM | POA: Diagnosis not present

## 2021-02-10 DIAGNOSIS — L98499 Non-pressure chronic ulcer of skin of other sites with unspecified severity: Secondary | ICD-10-CM | POA: Diagnosis not present

## 2021-02-10 DIAGNOSIS — Z85828 Personal history of other malignant neoplasm of skin: Secondary | ICD-10-CM | POA: Diagnosis not present

## 2021-02-16 ENCOUNTER — Other Ambulatory Visit: Payer: Self-pay | Admitting: Internal Medicine

## 2021-02-20 DIAGNOSIS — H0011 Chalazion right upper eyelid: Secondary | ICD-10-CM | POA: Diagnosis not present

## 2021-02-24 DIAGNOSIS — G894 Chronic pain syndrome: Secondary | ICD-10-CM | POA: Diagnosis not present

## 2021-03-02 ENCOUNTER — Encounter (HOSPITAL_COMMUNITY): Payer: Self-pay

## 2021-03-02 ENCOUNTER — Emergency Department (HOSPITAL_COMMUNITY): Payer: Medicare Other

## 2021-03-02 ENCOUNTER — Other Ambulatory Visit: Payer: Self-pay

## 2021-03-02 ENCOUNTER — Emergency Department (HOSPITAL_COMMUNITY)
Admission: EM | Admit: 2021-03-02 | Discharge: 2021-03-02 | Disposition: A | Discharge status: Home / Self Care | Payer: Medicare Other | Attending: Emergency Medicine | Admitting: Emergency Medicine

## 2021-03-02 DIAGNOSIS — E119 Type 2 diabetes mellitus without complications: Secondary | ICD-10-CM | POA: Diagnosis not present

## 2021-03-02 DIAGNOSIS — I10 Essential (primary) hypertension: Secondary | ICD-10-CM | POA: Diagnosis not present

## 2021-03-02 DIAGNOSIS — Z853 Personal history of malignant neoplasm of breast: Secondary | ICD-10-CM | POA: Diagnosis not present

## 2021-03-02 DIAGNOSIS — Z79899 Other long term (current) drug therapy: Secondary | ICD-10-CM | POA: Diagnosis not present

## 2021-03-02 DIAGNOSIS — Z7901 Long term (current) use of anticoagulants: Secondary | ICD-10-CM | POA: Insufficient documentation

## 2021-03-02 DIAGNOSIS — R0602 Shortness of breath: Secondary | ICD-10-CM | POA: Insufficient documentation

## 2021-03-02 DIAGNOSIS — E039 Hypothyroidism, unspecified: Secondary | ICD-10-CM | POA: Diagnosis not present

## 2021-03-02 DIAGNOSIS — R0600 Dyspnea, unspecified: Secondary | ICD-10-CM | POA: Diagnosis not present

## 2021-03-02 DIAGNOSIS — R0789 Other chest pain: Secondary | ICD-10-CM | POA: Diagnosis not present

## 2021-03-02 DIAGNOSIS — R079 Chest pain, unspecified: Secondary | ICD-10-CM | POA: Diagnosis not present

## 2021-03-02 DIAGNOSIS — R000 Tachycardia, unspecified: Secondary | ICD-10-CM | POA: Diagnosis not present

## 2021-03-02 LAB — TROPONIN I (HIGH SENSITIVITY)
Troponin I (High Sensitivity): 8 ng/L (ref <18)
Troponin I (High Sensitivity): 9 ng/L (ref <18)

## 2021-03-02 LAB — BASIC METABOLIC PANEL
Anion gap: 10 (ref 5–15)
BUN: 13 mg/dL (ref 8–23)
CO2: 31 mmol/L (ref 22–32)
Calcium: 8.5 mg/dL — ABNORMAL LOW (ref 8.9–10.3)
Chloride: 98 mmol/L (ref 98–111)
Creatinine, Ser: 0.76 mg/dL (ref 0.44–1.00)
GFR, Estimated: >60 mL/min (ref >60)
Glucose, Bld: 173 mg/dL — ABNORMAL HIGH (ref 70–99)
Potassium: 3.6 mmol/L (ref 3.5–5.1)
Sodium: 139 mmol/L (ref 135–145)

## 2021-03-02 LAB — CBC
HCT: 45.8 % (ref 36.0–46.0)
Hemoglobin: 15.5 g/dL — ABNORMAL HIGH (ref 12.0–15.0)
MCH: 31.8 pg (ref 26.0–34.0)
MCHC: 33.8 g/dL (ref 30.0–36.0)
MCV: 93.9 fL (ref 80.0–100.0)
Platelets: 254 10*3/uL (ref 150–400)
RBC: 4.88 MIL/uL (ref 3.87–5.11)
RDW: 13.5 % (ref 11.5–15.5)
WBC: 16.1 10*3/uL — ABNORMAL HIGH (ref 4.0–10.5)
nRBC: 0 % (ref 0.0–0.2)

## 2021-03-02 MED ORDER — MORPHINE SULFATE (PF) 4 MG/ML IV SOLN
4.0000 mg | Freq: Once | INTRAVENOUS | Status: AC
  Administered 2021-03-02: 4 mg via INTRAVENOUS
  Filled 2021-03-02: qty 1

## 2021-03-02 MED ORDER — IOHEXOL 350 MG/ML SOLN
80.0000 mL | Freq: Once | INTRAVENOUS | Status: AC | PRN
  Administered 2021-03-02: 80 mL via INTRAVENOUS

## 2021-03-02 NOTE — ED Triage Notes (Addendum)
Pt BIB EMS from home. Pt reports sharp left sided chest pain and left arm pain x3 days with exertional SHOB. Hx of PE. A&O x4.   BP 150/96 HR 112 96% RA

## 2021-03-02 NOTE — ED Provider Notes (Signed)
[place] DEPT Provider Note   CSN: [id number] Arrival date & time: 03/02/21  1130     History Chief Complaint  Patient presents with   Chest Pain    Angela [name] is a 87 y.o. female.  87 year old female presents with left-sided chest pain x3 days.  Patient is a history of PE and is on Eliquis.  States that the pain is positional and also pleuritic.  No hemoptysis.  Has noted some increased shortness of breath.  Denies any new leg pain or swelling.  No fever or chills.  Noted new treatments used prior to arrival.  [facility] and was transported here.  Denies any anginal symptoms      Past Medical History:  Diagnosis Date   Arthritis    BRCA2 positive 10/214   Breast cancer ([place]) 1994   left sided cancer; unilateral mastectomy, no chemo or radiation   Diabetes mellitus    Dyslipidemia    GERD (gastroesophageal reflux disease)    Heart murmur    Hypertension     Patient Active Problem List   Diagnosis Date Noted   Hypercoagulable state, primary ([place]) 09/03/2020   Need for vaccination 09/03/2020   Ascending aortic aneurysm 01/13/2016   Thoracic aortic atherosclerosis ([place]) 01/13/2016   Pulmonary embolism without acute cor pulmonale (HCC)    Leukocytosis 12/23/2014   BRCA2 positive    Hypothyroidism (acquired) 12/04/2012   Heart murmur 09/01/2012   Hypertension    Dyslipidemia    Prediabetes    Arthritis    GERD (gastroesophageal reflux disease)     Past Surgical History:  Procedure Laterality Date   CARDIAC CATHETERIZATION     NORMAL CORONARY ARTERIES   CHOLECYSTECTOMY     MASTECTOMY     LEFT BREAST   TONSILLECTOMY       OB History   No obstetric history on file.     Family History  Problem Relation Age of Onset   Heart attack Maternal Grandmother    Pancreatic cancer Mother 26   Bladder Cancer Father 59       heavy smoker and drinker   Ovarian cancer Sister 34   Hypertension Sister    Lung cancer Brother  68   Ovarian cancer Maternal Aunt 59   Stomach cancer Maternal Grandfather    Bone cancer Paternal Grandmother        unsure if this was a primary cancer   Stomach cancer Paternal Grandfather    Lung cancer Maternal Aunt        died in her 64s; former smoker   Breast cancer Cousin        dx in her late 89s to 41s; paternal cousin   Heart disease Neg Hx     Social History   Tobacco Use   Smoking status: Never   Smokeless tobacco: Never  Substance Use Topics   Alcohol use: No   Drug use: No    Home Medications Prior to Admission medications   Medication Sig Start Date End Date Taking? Authorizing Provider  albuterol (VENTOLIN HFA) 108 (90 Base) MCG/ACT inhaler Inhale 2 puffs into the lungs 4 (four) times daily as needed for wheezing or shortness of breath. 01/30/20   Marrian Salvage, FNP  alendronate (FOSAMAX) 70 MG tablet TAKE 1 TAB ONCE A WEEK, AT LEAST 30 MIN BEFORE 1ST FOOD.DO NOT LIE DOWN FOR 30 MIN AFTER TAKING. Patient not taking: Reported on 01/13/2021 12/18/20   [name], MD  apixaban (  ELIQUIS) 5 MG TABS tablet Take 1 tablet (5 mg total) by mouth 2 (two) times daily. 12/29/20   [name], MD  benazepril-hydrochlorthiazide (LOTENSIN HCT) 20-12.5 MG tablet Take 1 tablet by mouth daily. 12/29/20   [name], MD  diclofenac Sodium (VOLTAREN) 1 % GEL Apply 1 application topically 4 (four) times daily. 08/29/20   [provider]  fluorouracil (EFUDEX) 5 % cream Apply topically 2 (two) times daily. Patient not taking: Reported on 01/13/2021 04/23/20   [provider]  furosemide (LASIX) 20 MG tablet Take 0.5-1 tablets (10-20 mg total) by mouth daily as needed for fluid or edema (If weight gain by 2lbs in 24 hours). 12/03/19   [name], MD  isosorbide mononitrate (IMDUR) 30 MG 24 hr tablet Take 1 tablet (30 mg total) by mouth daily. 12/29/20   [name], MD  levothyroxine (SYNTHROID) 125 MCG tablet Take 1 tablet (125 mcg total) by mouth  daily. 01/13/21   [name], MD  LORazepam (ATIVAN) 0.5 MG tablet TAKE (1) TABLET TWICE DAILY AS NEEDED FOR ANXIETY. Patient not taking: Reported on 01/13/2021 07/08/20   [name], MD  Multiple Vitamins-Minerals (ICAPS AREDS 2 PO) Take 1 tablet by mouth daily.  Patient not taking: Reported on 01/13/2021    [provider]  pantoprazole (PROTONIX) 40 MG tablet Take 1 tablet (40 mg total) by mouth daily. 12/30/20   [name], MD  polyethylene glycol powder (GLYCOLAX/MIRALAX) 17 GM/SCOOP powder DISSOLVE ONE CAPFUL (17 GM) IN LIQUID AND DRINK THREE TIMES DAILY AS NEEDED FOR MODERATE CONSTIPATION 02/16/21   [name], MD  potassium chloride (KLOR-CON) 10 MEQ tablet TAKE 1 TABLET ONCE DAILY. 12/18/20   [name], MD  PRESCRIPTION MEDICATION Inject 1 application as directed every 30 (thirty) days. Cortisone injections, each knee    [provider]  Vitamin D, Ergocalciferol, (DRISDOL) 1.25 MG (50000 UNIT) CAPS capsule TAKE ONE CAPSULE ONCE A WEEK 02/16/21   [name], MD    Allergies    Crestor [rosuvastatin calcium], Gadolinium, Pravastatin, Simvastatin, and Penicillins  Review of Systems   Review of Systems  All other systems reviewed and are negative.  Physical Exam Updated Vital Signs BP (!) 155/104 (BP Location: Right Arm)   Pulse (!) 118   Temp 98.3 F (36.8 C) (Oral)   Resp 18   SpO2 95%   Physical Exam Vitals and nursing note reviewed.  Constitutional:      General: She is not in acute distress.    Appearance: Normal appearance. She is well-developed. She is not toxic-appearing.  HENT:     Head: Normocephalic and atraumatic.  Eyes:     General: Lids are normal.     Conjunctiva/sclera: Conjunctivae normal.     Pupils: Pupils are equal, round, and reactive to light.  Neck:     Thyroid: No thyroid mass.     Trachea: No tracheal deviation.  Cardiovascular:     Rate and Rhythm: Regular rhythm. Tachycardia present.     Heart sounds:  Normal heart sounds. No murmur heard.   No gallop.  Pulmonary:     Effort: Pulmonary effort is normal. No respiratory distress.     Breath sounds: Normal breath sounds. No stridor. No decreased breath sounds, wheezing, rhonchi or rales.  Chest:    Abdominal:     General: There is no distension.     Palpations: Abdomen is soft.     Tenderness: There is no abdominal tenderness. There is  no rebound.  Musculoskeletal:        General: No tenderness. Normal range of motion.     Cervical back: Normal range of motion and neck supple.  Skin:    General: Skin is warm and dry.     Findings: No abrasion or rash.  Neurological:     Mental Status: She is alert and oriented to person, place, and time. Mental status is at baseline.     GCS: GCS eye subscore is 4. GCS verbal subscore is 5. GCS motor subscore is 6.     Cranial Nerves: No cranial nerve deficit.     Sensory: No sensory deficit.     Motor: Motor function is intact.  Psychiatric:        Attention and Perception: Attention normal.        Speech: Speech normal.        Behavior: Behavior normal.    ED Results / Procedures / Treatments   Labs (all labs ordered are listed, but only abnormal results are displayed) Labs Reviewed  BASIC METABOLIC PANEL  CBC  TROPONIN I (HIGH SENSITIVITY)    EKG EKG Interpretation  Date/Time:  Monday March 02 2021 11:42:47 EST Ventricular Rate:  113 PR Interval:  151 QRS Duration: 90 QT Interval:  333 QTC Calculation: 457 R Axis:   -56 Text Interpretation: Sinus tachycardia Left anterior fascicular block Low voltage, precordial leads Consider anterior infarct Confirmed by Lacretia Leigh ([id number]) on 03/02/2021 11:45:48 AM  Radiology No results found.  Procedures Procedures   Medications Ordered in ED Medications  morphine 4 MG/ML injection 4 mg (has no administration in time range)    ED Course  I have reviewed the triage vital signs and the nursing notes.  Pertinent labs & imaging  results that were available during my care of the patient were reviewed by me and considered in my medical decision making (see chart for details).    MDM Rules/Calculators/A&P                           Chest x-ray and chest CT without acute findings.  Patient is reproducible chest wall pain.  Troponin negative.  Will discharge home Final Clinical Impression(s) / ED Diagnoses Final diagnoses:  None    Rx / DC Orders ED Discharge Orders     None        Lacretia Leigh, MD 03/02/21 1429

## 2021-03-02 NOTE — ED Notes (Signed)
Patient transported to X-ray 

## 2021-03-16 ENCOUNTER — Other Ambulatory Visit: Payer: Self-pay | Admitting: Internal Medicine

## 2021-03-17 ENCOUNTER — Other Ambulatory Visit: Payer: Medicare Other

## 2021-03-19 ENCOUNTER — Other Ambulatory Visit: Payer: Self-pay | Admitting: Family

## 2021-03-20 ENCOUNTER — Other Ambulatory Visit: Payer: Self-pay | Admitting: Internal Medicine

## 2021-03-21 ENCOUNTER — Other Ambulatory Visit: Payer: Self-pay | Admitting: Endocrinology

## 2021-03-22 ENCOUNTER — Emergency Department (HOSPITAL_COMMUNITY): Payer: Medicare Other

## 2021-03-22 ENCOUNTER — Encounter (HOSPITAL_COMMUNITY): Payer: Self-pay | Admitting: Emergency Medicine

## 2021-03-22 ENCOUNTER — Inpatient Hospital Stay (HOSPITAL_COMMUNITY)
Admission: EM | Admit: 2021-03-22 | Discharge: 2021-03-29 | DRG: 871 | Disposition: A | Discharge status: Home Health | Payer: Medicare Other | Attending: Internal Medicine | Admitting: Internal Medicine

## 2021-03-22 ENCOUNTER — Other Ambulatory Visit: Payer: Self-pay

## 2021-03-22 DIAGNOSIS — E876 Hypokalemia: Secondary | ICD-10-CM | POA: Diagnosis present

## 2021-03-22 DIAGNOSIS — Z1501 Genetic susceptibility to malignant neoplasm of breast: Secondary | ICD-10-CM

## 2021-03-22 DIAGNOSIS — Z853 Personal history of malignant neoplasm of breast: Secondary | ICD-10-CM | POA: Diagnosis not present

## 2021-03-22 DIAGNOSIS — E785 Hyperlipidemia, unspecified: Secondary | ICD-10-CM | POA: Diagnosis not present

## 2021-03-22 DIAGNOSIS — R0902 Hypoxemia: Secondary | ICD-10-CM | POA: Diagnosis not present

## 2021-03-22 DIAGNOSIS — I2699 Other pulmonary embolism without acute cor pulmonale: Secondary | ICD-10-CM | POA: Diagnosis not present

## 2021-03-22 DIAGNOSIS — E119 Type 2 diabetes mellitus without complications: Secondary | ICD-10-CM | POA: Diagnosis present

## 2021-03-22 DIAGNOSIS — Z8052 Family history of malignant neoplasm of bladder: Secondary | ICD-10-CM | POA: Diagnosis not present

## 2021-03-22 DIAGNOSIS — Z801 Family history of malignant neoplasm of trachea, bronchus and lung: Secondary | ICD-10-CM

## 2021-03-22 DIAGNOSIS — K5900 Constipation, unspecified: Secondary | ICD-10-CM | POA: Diagnosis present

## 2021-03-22 DIAGNOSIS — R9431 Abnormal electrocardiogram [ECG] [EKG]: Secondary | ICD-10-CM | POA: Diagnosis not present

## 2021-03-22 DIAGNOSIS — A419 Sepsis, unspecified organism: Secondary | ICD-10-CM | POA: Diagnosis not present

## 2021-03-22 DIAGNOSIS — Z8249 Family history of ischemic heart disease and other diseases of the circulatory system: Secondary | ICD-10-CM | POA: Diagnosis not present

## 2021-03-22 DIAGNOSIS — G894 Chronic pain syndrome: Secondary | ICD-10-CM | POA: Diagnosis not present

## 2021-03-22 DIAGNOSIS — R609 Edema, unspecified: Secondary | ICD-10-CM | POA: Diagnosis not present

## 2021-03-22 DIAGNOSIS — Z9012 Acquired absence of left breast and nipple: Secondary | ICD-10-CM

## 2021-03-22 DIAGNOSIS — Z7401 Bed confinement status: Secondary | ICD-10-CM | POA: Diagnosis not present

## 2021-03-22 DIAGNOSIS — Z800 Family history of malignant neoplasm of digestive organs: Secondary | ICD-10-CM

## 2021-03-22 DIAGNOSIS — J189 Pneumonia, unspecified organism: Secondary | ICD-10-CM | POA: Diagnosis present

## 2021-03-22 DIAGNOSIS — Z20822 Contact with and (suspected) exposure to covid-19: Secondary | ICD-10-CM | POA: Diagnosis present

## 2021-03-22 DIAGNOSIS — R17 Unspecified jaundice: Secondary | ICD-10-CM | POA: Diagnosis present

## 2021-03-22 DIAGNOSIS — I444 Left anterior fascicular block: Secondary | ICD-10-CM | POA: Diagnosis present

## 2021-03-22 DIAGNOSIS — Z803 Family history of malignant neoplasm of breast: Secondary | ICD-10-CM

## 2021-03-22 DIAGNOSIS — Z79899 Other long term (current) drug therapy: Secondary | ICD-10-CM | POA: Diagnosis not present

## 2021-03-22 DIAGNOSIS — K219 Gastro-esophageal reflux disease without esophagitis: Secondary | ICD-10-CM | POA: Diagnosis not present

## 2021-03-22 DIAGNOSIS — Z8041 Family history of malignant neoplasm of ovary: Secondary | ICD-10-CM

## 2021-03-22 DIAGNOSIS — I959 Hypotension, unspecified: Secondary | ICD-10-CM | POA: Diagnosis not present

## 2021-03-22 DIAGNOSIS — Z86711 Personal history of pulmonary embolism: Secondary | ICD-10-CM | POA: Diagnosis not present

## 2021-03-22 DIAGNOSIS — I10 Essential (primary) hypertension: Secondary | ICD-10-CM | POA: Diagnosis present

## 2021-03-22 DIAGNOSIS — Z7901 Long term (current) use of anticoagulants: Secondary | ICD-10-CM

## 2021-03-22 DIAGNOSIS — E039 Hypothyroidism, unspecified: Secondary | ICD-10-CM | POA: Diagnosis present

## 2021-03-22 DIAGNOSIS — I517 Cardiomegaly: Secondary | ICD-10-CM | POA: Diagnosis present

## 2021-03-22 DIAGNOSIS — Z7983 Long term (current) use of bisphosphonates: Secondary | ICD-10-CM

## 2021-03-22 DIAGNOSIS — J90 Pleural effusion, not elsewhere classified: Secondary | ICD-10-CM | POA: Diagnosis not present

## 2021-03-22 DIAGNOSIS — R011 Cardiac murmur, unspecified: Secondary | ICD-10-CM | POA: Diagnosis not present

## 2021-03-22 DIAGNOSIS — J45909 Unspecified asthma, uncomplicated: Secondary | ICD-10-CM | POA: Diagnosis present

## 2021-03-22 DIAGNOSIS — Z79890 Hormone replacement therapy (postmenopausal): Secondary | ICD-10-CM

## 2021-03-22 DIAGNOSIS — Z743 Need for continuous supervision: Secondary | ICD-10-CM | POA: Diagnosis not present

## 2021-03-22 DIAGNOSIS — R0989 Other specified symptoms and signs involving the circulatory and respiratory systems: Secondary | ICD-10-CM | POA: Diagnosis not present

## 2021-03-22 DIAGNOSIS — R7303 Prediabetes: Secondary | ICD-10-CM | POA: Diagnosis present

## 2021-03-22 DIAGNOSIS — R0689 Other abnormalities of breathing: Secondary | ICD-10-CM | POA: Diagnosis not present

## 2021-03-22 DIAGNOSIS — R000 Tachycardia, unspecified: Secondary | ICD-10-CM | POA: Diagnosis not present

## 2021-03-22 LAB — COMPREHENSIVE METABOLIC PANEL
ALT: 20 U/L (ref 0–44)
AST: 26 U/L (ref 15–41)
Albumin: 3.7 g/dL (ref 3.5–5.0)
Alkaline Phosphatase: 115 U/L (ref 38–126)
Anion gap: 8 (ref 5–15)
BUN: 8 mg/dL (ref 8–23)
CO2: 30 mmol/L (ref 22–32)
Calcium: 8.2 mg/dL — ABNORMAL LOW (ref 8.9–10.3)
Chloride: 94 mmol/L — ABNORMAL LOW (ref 98–111)
Creatinine, Ser: 0.73 mg/dL (ref 0.44–1.00)
GFR, Estimated: >60 mL/min (ref >60)
Glucose, Bld: 163 mg/dL — ABNORMAL HIGH (ref 70–99)
Potassium: 2.8 mmol/L — ABNORMAL LOW (ref 3.5–5.1)
Sodium: 132 mmol/L — ABNORMAL LOW (ref 135–145)
Total Bilirubin: 2.3 mg/dL — ABNORMAL HIGH (ref 0.3–1.2)
Total Protein: 7.3 g/dL (ref 6.5–8.1)

## 2021-03-22 LAB — CBC WITH DIFFERENTIAL/PLATELET
Abs Immature Granulocytes: 0.28 10*3/uL — ABNORMAL HIGH (ref 0.00–0.07)
Basophils Absolute: 0.1 10*3/uL (ref 0.0–0.1)
Basophils Relative: 0 %
Eosinophils Absolute: 0 10*3/uL (ref 0.0–0.5)
Eosinophils Relative: 0 %
HCT: 48 % — ABNORMAL HIGH (ref 36.0–46.0)
Hemoglobin: 16.5 g/dL — ABNORMAL HIGH (ref 12.0–15.0)
Immature Granulocytes: 1 %
Lymphocytes Relative: 8 %
Lymphs Abs: 2.1 10*3/uL (ref 0.7–4.0)
MCH: 31.7 pg (ref 26.0–34.0)
MCHC: 34.4 g/dL (ref 30.0–36.0)
MCV: 92.1 fL (ref 80.0–100.0)
Monocytes Absolute: 2.2 10*3/uL — ABNORMAL HIGH (ref 0.1–1.0)
Monocytes Relative: 8 %
Neutro Abs: 22.3 10*3/uL — ABNORMAL HIGH (ref 1.7–7.7)
Neutrophils Relative %: 83 %
Platelets: 307 10*3/uL (ref 150–400)
RBC: 5.21 MIL/uL — ABNORMAL HIGH (ref 3.87–5.11)
RDW: 13.2 % (ref 11.5–15.5)
WBC: 26.9 10*3/uL — ABNORMAL HIGH (ref 4.0–10.5)
nRBC: 0 % (ref 0.0–0.2)

## 2021-03-22 LAB — PHOSPHORUS: Phosphorus: 2.9 mg/dL (ref 2.5–4.6)

## 2021-03-22 LAB — PROTIME-INR
INR: 1.4 — ABNORMAL HIGH (ref 0.8–1.2)
Prothrombin Time: 16.8 seconds — ABNORMAL HIGH (ref 11.4–15.2)

## 2021-03-22 LAB — RESP PANEL BY RT-PCR (FLU A&B, COVID) ARPGX2
Influenza A by PCR: NEGATIVE
Influenza B by PCR: NEGATIVE
SARS Coronavirus 2 by RT PCR: NEGATIVE

## 2021-03-22 LAB — LACTIC ACID, PLASMA
Lactic Acid, Venous: 2 mmol/L (ref 0.5–1.9)
Lactic Acid, Venous: 2 mmol/L (ref 0.5–1.9)

## 2021-03-22 LAB — GLUCOSE, CAPILLARY
Glucose-Capillary: 113 mg/dL — ABNORMAL HIGH (ref 70–99)
Glucose-Capillary: 137 mg/dL — ABNORMAL HIGH (ref 70–99)

## 2021-03-22 LAB — APTT: aPTT: 31 seconds (ref 24–36)

## 2021-03-22 LAB — MAGNESIUM: Magnesium: 1.1 mg/dL — ABNORMAL LOW (ref 1.7–2.4)

## 2021-03-22 LAB — BRAIN NATRIURETIC PEPTIDE: B Natriuretic Peptide: 103.5 pg/mL — ABNORMAL HIGH (ref 0.0–100.0)

## 2021-03-22 MED ORDER — BENAZEPRIL HCL 20 MG PO TABS: 20.0000 mg | ORAL_TABLET | Freq: Every day | ORAL | Status: DC

## 2021-03-22 MED ORDER — LEVOFLOXACIN IN D5W 750 MG/150ML IV SOLN: 750.0000 mg | INTRAVENOUS | Status: DC

## 2021-03-22 MED ORDER — OXYCODONE-ACETAMINOPHEN 10-325 MG PO TABS: 0.5000 | ORAL_TABLET | ORAL | Status: DC | PRN

## 2021-03-22 MED ORDER — LEVOTHYROXINE SODIUM 25 MCG PO TABS
125.0000 ug | ORAL_TABLET | Freq: Every day | ORAL | Status: DC
  Administered 2021-03-23 – 2021-03-29 (×7): 125 ug via ORAL
  Filled 2021-03-22 (×7): qty 1

## 2021-03-22 MED ORDER — ACETAMINOPHEN 325 MG PO TABS: 650.0000 mg | ORAL_TABLET | Freq: Four times a day (QID) | ORAL | Status: DC | PRN

## 2021-03-22 MED ORDER — HYDROCHLOROTHIAZIDE 12.5 MG PO TABS: 12.5000 mg | ORAL_TABLET | Freq: Every day | ORAL | Status: DC

## 2021-03-22 MED ORDER — ACETAMINOPHEN 650 MG RE SUPP
650.0000 mg | Freq: Once | RECTAL | Status: AC
  Administered 2021-03-22: 650 mg via RECTAL
  Filled 2021-03-22: qty 1

## 2021-03-22 MED ORDER — ALBUTEROL SULFATE (2.5 MG/3ML) 0.083% IN NEBU
2.5000 mg | INHALATION_SOLUTION | RESPIRATORY_TRACT | Status: DC | PRN
  Administered 2021-03-23 – 2021-03-26 (×3): 2.5 mg via RESPIRATORY_TRACT
  Filled 2021-03-22 (×3): qty 3

## 2021-03-22 MED ORDER — INSULIN ASPART 100 UNIT/ML IJ SOLN
0.0000 [IU] | Freq: Three times a day (TID) | INTRAMUSCULAR | Status: DC
  Administered 2021-03-26 – 2021-03-27 (×2): 2 [IU] via SUBCUTANEOUS
  Filled 2021-03-22: qty 0.15

## 2021-03-22 MED ORDER — ISOSORBIDE MONONITRATE ER 30 MG PO TB24: 30.0000 mg | ORAL_TABLET | Freq: Every day | ORAL | Status: DC

## 2021-03-22 MED ORDER — LEVOFLOXACIN IN D5W 750 MG/150ML IV SOLN
750.0000 mg | Freq: Once | INTRAVENOUS | Status: AC
  Administered 2021-03-22: 750 mg via INTRAVENOUS
  Filled 2021-03-22: qty 150

## 2021-03-22 MED ORDER — BENAZEPRIL-HYDROCHLOROTHIAZIDE 20-12.5 MG PO TABS: 1.0000 | ORAL_TABLET | Freq: Every day | ORAL | Status: DC

## 2021-03-22 MED ORDER — POTASSIUM CHLORIDE CRYS ER 20 MEQ PO TBCR
40.0000 meq | EXTENDED_RELEASE_TABLET | Freq: Once | ORAL | Status: AC
  Administered 2021-03-22: 40 meq via ORAL
  Filled 2021-03-22: qty 2

## 2021-03-22 MED ORDER — LACTATED RINGERS IV BOLUS (SEPSIS)
1000.0000 mL | Freq: Once | INTRAVENOUS | Status: AC
  Administered 2021-03-22: 1000 mL via INTRAVENOUS

## 2021-03-22 MED ORDER — MAGNESIUM SULFATE 4 GM/100ML IV SOLN
4.0000 g | Freq: Once | INTRAVENOUS | Status: AC
  Administered 2021-03-22: 4 g via INTRAVENOUS
  Filled 2021-03-22: qty 100

## 2021-03-22 MED ORDER — APIXABAN 5 MG PO TABS
5.0000 mg | ORAL_TABLET | Freq: Two times a day (BID) | ORAL | Status: DC
  Administered 2021-03-22 – 2021-03-29 (×14): 5 mg via ORAL
  Filled 2021-03-22 (×14): qty 1

## 2021-03-22 MED ORDER — LACTATED RINGERS IV SOLN: INTRAVENOUS | Status: DC

## 2021-03-22 MED ORDER — SODIUM CHLORIDE 0.9 % IV SOLN
2.0000 g | INTRAVENOUS | Status: DC
  Administered 2021-03-23 – 2021-03-24 (×2): 2 g via INTRAVENOUS
  Filled 2021-03-22 (×2): qty 20

## 2021-03-22 MED ORDER — SODIUM CHLORIDE 0.9 % IV SOLN
500.0000 mg | INTRAVENOUS | Status: DC
  Administered 2021-03-23: 500 mg via INTRAVENOUS
  Filled 2021-03-22 (×2): qty 5

## 2021-03-22 MED ORDER — ONDANSETRON HCL 4 MG/2ML IJ SOLN: 4.0000 mg | Freq: Four times a day (QID) | INTRAMUSCULAR | Status: DC | PRN

## 2021-03-22 MED ORDER — POTASSIUM CHLORIDE IN NACL 40-0.9 MEQ/L-% IV SOLN
INTRAVENOUS | Status: DC
  Filled 2021-03-22 (×2): qty 1000

## 2021-03-22 MED ORDER — ONDANSETRON HCL 4 MG PO TABS: 4.0000 mg | ORAL_TABLET | Freq: Four times a day (QID) | ORAL | Status: DC | PRN

## 2021-03-22 MED ORDER — ACETAMINOPHEN 650 MG RE SUPP: 650.0000 mg | Freq: Four times a day (QID) | RECTAL | Status: DC | PRN

## 2021-03-22 MED ORDER — OXYCODONE-ACETAMINOPHEN 5-325 MG PO TABS
1.0000 | ORAL_TABLET | ORAL | Status: DC | PRN
  Administered 2021-03-22 – 2021-03-29 (×27): 1 via ORAL
  Filled 2021-03-22 (×28): qty 1

## 2021-03-22 MED ORDER — GUAIFENESIN 100 MG/5ML PO LIQD
5.0000 mL | ORAL | Status: DC | PRN
  Administered 2021-03-22 – 2021-03-28 (×6): 5 mL via ORAL
  Filled 2021-03-22 (×8): qty 10

## 2021-03-22 MED ORDER — PANTOPRAZOLE SODIUM 40 MG PO TBEC
40.0000 mg | DELAYED_RELEASE_TABLET | Freq: Every day | ORAL | Status: DC
  Administered 2021-03-22 – 2021-03-29 (×8): 40 mg via ORAL
  Filled 2021-03-22 (×8): qty 1

## 2021-03-22 NOTE — Sepsis Progress Note (Signed)
Sepsis protocol is being followed by [facility]. 

## 2021-03-22 NOTE — H&P (Signed)
History and Physical    Angela [name] EUM:[id number] DOB: Jun 18, 1932 DOA: 03/22/2021  PCP: [name], MD   Patient coming from: Home.   I have personally briefly reviewed patient's old medical records in [place]  Chief Complaint: Shortness of breath, cough and congestion for 2 days.  HPI: Angela [name] is a 88 y.o. female with medical history significant of osteoarthritis, breast cancer, aortic atherosclerosis, type II DM, GERD, hypertension, history of PE who is coming to the emergency department due to dyspnea associated with fever, night sweats, cough, congestion, fatigue, malaise, decreased appetite for the past 2 to 3 days.  She had 2 episodes of emesis earlier today.  No travel history or sick contacts.  He has been coughing beige color sputum.  No hemoptysis.  He has pleuritic chest pain, but denied palpitations, dizziness, diaphoresis, PND, orthopnea but stated he occasionally gets lower extremity edema.  No diarrhea, constipation, melena or hematochezia.  Denied dysuria, flank pain, frequency or hematuria.  No polyuria, polydipsia, polyphagia or blurred vision.  ED Course: Initial vital signs were temperature 102.2 F, pulse 7019, respirations 26, BP 101/60 mmHg O2 sat 92 percent on room air.  The patient received 3000 mL of LR bolus, acetaminophen 650 mg PR and Levaquin 750 mg IVPB x1.  I added magnesium sulfate and KCl 40 mEq p.o. x1.  Lab work: CBC is her white count 6.9 with 82% neutrophils, hemoglobin 16.5 g/dL platelets 307.  PT was 16.8, PTT 31 and INR on 4 lactic acid was 2.0 mmol/twice.  CMP showed sodium 132, potassium 2.8 chloride 94 mmol/L.  Renal function was normal.  Glucose was 163, calcium 8.2 and total bilirubin 2.3 mg/dL.  The rest of the LFTs were normal.    Imaging: A portable 1 view chest radiograph atelectasis and/or consolidation of the left lower lobe with a small pleural effusion.  There was mild cardiomegaly.  Aortic atherosclerosis.   Please see images and full radiology report for further details.  Review of Systems: As per HPI otherwise all other systems reviewed and are negative.  Past Medical History:  Diagnosis Date   Arthritis    BRCA2 positive 10/214   Breast cancer ([place]) 1994   left sided cancer; unilateral mastectomy, no chemo or radiation   Diabetes mellitus    Dyslipidemia    GERD (gastroesophageal reflux disease)    Heart murmur    Hypertension    Past Surgical History:  Procedure Laterality Date   CARDIAC CATHETERIZATION     NORMAL CORONARY ARTERIES   CHOLECYSTECTOMY     MASTECTOMY     LEFT BREAST   TONSILLECTOMY     Social History  reports that she has never smoked. She has never used smokeless tobacco. She reports that she does not drink alcohol and does not use drugs.  Allergies  Allergen Reactions   Crestor [Rosuvastatin Calcium] Anaphylaxis and Other (See Comments)    Abdominal discomfort   Gadolinium Nausea And Vomiting     pt states she had nausea after receiving MAGNEVIST for breast imaging    Pravastatin Other (See Comments)    Locked jaw, weakness   Simvastatin Other (See Comments)    Weakness and locked jaw.   Penicillins Swelling and Rash    Patient tolerates amoxicillin  Has patient had a PCN reaction causing immediate rash, facial/tongue/throat swelling, SOB or lightheadedness with hypotension: yes Has patient had a PCN reaction causing severe rash involving mucus membranes or skin necrosis: unknown  Has patient had a PCN reaction that required hospitalization: unknown Has patient had a PCN reaction occurring within the last 10 years: no If all of the above answers are "NO", then may proceed with Cephalosporin use.    Family History  Problem Relation Age of Onset   Heart attack Maternal Grandmother    Pancreatic cancer Mother 28   Bladder Cancer Father 13       heavy smoker and drinker   Ovarian cancer Sister 50   Hypertension Sister    Lung cancer Brother 73    Ovarian cancer Maternal Aunt 58   Stomach cancer Maternal Grandfather    Bone cancer Paternal Grandmother        unsure if this was a primary cancer   Stomach cancer Paternal Grandfather    Lung cancer Maternal Aunt        died in her 71s; former smoker   Breast cancer Cousin        dx in her late 77s to 26s; paternal cousin   Heart disease Neg Hx    Prior to Admission medications   Medication Sig Start Date End Date Taking? Authorizing Provider  apixaban (ELIQUIS) 5 MG TABS tablet Take 1 tablet (5 mg total) by mouth 2 (two) times daily. 12/29/20  Yes [name], MD  benazepril-hydrochlorthiazide (LOTENSIN HCT) 20-12.5 MG tablet Take 1 tablet by mouth daily. 12/29/20  Yes [name], MD  guaiFENesin (ROBITUSSIN) 100 MG/5ML liquid Take 5 mLs by mouth every 4 (four) hours as needed for cough or to loosen phlegm.   Yes [provider]  isosorbide mononitrate (IMDUR) 30 MG 24 hr tablet Take 1 tablet (30 mg total) by mouth daily. 12/29/20  Yes [name], MD  levothyroxine (SYNTHROID) 125 MCG tablet Take 1 tablet (125 mcg total) by mouth daily. 01/13/21  Yes [name], MD  lidocaine (LIDODERM) 5 % Place 1 patch onto the skin daily as needed for pain. 02/26/21  Yes [provider]  pantoprazole (PROTONIX) 40 MG tablet Take 1 tablet (40 mg total) by mouth daily. 12/30/20  Yes [name], MD  PERCOCET 10-325 MG tablet Take 0.5-1 tablets by mouth every 2 (two) hours as needed for pain. 02/26/21  Yes [provider]  potassium chloride (KLOR-CON) 10 MEQ tablet TAKE 1 TABLET ONCE DAILY. Patient taking differently: Take 10 mEq by mouth daily. 12/18/20  Yes [name], MD  sodium chloride (OCEAN) 0.65 % SOLN nasal spray Place 1 spray into both nostrils as needed for congestion.   Yes [provider]  VENTOLIN HFA 108 (90 Base) MCG/ACT inhaler INHALE 2 PUFFS INTO THE LUNGS 4 TIMES A DAY FOR WHEEZING OR SHORTNESS OF BREATH. Patient taking differently:  Inhale 2 puffs into the lungs 4 (four) times daily as needed for wheezing or shortness of breath. 03/20/21  Yes [name], MD  Vitamin D, Ergocalciferol, (DRISDOL) 1.25 MG (50000 UNIT) CAPS capsule TAKE ONE CAPSULE ONCE A WEEK Patient taking differently: Take 50,000 Units by mouth every 7 (seven) days. 03/16/21  Yes [name], MD  alendronate (FOSAMAX) 70 MG tablet TAKE 1 TAB ONCE A WEEK, AT LEAST 30 MIN BEFORE 1ST FOOD.DO NOT LIE DOWN FOR 30 MIN AFTER TAKING. Patient not taking: Reported on 01/13/2021 12/18/20   [name], MD  polyethylene glycol powder (GLYCOLAX/MIRALAX) 17 GM/SCOOP powder DISSOLVE ONE CAPFUL (17 GM) IN LIQUID AND DRINK THREE TIMES DAILY AS NEEDED FOR MODERATE CONSTIPATION Patient not taking: Reported on 03/22/2021  03/16/21   [name], MD   Physical Exam: Vitals:   03/22/21 1230 03/22/21 1245 03/22/21 1300 03/22/21 1330  BP: 99/62 109/72 120/60 111/78  Pulse: (!) 113 (!) 109 (!) 110 (!) 104  Resp: (!) 28 (!) 26 (!) 28 (!) 25  Temp:      TempSrc:      SpO2: 99% 100% 100% 100%  Weight:      Height:       Constitutional: Looks acutely ill.  NAD, calm, comfortable Eyes: PERRL, lids and conjunctivae normal.  Bilateral scleral injection. ENMT: Mucous membranes are moist. Posterior pharynx clear of any exudate or lesions. Neck: normal, supple, no masses, no thyromegaly Respiratory: Decreased breath sounds on LLL field with mild bilateral rhonchi. No accessory muscle use.  Cardiovascular: Regular rate and rhythm, no murmurs / rubs / gallops. No extremity edema. 2+ pedal pulses. No carotid bruits.  Abdomen: Obese, no distention.  Soft, no tenderness, no masses palpated. No hepatosplenomegaly. Bowel sounds positive.  Musculoskeletal: no clubbing / cyanosis.  Mild to moderate generalized weakness.. Good ROM, no contractures. Normal muscle tone.  Skin: no acute rashes, lesions, ulcers on very limited dermatological examination. Neurologic: CN 2-12 grossly intact.  Sensation intact, DTR normal. Strength 5/5 in all 4.  Psychiatric: Normal judgment and insight. Alert and oriented x 3. Normal mood.   Labs on Admission: I have personally reviewed following labs and imaging studies  CBC: Recent Labs  Lab 03/22/21 1210  WBC 26.9*  NEUTROABS 22.3*  HGB 16.5*  HCT 48.0*  MCV 92.1  PLT 782    Basic Metabolic Panel: Recent Labs  Lab 03/22/21 1210  NA 132*  K 2.8*  CL 94*  CO2 30  GLUCOSE 163*  BUN 8  CREATININE 0.73  CALCIUM 8.2*    GFR: Estimated Creatinine Clearance: 59.4 mL/min (by C-G formula based on SCr of 0.73 mg/dL).  Liver Function Tests: Recent Labs  Lab 03/22/21 1210  AST 26  ALT 20  ALKPHOS 115  BILITOT 2.3*  PROT 7.3  ALBUMIN 3.7   Radiological Exams on Admission: DG Chest Port 1 View  Result Date: 03/22/2021 CLINICAL DATA:  88 year old female with history of shortness of breath and cough. EXAM: PORTABLE CHEST 1 VIEW COMPARISON:  Chest x-ray 03/02/2021. FINDINGS: Opacity in the periphery of the left mid to lower lung concerning for airspace consolidation. Small left pleural effusion. Right lung is clear. No pneumothorax. No evidence of pulmonary edema. Heart size is mildly enlarged. The patient is rotated to the left on today's exam, resulting in distortion of the mediastinal contours and reduced diagnostic sensitivity and specificity for mediastinal pathology. Atherosclerotic calcifications in the thoracic aorta. IMPRESSION: 1. Atelectasis and/or consolidation in the left lower lobe with small left pleural effusion. 2. Mild cardiomegaly. 3. Aortic atherosclerosis. Electronically Signed   By: Vinnie Langton M.D.   On: 03/22/2021 13:24    EKG: Independently reviewed.  Vent. rate 114 BPM PR interval 155 ms QRS duration 95 ms QT/QTcB 332/458 ms P-R-T axes 75 -73 58 Sinus tachycardia Multiple premature complexes, vent & supraven Left anterior fascicular block Abnormal R-wave progression,  late  Assessment/Plan Principal Problem:   Sepsis due to pneumonia ([place]) Admit to PCU/inpatient. Continue supplemental oxygen. Continue bronchodilators as needed. Continue ceftriaxone 1 g IV daily. Continue azithromycin 500 mg IV daily. Check strep pneumoniae urinary antigen. Check sputum gram stain, culture and sensitivity. Follow-up blood culture and sensitivity. Follow-up CBC and CMP in AM.  Active Problems:   Hypokalemia Replacing  Follow-up potassium level.    Hypomagnesemia Supplementing. Follow-up level as needed.    Hypocalcemia Recheck calcium level after magnesium correction.    Hypertension Continue benazepril 20 mg p.o. daily. Continue HCTZ 12.5 mg p.o. daily. Monitor renal function electrolytes    Dyslipidemia Currently not on medical therapy.    Prediabetes Carbohydrate modified diet. Check hemoglobin A1c. CBG monitoring before meals and bedtime. RI SS as needed 3 times daily before meals.    GERD (gastroesophageal reflux disease) Continue pantoprazole 40 mg p.o. daily.    Hypothyroidism (acquired) Continue levothyroxine 75 mcg p.o. daily.    Pulmonary embolism without acute cor pulmonale (HCC) Continue apixaban 5 mg p.o. twice daily.    Cardiomegaly Check echocardiogram.    Hyperbilirubinemia Recheck bilirubin in AM.     DVT prophylaxis: On apixaban. Code Status:   Full code. Family Communication:   Disposition Plan:   Patient is from:  Home.  Anticipated DC to:  Home.  Anticipated DC date:  03/24/2021.  Anticipated DC barriers: Clinical condition. Consults called:   Admission status:  Inpatient/PCU.   Severity of Illness: High severity in the setting of sepsis due to pneumonia with new oxygen requirement.  Patient will remain in the hospital for IV antibiotic therapy.  Reubin Milan MD [facility]  How to contact the [facility] Attending or Consulting provider [place] or covering provider during after hours [place], for  this patient?   Check the care team in [facility] and look for a) attending/consulting [facility] provider listed and b) the [facility] team listed Log into [email or web address] and use [place]'s universal password to access. If you do not have the password, please contact the hospital operator. Locate the [facility] provider you are looking for under [facility] and page to a number that you can be directly reached. If you still have difficulty reaching the provider, please page the [facility] (Director on Call) for the Hospitalists listed on amion for assistance.  03/22/2021, 2:30 PM   This document was prepared using [employer] and may contain some unintended transcription

## 2021-03-22 NOTE — ED Triage Notes (Signed)
BIBA from home.  Per EMS pt presents with c/o SOB, cough, and congestion x 2 days.  EMS also reports 1 episode of emesis this morning. 4 mg Zofran given with improvement.   99/56 HR 109 96% 4LNC End tidal 39 CBG 182 18 G left forearm.

## 2021-03-22 NOTE — Plan of Care (Signed)
°  Problem: Clinical Measurements: Goal: Ability to maintain a body temperature in the normal range will improve Outcome: Progressing   Problem: Respiratory: Goal: Ability to maintain adequate ventilation will improve Outcome: Progressing Goal: Ability to maintain a clear airway will improve Outcome: Progressing   Problem: Education: Goal: Knowledge of General Education information will improve Description: Including pain rating scale, medication(s)/side effects and non-pharmacologic comfort measures Outcome: Progressing   Problem: Coping: Goal: Level of anxiety will decrease Outcome: Progressing   Problem: Pain Managment: Goal: General experience of comfort will improve Outcome: Progressing   Problem: Safety: Goal: Ability to remain free from injury will improve Outcome: Progressing   Problem: Skin Integrity: Goal: Risk for impaired skin integrity will decrease Outcome: Progressing   Problem: Respiratory: Goal: Ability to maintain adequate ventilation will improve Outcome: Progressing

## 2021-03-22 NOTE — ED Provider Notes (Signed)
[facility] DEPT Provider Note   CSN: [id number] Arrival date & time: 03/22/21  1127     History  Chief Complaint  Patient presents with   Shortness of Breath   Nasal Congestion   Cough    Angela [name] Ward Angela [name] is a 88 y.o. female.  88 year old female presents with 2 days of cough congestion or shortness of breath.  Had 1 episode of emesis today.  Has had increasing weakness as well 2.  Denies any diarrhea.  Notes chest tightness.  No urinary symptoms.  EMS called given for Zofran and transported here      Home Medications Prior to Admission medications   Medication Sig Start Date End Date Taking? Authorizing Provider  alendronate (FOSAMAX) 70 MG tablet TAKE 1 TAB ONCE A WEEK, AT LEAST 30 MIN BEFORE 1ST FOOD.DO NOT LIE DOWN FOR 30 MIN AFTER TAKING. Patient not taking: Reported on 01/13/2021 12/18/20   [name], MD  apixaban (ELIQUIS) 5 MG TABS tablet Take 1 tablet (5 mg total) by mouth 2 (two) times daily. 12/29/20   [name], MD  benazepril-hydrochlorthiazide (LOTENSIN HCT) 20-12.5 MG tablet Take 1 tablet by mouth daily. 12/29/20   [name], MD  diclofenac Sodium (VOLTAREN) 1 % GEL Apply 1 application topically 4 (four) times daily. 08/29/20   [provider]  fluorouracil (EFUDEX) 5 % cream Apply topically 2 (two) times daily. Patient not taking: Reported on 01/13/2021 04/23/20   [provider]  furosemide (LASIX) 20 MG tablet Take 0.5-1 tablets (10-20 mg total) by mouth daily as needed for fluid or edema (If weight gain by 2lbs in 24 hours). 12/03/19   [name], MD  isosorbide mononitrate (IMDUR) 30 MG 24 hr tablet Take 1 tablet (30 mg total) by mouth daily. 12/29/20   [name], MD  levothyroxine (SYNTHROID) 125 MCG tablet Take 1 tablet (125 mcg total) by mouth daily. 01/13/21   [name], MD  LORazepam (ATIVAN) 0.5 MG tablet TAKE (1) TABLET TWICE DAILY AS NEEDED FOR ANXIETY. Patient not taking: Reported on  01/13/2021 07/08/20   [name], MD  Multiple Vitamins-Minerals (ICAPS AREDS 2 PO) Take 1 tablet by mouth daily.  Patient not taking: Reported on 01/13/2021    [provider]  pantoprazole (PROTONIX) 40 MG tablet Take 1 tablet (40 mg total) by mouth daily. 12/30/20   [name], MD  polyethylene glycol powder (GLYCOLAX/MIRALAX) 17 GM/SCOOP powder DISSOLVE ONE CAPFUL (17 GM) IN LIQUID AND DRINK THREE TIMES DAILY AS NEEDED FOR MODERATE CONSTIPATION 03/16/21   [name], MD  potassium chloride (KLOR-CON) 10 MEQ tablet TAKE 1 TABLET ONCE DAILY. 12/18/20   [name], MD  PRESCRIPTION MEDICATION Inject 1 application as directed every 30 (thirty) days. Cortisone injections, each knee    [provider]  VENTOLIN HFA 108 (90 Base) MCG/ACT inhaler INHALE 2 PUFFS INTO THE LUNGS 4 TIMES A DAY FOR WHEEZING OR SHORTNESS OF BREATH. 03/20/21   [name], MD  Vitamin D, Ergocalciferol, (DRISDOL) 1.25 MG (50000 UNIT) CAPS capsule TAKE ONE CAPSULE ONCE A WEEK 03/16/21   [name], MD      Allergies    Crestor [rosuvastatin calcium], Gadolinium, Pravastatin, Simvastatin, and Penicillins    Review of Systems   Review of Systems  All other systems reviewed and are negative.  Physical Exam Updated Vital Signs BP 101/60 (BP Location: Right Arm)    Pulse (!) 119    Temp (!) 102.2  F (39 C) (Rectal) Comment: Nurse advised at bedside.   Resp (!) 26    Ht 1.727 m (5\' 8" )    Wt 90.7 kg    SpO2 92%    BMI 30.40 kg/m  Physical Exam Vitals and nursing note reviewed.  Constitutional:      General: She is not in acute distress.    Appearance: Normal appearance. She is well-developed. She is not toxic-appearing.  HENT:     Head: Normocephalic and atraumatic.  Eyes:     General: Lids are normal.     Conjunctiva/sclera: Conjunctivae normal.     Pupils: Pupils are equal, round, and reactive to light.  Neck:     Thyroid: No thyroid mass.     Trachea: No tracheal deviation.   Cardiovascular:     Rate and Rhythm: Regular rhythm. Tachycardia present.     Heart sounds: Normal heart sounds. No murmur heard.   No gallop.  Pulmonary:     Effort: Tachypnea and respiratory distress present.     Breath sounds: No stridor. Examination of the right-lower field reveals decreased breath sounds and rhonchi. Examination of the left-lower field reveals decreased breath sounds and rhonchi. Decreased breath sounds and rhonchi present. No wheezing or rales.  Abdominal:     General: There is no distension.     Palpations: Abdomen is soft.     Tenderness: There is no abdominal tenderness. There is no rebound.  Musculoskeletal:        General: No tenderness. Normal range of motion.     Cervical back: Normal range of motion and neck supple.  Skin:    General: Skin is warm and dry.     Findings: No abrasion or rash.  Neurological:     Mental Status: She is alert and oriented to person, place, and time. Mental status is at baseline.     GCS: GCS eye subscore is 4. GCS verbal subscore is 5. GCS motor subscore is 6.     Cranial Nerves: No cranial nerve deficit.     Sensory: No sensory deficit.     Motor: Motor function is intact.  Psychiatric:        Attention and Perception: Attention normal.        Speech: Speech normal.        Behavior: Behavior normal.    ED Results / Procedures / Treatments   Labs (all labs ordered are listed, but only abnormal results are displayed) Labs Reviewed  RESP PANEL BY RT-PCR (FLU A&B, COVID) ARPGX2  CULTURE, BLOOD (ROUTINE X 2)  CULTURE, BLOOD (ROUTINE X 2)  URINE CULTURE  LACTIC ACID, PLASMA  LACTIC ACID, PLASMA  COMPREHENSIVE METABOLIC PANEL  CBC WITH DIFFERENTIAL/PLATELET  PROTIME-INR  APTT  URINALYSIS, ROUTINE W REFLEX MICROSCOPIC    EKG EKG Interpretation  Date/Time:  Sunday March 22 2021 12:11:23 EST Ventricular Rate:  114 PR Interval:  155 QRS Duration: 95 QT Interval:  332 QTC Calculation: 458 R Axis:   -73 Text  Interpretation: Sinus tachycardia Multiple premature complexes, vent & supraven Left anterior fascicular block Abnormal R-wave progression, late transition Baseline wander in lead(s) V5 Confirmed by Lacretia Leigh ([id number]) on 03/22/2021 12:26:28 PM  Radiology No results found.  Procedures Procedures  Patient is EKG consistent with sinus tachycardia.  No signs of ischemia  Medications Ordered in ED Medications  lactated ringers infusion (has no administration in time range)  lactated ringers bolus 1,000 mL (has no administration in time range)    And  lactated ringers bolus 1,000 mL (has no administration in time range)    And  lactated ringers bolus 1,000 mL (has no administration in time range)  levofloxacin (LEVAQUIN) IVPB 750 mg (has no administration in time range)  acetaminophen (TYLENOL) suppository 650 mg (has no administration in time range)    ED Course/ Medical Decision Making/ A&P                           Medical Decision Making Patient presented with acute shortness of breath concerning for pneumonia.  Was placed on supplemental oxygen here.  Code sepsis initiated due to her severe state.  Was able to protect her airway.  Significant leukocytosis noted on CBC concerning for an infection which is consistent with her likely sepsis presentation.  Lactic acid also elevated confirming likely sepsis.  Patient started on empiric antibiotics and given IV fluid bolus.  Patient reassessed multiple times and able to protect her airway.  Patient actually respiratory status is improved.  Chest x-ray consistent with pneumonia.  Patient will be admitted to the hospital service  Amount and/or Complexity of Data Reviewed Independent Historian: caregiver and EMS External Data Reviewed: ECG and notes. Labs: ordered. Decision-making details documented in ED Course. Radiology: ordered and independent interpretation performed.    Details: Consistent likely pneumonia Discussion of management or  test interpretation with external provider(s): Case discussed with hospitalist who will admit the patient  Critical Care Total time providing critical care: 30-74 minutes          Final Clinical Impression(s) / ED Diagnoses Final diagnoses:  None    Rx / DC Orders ED Discharge Orders     None         Lacretia Leigh, MD 03/22/21 1343

## 2021-03-22 NOTE — Progress Notes (Signed)
A consult was received from an ED physician for [place] per pharmacy dosing for CAP.  The patient's profile has been reviewed for ht/wt/allergies/indication/available labs.    PCN listed in allergy section with note indicating she tolerated amoxicillin in the past.  Chart review also indicated she received unasyn in 2020.  Patient should be able to get ceftriaxone and azithromycin if to continue with abx.  A one time order has been placed for Levaquin 750mg  x1 already given to pt at 1218.  Further antibiotics/pharmacy consults should be ordered by admitting physician if indicated.                       Thank you, Lynelle Doctor 03/22/2021  12:20 PM

## 2021-03-23 ENCOUNTER — Inpatient Hospital Stay (HOSPITAL_COMMUNITY): Payer: Medicare Other

## 2021-03-23 ENCOUNTER — Encounter (HOSPITAL_COMMUNITY): Payer: Self-pay | Admitting: Internal Medicine

## 2021-03-23 DIAGNOSIS — J189 Pneumonia, unspecified organism: Secondary | ICD-10-CM | POA: Diagnosis not present

## 2021-03-23 DIAGNOSIS — E876 Hypokalemia: Secondary | ICD-10-CM

## 2021-03-23 DIAGNOSIS — I517 Cardiomegaly: Secondary | ICD-10-CM | POA: Diagnosis not present

## 2021-03-23 DIAGNOSIS — E785 Hyperlipidemia, unspecified: Secondary | ICD-10-CM

## 2021-03-23 DIAGNOSIS — R9431 Abnormal electrocardiogram [ECG] [EKG]: Secondary | ICD-10-CM | POA: Diagnosis not present

## 2021-03-23 DIAGNOSIS — R011 Cardiac murmur, unspecified: Secondary | ICD-10-CM | POA: Diagnosis not present

## 2021-03-23 DIAGNOSIS — E039 Hypothyroidism, unspecified: Secondary | ICD-10-CM

## 2021-03-23 DIAGNOSIS — K219 Gastro-esophageal reflux disease without esophagitis: Secondary | ICD-10-CM | POA: Diagnosis not present

## 2021-03-23 DIAGNOSIS — I10 Essential (primary) hypertension: Secondary | ICD-10-CM

## 2021-03-23 DIAGNOSIS — I2699 Other pulmonary embolism without acute cor pulmonale: Secondary | ICD-10-CM

## 2021-03-23 LAB — CBC WITH DIFFERENTIAL/PLATELET
Abs Immature Granulocytes: 0.25 10*3/uL — ABNORMAL HIGH (ref 0.00–0.07)
Basophils Absolute: 0.1 10*3/uL (ref 0.0–0.1)
Basophils Relative: 0 %
Eosinophils Absolute: 0.1 10*3/uL (ref 0.0–0.5)
Eosinophils Relative: 0 %
HCT: 38.4 % (ref 36.0–46.0)
Hemoglobin: 12.9 g/dL (ref 12.0–15.0)
Immature Granulocytes: 1 %
Lymphocytes Relative: 11 %
Lymphs Abs: 2.2 10*3/uL (ref 0.7–4.0)
MCH: 31.9 pg (ref 26.0–34.0)
MCHC: 33.6 g/dL (ref 30.0–36.0)
MCV: 94.8 fL (ref 80.0–100.0)
Monocytes Absolute: 1.8 10*3/uL — ABNORMAL HIGH (ref 0.1–1.0)
Monocytes Relative: 9 %
Neutro Abs: 15.7 10*3/uL — ABNORMAL HIGH (ref 1.7–7.7)
Neutrophils Relative %: 79 %
Platelets: 201 10*3/uL (ref 150–400)
RBC: 4.05 MIL/uL (ref 3.87–5.11)
RDW: 13.3 % (ref 11.5–15.5)
WBC: 20.1 10*3/uL — ABNORMAL HIGH (ref 4.0–10.5)
nRBC: 0 % (ref 0.0–0.2)

## 2021-03-23 LAB — URINALYSIS, ROUTINE W REFLEX MICROSCOPIC
Bilirubin Urine: NEGATIVE
Glucose, UA: NEGATIVE mg/dL
Hgb urine dipstick: NEGATIVE
Ketones, ur: 5 mg/dL — AB
Leukocytes,Ua: NEGATIVE
Nitrite: NEGATIVE
Protein, ur: NEGATIVE mg/dL
Specific Gravity, Urine: 1.019 (ref 1.005–1.030)
pH: 5 (ref 5.0–8.0)

## 2021-03-23 LAB — LACTIC ACID, PLASMA: Lactic Acid, Venous: 1.3 mmol/L (ref 0.5–1.9)

## 2021-03-23 LAB — COMPREHENSIVE METABOLIC PANEL
ALT: 13 U/L (ref 0–44)
AST: 19 U/L (ref 15–41)
Albumin: 2.7 g/dL — ABNORMAL LOW (ref 3.5–5.0)
Alkaline Phosphatase: 87 U/L (ref 38–126)
Anion gap: 8 (ref 5–15)
BUN: 15 mg/dL (ref 8–23)
CO2: 29 mmol/L (ref 22–32)
Calcium: 8 mg/dL — ABNORMAL LOW (ref 8.9–10.3)
Chloride: 97 mmol/L — ABNORMAL LOW (ref 98–111)
Creatinine, Ser: 1.02 mg/dL — ABNORMAL HIGH (ref 0.44–1.00)
GFR, Estimated: 54 mL/min — ABNORMAL LOW (ref >60)
Glucose, Bld: 104 mg/dL — ABNORMAL HIGH (ref 70–99)
Potassium: 4.1 mmol/L (ref 3.5–5.1)
Sodium: 134 mmol/L — ABNORMAL LOW (ref 135–145)
Total Bilirubin: 1.2 mg/dL (ref 0.3–1.2)
Total Protein: 5.4 g/dL — ABNORMAL LOW (ref 6.5–8.1)

## 2021-03-23 LAB — GLUCOSE, CAPILLARY
Glucose-Capillary: 96 mg/dL (ref 70–99)
Glucose-Capillary: 104 mg/dL — ABNORMAL HIGH (ref 70–99)
Glucose-Capillary: 103 mg/dL — ABNORMAL HIGH (ref 70–99)
Glucose-Capillary: 125 mg/dL — ABNORMAL HIGH (ref 70–99)

## 2021-03-23 LAB — HEMOGLOBIN A1C
Hgb A1c MFr Bld: 5.8 % — ABNORMAL HIGH (ref 4.8–5.6)
Mean Plasma Glucose: 119.76 mg/dL

## 2021-03-23 LAB — MAGNESIUM: Magnesium: 2.5 mg/dL — ABNORMAL HIGH (ref 1.7–2.4)

## 2021-03-23 LAB — ECHOCARDIOGRAM LIMITED
Height: 68 in
Weight: 3992.97 oz

## 2021-03-23 MED ORDER — ORAL CARE MOUTH RINSE
15.0000 mL | Freq: Two times a day (BID) | OROMUCOSAL | Status: DC
  Administered 2021-03-23 – 2021-03-29 (×13): 15 mL via OROMUCOSAL

## 2021-03-23 MED ORDER — LORATADINE 10 MG PO TABS
10.0000 mg | ORAL_TABLET | Freq: Every day | ORAL | Status: DC
  Administered 2021-03-23 – 2021-03-29 (×7): 10 mg via ORAL
  Filled 2021-03-23 (×7): qty 1

## 2021-03-23 MED ORDER — SALINE SPRAY 0.65 % NA SOLN
1.0000 | NASAL | Status: DC | PRN
  Administered 2021-03-23: 1 via NASAL
  Filled 2021-03-23: qty 44

## 2021-03-23 MED ORDER — FLUTICASONE PROPIONATE 50 MCG/ACT NA SUSP
2.0000 | Freq: Every day | NASAL | Status: DC
  Administered 2021-03-23 – 2021-03-29 (×7): 2 via NASAL
  Filled 2021-03-23: qty 16

## 2021-03-23 MED ORDER — GUAIFENESIN ER 600 MG PO TB12
600.0000 mg | ORAL_TABLET | Freq: Two times a day (BID) | ORAL | Status: DC
  Administered 2021-03-23 – 2021-03-29 (×13): 600 mg via ORAL
  Filled 2021-03-23 (×13): qty 1

## 2021-03-23 MED ORDER — SODIUM CHLORIDE 0.9 % IV SOLN: INTRAVENOUS | Status: DC

## 2021-03-23 NOTE — Plan of Care (Signed)
Pt weaned to RA. UA/UC collected.   Problem: Activity: Goal: Ability to tolerate increased activity will improve Outcome: Progressing   Problem: Clinical Measurements: Goal: Ability to maintain a body temperature in the normal range will improve Outcome: Progressing   Problem: Respiratory: Goal: Ability to maintain adequate ventilation will improve Outcome: Progressing Goal: Ability to maintain a clear airway will improve Outcome: Progressing   Problem: Education: Goal: Knowledge of General Education information will improve Description: Including pain rating scale, medication(s)/side effects and non-pharmacologic comfort measures Outcome: Progressing   Problem: Health Behavior/Discharge Planning: Goal: Ability to manage health-related needs will improve Outcome: Progressing   Problem: Clinical Measurements: Goal: Ability to maintain clinical measurements within normal limits will improve Outcome: Progressing Goal: Will remain free from infection Outcome: Progressing Goal: Diagnostic test results will improve Outcome: Progressing Goal: Respiratory complications will improve Outcome: Progressing Goal: Cardiovascular complication will be avoided Outcome: Progressing   Problem: Activity: Goal: Risk for activity intolerance will decrease Outcome: Progressing   Problem: Nutrition: Goal: Adequate nutrition will be maintained Outcome: Progressing   Problem: Coping: Goal: Level of anxiety will decrease Outcome: Progressing   Problem: Elimination: Goal: Will not experience complications related to bowel motility Outcome: Progressing Goal: Will not experience complications related to urinary retention Outcome: Progressing   Problem: Pain Managment: Goal: General experience of comfort will improve Outcome: Progressing   Problem: Safety: Goal: Ability to remain free from injury will improve Outcome: Progressing   Problem: Skin Integrity: Goal: Risk for impaired  skin integrity will decrease Outcome: Progressing   Problem: Fluid Volume: Goal: Hemodynamic stability will improve Outcome: Progressing   Problem: Clinical Measurements: Goal: Diagnostic test results will improve Outcome: Progressing Goal: Signs and symptoms of infection will decrease Outcome: Progressing   Problem: Respiratory: Goal: Ability to maintain adequate ventilation will improve Outcome: Progressing

## 2021-03-23 NOTE — Progress Notes (Signed)
PROGRESS NOTE    Angela [name]  EXH:[id number] DOB: 08-Mar-1933 DOA: 03/22/2021 PCP: [name], MD    Chief Complaint  Patient presents with   Shortness of Breath   Nasal Congestion   Cough    Brief Narrative:  Patient is a pleasant 88 year old female history of osteoarthritis, breast cancer, aortic atherosclerosis, type 2 diabetes, GERD, hypertension, history of PE presented to the ED with dyspnea associated fever, night sweats, cough, congestion, fatigue, malaise, decreased appetite x2 to 3 days.  Patient noted to have 2 episodes of emesis.  Patient with a productive cough.  Patient also with complaints of some pleuritic chest pain.  On presentation patient noted to have a temp of 102.2, borderline blood pressure, sats of 92% on room air, tachycardic, tachypneic.  Chest x-ray done on admission concerning for left lower lobe infiltrate with small pleural effusion.  Patient admitted, pancultured, placed empirically on IV antibiotics for CAP.   Assessment & Plan:   Principal Problem:   Sepsis due to pneumonia [facility]) Active Problems:   Hypertension   Dyslipidemia   Prediabetes   GERD (gastroesophageal reflux disease)   Hypothyroidism (acquired)   Pulmonary embolism without acute cor pulmonale (HCC)   Cardiomegaly   Hypokalemia   Hyperbilirubinemia   Hypocalcemia   Hypomagnesemia  #1 sepsis secondary to CAP, POA -Patient presented with criteria meeting sepsis with fever, tachycardia, tachypnea, borderline blood pressure, chest x-ray consistent with a left lower lobe pneumonia. -Patient improving slowly clinically. -Sputum gram stain and culture pending. -Urine Legionella antigen, urine pneumococcus antigen pending. -Blood cultures pending. -COVID-19 PCR negative, influenza A and B PCR negative -Continue empiric IV Rocephin, IV azithromycin. -Place on Mucinex, Flonase, Claritin. -Labs as needed.  2.  Hypokalemia/hypomagnesemia -Repleted.  3.  Hypertension -Blood  pressure borderline. -Discontinue benazepril, HCTZ, Imdur. -Once blood pressure improves can resume home regimen of antihypertensive medications.  4.  Dyslipidemia -Not on medical therapy prior to admission. -Outpatient follow-up.  5.  GERD -PPI.  6.  Hypothyroidism -Continue home regimen Synthroid. -Outpatient follow-up.  7.  PE without acute cor pulmonale -Continue Eliquis twice daily.  8.  Hyperbilirubinemia -Improved.  9.  Cardiomegaly -2D echo pending.  10.  Pseudohypocalcemia -Corrected calcium at 9.04.  DVT prophylaxis: Eliquis Code Status: Full Family Communication: Updated patient and caretaker at bedside Disposition:   Status is: Inpatient  Remains inpatient appropriate because: Severity of illness       Consultants:  None  Procedures:  Chest x-ray 03/22/2021  Antimicrobials:  IV azithromycin 03/23/2021>>>> IV Rocephin 03/23/2021>>>> IV Levaquin 03/22/2021 x 1 dose.   Subjective: Patient sitting up in recliner.  Still with some shortness of breath however.  Caretaker at bedside who feels patient has improved since admission.  No chest pain.  No abdominal pain.  Objective: Vitals:   03/22/21 1637 03/22/21 2013 03/22/21 2350 03/23/21 0400  BP: 124/67 110/60 (!) 92/58 (!) 94/54  Pulse: 93 78 69 63  Resp: 20 20 18 14   Temp: 98.2 F (36.8 C) 98.2 F (36.8 C) 98 F (36.7 C) 98 F (36.7 C)  TempSrc: Oral  Oral Oral  SpO2: 97% 100% 100% 100%  Weight: 113.2 kg     Height: 5\' 8"  (1.727 m)       Intake/Output Summary (Last 24 hours) at 03/23/2021 1303 Last data filed at 03/23/2021 1013 Gross per 24 hour  Intake 2726.7 ml  Output --  Net 2726.7 ml   Filed Weights   03/22/21 1143 03/22/21 1637  Weight:  90.7 kg 113.2 kg    Examination:  General exam: Appears calm and comfortable  Respiratory system: Coarse bibasilar breath sounds.  No wheezing.  Fair air movement.  Speaking in full sentences.   Cardiovascular system: S1 & S2 heard, RRR. No JVD,  murmurs, rubs, gallops or clicks. No pedal edema. Gastrointestinal system: Abdomen is nondistended, soft and nontender. No organomegaly or masses felt. Normal bowel sounds heard. Central nervous system: Alert and oriented. No focal neurological deficits. Extremities: Symmetric 5 x 5 power. Skin: No rashes, lesions or ulcers Psychiatry: Judgement and insight appear fair. Mood & affect appropriate.     Data Reviewed: I have personally reviewed following labs and imaging studies  CBC: Recent Labs  Lab 03/22/21 1210 03/23/21 0414  WBC 26.9* 20.1*  NEUTROABS 22.3* 15.7*  HGB 16.5* 12.9  HCT 48.0* 38.4  MCV 92.1 94.8  PLT 307 831    Basic Metabolic Panel: Recent Labs  Lab 03/22/21 1210 03/23/21 0412 03/23/21 0414  NA 132*  --  134*  K 2.8*  --  4.1  CL 94*  --  97*  CO2 30  --  29  GLUCOSE 163*  --  104*  BUN 8  --  15  CREATININE 0.73  --  1.02*  CALCIUM 8.2*  --  8.0*  MG 1.1* 2.5*  --   PHOS 2.9  --   --     GFR: Estimated Creatinine Clearance: 52.3 mL/min (A) (by C-G formula based on SCr of 1.02 mg/dL (H)).  Liver Function Tests: Recent Labs  Lab 03/22/21 1210 03/23/21 0414  AST 26 19  ALT 20 13  ALKPHOS 115 87  BILITOT 2.3* 1.2  PROT 7.3 5.4*  ALBUMIN 3.7 2.7*    CBG: Recent Labs  Lab 03/22/21 1838 03/22/21 2203 03/23/21 0753 03/23/21 1235  GLUCAP 113* 137* 96 104*     Recent Results (from the past 240 hour(s))  Resp Panel by RT-PCR (Flu A&B, Covid) Nasopharyngeal Swab     Status: None   Collection Time: 03/22/21 12:10 PM   Specimen: Nasopharyngeal Swab; Nasopharyngeal(NP) swabs in vial transport medium  Result Value Ref Range Status   SARS Coronavirus 2 by RT PCR NEGATIVE NEGATIVE Final    Comment: (NOTE) SARS-CoV-2 target nucleic acids are NOT DETECTED.  The SARS-CoV-2 RNA is generally detectable in upper respiratory specimens during the acute phase of infection. The lowest concentration of SARS-CoV-2 viral copies this assay can detect  is 138 copies/mL. A negative result does not preclude SARS-Cov-2 infection and should not be used as the sole basis for treatment or other patient management decisions. A negative result may occur with  improper specimen collection/handling, submission of specimen other than nasopharyngeal swab, presence of viral mutation(s) within the areas targeted by this assay, and inadequate number of viral copies(<138 copies/mL). A negative result must be combined with clinical observations, patient history, and epidemiological information. The expected result is Negative.  Fact Sheet for Patients:  EntrepreneurPulse.com.au  Fact Sheet for Healthcare Providers:  IncredibleEmployment.be  This test is no t yet approved or cleared by the [place] FDA and  has been authorized for detection and/or diagnosis of SARS-CoV-2 by FDA under an Emergency Use Authorization (EUA). This EUA will remain  in effect (meaning this test can be used) for the duration of the COVID-19 declaration under Section 564(b)(1) of the Act, 21 U.S.C.section 360bbb-3(b)(1), unless the authorization is terminated  or revoked sooner.       Influenza A by PCR NEGATIVE  NEGATIVE Final   Influenza B by PCR NEGATIVE NEGATIVE Final    Comment: (NOTE) The Xpert Xpress SARS-CoV-2/FLU/RSV plus assay is intended as an aid in the diagnosis of influenza from Nasopharyngeal swab specimens and should not be used as a sole basis for treatment. Nasal washings and aspirates are unacceptable for Xpert Xpress SARS-CoV-2/FLU/RSV testing.  Fact Sheet for Patients: EntrepreneurPulse.com.au  Fact Sheet for Healthcare Providers: IncredibleEmployment.be  This test is not yet approved or cleared by the [place] FDA and has been authorized for detection and/or diagnosis of SARS-CoV-2 by FDA under an Emergency Use Authorization (EUA). This EUA will remain in effect  (meaning this test can be used) for the duration of the COVID-19 declaration under Section 564(b)(1) of the Act, 21 U.S.C. section 360bbb-3(b)(1), unless the authorization is terminated or revoked.  Performed at [facility], [place] [address] [place], [place] [address]   Blood Culture (routine x 2)     Status: None (Preliminary result)   Collection Time: 03/22/21 12:10 PM   Specimen: BLOOD  Result Value Ref Range Status   Specimen Description   Final    BLOOD BLOOD LEFT FOREARM Performed at [place] [address], [place], [place] [address]    Special Requests   Final    BOTTLES DRAWN AEROBIC AND ANAEROBIC Blood Culture adequate volume Performed at [facility] [address], [place], [place] [address]    Culture   Final    NO GROWTH < 24 HOURS Performed at [facility] [address] [place], [place] [address]    Report Status PENDING  Incomplete  Blood Culture (routine x 2)     Status: None (Preliminary result)   Collection Time: 03/22/21 12:15 PM   Specimen: BLOOD  Result Value Ref Range Status   Specimen Description   Final    BLOOD RIGHT ANTECUBITAL Performed at [place] [facility] [address] [place], [place] [address]    Special Requests   Final    BOTTLES DRAWN AEROBIC AND ANAEROBIC Blood Culture adequate volume Performed at [facility] [address], [place], [place] [address]    Culture   Final    NO GROWTH < 24 HOURS Performed at [facility] [address], [place], [place] [address]    Report Status PENDING  Incomplete         Radiology Studies: DG Chest Port 1 View  Result Date: 03/22/2021 CLINICAL DATA:  88 year old female with history of shortness of breath and cough. EXAM: PORTABLE CHEST 1 VIEW COMPARISON:  Chest x-ray 03/02/2021. FINDINGS: Opacity in the periphery of the left mid to lower lung concerning for airspace consolidation.  Small left pleural effusion. Right lung is clear. No pneumothorax. No evidence of pulmonary edema. Heart size is mildly enlarged. The patient is rotated to the left on today's exam, resulting in distortion of the mediastinal contours and reduced diagnostic sensitivity and specificity for mediastinal pathology. Atherosclerotic calcifications in the thoracic aorta. IMPRESSION: 1. Atelectasis and/or consolidation in the left lower lobe with small left pleural effusion. 2. Mild cardiomegaly. 3. Aortic atherosclerosis. Electronically Signed   By: Vinnie Langton M.D.   On: 03/22/2021 13:24   ECHOCARDIOGRAM LIMITED  Result Date: 03/23/2021    ECHOCARDIOGRAM REPORT   Patient Name:   Angela [name] Date of Exam: 03/23/2021 Medical Rec #:  [id number]          Height:  68.0 in Accession #:    [id number]         Weight:       249.6 lb Date of Birth:  Mar 18, 1933          BSA:          2.245 m Patient Age:    14 years           BP:           94/54 mmHg Patient Gender: F                  HR:           56 bpm. Exam Location:  Inpatient Procedure: 2D Echo, Limited Echo and Color Doppler Indications:    Abnormal EKG, Murmur  History:        Patient has no prior history of Echocardiogram examinations.                 Risk Factors:Hypertension and Dyslipidemia.  Sonographer:    Jyl Heinz Referring Phys: [id number] DAVID MANUEL [place]  1. Very limited echo due to poor visualization, overall nondiagnostic. From technician notes visualization limited by breast implants and inability to press on chest due to pain. . Left ventricular ejection fraction, by estimation, is unable to assess, poor visualization%. The left ventricle has unable to assess, poor visualization function. Left ventricular endocardial border not optimally defined to evaluate regional wall motion. The left ventricular internal cavity size was not well visualized. Left  ventricular diastolic function could not be evaluated.  2. Right ventricular  systolic function was not well visualized. The right ventricular size is not well visualized.  3. The mitral valve was not well visualized. not well visualized mitral valve regurgitation. unable to assess mitral stenosis.  4. Tricuspid valve regurgitation unable to assess. unable to assess tricuspid stenosis.  5. The aortic valve was not well visualized. Aortic valve regurgitation not able to assess. not able to assess.  6. Pulmonic valve regurgitation unable to assess. FINDINGS  Left Ventricle: Very limited echo due to poor visualization, overall nondiagnostic. From technician notes visualization limited by breast implants and inability to press on chest due to pain. Left ventricular ejection fraction, by estimation, is unable to assess, poor visualization%. The left ventricle has unable to assess, poor visualization function. Left ventricular endocardial border not optimally defined to evaluate regional wall motion. The left ventricular internal cavity size was not well visualized. Suboptimal image quality limits for assessment of left ventricular hypertrophy. Left ventricular diastolic function could not be evaluated. Right Ventricle: The right ventricular size is not well visualized. Right vetricular wall thickness was not well visualized. Right ventricular systolic function was not well visualized. Left Atrium: Left atrial size was not well visualized. Right Atrium: Right atrial size was not well visualized. Pericardium: The pericardium was not well visualized. Mitral Valve: The mitral valve was not well visualized. Not well visualized mitral valve regurgitation. Unable to assess mitral valve stenosis. Tricuspid Valve: The tricuspid valve is not well visualized. Tricuspid valve regurgitation unable to assess. unable to assess tricuspid stenosis. Aortic Valve: The aortic valve was not well visualized. Aortic valve regurgitation not able to assess. Not able to assess. Pulmonic Valve: The pulmonic valve was not  well visualized. Pulmonic valve regurgitation unable to assess. Aorta: The aortic root was not well visualized. IAS/Shunts: The interatrial septum was not well visualized. Carlyle Dolly MD Electronically signed by Carlyle Dolly MD Signature Date/Time: 03/23/2021/10:50:11 AM    Final  Scheduled Meds:  apixaban  5 mg Oral BID   fluticasone  2 spray Each Nare Daily   guaiFENesin  600 mg Oral BID   insulin aspart  0-15 Units Subcutaneous TID WC   levothyroxine  125 mcg Oral Daily   loratadine  10 mg Oral Daily   mouth rinse  15 mL Mouth Rinse BID   pantoprazole  40 mg Oral Daily   Continuous Infusions:  sodium chloride 100 mL/hr at 03/23/21 1028   azithromycin     cefTRIAXone (ROCEPHIN)  IV 2 g (03/23/21 1223)     LOS: 1 day    Time spent: 40 minutes    Irine Seal, MD [facility]   To contact the attending provider between 7A-7P or the covering provider during after hours 7P-7A, please log into the web site [email or web address] and access using universal  password for that web site. If you do not have the password, please call the hospital operator.  03/23/2021, 1:03 PM

## 2021-03-23 NOTE — Progress Notes (Signed)
Nurse had notified on call provider about patient having low BP during the nightshift.

## 2021-03-23 NOTE — Evaluation (Signed)
Physical Therapy Evaluation Patient Details Name: Angela [name] MRN: [id number] DOB: May 29, 1932 Today's Date: 03/23/2021  History of Present Illness  88 y.o.female with medical history significant of osteoarthritis, breast cancer, aortic atherosclerosis, type II DM, GERD, hypertension, history of PE who is coming to the emergency department due to dyspnea associated with fever, night sweats, cough, congestion, fatigue, malaise, decreased appetite for the past 2 to 3 days.  Pt admitted 03/22/20 for sepsis due to pneumonia.  Clinical Impression  Pt admitted with above diagnosis. Pt currently with functional limitations due to the deficits listed below (see PT Problem List). Pt will benefit from skilled PT to increase their independence and safety with mobility to allow discharge to the venue listed below.  Pt assisted OOB to recliner today.  Pt typically ambulatory with RW at home.  Pt denies SOB or dizziness with mobility today.  Anticipate pt to d/c home and would benefit from HHPT if agreeable.      Recommendations for follow up therapy are one component of a multi-disciplinary discharge planning process, led by the attending physician.  Recommendations may be updated based on patient status, additional functional criteria and insurance authorization.  Follow Up Recommendations Home health PT    Assistance Recommended at Discharge Intermittent Supervision/Assistance  Functional Status Assessment Patient has had a recent decline in their functional status and demonstrates the ability to make significant improvements in function in a reasonable and predictable amount of time.  Equipment Recommendations  None recommended by PT    Recommendations for Other Services       Precautions / Restrictions Precautions Precautions: Fall      Mobility  Bed Mobility Overal bed mobility: Needs Assistance Bed Mobility: Supine to Sit     Supine to sit: Min assist;HOB elevated     General bed  mobility comments: pt's HHRN in room, [place], and assisted with trunk upright    Transfers Overall transfer level: Needs assistance Equipment used: Rolling walker (2 wheels) Transfers: Sit to/from Stand;Bed to chair/wheelchair/BSC Sit to Stand: Min guard;From elevated surface   Step pivot transfers: Min guard       General transfer comment: pt reports utilizing elevated surfaces at home due to being "tall", min/guard for safety; SPO2 94% on room air after transfer    Ambulation/Gait                  Stairs            Wheelchair Mobility    Modified Rankin (Stroke Patients Only)       Balance                                             Pertinent Vitals/Pain Pain Assessment: No/denies pain    Home Living Family/patient expects to be discharged to:: Private residence Living Arrangements: Alone Available Help at Discharge: Family;Home health Type of Home: House         Home Layout: Other (Comment);Able to live on main level with bedroom/bathroom Home Equipment: [place] (2 wheels) Additional Comments: has stair chair lift    Prior Function Prior Level of Function : Needs assist             Mobility Comments: typically ambulatory with RW, has a HHRN, Nadine       Hand Dominance        Extremity/Trunk Assessment  Lower Extremity Assessment Lower Extremity Assessment: Generalized weakness    Cervical / Trunk Assessment Cervical / Trunk Assessment: Normal  Communication   Communication: No difficulties  Cognition Arousal/Alertness: Awake/alert Behavior During Therapy: WFL for tasks assessed/performed Overall Cognitive Status: Within Functional Limits for tasks assessed                                          General Comments      Exercises     Assessment/Plan    PT Assessment Patient needs continued PT services  PT Problem List Decreased activity tolerance;Decreased  strength;Decreased mobility;Decreased balance;Obesity       PT Treatment Interventions Gait training;DME instruction;Therapeutic exercise;Balance training;Functional mobility training;Therapeutic activities;Patient/family education    PT Goals (Current goals can be found in the Care Plan section)  Acute Rehab PT Goals PT Goal Formulation: With patient Time For Goal Achievement: 04/07/21 Potential to Achieve Goals: Good    Frequency Min 3X/week   Barriers to discharge        Co-evaluation               AM-PAC PT "6 Clicks" Mobility  Outcome Measure Help needed turning from your back to your side while in a flat bed without using bedrails?: A Little Help needed moving from lying on your back to sitting on the side of a flat bed without using bedrails?: A Little Help needed moving to and from a bed to a chair (including a wheelchair)?: A Little Help needed standing up from a chair using your arms (e.g., wheelchair or bedside chair)?: A Little Help needed to walk in hospital room?: A Lot Help needed climbing 3-5 steps with a railing? : A Lot 6 Click Score: 16    End of Session Equipment Utilized During Treatment: Gait belt Activity Tolerance: Patient tolerated treatment well Patient left: with call bell/phone within reach;in chair;with chair alarm set;with family/visitor present Nurse Communication: Mobility status PT Visit Diagnosis: Other abnormalities of gait and mobility (R26.89)    Time: 8299-3716 PT Time Calculation (min) (ACUTE ONLY): 28 min   Charges:   PT Evaluation $PT Eval Low Complexity: 1 Low        Kati PT, DPT Acute Rehabilitation Services Pager: [phone] Office: [place] 03/23/2021, 1:31 PM

## 2021-03-24 DIAGNOSIS — J189 Pneumonia, unspecified organism: Secondary | ICD-10-CM | POA: Diagnosis not present

## 2021-03-24 LAB — CBC WITH DIFFERENTIAL/PLATELET
Abs Immature Granulocytes: 0.19 K/uL — ABNORMAL HIGH (ref 0.00–0.07)
Basophils Absolute: 0.1 K/uL (ref 0.0–0.1)
Basophils Relative: 1 %
Eosinophils Absolute: 0.2 K/uL (ref 0.0–0.5)
Eosinophils Relative: 2 %
HCT: 37.8 % (ref 36.0–46.0)
Hemoglobin: 12.8 g/dL (ref 12.0–15.0)
Immature Granulocytes: 1 %
Lymphocytes Relative: 16 %
Lymphs Abs: 2.3 K/uL (ref 0.7–4.0)
MCH: 31.5 pg (ref 26.0–34.0)
MCHC: 33.9 g/dL (ref 30.0–36.0)
MCV: 93.1 fL (ref 80.0–100.0)
Monocytes Absolute: 1.3 K/uL — ABNORMAL HIGH (ref 0.1–1.0)
Monocytes Relative: 9 %
Neutro Abs: 10.6 K/uL — ABNORMAL HIGH (ref 1.7–7.7)
Neutrophils Relative %: 71 %
Platelets: 188 K/uL (ref 150–400)
RBC: 4.06 MIL/uL (ref 3.87–5.11)
RDW: 13.4 % (ref 11.5–15.5)
WBC: 14.7 K/uL — ABNORMAL HIGH (ref 4.0–10.5)
nRBC: 0 % (ref 0.0–0.2)

## 2021-03-24 LAB — GLUCOSE, CAPILLARY
Glucose-Capillary: 91 mg/dL (ref 70–99)
Glucose-Capillary: 120 mg/dL — ABNORMAL HIGH (ref 70–99)
Glucose-Capillary: 105 mg/dL — ABNORMAL HIGH (ref 70–99)
Glucose-Capillary: 111 mg/dL — ABNORMAL HIGH (ref 70–99)

## 2021-03-24 LAB — COMPREHENSIVE METABOLIC PANEL WITH GFR
ALT: 12 U/L (ref 0–44)
AST: 17 U/L (ref 15–41)
Albumin: 2.4 g/dL — ABNORMAL LOW (ref 3.5–5.0)
Alkaline Phosphatase: 77 U/L (ref 38–126)
Anion gap: 7 (ref 5–15)
BUN: 13 mg/dL (ref 8–23)
CO2: 26 mmol/L (ref 22–32)
Calcium: 7.5 mg/dL — ABNORMAL LOW (ref 8.9–10.3)
Chloride: 101 mmol/L (ref 98–111)
Creatinine, Ser: 0.63 mg/dL (ref 0.44–1.00)
GFR, Estimated: >60 mL/min
Glucose, Bld: 111 mg/dL — ABNORMAL HIGH (ref 70–99)
Potassium: 4 mmol/L (ref 3.5–5.1)
Sodium: 134 mmol/L — ABNORMAL LOW (ref 135–145)
Total Bilirubin: 0.5 mg/dL (ref 0.3–1.2)
Total Protein: 5.2 g/dL — ABNORMAL LOW (ref 6.5–8.1)

## 2021-03-24 LAB — LEGIONELLA PNEUMOPHILA SEROGP 1 UR AG: L. pneumophila Serogp 1 Ur Ag: NEGATIVE

## 2021-03-24 LAB — URINE CULTURE: Culture: NO GROWTH

## 2021-03-24 LAB — MAGNESIUM: Magnesium: 1.6 mg/dL — ABNORMAL LOW (ref 1.7–2.4)

## 2021-03-24 MED ORDER — MAGNESIUM SULFATE 2 GM/50ML IV SOLN
2.0000 g | Freq: Once | INTRAVENOUS | Status: AC
  Administered 2021-03-24: 2 g via INTRAVENOUS
  Filled 2021-03-24: qty 50

## 2021-03-24 MED ORDER — IPRATROPIUM-ALBUTEROL 0.5-2.5 (3) MG/3ML IN SOLN
3.0000 mL | Freq: Four times a day (QID) | RESPIRATORY_TRACT | Status: DC
  Administered 2021-03-24 – 2021-03-25 (×2): 3 mL via RESPIRATORY_TRACT
  Filled 2021-03-24 (×2): qty 3

## 2021-03-24 MED ORDER — AZITHROMYCIN 250 MG PO TABS
500.0000 mg | ORAL_TABLET | Freq: Every day | ORAL | Status: AC
  Administered 2021-03-24 – 2021-03-27 (×4): 500 mg via ORAL
  Filled 2021-03-24 (×5): qty 2

## 2021-03-24 NOTE — TOC Progression Note (Signed)
Transition of Care [facility]) - Progression Note    Patient Details  Name: Angela [name] MRN: [id number] Date of Birth: 1933/02/03  Transition of Care [facility]) CM/SW Contact  Purcell Mouton, RN Phone Number: 03/24/2021, 1:07 PM  Clinical Narrative:     Spoke with pt concerning HHPT. Pt selected [place] referral given to in house rep.  Expected Discharge Plan: [place] Barriers to Discharge: No Barriers Identified  Expected Discharge Plan and Services Expected Discharge Plan: [place] arrangements for the past 2 months: Single Family Home                           HH Arranged: PT [place]: [place] ([facility]) Date HH Agency Contacted: 03/24/21 Time [place]: 2035 Representative spoke with at [place]: [place] ([place]) Interventions    Readmission Risk Interventions No flowsheet data found.

## 2021-03-24 NOTE — Progress Notes (Signed)
PROGRESS NOTE    Angela [name]  [name] DOB: 18-Dec-1932 DOA: 03/22/2021 PCP: Angela Lima, MD    Chief Complaint  Patient presents with   Shortness of Breath   Nasal Congestion   Cough    Brief Narrative:  Patient is a pleasant 88 year old female history of osteoarthritis, breast cancer, aortic atherosclerosis, type 2 diabetes, GERD, hypertension, history of PE presented to the ED with dyspnea associated fever, night sweats, cough, congestion, fatigue, malaise, decreased appetite x2 to 3 days.  Patient noted to have 2 episodes of emesis.  Patient with a productive cough.  Patient also with complaints of some pleuritic chest pain.  On presentation patient noted to have a temp of 102.2, borderline blood pressure, sats of 92% on room air, tachycardic, tachypneic.  Chest x-ray done on admission concerning for left lower lobe infiltrate with small pleural effusion.  Patient admitted, pancultured, placed empirically on IV antibiotics for CAP.   Assessment & Plan:   Principal Problem:   Community acquired pneumonia Active Problems:   Hypertension   Dyslipidemia   Prediabetes   GERD (gastroesophageal reflux disease)   Hypothyroidism (acquired)   Pulmonary embolism without acute cor pulmonale (HCC)   Cardiomegaly   Hypokalemia   Hyperbilirubinemia   Hypocalcemia   Hypomagnesemia  #1 sepsis secondary to CAP, POA -Patient presented with criteria meeting sepsis with fever, tachycardia, tachypnea, borderline blood pressure, chest x-ray consistent with a left lower lobe pneumonia. -Patient improving slowly clinically. -Sputum gram stain and culture pending. -Urine Legionella antigen, urine pneumococcus antigen pending. -Blood cultures pending. -COVID-19 PCR negative, influenza A and B PCR negative -Continue empiric IV Rocephin, IV azithromycin.  -Place on Mucinex, Flonase, Claritin. -Appears to have bilateral expiratory wheezing, tells me that she has history of asthma.   Add nebulizer therapy and check walking saturation tomorrow.  Likely can go home after that.  2.  Hypokalemia/hypomagnesemia -Repleted.  3.  Hypertension -Blood pressure borderline. -Discontinue benazepril, HCTZ, Imdur. -Once blood pressure improves can resume home regimen of antihypertensive medications.  4.  Dyslipidemia -Not on medical therapy prior to admission. -Outpatient follow-up.  5.  GERD -PPI.  6.  Hypothyroidism -Continue home regimen Synthroid. -Outpatient follow-up.  7.  PE without acute cor pulmonale -Continue Eliquis twice daily.  8.  Hyperbilirubinemia -Improved.  9.  Cardiomegaly -2D echo were elevated due to breast implants. Monitor clinically.  10.  Pseudohypocalcemia -Corrected calcium at 9.04.  11.  Bilateral edema. Check Dopplers.  Low concern as the patient is already on anticoagulation.  DVT prophylaxis: Eliquis Code Status: Full Family Communication: Updated patient and caretaker at bedside Disposition:   Status is: Inpatient  Remains inpatient appropriate because: Ongoing bronchospasm treated with nebulizer therapy.  Likely can go home tomorrow.       Consultants:  None  Procedures:  Chest x-ray 03/22/2021  Antimicrobials:  IV azithromycin 03/23/2021>>>> IV Rocephin 03/23/2021>>>> IV Levaquin 03/22/2021 x 1 dose.   Subjective: Continues to have shortness of breath.  Continues to have cough.  No nausea vomiting or no fever no chills.  Also reports bilateral lower extremity pain.  Objective: Vitals:   03/24/21 0147 03/24/21 0558 03/24/21 0801 03/24/21 1308  BP:  (!) 152/62  (!) 137/59  Pulse:  81  75  Resp:  20    Temp:  98.1 F (36.7 C)  98.1 F (36.7 C)  TempSrc:  Oral  Oral  SpO2: 93% 96% 92% 95%  Weight:      Height:  Intake/Output Summary (Last 24 hours) at 03/24/2021 2046 Last data filed at 03/24/2021 1833 Gross per 24 hour  Intake 1273.25 ml  Output 1150 ml  Net 123.25 ml    Filed Weights   03/22/21  1143 03/22/21 1637  Weight: 90.7 kg 113.2 kg    Examination: General: Appear in mild distress, no Rash; Oral Mucosa clear. no Abnormal Neck Mass Or lumps, Conjunctiva normal  Cardiovascular: S1 and S2 Present, no Murmur Respiratory: increased respiratory effort, Bilateral Air entry present and no Crackles, bilateral expiratory wheezes Abdomen: Bowel Sound present, Soft and no tenderness Extremities: bilateral Pedal edema Neurology: alert and oriented to time, place, and person affect appropriate. no new focal deficit Gait not checked due to patient safety concerns       Data Reviewed: I have personally reviewed following labs and imaging studies  CBC: Recent Labs  Lab 03/22/21 1210 03/23/21 0414 03/24/21 0349  WBC 26.9* 20.1* 14.7*  NEUTROABS 22.3* 15.7* 10.6*  HGB 16.5* 12.9 12.8  HCT 48.0* 38.4 37.8  MCV 92.1 94.8 93.1  PLT 307 201 188     Basic Metabolic Panel: Recent Labs  Lab 03/22/21 1210 03/23/21 0412 03/23/21 0414 03/24/21 0349  NA 132*  --  134* 134*  K 2.8*  --  4.1 4.0  CL 94*  --  97* 101  CO2 30  --  29 26  GLUCOSE 163*  --  104* 111*  BUN 8  --  15 13  CREATININE 0.73  --  1.02* 0.63  CALCIUM 8.2*  --  8.0* 7.5*  MG 1.1* 2.5*  --  1.6*  PHOS 2.9  --   --   --      GFR: Estimated Creatinine Clearance: 66.6 mL/min (by C-G formula based on SCr of 0.63 mg/dL).  Liver Function Tests: Recent Labs  Lab 03/22/21 1210 03/23/21 0414 03/24/21 0349  AST 26 19 17   ALT 20 13 12   ALKPHOS 115 87 77  BILITOT 2.3* 1.2 0.5  PROT 7.3 5.4* 5.2*  ALBUMIN 3.7 2.7* 2.4*     CBG: Recent Labs  Lab 03/23/21 1715 03/23/21 2133 03/24/21 0757 03/24/21 1136 03/24/21 1641  GLUCAP 103* 125* 91 120* 105*      Recent Results (from the past 240 hour(s))  Resp Panel by RT-PCR (Flu A&B, Covid) Nasopharyngeal Swab     Status: None   Collection Time: 03/22/21 12:10 PM   Specimen: Nasopharyngeal Swab; Nasopharyngeal(NP) swabs in vial transport medium   Result Value Ref Range Status   SARS Coronavirus 2 by RT PCR NEGATIVE NEGATIVE Final    Comment: (NOTE) SARS-CoV-2 target nucleic acids are NOT DETECTED.  The SARS-CoV-2 RNA is generally detectable in upper respiratory specimens during the acute phase of infection. The lowest concentration of SARS-CoV-2 viral copies this assay can detect is 138 copies/mL. A negative result does not preclude SARS-Cov-2 infection and should not be used as the sole basis for treatment or other patient management decisions. A negative result may occur with  improper specimen collection/handling, submission of specimen other than nasopharyngeal swab, presence of viral mutation(s) within the areas targeted by this assay, and inadequate number of viral copies(<138 copies/mL). A negative result must be combined with clinical observations, patient history, and epidemiological information. The expected result is Negative.  Fact Sheet for Patients:  EntrepreneurPulse.com.au  Fact Sheet for Healthcare Providers:  IncredibleEmployment.be  This test is no t yet approved or cleared by the [place] FDA and  has been authorized for detection  and/or diagnosis of SARS-CoV-2 by FDA under an Emergency Use Authorization (EUA). This EUA will remain  in effect (meaning this test can be used) for the duration of the COVID-19 declaration under Section 564(b)(1) of the Act, 21 U.S.C.section 360bbb-3(b)(1), unless the authorization is terminated  or revoked sooner.       Influenza A by PCR NEGATIVE NEGATIVE Final   Influenza B by PCR NEGATIVE NEGATIVE Final    Comment: (NOTE) The Xpert Xpress SARS-CoV-2/FLU/RSV plus assay is intended as an aid in the diagnosis of influenza from Nasopharyngeal swab specimens and should not be used as a sole basis for treatment. Nasal washings and aspirates are unacceptable for Xpert Xpress SARS-CoV-2/FLU/RSV testing.  Fact Sheet for  Patients: EntrepreneurPulse.com.au  Fact Sheet for Healthcare Providers: IncredibleEmployment.be  This test is not yet approved or cleared by the [place] FDA and has been authorized for detection and/or diagnosis of SARS-CoV-2 by FDA under an Emergency Use Authorization (EUA). This EUA will remain in effect (meaning this test can be used) for the duration of the COVID-19 declaration under Section 564(b)(1) of the Act, 21 U.S.C. section 360bbb-3(b)(1), unless the authorization is terminated or revoked.  Performed at [facility], [place] [address] [place], [place] [address]   Blood Culture (routine x 2)     Status: None (Preliminary result)   Collection Time: 03/22/21 12:10 PM   Specimen: BLOOD  Result Value Ref Range Status   Specimen Description   Final    BLOOD BLOOD LEFT FOREARM Performed at [facility] [address], [place], [place] [address]    Special Requests   Final    BOTTLES DRAWN AEROBIC AND ANAEROBIC Blood Culture adequate volume Performed at [facility] [address] [place], [place] [address]    Culture   Final    NO GROWTH 2 DAYS Performed at [facility] [address], [place], [place] [address]    Report Status PENDING  Incomplete  Blood Culture (routine x 2)     Status: None (Preliminary result)   Collection Time: 03/22/21 12:15 PM   Specimen: BLOOD  Result Value Ref Range Status   Specimen Description   Final    BLOOD RIGHT ANTECUBITAL Performed at [facility] [address], [place], [place] [address]    Special Requests   Final    BOTTLES DRAWN AEROBIC AND ANAEROBIC Blood Culture adequate volume Performed at [facility] [address], [place], [place] [address]    Culture   Final    NO GROWTH 2 DAYS Performed at [facility] [address], [place], [place] [address]    Report Status  PENDING  Incomplete  Urine Culture     Status: None   Collection Time: 03/23/21  2:53 PM   Specimen: Urine, Catheterized  Result Value Ref Range Status   Specimen Description   Final    URINE, CATHETERIZED Performed at [facility] [address], [place], [place] [address]    Special Requests   Final    NONE Performed at [facility], [place] [address], [place], [place] [address]    Culture   Final    NO GROWTH Performed at [facility], [place] [address], [place], [place] [address]    Report Status 03/24/2021 FINAL  Final          Radiology Studies: ECHOCARDIOGRAM LIMITED  Result Date: 03/23/2021  ECHOCARDIOGRAM REPORT   Patient Name:   GLORIAJEAN OKUN [name] Date of Exam: 03/23/2021 Medical Rec #:  [id number]          Height:       68.0 in Accession #:    [id number]         Weight:       249.6 lb Date of Birth:  [birth year]          BSA:          2.245 m Patient Age:    89 years           BP:           94/54 mmHg Patient Gender: F                  HR:           56 bpm. Exam Location:  Inpatient Procedure: 2D Echo, Limited Echo and Color Doppler Indications:    Abnormal EKG, Murmur  History:        Patient has no prior history of Echocardiogram examinations.                 Risk Factors:Hypertension and Dyslipidemia.  Sonographer:    Jyl Heinz Referring Phys: [id number] DAVID MANUEL [place]  1. Very limited echo due to poor visualization, overall nondiagnostic. From technician notes visualization limited by breast implants and inability to press on chest due to pain. . Left ventricular ejection fraction, by estimation, is unable to assess, poor visualization%. The left ventricle has unable to assess, poor visualization function. Left ventricular endocardial border not optimally defined to evaluate regional wall motion. The left ventricular internal cavity size was not well visualized. Left  ventricular diastolic function could not be evaluated.   2. Right ventricular systolic function was not well visualized. The right ventricular size is not well visualized.  3. The mitral valve was not well visualized. not well visualized mitral valve regurgitation. unable to assess mitral stenosis.  4. Tricuspid valve regurgitation unable to assess. unable to assess tricuspid stenosis.  5. The aortic valve was not well visualized. Aortic valve regurgitation not able to assess. not able to assess.  6. Pulmonic valve regurgitation unable to assess. FINDINGS  Left Ventricle: Very limited echo due to poor visualization, overall nondiagnostic. From technician notes visualization limited by breast implants and inability to press on chest due to pain. Left ventricular ejection fraction, by estimation, is unable to assess, poor visualization%. The left ventricle has unable to assess, poor visualization function. Left ventricular endocardial border not optimally defined to evaluate regional wall motion. The left ventricular internal cavity size was not well visualized. Suboptimal image quality limits for assessment of left ventricular hypertrophy. Left ventricular diastolic function could not be evaluated. Right Ventricle: The right ventricular size is not well visualized. Right vetricular wall thickness was not well visualized. Right ventricular systolic function was not well visualized. Left Atrium: Left atrial size was not well visualized. Right Atrium: Right atrial size was not well visualized. Pericardium: The pericardium was not well visualized. Mitral Valve: The mitral valve was not well visualized. Not well visualized mitral valve regurgitation. Unable to assess mitral valve stenosis. Tricuspid Valve: The tricuspid valve is not well visualized. Tricuspid valve regurgitation unable to assess. unable to assess tricuspid stenosis. Aortic Valve: The aortic valve was not well visualized. Aortic valve regurgitation not able to assess. Not able to assess. Pulmonic Valve: The  pulmonic valve was not well visualized. Pulmonic valve regurgitation  unable to assess. Aorta: The aortic root was not well visualized. IAS/Shunts: The interatrial septum was not well visualized. Carlyle Dolly MD Electronically signed by Carlyle Dolly MD Signature Date/Time: 03/23/2021/10:50:11 AM    Final         Scheduled Meds:  apixaban  5 mg Oral BID   azithromycin  500 mg Oral Daily   fluticasone  2 spray Each Nare Daily   guaiFENesin  600 mg Oral BID   insulin aspart  0-15 Units Subcutaneous TID WC   levothyroxine  125 mcg Oral Daily   loratadine  10 mg Oral Daily   mouth rinse  15 mL Mouth Rinse BID   pantoprazole  40 mg Oral Daily   Continuous Infusions:  cefTRIAXone (ROCEPHIN)  IV 2 g (03/24/21 1219)     LOS: 2 days    Time spent: 40 minutes    Berle Mull, MD [facility]   To contact the attending provider between 7A-7P or the covering provider during after hours 7P-7A, please log into the web site [email or web address] and access using universal [place] password for that web site. If you do not have the password, please call the hospital operator.  03/24/2021, 8:46 PM

## 2021-03-24 NOTE — Evaluation (Signed)
Occupational Therapy Evaluation Patient Details Name: Angela [name] Name MRN: [id number] DOB: 01/20/1933 Today's Date: 03/24/2021   History of Present Illness 88 y.o.female with medical history significant of osteoarthritis, breast cancer, aortic atherosclerosis, type II DM, GERD, hypertension, history of PE who is coming to the emergency department due to dyspnea associated with fever, night sweats, cough, congestion, fatigue, malaise, decreased appetite for the past 2 to 3 days.  Pt admitted 03/22/20 for sepsis due to pneumonia.   Clinical Impression   Pt admitted with the above diagnoses and presents with below problem list. Pt will benefit from continued acute OT to address the below listed deficits and maximize independence with basic ADLs prior to d/c to venue below. At baseline, pt lives alone, has daily assist of nurse aide for LB ADLs, meal prep and light housekeeping. Pt reports utilizing a BSC at times when she is home alone although endorses usually being able to walk to the bathroom with her walker. Pt limited this session by decreased activity tolerance, fatigue, generalized weakness and feeling poorly. She currently needs +1 mod A to come to EOB position. Poor sitting balance this session needing BUE support in static sitting. Would need +2 assist to safely attempt standing. Pt is determined to d/c home at this time. Discussed OT goals for safe return home. Of note, pt with sharply increased pain to touch on her RLE just below the knee and distal. Pt verbally reports the pain is the same level in both legs but clinical observation yielded RLE much worse. Notified RN after session d/t concern for possibly developing RLE DVT?       Recommendations for follow up therapy are one component of a multi-disciplinary discharge planning process, led by the attending physician.  Recommendations may be updated based on patient status, additional functional criteria and insurance authorization.    Follow Up Recommendations  Home health OT    Assistance Recommended at Discharge Frequent or constant Supervision/Assistance  Patient can return home with the following A lot of help with walking and/or transfers;A lot of help with bathing/dressing/bathroom;Assist for transportation    Functional Status Assessment  Patient has had a recent decline in their functional status and demonstrates the ability to make significant improvements in function in a reasonable and predictable amount of time.  Equipment Recommendations  Hospital bed    Recommendations for Other Services       Precautions / Restrictions Precautions Precautions: Fall Restrictions Weight Bearing Restrictions: No      Mobility Bed Mobility Overal bed mobility: Needs Assistance Bed Mobility: Supine to Sit;Sit to Supine     Supine to sit: Mod assist;HOB elevated Sit to supine: Mod assist;HOB elevated   General bed mobility comments: Assist for trunk elevation and to advance RLE> LLE. Extra time and effort, used rails and small pivoting movements    Transfers                   General transfer comment: Pt with poor sitting balance and activity tolerance today. Would need +2 to safely attempt standing      Balance Overall balance assessment: Needs assistance;History of Falls Sitting-balance support: Bilateral upper extremity supported;Feet supported Sitting balance-Leahy Scale: Poor Sitting balance - Comments: BUE support in static sitting this session                                   ADL either performed or assessed  with clinical judgement   ADL Overall ADL's : Needs assistance/impaired Eating/Feeding: Set up;Sitting   Grooming: Sitting;Moderate assistance   Upper Body Bathing: Sitting;Moderate assistance   Lower Body Bathing: Maximal assistance;Sitting/lateral leans;Bed level;Total assistance   Upper Body Dressing : Moderate assistance;Sitting   Lower Body Dressing:  Total assistance;Bed level;Sitting/lateral leans;Maximal assistance                 General ADL Comments: Pt needs supported sitting for UB ADLs in seated position. Minimal scooting along EOB with extra time and effort needed. +2 to safely attempt standing     Vision         Perception     Praxis      Pertinent Vitals/Pain Pain Assessment: Faces Faces Pain Scale: Hurts whole lot Pain Location: RLE just below knee and distal with light touch Pain Descriptors / Indicators: Grimacing;Moaning;Guarding Pain Intervention(s): Monitored during session;Limited activity within patient's tolerance;Repositioned;Other (comment) (notified RN after session)     Hand Dominance     Extremity/Trunk Assessment Upper Extremity Assessment Upper Extremity Assessment: Generalized weakness   Lower Extremity Assessment Lower Extremity Assessment: Defer to PT evaluation   Cervical / Trunk Assessment Cervical / Trunk Assessment: Other exceptions (lordotic? leans left in the bed, keeps L shoulder and back arched EOB)   Communication Communication Communication: No difficulties   Cognition Arousal/Alertness: Awake/alert Behavior During Therapy: WFL for tasks assessed/performed Overall Cognitive Status: No family/caregiver present to determine baseline cognitive functioning                                 General Comments: pt reports she "feels funny in my head." Pt endorses an episode yesterday evening of panic at not knowing where she was "they had to tell me I was in the hospital." Pt also with pain report not consistent with observation. Pt reporting both     General Comments       Exercises     Shoulder Instructions      Home Living Family/patient expects to be discharged to:: Private residence Living Arrangements: Alone Available Help at Discharge: Family;Home health Type of Home: House Home Access: Other (comment) (chair lift in lieu of stairs)     Home  Layout: Two level;Bed/bath upstairs;Other (Comment) (has chair lift)               Home Equipment: Rolling Walker (2 wheels);Rollator (4 wheels);BSC/3in1   Additional Comments: has stair chair lift      Prior Functioning/Environment Prior Level of Function : Needs assist             Mobility Comments: typically ambulatory with RW, has a HHRN, Nadine ADLs Comments: assist with LB ADLs and meal prep, uses BSC on "bad" days. typically can walk to the bathroom per her report        OT Problem List: Decreased strength;Decreased activity tolerance;Impaired balance (sitting and/or standing);Decreased cognition;Decreased knowledge of use of DME or AE;Decreased knowledge of precautions;Obesity;Pain      OT Treatment/Interventions: Self-care/ADL training;Therapeutic exercise;Energy conservation;DME and/or AE instruction;Therapeutic activities;Patient/family education;Balance training    OT Goals(Current goals can be found in the care plan section) Acute Rehab OT Goals Patient Stated Goal: home OT Goal Formulation: With patient Time For Goal Achievement: 04/07/21 Potential to Achieve Goals: Good  OT Frequency: Min 2X/week    Co-evaluation              AM-PAC OT "6 Clicks" Daily Activity  Outcome Measure Help from another person eating meals?: A Little Help from another person taking care of personal grooming?: A Lot Help from another person toileting, which includes using toliet, bedpan, or urinal?: Total Help from another person bathing (including washing, rinsing, drying)?: Total Help from another person to put on and taking off regular upper body clothing?: A Lot Help from another person to put on and taking off regular lower body clothing?: Total 6 Click Score: 10   End of Session Nurse Communication: Other (comment);Mobility status (RLE pain to touch)  Activity Tolerance: Patient limited by fatigue;Patient limited by pain Patient left: in bed;with call  bell/phone within reach;with bed alarm set  OT Visit Diagnosis: Unsteadiness on feet (R26.81);Muscle weakness (generalized) (M62.81);History of falling (Z91.81);Pain;Other symptoms and signs involving cognitive function Pain - Right/Left: Right Pain - part of body: Leg;Ankle and joints of foot                Time: 3361-2244 OT Time Calculation (min): 30 min Charges:  OT General Charges $OT Visit: 1 Visit OT Evaluation $OT Eval Moderate Complexity: 1 Mod OT Treatments $Self Care/Home Management : 8-22 mins  Tyrone Schimke, OT Acute Rehabilitation Services Office: [phone]   Hortencia Pilar 03/24/2021, 10:52 AM

## 2021-03-24 NOTE — Progress Notes (Signed)
PHARMACIST - PHYSICIAN COMMUNICATION DR:   Posey Pronto CONCERNING: Antibiotic IV to Oral Route Change Policy  RECOMMENDATION: This patient is receiving Azithromycin by the intravenous route.  Based on criteria approved by the Pharmacy and Therapeutics Committee, the antibiotic(s) is/are being converted to the equivalent oral dose form(s).   DESCRIPTION: These criteria include: Patient being treated for a respiratory tract infection, urinary tract infection, cellulitis or clostridium difficile associated diarrhea if on metronidazole The patient is not neutropenic and does not exhibit a GI malabsorption state The patient is eating (either orally or via tube) and/or has been taking other orally administered medications for a least 24 hours The patient is improving clinically and has a Tmax < 100.5  Minda Ditto PharmD [facility] [phone] 03/24/2021, 12:39 PM   If you have questions about this conversion, please contact the Pharmacy Department  []   [phone] )  [facility] []   [phone] )  [facility] []   [phone] )  [facility] []   [phone] )  [facility] [x]   [phone] )  [facility]

## 2021-03-24 NOTE — Progress Notes (Signed)
OT Cancellation Note  Patient Details Name: Angela [name] MRN: [id number] DOB: 09/11/1932   Cancelled Treatment:    Reason Eval/Treat Not Completed: Other (comment). Pt eating breakfast, requests OT come back in a bit.   Tyrone Schimke, OT Acute Rehabilitation Services Office: [phone]  03/24/2021, 9:15 AM

## 2021-03-25 ENCOUNTER — Inpatient Hospital Stay (HOSPITAL_COMMUNITY): Payer: Medicare Other

## 2021-03-25 ENCOUNTER — Other Ambulatory Visit: Payer: Self-pay | Admitting: Endocrinology

## 2021-03-25 DIAGNOSIS — E785 Hyperlipidemia, unspecified: Secondary | ICD-10-CM | POA: Diagnosis not present

## 2021-03-25 DIAGNOSIS — E039 Hypothyroidism, unspecified: Secondary | ICD-10-CM

## 2021-03-25 DIAGNOSIS — K219 Gastro-esophageal reflux disease without esophagitis: Secondary | ICD-10-CM | POA: Diagnosis not present

## 2021-03-25 DIAGNOSIS — I517 Cardiomegaly: Secondary | ICD-10-CM | POA: Diagnosis not present

## 2021-03-25 DIAGNOSIS — R609 Edema, unspecified: Secondary | ICD-10-CM

## 2021-03-25 DIAGNOSIS — K5900 Constipation, unspecified: Secondary | ICD-10-CM

## 2021-03-25 DIAGNOSIS — J189 Pneumonia, unspecified organism: Secondary | ICD-10-CM | POA: Diagnosis not present

## 2021-03-25 LAB — BASIC METABOLIC PANEL
Anion gap: 8 (ref 5–15)
BUN: 7 mg/dL — ABNORMAL LOW (ref 8–23)
CO2: 28 mmol/L (ref 22–32)
Calcium: 8.5 mg/dL — ABNORMAL LOW (ref 8.9–10.3)
Chloride: 100 mmol/L (ref 98–111)
Creatinine, Ser: 0.5 mg/dL (ref 0.44–1.00)
GFR, Estimated: >60 mL/min (ref >60)
Glucose, Bld: 96 mg/dL (ref 70–99)
Potassium: 3.5 mmol/L (ref 3.5–5.1)
Sodium: 136 mmol/L (ref 135–145)

## 2021-03-25 LAB — CBC WITH DIFFERENTIAL/PLATELET
Abs Immature Granulocytes: 0.17 10*3/uL — ABNORMAL HIGH (ref 0.00–0.07)
Basophils Absolute: 0.1 10*3/uL (ref 0.0–0.1)
Basophils Relative: 1 %
Eosinophils Absolute: 0.2 10*3/uL (ref 0.0–0.5)
Eosinophils Relative: 2 %
HCT: 39.1 % (ref 36.0–46.0)
Hemoglobin: 12.9 g/dL (ref 12.0–15.0)
Immature Granulocytes: 2 %
Lymphocytes Relative: 17 %
Lymphs Abs: 1.9 10*3/uL (ref 0.7–4.0)
MCH: 31.3 pg (ref 26.0–34.0)
MCHC: 33 g/dL (ref 30.0–36.0)
MCV: 94.9 fL (ref 80.0–100.0)
Monocytes Absolute: 1 10*3/uL (ref 0.1–1.0)
Monocytes Relative: 9 %
Neutro Abs: 7.7 10*3/uL (ref 1.7–7.7)
Neutrophils Relative %: 69 %
Platelets: 218 10*3/uL (ref 150–400)
RBC: 4.12 MIL/uL (ref 3.87–5.11)
RDW: 13.2 % (ref 11.5–15.5)
WBC: 11 10*3/uL — ABNORMAL HIGH (ref 4.0–10.5)
nRBC: 0 % (ref 0.0–0.2)

## 2021-03-25 LAB — GLUCOSE, CAPILLARY
Glucose-Capillary: 106 mg/dL — ABNORMAL HIGH (ref 70–99)
Glucose-Capillary: 115 mg/dL — ABNORMAL HIGH (ref 70–99)
Glucose-Capillary: 115 mg/dL — ABNORMAL HIGH (ref 70–99)
Glucose-Capillary: 147 mg/dL — ABNORMAL HIGH (ref 70–99)

## 2021-03-25 LAB — MAGNESIUM: Magnesium: 1.6 mg/dL — ABNORMAL LOW (ref 1.7–2.4)

## 2021-03-25 MED ORDER — POTASSIUM CHLORIDE CRYS ER 20 MEQ PO TBCR
40.0000 meq | EXTENDED_RELEASE_TABLET | Freq: Once | ORAL | Status: AC
  Administered 2021-03-25: 40 meq via ORAL
  Filled 2021-03-25: qty 2

## 2021-03-25 MED ORDER — CEFDINIR 300 MG PO CAPS
300.0000 mg | ORAL_CAPSULE | Freq: Two times a day (BID) | ORAL | Status: AC
  Administered 2021-03-25 – 2021-03-28 (×8): 300 mg via ORAL
  Filled 2021-03-25 (×9): qty 1

## 2021-03-25 MED ORDER — SENNOSIDES-DOCUSATE SODIUM 8.6-50 MG PO TABS
1.0000 | ORAL_TABLET | Freq: Two times a day (BID) | ORAL | Status: DC
  Administered 2021-03-25 – 2021-03-29 (×8): 1 via ORAL
  Filled 2021-03-25 (×9): qty 1

## 2021-03-25 MED ORDER — HYDROCHLOROTHIAZIDE 12.5 MG PO TABS
12.5000 mg | ORAL_TABLET | Freq: Every day | ORAL | Status: DC
  Administered 2021-03-25 – 2021-03-29 (×5): 12.5 mg via ORAL
  Filled 2021-03-25 (×5): qty 1

## 2021-03-25 MED ORDER — IPRATROPIUM-ALBUTEROL 0.5-2.5 (3) MG/3ML IN SOLN
3.0000 mL | Freq: Three times a day (TID) | RESPIRATORY_TRACT | Status: DC
  Administered 2021-03-25 – 2021-03-26 (×2): 3 mL via RESPIRATORY_TRACT
  Filled 2021-03-25 (×3): qty 3

## 2021-03-25 MED ORDER — POLYETHYLENE GLYCOL 3350 17 G PO PACK
17.0000 g | PACK | Freq: Two times a day (BID) | ORAL | Status: DC
  Administered 2021-03-25 – 2021-03-29 (×8): 17 g via ORAL
  Filled 2021-03-25 (×9): qty 1

## 2021-03-25 MED ORDER — SORBITOL 70 % SOLN
30.0000 mL | Status: AC
  Administered 2021-03-25 (×2): 30 mL via ORAL
  Filled 2021-03-25: qty 30

## 2021-03-25 MED ORDER — FUROSEMIDE 10 MG/ML IJ SOLN
20.0000 mg | Freq: Two times a day (BID) | INTRAMUSCULAR | Status: AC
  Administered 2021-03-26 (×2): 20 mg via INTRAVENOUS
  Filled 2021-03-25 (×2): qty 2

## 2021-03-25 MED ORDER — BENAZEPRIL HCL 20 MG PO TABS
20.0000 mg | ORAL_TABLET | Freq: Every day | ORAL | Status: DC
  Administered 2021-03-25 – 2021-03-29 (×5): 20 mg via ORAL
  Filled 2021-03-25 (×5): qty 1

## 2021-03-25 MED ORDER — FUROSEMIDE 10 MG/ML IJ SOLN
20.0000 mg | Freq: Two times a day (BID) | INTRAMUSCULAR | Status: DC
  Administered 2021-03-25: 20 mg via INTRAVENOUS
  Filled 2021-03-25: qty 2

## 2021-03-25 MED ORDER — BENAZEPRIL-HYDROCHLOROTHIAZIDE 20-12.5 MG PO TABS: 1.0000 | ORAL_TABLET | Freq: Every day | ORAL | Status: DC

## 2021-03-25 MED ORDER — MAGNESIUM SULFATE 4 GM/100ML IV SOLN
4.0000 g | Freq: Once | INTRAVENOUS | Status: AC
  Administered 2021-03-25: 4 g via INTRAVENOUS
  Filled 2021-03-25: qty 100

## 2021-03-25 NOTE — Progress Notes (Signed)
PROGRESS NOTE    Angela [name]  FIE:[id number] DOB: 1932/04/12 DOA: 03/22/2021 PCP: [name], MD    Chief Complaint  Patient presents with   Shortness of Breath   Nasal Congestion   Cough    Brief Narrative:  Patient is a pleasant 88 year old female history of osteoarthritis, breast cancer, aortic atherosclerosis, type 2 diabetes, GERD, hypertension, history of PE presented to the ED with dyspnea associated fever, night sweats, cough, congestion, fatigue, malaise, decreased appetite x2 to 3 days.  Patient noted to have 2 episodes of emesis.  Patient with a productive cough.  Patient also with complaints of some pleuritic chest pain.  On presentation patient noted to have a temp of 102.2, borderline blood pressure, sats of 92% on room air, tachycardic, tachypneic.  Chest x-ray done on admission concerning for left lower lobe infiltrate with small pleural effusion.  Patient admitted, pancultured, placed empirically on IV antibiotics for CAP.   Assessment & Plan:   Principal Problem:   Community acquired pneumonia Active Problems:   Hypertension   Dyslipidemia   Prediabetes   GERD (gastroesophageal reflux disease)   Hypothyroidism (acquired)   Pulmonary embolism without acute cor pulmonale (HCC)   Cardiomegaly   Hypokalemia   Hyperbilirubinemia   Hypocalcemia   Hypomagnesemia  #1 sepsis secondary to CAP, POA -Patient presented with criteria meeting sepsis with fever, tachycardia, tachypnea, borderline blood pressure, chest x-ray consistent with a left lower lobe pneumonia. -Patient improving slowly clinically. -Sputum gram stain and culture pending. -Urine Legionella antigen, urine pneumococcus antigen pending. -Blood cultures pending with no growth to date x3 days. -COVID-19 PCR negative, influenza A and B PCR negative -IV azithromycin has been transition to oral azithromycin.   -Transition from IV Rocephin to oral Omnicef to complete course of antibiotic  treatment.   -Continue Mucinex, Flonase, Claritin.   -Nebs as needed.   2.  Hypokalemia/hypomagnesemia -Potassium at 3.5, magnesium at 1.6. -Magnesium sulfate 4 g IV x1, K. Dur 40 mEq p.o. x1. -Repeat labs in the morning.  3.  Hypertension -Blood pressure improved. -Resume home regimen benazepril HCT. -Continue to hold Imdur. -Lasix 20 mg IV every 12 hours x3 doses.  4.  Dyslipidemia -Not on medical therapy prior to admission. -Outpatient follow-up.  5.  GERD -PPI.  6.  Hypothyroidism -Synthroid.   -Outpatient follow-up.   7.  PE without acute cor pulmonale -Continue Eliquis.   8.  Hyperbilirubinemia -Improved.  9.  Cardiomegaly -2D echo EF unable to assess due to poor visualization, very limited echo due to poor visualization overall nondiagnostic.   10.  Pseudohypocalcemia -Corrected calcium at 9.04.  11.  Lower extremity edema -Lower extremity Dopplers negative for DVT. -Lasix 20 mg IV every 12 hours x3-4 doses.  12.  Constipation -MiraLAX twice daily Senokot-S twice daily Sorbitol p.o. x1.  DVT prophylaxis: Eliquis Code Status: Full Family Communication: Updated patient and caretaker at bedside Disposition:   Status is: Inpatient  Remains inpatient appropriate because: Severity of illness       Consultants:  None  Procedures:  Chest x-ray 03/22/2021 Lower extremity Dopplers 03/25/2021 Right upper extremity Doppler 03/25/2021 2D echo 03/23/2021  Antimicrobials:  IV azithromycin 03/23/2021>>>> oral azithromycin 03/24/2021>>>> 03/28/2021 IV Rocephin 03/23/2021>>>> 03/25/2021 IV Levaquin 03/22/2021 x 1 dose. Omnicef 03/25/2021>>> 03/29/2021   Subjective: Laying in bed.  Overall feeling better.  Complain of constipation.  Complains of swelling in her legs and asking for her home regimen Lasix.  Per RN patient noted to be short of  breath on minimal exertion and desatted into the 80s.  No chest pain.  No abdominal pain.  Tolerating current diet.  Caretaker at  bedside.   Objective: Vitals:   03/25/21 0400 03/25/21 0849 03/25/21 1013 03/25/21 1313  BP: (!) 151/70  (!) 168/71 (!) 147/42  Pulse: 75   91  Resp: 13   (!) 25  Temp: 98.9 F (37.2 C)   98.5 F (36.9 C)  TempSrc: Oral   Oral  SpO2: 93% 96%  96%  Weight:      Height:        Intake/Output Summary (Last 24 hours) at 03/25/2021 1315 Last data filed at 03/25/2021 0600 Gross per 24 hour  Intake 240 ml  Output 850 ml  Net -610 ml    Filed Weights   03/22/21 1143 03/22/21 1637  Weight: 90.7 kg 113.2 kg    Examination:  General exam: NAD. Respiratory system: Some coarse bibasilar breath sounds.  No wheezing.  Fair air movement.  Speaking in full sentences. Cardiovascular system: Regular rate rhythm no murmurs rubs or gallops.  No JVD.  No pitting lower extremity edema.   Gastrointestinal system: Abdomen is soft, nontender, nondistended, positive bowel sounds.  No rebound.  No guarding.  Central nervous system: Alert and oriented. No focal neurological deficits. Extremities: Symmetric 5 x 5 power. Skin: No rashes, lesions or ulcers Psychiatry: Judgement and insight appear fair. Mood & affect appropriate.     Data Reviewed: I have personally reviewed following labs and imaging studies  CBC: Recent Labs  Lab 03/22/21 1210 03/23/21 0414 03/24/21 0349 03/25/21 0355  WBC 26.9* 20.1* 14.7* 11.0*  NEUTROABS 22.3* 15.7* 10.6* 7.7  HGB 16.5* 12.9 12.8 12.9  HCT 48.0* 38.4 37.8 39.1  MCV 92.1 94.8 93.1 94.9  PLT 307 201 188 218     Basic Metabolic Panel: Recent Labs  Lab 03/22/21 1210 03/23/21 0412 03/23/21 0414 03/24/21 0349 03/25/21 0849  NA 132*  --  134* 134* 136  K 2.8*  --  4.1 4.0 3.5  CL 94*  --  97* 101 100  CO2 30  --  29 26 28   GLUCOSE 163*  --  104* 111* 96  BUN 8  --  15 13 7*  CREATININE 0.73  --  1.02* 0.63 0.50  CALCIUM 8.2*  --  8.0* 7.5* 8.5*  MG 1.1* 2.5*  --  1.6* 1.6*  PHOS 2.9  --   --   --   --      GFR: Estimated Creatinine  Clearance: 66.6 mL/min (by C-G formula based on SCr of 0.5 mg/dL).  Liver Function Tests: Recent Labs  Lab 03/22/21 1210 03/23/21 0414 03/24/21 0349  AST 26 19 17   ALT 20 13 12   ALKPHOS 115 87 77  BILITOT 2.3* 1.2 0.5  PROT 7.3 5.4* 5.2*  ALBUMIN 3.7 2.7* 2.4*     CBG: Recent Labs  Lab 03/24/21 1136 03/24/21 1641 03/24/21 2054 03/25/21 0730 03/25/21 1108  GLUCAP 120* 105* 111* 106* 115*      Recent Results (from the past 240 hour(s))  Resp Panel by RT-PCR (Flu A&B, Covid) Nasopharyngeal Swab     Status: None   Collection Time: 03/22/21 12:10 PM   Specimen: Nasopharyngeal Swab; Nasopharyngeal(NP) swabs in vial transport medium  Result Value Ref Range Status   SARS Coronavirus 2 by RT PCR NEGATIVE NEGATIVE Final    Comment: (NOTE) SARS-CoV-2 target nucleic acids are NOT DETECTED.  The SARS-CoV-2 RNA is generally detectable in  upper respiratory specimens during the acute phase of infection. The lowest concentration of SARS-CoV-2 viral copies this assay can detect is 138 copies/mL. A negative result does not preclude SARS-Cov-2 infection and should not be used as the sole basis for treatment or other patient management decisions. A negative result may occur with  improper specimen collection/handling, submission of specimen other than nasopharyngeal swab, presence of viral mutation(s) within the areas targeted by this assay, and inadequate number of viral copies(<138 copies/mL). A negative result must be combined with clinical observations, patient history, and epidemiological information. The expected result is Negative.  Fact Sheet for Patients:  EntrepreneurPulse.com.au  Fact Sheet for Healthcare Providers:  IncredibleEmployment.be  This test is no t yet approved or cleared by the [place] FDA and  has been authorized for detection and/or diagnosis of SARS-CoV-2 by FDA under an Emergency Use Authorization (EUA). This  EUA will remain  in effect (meaning this test can be used) for the duration of the COVID-19 declaration under Section 564(b)(1) of the Act, 21 U.S.C.section 360bbb-3(b)(1), unless the authorization is terminated  or revoked sooner.       Influenza A by PCR NEGATIVE NEGATIVE Final   Influenza B by PCR NEGATIVE NEGATIVE Final    Comment: (NOTE) The Xpert Xpress SARS-CoV-2/FLU/RSV plus assay is intended as an aid in the diagnosis of influenza from Nasopharyngeal swab specimens and should not be used as a sole basis for treatment. Nasal washings and aspirates are unacceptable for Xpert Xpress SARS-CoV-2/FLU/RSV testing.  Fact Sheet for Patients: EntrepreneurPulse.com.au  Fact Sheet for Healthcare Providers: IncredibleEmployment.be  This test is not yet approved or cleared by the [place] FDA and has been authorized for detection and/or diagnosis of SARS-CoV-2 by FDA under an Emergency Use Authorization (EUA). This EUA will remain in effect (meaning this test can be used) for the duration of the COVID-19 declaration under Section 564(b)(1) of the Act, 21 U.S.C. section 360bbb-3(b)(1), unless the authorization is terminated or revoked.  Performed at [facility], [place] [address] [place], [place] [address]   Blood Culture (routine x 2)     Status: None (Preliminary result)   Collection Time: 03/22/21 12:10 PM   Specimen: BLOOD  Result Value Ref Range Status   Specimen Description   Final    BLOOD BLOOD LEFT FOREARM Performed at [facility] [address], [place], [place] [address]    Special Requests   Final    BOTTLES DRAWN AEROBIC AND ANAEROBIC Blood Culture adequate volume Performed at [facility] [address], [place], [place] [address]    Culture   Final    NO GROWTH 3 DAYS Performed at [facility], [place] [address] [place], [place] [address]    Report Status  PENDING  Incomplete  Blood Culture (routine x 2)     Status: None (Preliminary result)   Collection Time: 03/22/21 12:15 PM   Specimen: BLOOD  Result Value Ref Range Status   Specimen Description   Final    BLOOD RIGHT ANTECUBITAL Performed at [facility] [address] [place], [place] [address]    Special Requests   Final    BOTTLES DRAWN AEROBIC AND ANAEROBIC Blood Culture adequate volume Performed at [facility] [address], [place], [place] [address]    Culture   Final    NO GROWTH 3 DAYS Performed at [facility], [place] [address], [place], [place] [address]  Report Status PENDING  Incomplete  Urine Culture     Status: None   Collection Time: 03/23/21  2:53 PM   Specimen: Urine, Catheterized  Result Value Ref Range Status   Specimen Description   Final    URINE, CATHETERIZED Performed at [facility] [address] [place], [place] [address]    Special Requests   Final    NONE Performed at [facility], [place] [address] [place], [place] [address]    Culture   Final    NO GROWTH Performed at [facility], [place] [address] [place], [place] [address]    Report Status 03/24/2021 FINAL  Final          Radiology Studies: VAS [place] LOWER EXTREMITY VENOUS (DVT)  Result Date: 03/25/2021  Lower Venous DVT Study Patient Name:  Angela [name]  Date of Exam:   03/25/2021 Medical Rec #: [id number]           Accession #:    [id number] Date of Birth: 1933-01-09           Patient Gender: F Patient Age:   63 years Exam Location:  [facility] - In[facility] Procedure:      VAS [place] LOWER EXTREMITY VENOUS (DVT) Referring Phys: PRANAV PATEL --------------------------------------------------------------------------------  Indications: Edema.  Limitations: Body habitus, poor ultrasound/tissue interface and patient unable to position properly for exam (immobility). Comparison Study: Previous exams on  5.28.2021 & 6.10.2017 were both negative for                   DVT. Performing Technologist: Rogelia Rohrer RVT, RDMS  Examination Guidelines: A complete evaluation includes B-mode imaging, spectral Doppler, color Doppler, and power Doppler as needed of all accessible portions of each vessel. Bilateral testing is considered an integral part of a complete examination. Limited examinations for reoccurring indications may be performed as noted. The reflux portion of the exam is performed with the patient in reverse Trendelenburg.  +---------+---------------+---------+-----------+----------+--------------+  RIGHT     Compressibility Phasicity Spontaneity Properties Thrombus Aging  +---------+---------------+---------+-----------+----------+--------------+  CFV       Full            Yes       Yes                                    +---------+---------------+---------+-----------+----------+--------------+  SFJ       Full                                                             +---------+---------------+---------+-----------+----------+--------------+  FV Prox   Full            Yes       Yes                                    +---------+---------------+---------+-----------+----------+--------------+  FV Mid    Full            Yes       Yes                                    +---------+---------------+---------+-----------+----------+--------------+  FV Distal                                                  Not visualized  +---------+---------------+---------+-----------+----------+--------------+  PFV       Full                                                             +---------+---------------+---------+-----------+----------+--------------+  POP       Full            Yes       Yes                                    +---------+---------------+---------+-----------+----------+--------------+  PTV                                                        Not visualized   +---------+---------------+---------+-----------+----------+--------------+  PERO      Full                                                             +---------+---------------+---------+-----------+----------+--------------+   Right Technical Findings: Not visualized segments include FV (distal) & PTV.  +---------+---------------+---------+-----------+----------+-------------------+  LEFT      Compressibility Phasicity Spontaneity Properties Thrombus Aging       +---------+---------------+---------+-----------+----------+-------------------+  CFV       Full            Yes       Yes                                         +---------+---------------+---------+-----------+----------+-------------------+  SFJ       Full                                                                  +---------+---------------+---------+-----------+----------+-------------------+  FV Prox   Full            Yes       Yes                                         +---------+---------------+---------+-----------+----------+-------------------+  FV Mid    Full            Yes       Yes                                         +---------+---------------+---------+-----------+----------+-------------------+  FV Distal Full            Yes       Yes                                         +---------+---------------+---------+-----------+----------+-------------------+  PFV       Full                                                                  +---------+---------------+---------+-----------+----------+-------------------+  POP       Full            Yes       Yes                                         +---------+---------------+---------+-----------+----------+-------------------+  PTV       Full                                                                  +---------+---------------+---------+-----------+----------+-------------------+  PERO                                                       Not well visualized   +---------+---------------+---------+-----------+----------+-------------------+     Summary: BILATERAL: - No evidence of deep vein thrombosis seen in the lower extremities, bilaterally (in areas imaged). - No evidence of superficial venous thrombosis in the lower extremities, bilaterally. -No evidence of popliteal cyst, bilaterally.   *See table(s) above for measurements and observations.    Preliminary    VAS [place] UPPER EXTREMITY VENOUS DUPLEX  Result Date: 03/25/2021 UPPER VENOUS STUDY  Patient Name:  MALAIA BUCHTA [name]  Date of Exam:   03/25/2021 Medical Rec #: [id number]           Accession #:    [id number] Date of Birth: [birth year]           Patient Gender: F Patient Age:   49 years Exam Location:  [facility] Procedure:      VAS [place] UPPER EXTREMITY VENOUS DUPLEX Referring Phys: Quillian Quince Tifini Reeder --------------------------------------------------------------------------------  Indications: RUE pain (from BP cuff) Comparison Study: No previous exams Performing Technologist: Jody Hill RVT, RDMS  Examination Guidelines: A complete evaluation includes B-mode imaging, spectral Doppler, color Doppler, and power Doppler as needed of all accessible portions of each vessel. Bilateral testing is considered an integral part of a complete examination. Limited examinations for reoccurring indications may be performed as noted.  Right Findings: +----------+------------+---------+-----------+----------+-------+  RIGHT      Compressible Phasicity Spontaneous Properties Summary  +----------+------------+---------+-----------+----------+-------+  IJV            Full        Yes  Yes                         +----------+------------+---------+-----------+----------+-------+  Subclavian     Full        Yes        Yes                         +----------+------------+---------+-----------+----------+-------+  Axillary       Full        Yes        Yes                          +----------+------------+---------+-----------+----------+-------+  Brachial       Full        Yes        Yes                         +----------+------------+---------+-----------+----------+-------+  Radial         Full                                               +----------+------------+---------+-----------+----------+-------+  Ulnar          Full                                               +----------+------------+---------+-----------+----------+-------+  Cephalic       Full                                               +----------+------------+---------+-----------+----------+-------+  Basilic        Full        Yes        Yes                         +----------+------------+---------+-----------+----------+-------+  Left Findings: +----------+------------+---------+-----------+----------+-------+  LEFT       Compressible Phasicity Spontaneous Properties Summary  +----------+------------+---------+-----------+----------+-------+  Subclavian     Full        Yes        Yes                         +----------+------------+---------+-----------+----------+-------+  Summary:  Right: No evidence of deep vein thrombosis in the upper extremity. No evidence of superficial vein thrombosis in the upper extremity.  Left: No evidence of thrombosis in the subclavian.  *See table(s) above for measurements and observations.    Preliminary         Scheduled Meds:  apixaban  5 mg Oral BID   azithromycin  500 mg Oral Daily   benazepril  20 mg Oral Daily   And   hydrochlorothiazide  12.5 mg Oral Daily   cefdinir  300 mg Oral Q12H   fluticasone  2 spray Each Nare Daily   furosemide  20 mg Intravenous BID   guaiFENesin  600 mg Oral BID   insulin aspart  0-15 Units Subcutaneous TID WC  ipratropium-albuterol  3 mL Nebulization TID   levothyroxine  125 mcg Oral Daily   loratadine  10 mg Oral Daily   mouth rinse  15 mL Mouth Rinse BID   pantoprazole  40 mg Oral Daily   polyethylene glycol  17 g Oral BID    senna-docusate  1 tablet Oral BID   sorbitol  30 mL Oral Q2H   Continuous Infusions:     LOS: 3 days    Time spent: 40 minutes    Irine Seal, MD [facility]   To contact the attending provider between 7A-7P or the covering provider during after hours 7P-7A, please log into the web site [email or web address] and access using universal [employer] password for that web site. If you do not have the password, please call the hospital operator.  03/25/2021, 1:15 PM

## 2021-03-25 NOTE — Progress Notes (Signed)
PHYSICAL THERAPY  SATURATION QUALIFICATIONS: (This note is used to comply with regulatory documentation for home oxygen)  Patient Saturations on Room Air at Rest =96%  Patient Saturations on Room Air while Ambulating only 2 feet due to effort =89% with HR 92    Please briefly explain why patient needs home oxygen:  Pt did NOT require supplemental oxygen   Rica Koyanagi  PTA Acute  Rehabilitation Services Pager      [phone] Office      [phone]

## 2021-03-25 NOTE — Progress Notes (Signed)
Physical Therapy Treatment Patient Details Name: Angela [name] MRN: [id number] DOB: March 18, 1933 Today's Date: 03/25/2021   History of Present Illness 88 y.o.female with medical history significant of osteoarthritis, breast cancer, aortic atherosclerosis, type II DM, GERD, hypertension, history of PE who is coming to the emergency department due to dyspnea associated with fever, night sweats, cough, congestion, fatigue, malaise, decreased appetite for the past 2 to 3 days.  Pt admitted 03/22/20 for sepsis due to pneumonia.    PT Comments    General Comments: AxO x 2 very anxious and demanding.  Following all instructions but getting a head of herself requiring repeat instructions.  Pt also fearful, "I am not going to do anything that will cause me to fall". Justice Rocher (pt's private duty Aide) present during session and very helpful.   Assisted OOB to [facility] then to recliner required increased time. General bed mobility comments: Assist for trunk elevation and to advance RLE> LLE. Extra time and effort, used rails plus bed pad to complete scooting to EOB. General transfer comment: pt had difficulty self rising even from an elevated bed.  Pt uses rocking forward momentum to partially rise with trunk forward flex.  Pt was able to complete 1/4 pivot to [facility] with increased time and difficulty weightshifting due to "bad knees". General Gait Details: Very limited amb distance from [facility] to recliner a total of 2 feet due to fatigue,  "bad knees", body habitus and increased fear of falling/anxiety. Pt plans to return home with Nadine.    Recommendations for follow up therapy are one component of a multi-disciplinary discharge planning process, led by the attending physician.  Recommendations may be updated based on patient status, additional functional criteria and insurance authorization.  Follow Up Recommendations  Home health PT     Assistance Recommended at Discharge Frequent or constant Supervision/Assistance   Patient can return home with the following Two people to help with walking and/or transfers;A lot of help with bathing/dressing/bathroom;Assistance with cooking/housework;Direct supervision/assist for medications management;Assist for transportation;A lot of help with walking and/or transfers;Two people to help with bathing/dressing/bathroom;Direct supervision/assist for financial management;Help with stairs or ramp for entrance   Equipment Recommendations  None recommended by PT    Recommendations for Other Services       Precautions / Restrictions       Mobility  Bed Mobility Overal bed mobility: Needs Assistance Bed Mobility: Supine to Sit     Supine to sit: Mod assist;HOB elevated;Max assist     General bed mobility comments: Assist for trunk elevation and to advance RLE> LLE. Extra time and effort, used rails plus bed pad to complete scooting to EOB.    Transfers Overall transfer level: Needs assistance Equipment used: Rolling walker (2 wheels) Transfers: Sit to/from Stand;Bed to chair/wheelchair/BSC Sit to Stand: Min assist;Mod assist Stand pivot transfers: Mod assist         General transfer comment: pt had difficulty self rising even from an elevated bed.  Pt uses rocking forward momentum to partially rise with trunk forward flex.  Pt was able to complete 1/4 pivot to [facility] with increased time and difficulty weightshifting due to "bad knee".    Ambulation/Gait Ambulation/Gait assistance: Mod assist;Min assist Gait Distance (Feet): 2 Feet Assistive device: Rolling walker (2 wheels) Gait Pattern/deviations: Step-to pattern;Decreased step length - right;Decreased step length - left;Shuffle;Trunk flexed Gait velocity: decreased     General Gait Details: Very limited amb distance from [facility] to recliner due to weakness, "bad knees" and increased fear  of falling/anxiety.   Stairs             Wheelchair Mobility    Modified Rankin (Stroke Patients Only)        Balance                                            Cognition Arousal/Alertness: Awake/alert Behavior During Therapy: WFL for tasks assessed/performed                                   General Comments: AxO x 2 very anxious and demanding.  Following all instructions but getting a head of herself requiring repeat instructions.  Pt also fearful, "I am not going to do anything that will cause me to fall".        Exercises      General Comments        Pertinent Vitals/Pain      Home Living                          Prior Function            PT Goals (current goals can now be found in the care plan section) Progress towards PT goals: Progressing toward goals    Frequency    Min 3X/week      PT Plan Current plan remains appropriate    Co-evaluation              AM-PAC PT "6 Clicks" Mobility   Outcome Measure  Help needed turning from your back to your side while in a flat bed without using bedrails?: A Lot Help needed moving from lying on your back to sitting on the side of a flat bed without using bedrails?: A Lot Help needed moving to and from a bed to a chair (including a wheelchair)?: A Lot Help needed standing up from a chair using your arms (e.g., wheelchair or bedside chair)?: A Lot Help needed to walk in hospital room?: A Lot Help needed climbing 3-5 steps with a railing? : Total 6 Click Score: 11    End of Session Equipment Utilized During Treatment: Gait belt Activity Tolerance: Patient tolerated treatment well Patient left: with call bell/phone within reach;in chair;with chair alarm set;with family/visitor present Nurse Communication: Mobility status PT Visit Diagnosis: Other abnormalities of gait and mobility (R26.89)     Time: 0626-9485 PT Time Calculation (min) (ACUTE ONLY): 26 min  Charges:  $Gait Training: 8-22 mins $Therapeutic Activity: 8-22 mins                     Rica Koyanagi   PTA Acute  Rehabilitation Services Pager      [phone] Office      [phone]

## 2021-03-26 DIAGNOSIS — K219 Gastro-esophageal reflux disease without esophagitis: Secondary | ICD-10-CM | POA: Diagnosis not present

## 2021-03-26 DIAGNOSIS — J189 Pneumonia, unspecified organism: Secondary | ICD-10-CM | POA: Diagnosis not present

## 2021-03-26 DIAGNOSIS — I517 Cardiomegaly: Secondary | ICD-10-CM | POA: Diagnosis not present

## 2021-03-26 DIAGNOSIS — E785 Hyperlipidemia, unspecified: Secondary | ICD-10-CM | POA: Diagnosis not present

## 2021-03-26 LAB — CBC WITH DIFFERENTIAL/PLATELET
Abs Immature Granulocytes: 0.2 10*3/uL — ABNORMAL HIGH (ref 0.00–0.07)
Basophils Absolute: 0.1 10*3/uL (ref 0.0–0.1)
Basophils Relative: 1 %
Eosinophils Absolute: 0.4 10*3/uL (ref 0.0–0.5)
Eosinophils Relative: 4 %
HCT: 38.7 % (ref 36.0–46.0)
Hemoglobin: 13.4 g/dL (ref 12.0–15.0)
Immature Granulocytes: 2 %
Lymphocytes Relative: 19 %
Lymphs Abs: 2 10*3/uL (ref 0.7–4.0)
MCH: 32.1 pg (ref 26.0–34.0)
MCHC: 34.6 g/dL (ref 30.0–36.0)
MCV: 92.6 fL (ref 80.0–100.0)
Monocytes Absolute: 1.1 10*3/uL — ABNORMAL HIGH (ref 0.1–1.0)
Monocytes Relative: 10 %
Neutro Abs: 7 10*3/uL (ref 1.7–7.7)
Neutrophils Relative %: 64 %
Platelets: 242 10*3/uL (ref 150–400)
RBC: 4.18 MIL/uL (ref 3.87–5.11)
RDW: 13.2 % (ref 11.5–15.5)
WBC: 10.8 10*3/uL — ABNORMAL HIGH (ref 4.0–10.5)
nRBC: 0 % (ref 0.0–0.2)

## 2021-03-26 LAB — BASIC METABOLIC PANEL
Anion gap: 9 (ref 5–15)
BUN: 7 mg/dL — ABNORMAL LOW (ref 8–23)
CO2: 30 mmol/L (ref 22–32)
Calcium: 8.4 mg/dL — ABNORMAL LOW (ref 8.9–10.3)
Chloride: 98 mmol/L (ref 98–111)
Creatinine, Ser: 0.49 mg/dL (ref 0.44–1.00)
GFR, Estimated: >60 mL/min (ref >60)
Glucose, Bld: 115 mg/dL — ABNORMAL HIGH (ref 70–99)
Potassium: 3.9 mmol/L (ref 3.5–5.1)
Sodium: 137 mmol/L (ref 135–145)

## 2021-03-26 LAB — GLUCOSE, CAPILLARY
Glucose-Capillary: 126 mg/dL — ABNORMAL HIGH (ref 70–99)
Glucose-Capillary: 119 mg/dL — ABNORMAL HIGH (ref 70–99)
Glucose-Capillary: 104 mg/dL — ABNORMAL HIGH (ref 70–99)
Glucose-Capillary: 121 mg/dL — ABNORMAL HIGH (ref 70–99)
Glucose-Capillary: 136 mg/dL — ABNORMAL HIGH (ref 70–99)

## 2021-03-26 LAB — MAGNESIUM: Magnesium: 1.9 mg/dL (ref 1.7–2.4)

## 2021-03-26 MED ORDER — ISOSORBIDE MONONITRATE ER 30 MG PO TB24
30.0000 mg | ORAL_TABLET | Freq: Every day | ORAL | Status: DC
  Administered 2021-03-26 – 2021-03-29 (×4): 30 mg via ORAL
  Filled 2021-03-26 (×4): qty 1

## 2021-03-26 MED ORDER — POTASSIUM CHLORIDE ER 10 MEQ PO TBCR
10.0000 meq | EXTENDED_RELEASE_TABLET | Freq: Every day | ORAL | Status: DC
  Administered 2021-03-26 – 2021-03-29 (×4): 10 meq via ORAL
  Filled 2021-03-26 (×8): qty 1

## 2021-03-26 NOTE — Progress Notes (Signed)
PROGRESS NOTE    Angela [name]  BLT:[id number] DOB: Jan 18, 1933 DOA: 03/22/2021 PCP: [name], MD    Chief Complaint  Patient presents with   Shortness of Breath   Nasal Congestion   Cough    Brief Narrative:  Patient is a pleasant 88 year old female history of osteoarthritis, breast cancer, aortic atherosclerosis, type 2 diabetes, GERD, hypertension, history of PE presented to the ED with dyspnea associated fever, night sweats, cough, congestion, fatigue, malaise, decreased appetite x2 to 3 days.  Patient noted to have 2 episodes of emesis.  Patient with a productive cough.  Patient also with complaints of some pleuritic chest pain.  On presentation patient noted to have a temp of 102.2, borderline blood pressure, sats of 92% on room air, tachycardic, tachypneic.  Chest x-ray done on admission concerning for left lower lobe infiltrate with small pleural effusion.  Patient admitted, pancultured, placed empirically on IV antibiotics for CAP.   Assessment & Plan:   Principal Problem:   Community acquired pneumonia Active Problems:   Hypertension   Dyslipidemia   Prediabetes   GERD (gastroesophageal reflux disease)   Hypothyroidism (acquired)   Pulmonary embolism without acute cor pulmonale (HCC)   Cardiomegaly   Hypokalemia   Hyperbilirubinemia   Hypocalcemia   Hypomagnesemia  #1 sepsis secondary to CAP, POA -Patient presented with criteria meeting sepsis with fever, tachycardia, tachypnea, borderline blood pressure, chest x-ray consistent with a left lower lobe pneumonia. -Patient improving clinically.  -Sputum gram stain and culture pending. -Urine Legionella antigen negative, urine pneumococcus antigen pending. -Blood cultures pending with no growth to date x3 days. -COVID-19 PCR negative, influenza A and B PCR negative -IV azithromycin has been transition to oral azithromycin.   -IV Rocephin has been transition to oral Omnicef.   -Continue Mucinex, Flonase,  Claritin.   -Nebs as needed.    2.  Hypokalemia/hypomagnesemia -Potassium at 3.9, magnesium at 1.9.   -Repeat labs in the morning.  3.  Hypertension -Blood pressure improved. -Continue home regimen of benazepril HCT.   -Resume home regimen Imdur.   -Continue Lasix 20 mg IV every 12 hours x2 more doses and discontinue this evening.  4.  Dyslipidemia -Not on medical therapy prior to admission. -Outpatient follow-up.  5.  GERD -PPI.  6.  Hypothyroidism -Synthroid.   -Outpatient follow-up.   7.  PE without acute cor pulmonale -Eliquis.    8.  Hyperbilirubinemia -Improved.  9.  Cardiomegaly -2D echo EF unable to assess due to poor visualization, very limited echo due to poor visualization overall nondiagnostic.   10.  Pseudohypocalcemia -Corrected calcium at 9.04.  11.  Lower extremity edema -Lower extremity Dopplers negative for DVT.   -Continue Lasix 20 mg IV every 12 hours for 2 more doses through today and discontinue.   12.  Constipation -Patient stated had a bowel movement on regimen of MiraLAX twice daily, Senokot-S twice daily.   -Outpatient follow-up.   DVT prophylaxis: Eliquis Code Status: Full Family Communication: Updated patient and caretaker at bedside Disposition:   Status is: Inpatient  Remains inpatient appropriate because: Severity of illness       Consultants:  None  Procedures:  Chest x-ray 03/22/2021 Lower extremity Dopplers 03/25/2021 Right upper extremity Doppler 03/25/2021 2D echo 03/23/2021  Antimicrobials:  IV azithromycin 03/23/2021>>>> oral azithromycin 03/24/2021>>>> 03/28/2021 IV Rocephin 03/23/2021>>>> 03/25/2021 IV Levaquin 03/22/2021 x 1 dose. Omnicef 03/25/2021>>> 03/29/2021   Subjective: Laying in bed.  Feels shortness of breath is improving since yesterday.  No chest  pain.  No abdominal pain.  Feels she needs some extra help at home.  Not interested in SNF placement.    Objective: Vitals:   03/25/21 2229 03/26/21 0500 03/26/21 0854  03/26/21 1305  BP: (!) 156/74 (!) 165/96  140/62  Pulse: 81 85  91  Resp: 16 20  17   Temp: (!) 97.5 F (36.4 C) 97.7 F (36.5 C)  98.5 F (36.9 C)  TempSrc: Oral Oral  Oral  SpO2: 93% 97% 95% 97%  Weight:      Height:        Intake/Output Summary (Last 24 hours) at 03/26/2021 1322 Last data filed at 03/26/2021 0945 Gross per 24 hour  Intake 120 ml  Output 2400 ml  Net -2280 ml    Filed Weights   03/22/21 1143 03/22/21 1637  Weight: 90.7 kg 113.2 kg    Examination:  General exam: NAD. Respiratory system: Decreasing bibasilar coarse breath sounds.  Fair air movement.  No wheezing.  Speaking in full sentences.   Cardiovascular system: RRR no murmurs rubs or gallops.  No JVD.  No pitting lower extremity edema.   Gastrointestinal system: Abdomen is soft, nontender, nondistended, positive bowel sounds.  No rebound.  No guarding.  Central nervous system: Alert and oriented. No focal neurological deficits. Extremities: Symmetric 5 x 5 power. Skin: No rashes, lesions or ulcers Psychiatry: Judgement and insight appear fair. Mood & affect appropriate.     Data Reviewed: I have personally reviewed following labs and imaging studies  CBC: Recent Labs  Lab 03/22/21 1210 03/23/21 0414 03/24/21 0349 03/25/21 0355 03/26/21 0404  WBC 26.9* 20.1* 14.7* 11.0* 10.8*  NEUTROABS 22.3* 15.7* 10.6* 7.7 7.0  HGB 16.5* 12.9 12.8 12.9 13.4  HCT 48.0* 38.4 37.8 39.1 38.7  MCV 92.1 94.8 93.1 94.9 92.6  PLT 307 201 188 218 242     Basic Metabolic Panel: Recent Labs  Lab 03/22/21 1210 03/23/21 0412 03/23/21 0414 03/24/21 0349 03/25/21 0849 03/26/21 0404  NA 132*  --  134* 134* 136 137  K 2.8*  --  4.1 4.0 3.5 3.9  CL 94*  --  97* 101 100 98  CO2 30  --  29 26 28 30   GLUCOSE 163*  --  104* 111* 96 115*  BUN 8  --  15 13 7* 7*  CREATININE 0.73  --  1.02* 0.63 0.50 0.49  CALCIUM 8.2*  --  8.0* 7.5* 8.5* 8.4*  MG 1.1* 2.5*  --  1.6* 1.6* 1.9  PHOS 2.9  --   --   --   --   --       GFR: Estimated Creatinine Clearance: 66.6 mL/min (by C-G formula based on SCr of 0.49 mg/dL).  Liver Function Tests: Recent Labs  Lab 03/22/21 1210 03/23/21 0414 03/24/21 0349  AST 26 19 17   ALT 20 13 12   ALKPHOS 115 87 77  BILITOT 2.3* 1.2 0.5  PROT 7.3 5.4* 5.2*  ALBUMIN 3.7 2.7* 2.4*     CBG: Recent Labs  Lab 03/25/21 1108 03/25/21 1809 03/25/21 2226 03/26/21 0749 03/26/21 1136  GLUCAP 115* 115* 147* 126* 119*      Recent Results (from the past 240 hour(s))  Resp Panel by RT-PCR (Flu A&B, Covid) Nasopharyngeal Swab     Status: None   Collection Time: 03/22/21 12:10 PM   Specimen: Nasopharyngeal Swab; Nasopharyngeal(NP) swabs in vial transport medium  Result Value Ref Range Status   SARS Coronavirus 2 by RT PCR NEGATIVE NEGATIVE Final  Comment: (NOTE) SARS-CoV-2 target nucleic acids are NOT DETECTED.  The SARS-CoV-2 RNA is generally detectable in upper respiratory specimens during the acute phase of infection. The lowest concentration of SARS-CoV-2 viral copies this assay can detect is 138 copies/mL. A negative result does not preclude SARS-Cov-2 infection and should not be used as the sole basis for treatment or other patient management decisions. A negative result may occur with  improper specimen collection/handling, submission of specimen other than nasopharyngeal swab, presence of viral mutation(s) within the areas targeted by this assay, and inadequate number of viral copies(<138 copies/mL). A negative result must be combined with clinical observations, patient history, and epidemiological information. The expected result is Negative.  Fact Sheet for Patients:  EntrepreneurPulse.com.au  Fact Sheet for Healthcare Providers:  IncredibleEmployment.be  This test is no t yet approved or cleared by the [place] FDA and  has been authorized for detection and/or diagnosis of SARS-CoV-2 by FDA under an  Emergency Use Authorization (EUA). This EUA will remain  in effect (meaning this test can be used) for the duration of the COVID-19 declaration under Section 564(b)(1) of the Act, 21 U.S.C.section 360bbb-3(b)(1), unless the authorization is terminated  or revoked sooner.       Influenza A by PCR NEGATIVE NEGATIVE Final   Influenza B by PCR NEGATIVE NEGATIVE Final    Comment: (NOTE) The Xpert Xpress SARS-CoV-2/FLU/RSV plus assay is intended as an aid in the diagnosis of influenza from Nasopharyngeal swab specimens and should not be used as a sole basis for treatment. Nasal washings and aspirates are unacceptable for Xpert Xpress SARS-CoV-2/FLU/RSV testing.  Fact Sheet for Patients: EntrepreneurPulse.com.au  Fact Sheet for Healthcare Providers: IncredibleEmployment.be  This test is not yet approved or cleared by the [place] FDA and has been authorized for detection and/or diagnosis of SARS-CoV-2 by FDA under an Emergency Use Authorization (EUA). This EUA will remain in effect (meaning this test can be used) for the duration of the COVID-19 declaration under Section 564(b)(1) of the Act, 21 U.S.C. section 360bbb-3(b)(1), unless the authorization is terminated or revoked.  Performed at [facility], [place] [address], [place], [place] [address]   Blood Culture (routine x 2)     Status: None (Preliminary result)   Collection Time: 03/22/21 12:10 PM   Specimen: BLOOD  Result Value Ref Range Status   Specimen Description   Final    BLOOD BLOOD LEFT FOREARM Performed at [facility] [address] [place], [place] [address]    Special Requests   Final    BOTTLES DRAWN AEROBIC AND ANAEROBIC Blood Culture adequate volume Performed at [facility] [address], [place], [place] [address]    Culture   Final    NO GROWTH 4 DAYS Performed at [facility], [place] [address],  [place], [place] [address]    Report Status PENDING  Incomplete  Blood Culture (routine x 2)     Status: None (Preliminary result)   Collection Time: 03/22/21 12:15 PM   Specimen: BLOOD  Result Value Ref Range Status   Specimen Description   Final    BLOOD RIGHT ANTECUBITAL Performed at [place] [place] [address], [place], [place] [address]    Special Requests   Final    BOTTLES DRAWN AEROBIC AND ANAEROBIC Blood Culture adequate volume Performed at [facility] [address] [place], [place] [address]    Culture   Final    NO GROWTH  4 DAYS Performed at [facility], [place] [address], [place], [place] [address]    Report Status PENDING  Incomplete  Urine Culture     Status: None   Collection Time: 03/23/21  2:53 PM   Specimen: Urine, Catheterized  Result Value Ref Range Status   Specimen Description   Final    URINE, CATHETERIZED Performed at [facility] [address] [place], [place] [address]    Special Requests   Final    NONE Performed at [facility], [place] [address], [place], [place] [address]    Culture   Final    NO GROWTH Performed at [facility], [place] [address], [place], [place] [address]    Report Status 03/24/2021 FINAL  Final          Radiology Studies: VAS [place] LOWER EXTREMITY VENOUS (DVT)  Result Date: 03/25/2021  Lower Venous DVT Study Patient Name:  Angela [name]  Date of Exam:   03/25/2021 Medical Rec #: [id number]           Accession #:    [id number] Date of Birth: Sep 19, 1932           Patient Gender: F Patient Age:   14 years Exam Location:  [facility] Procedure:      VAS [place] LOWER EXTREMITY VENOUS (DVT) Referring Phys: PRANAV PATEL --------------------------------------------------------------------------------  Indications: Edema.  Limitations: Body habitus, poor ultrasound/tissue interface and patient unable to position properly for exam (immobility).  Comparison Study: Previous exams on 5.28.2021 & 6.10.2017 were both negative for                   DVT. Performing Technologist: Rogelia Rohrer RVT, RDMS  Examination Guidelines: A complete evaluation includes B-mode imaging, spectral Doppler, color Doppler, and power Doppler as needed of all accessible portions of each vessel. Bilateral testing is considered an integral part of a complete examination. Limited examinations for reoccurring indications may be performed as noted. The reflux portion of the exam is performed with the patient in reverse Trendelenburg.  +---------+---------------+---------+-----------+----------+--------------+  RIGHT     Compressibility Phasicity Spontaneity Properties Thrombus Aging  +---------+---------------+---------+-----------+----------+--------------+  CFV       Full            Yes       Yes                                    +---------+---------------+---------+-----------+----------+--------------+  SFJ       Full                                                             +---------+---------------+---------+-----------+----------+--------------+  FV Prox   Full            Yes       Yes                                    +---------+---------------+---------+-----------+----------+--------------+  FV Mid    Full            Yes       Yes                                    +---------+---------------+---------+-----------+----------+--------------+  FV Distal                                                  Not visualized  +---------+---------------+---------+-----------+----------+--------------+  PFV       Full                                                             +---------+---------------+---------+-----------+----------+--------------+  POP       Full            Yes       Yes                                    +---------+---------------+---------+-----------+----------+--------------+  PTV                                                        Not visualized   +---------+---------------+---------+-----------+----------+--------------+  PERO      Full                                                             +---------+---------------+---------+-----------+----------+--------------+   Right Technical Findings: Not visualized segments include FV (distal) & PTV.  +---------+---------------+---------+-----------+----------+-------------------+  LEFT      Compressibility Phasicity Spontaneity Properties Thrombus Aging       +---------+---------------+---------+-----------+----------+-------------------+  CFV       Full            Yes       Yes                                         +---------+---------------+---------+-----------+----------+-------------------+  SFJ       Full                                                                  +---------+---------------+---------+-----------+----------+-------------------+  FV Prox   Full            Yes       Yes                                         +---------+---------------+---------+-----------+----------+-------------------+  FV Mid    Full            Yes       Yes                                         +---------+---------------+---------+-----------+----------+-------------------+  FV Distal Full            Yes       Yes                                         +---------+---------------+---------+-----------+----------+-------------------+  PFV       Full                                                                  +---------+---------------+---------+-----------+----------+-------------------+  POP       Full            Yes       Yes                                         +---------+---------------+---------+-----------+----------+-------------------+  PTV       Full                                                                  +---------+---------------+---------+-----------+----------+-------------------+  PERO                                                       Not well visualized   +---------+---------------+---------+-----------+----------+-------------------+     Summary: BILATERAL: - No evidence of deep vein thrombosis seen in the lower extremities, bilaterally (in areas imaged). - No evidence of superficial venous thrombosis in the lower extremities, bilaterally. -No evidence of popliteal cyst, bilaterally.   *See table(s) above for measurements and observations. Electronically signed by Harold Barban MD on 03/25/2021 at 11:32:28 PM.    Final    VAS [place] UPPER EXTREMITY VENOUS DUPLEX  Result Date: 03/25/2021 UPPER VENOUS STUDY  Patient Name:  Angela [name]  Date of Exam:   03/25/2021 Medical Rec #: [id number]           Accession #:    [id number] Date of Birth: 1933-03-14           Patient Gender: F Patient Age:   60 years Exam Location:  [facility] Procedure:      VAS [place] UPPER EXTREMITY VENOUS DUPLEX Referring Phys: Quillian Quince Kyiah Canepa --------------------------------------------------------------------------------  Indications: RUE pain (from BP cuff) Comparison Study: No previous exams Performing Technologist: Jody Hill RVT, RDMS  Examination Guidelines: A complete evaluation includes B-mode imaging, spectral Doppler, color Doppler, and power Doppler as needed of all accessible portions of each vessel. Bilateral testing is considered an integral part of a complete examination. Limited examinations for reoccurring indications may be performed as noted.  Right Findings: +----------+------------+---------+-----------+----------+-------+  RIGHT      Compressible Phasicity Spontaneous Properties Summary  +----------+------------+---------+-----------+----------+-------+  IJV  Full        Yes        Yes                         +----------+------------+---------+-----------+----------+-------+  Subclavian     Full        Yes        Yes                         +----------+------------+---------+-----------+----------+-------+  Axillary       Full        Yes        Yes                          +----------+------------+---------+-----------+----------+-------+  Brachial       Full        Yes        Yes                         +----------+------------+---------+-----------+----------+-------+  Radial         Full                                               +----------+------------+---------+-----------+----------+-------+  Ulnar          Full                                               +----------+------------+---------+-----------+----------+-------+  Cephalic       Full                                               +----------+------------+---------+-----------+----------+-------+  Basilic        Full        Yes        Yes                         +----------+------------+---------+-----------+----------+-------+  Left Findings: +----------+------------+---------+-----------+----------+-------+  LEFT       Compressible Phasicity Spontaneous Properties Summary  +----------+------------+---------+-----------+----------+-------+  Subclavian     Full        Yes        Yes                         +----------+------------+---------+-----------+----------+-------+  Summary:  Right: No evidence of deep vein thrombosis in the upper extremity. No evidence of superficial vein thrombosis in the upper extremity.  Left: No evidence of thrombosis in the subclavian.  *See table(s) above for measurements and observations.  Diagnosing physician: Harold Barban MD Electronically signed by Harold Barban MD on 03/25/2021 at 11:31:41 PM.    Final         Scheduled Meds:  apixaban  5 mg Oral BID   azithromycin  500 mg Oral Daily   benazepril  20 mg Oral Daily   And   hydrochlorothiazide  12.5 mg Oral Daily   cefdinir  300 mg Oral Q12H   fluticasone  2 spray Each Nare Daily   furosemide  20 mg Intravenous BID   guaiFENesin  600 mg Oral BID   insulin aspart  0-15 Units Subcutaneous TID WC   ipratropium-albuterol  3 mL Nebulization TID   isosorbide mononitrate  30 mg Oral Daily   levothyroxine  125  mcg Oral Daily   loratadine  10 mg Oral Daily   mouth rinse  15 mL Mouth Rinse BID   pantoprazole  40 mg Oral Daily   polyethylene glycol  17 g Oral BID   potassium chloride  10 mEq Oral Daily   senna-docusate  1 tablet Oral BID   Continuous Infusions:     LOS: 4 days    Time spent: 40 minutes    Irine Seal, MD [facility]   To contact the attending provider between 7A-7P or the covering provider during after hours 7P-7A, please log into the web site [email or web address] and access using universal [place] password for that web site. If you do not have the password, please call the hospital operator.  03/26/2021, 1:22 PM

## 2021-03-26 NOTE — TOC Progression Note (Signed)
Transition of Care [facility]) - Progression Note    Patient Details  Name: Angela [name] MRN: [id number] Date of Birth: 1932/09/19  Transition of [facility] CM/SW Contact  Purcell Mouton, RN Phone Number: 03/26/2021, 1:24 PM  Clinical Narrative:     Spoke with pt concerning extra home care at discharge. Pt has personal care givers now. Explained to pt that she will need to hire someone for more hours. [facility] is setup for [place]. Pt will need [facility] to transport her home at discharge.    Expected Discharge Plan: [place] Barriers to Discharge: No Barriers Identified  Expected Discharge Plan and Services Expected Discharge Plan: [place] arrangements for the past 2 months: Single Family Home                           HH Arranged: PT [place]: [facility] [place] ([facility]) Date HH Agency Contacted: 03/24/21 Time [place]: [phone] Representative spoke with at [place]: [place] ([place]) Interventions    Readmission Risk Interventions No flowsheet data found.

## 2021-03-26 NOTE — Progress Notes (Signed)
OT Cancellation Note  Patient Details Name: Angela [name] MRN: [id number] DOB: 1933/01/14   Cancelled Treatment:    Reason Eval/Treat Not Completed: Pain limiting ability to participate-Pt reporting knee pain as 150/10 despite having had pain meds ~1 hour prior to OT arrival. Pt refused to work with OT until knee pain improved.  RN was notified of this and pt request for more pain meds. Will continue OT efforts.  Julien Girt 03/26/2021, 2:05 PM

## 2021-03-26 NOTE — Progress Notes (Signed)
SATURATION QUALIFICATIONS: (This note is used to comply with regulatory documentation for home oxygen)  Patient Saturations on Room Air at Rest = 100%  Patient Saturations on Room Air while Ambulating = 95%  Please briefly explain why patient needs home oxygen:   Patient O2 remained above 95% while ambulating.

## 2021-03-27 ENCOUNTER — Other Ambulatory Visit: Payer: Medicare Other

## 2021-03-27 DIAGNOSIS — E785 Hyperlipidemia, unspecified: Secondary | ICD-10-CM | POA: Diagnosis not present

## 2021-03-27 DIAGNOSIS — J189 Pneumonia, unspecified organism: Secondary | ICD-10-CM | POA: Diagnosis not present

## 2021-03-27 DIAGNOSIS — K219 Gastro-esophageal reflux disease without esophagitis: Secondary | ICD-10-CM | POA: Diagnosis not present

## 2021-03-27 DIAGNOSIS — I517 Cardiomegaly: Secondary | ICD-10-CM | POA: Diagnosis not present

## 2021-03-27 LAB — BASIC METABOLIC PANEL
Anion gap: 14 (ref 5–15)
BUN: 9 mg/dL (ref 8–23)
CO2: 31 mmol/L (ref 22–32)
Calcium: 8.7 mg/dL — ABNORMAL LOW (ref 8.9–10.3)
Chloride: 91 mmol/L — ABNORMAL LOW (ref 98–111)
Creatinine, Ser: 0.58 mg/dL (ref 0.44–1.00)
GFR, Estimated: >60 mL/min (ref >60)
Glucose, Bld: 119 mg/dL — ABNORMAL HIGH (ref 70–99)
Potassium: 3 mmol/L — ABNORMAL LOW (ref 3.5–5.1)
Sodium: 136 mmol/L (ref 135–145)

## 2021-03-27 LAB — CULTURE, BLOOD (ROUTINE X 2)
Culture: NO GROWTH
Culture: NO GROWTH
Special Requests: ADEQUATE
Special Requests: ADEQUATE

## 2021-03-27 LAB — GLUCOSE, CAPILLARY
Glucose-Capillary: 105 mg/dL — ABNORMAL HIGH (ref 70–99)
Glucose-Capillary: 143 mg/dL — ABNORMAL HIGH (ref 70–99)
Glucose-Capillary: 84 mg/dL (ref 70–99)
Glucose-Capillary: 109 mg/dL — ABNORMAL HIGH (ref 70–99)

## 2021-03-27 LAB — CBC WITH DIFFERENTIAL/PLATELET
Abs Immature Granulocytes: 0.41 10*3/uL — ABNORMAL HIGH (ref 0.00–0.07)
Basophils Absolute: 0.1 10*3/uL (ref 0.0–0.1)
Basophils Relative: 1 %
Eosinophils Absolute: 0.4 10*3/uL (ref 0.0–0.5)
Eosinophils Relative: 3 %
HCT: 40 % (ref 36.0–46.0)
Hemoglobin: 13.5 g/dL (ref 12.0–15.0)
Immature Granulocytes: 3 %
Lymphocytes Relative: 19 %
Lymphs Abs: 2.8 10*3/uL (ref 0.7–4.0)
MCH: 31 pg (ref 26.0–34.0)
MCHC: 33.8 g/dL (ref 30.0–36.0)
MCV: 92 fL (ref 80.0–100.0)
Monocytes Absolute: 1.6 10*3/uL — ABNORMAL HIGH (ref 0.1–1.0)
Monocytes Relative: 10 %
Neutro Abs: 9.8 10*3/uL — ABNORMAL HIGH (ref 1.7–7.7)
Neutrophils Relative %: 64 %
Platelets: 266 10*3/uL (ref 150–400)
RBC: 4.35 MIL/uL (ref 3.87–5.11)
RDW: 13.1 % (ref 11.5–15.5)
WBC: 15 10*3/uL — ABNORMAL HIGH (ref 4.0–10.5)
nRBC: 0 % (ref 0.0–0.2)

## 2021-03-27 LAB — MAGNESIUM: Magnesium: 1.3 mg/dL — ABNORMAL LOW (ref 1.7–2.4)

## 2021-03-27 MED ORDER — ONDANSETRON HCL 4 MG/2ML IJ SOLN
4.0000 mg | Freq: Once | INTRAMUSCULAR | Status: AC
  Administered 2021-03-27: 4 mg via INTRAVENOUS
  Filled 2021-03-27: qty 2

## 2021-03-27 MED ORDER — POTASSIUM CHLORIDE CRYS ER 20 MEQ PO TBCR
40.0000 meq | EXTENDED_RELEASE_TABLET | ORAL | Status: AC
  Administered 2021-03-27 (×2): 40 meq via ORAL
  Filled 2021-03-27 (×2): qty 2

## 2021-03-27 MED ORDER — MAGNESIUM SULFATE 4 GM/100ML IV SOLN
4.0000 g | Freq: Once | INTRAVENOUS | Status: AC
  Administered 2021-03-27: 4 g via INTRAVENOUS
  Filled 2021-03-27: qty 100

## 2021-03-27 NOTE — Progress Notes (Signed)
PROGRESS NOTE    Angela [name]  FIE:[id number] DOB: 1932-05-31 DOA: 03/22/2021 PCP: [name], MD    Chief Complaint  Patient presents with   Shortness of Breath   Nasal Congestion   Cough    Brief Narrative:  Patient is a pleasant 88 year old female history of osteoarthritis, breast cancer, aortic atherosclerosis, type 2 diabetes, GERD, hypertension, history of PE presented to the ED with dyspnea associated fever, night sweats, cough, congestion, fatigue, malaise, decreased appetite x2 to 3 days.  Patient noted to have 2 episodes of emesis.  Patient with a productive cough.  Patient also with complaints of some pleuritic chest pain.  On presentation patient noted to have a temp of 102.2, borderline blood pressure, sats of 92% on room air, tachycardic, tachypneic.  Chest x-ray done on admission concerning for left lower lobe infiltrate with small pleural effusion.  Patient admitted, pancultured, placed empirically on IV antibiotics for CAP.   Assessment & Plan:   Principal Problem:   Community acquired pneumonia Active Problems:   Hypertension   Dyslipidemia   Prediabetes   GERD (gastroesophageal reflux disease)   Hypothyroidism (acquired)   Pulmonary embolism without acute cor pulmonale (HCC)   Cardiomegaly   Hypokalemia   Hyperbilirubinemia   Hypocalcemia   Hypomagnesemia  #1 sepsis secondary to CAP, POA -Patient presented with criteria meeting sepsis with fever, tachycardia, tachypnea, borderline blood pressure, chest x-ray consistent with a left lower lobe pneumonia. -Patient improving clinically.  -Sputum gram stain and culture pending. -Urine Legionella antigen negative, urine pneumococcus antigen pending. -Blood cultures negative to date. -COVID-19 PCR negative, influenza A and B PCR negative -IV azithromycin has been transition to oral azithromycin.   -IV Rocephin has been transition to oral Omnicef.   -Continue Mucinex, Flonase, Claritin.   -Nebs as  needed.    2.  Hypokalemia/hypomagnesemia -Potassium at 3 0, magnesium at 1.3 -Magnesium sulfate 4 g IV x1. -K-Dur 40 mEq p.o. every 4 hours x2 doses.  -Repeat labs in the morning.  3.  Hypertension -Blood pressure improved. -Continue home regimen of benazepril HCT, Imdur. -Status post IV Lasix x4 doses. -Follow.  4.  Dyslipidemia -Not on medical therapy prior to admission. -Outpatient follow-up.  5.  GERD -Continue PPI.  6.  Hypothyroidism -Synthroid.   -Outpatient follow-up.   7.  PE without acute cor pulmonale -Continue Eliquis.    8.  Hyperbilirubinemia -Improved.  9.  Cardiomegaly -2D echo EF unable to assess due to poor visualization, very limited echo due to poor visualization overall nondiagnostic.   10.  Pseudohypocalcemia -Corrected calcium at 9.04.  11.  Lower extremity edema -Lower extremity Dopplers negative for DVT.   -Status post IV Lasix which were completed last night.   -Follow.  12.  Constipation -Patient stated had a bowel movement on regimen of MiraLAX twice daily, Senokot-S twice daily.   -Outpatient follow-up.   DVT prophylaxis: Eliquis Code Status: Full Family Communication: Updated patient and caretaker at bedside Disposition:   Status is: Inpatient  Remains inpatient appropriate because: Severity of illness       Consultants:  None  Procedures:  Chest x-ray 03/22/2021 Lower extremity Dopplers 03/25/2021 Right upper extremity Doppler 03/25/2021 2D echo 03/23/2021  Antimicrobials:  IV azithromycin 03/23/2021>>>> oral azithromycin 03/24/2021>>>> 03/28/2021 IV Rocephin 03/23/2021>>>> 03/25/2021 IV Levaquin 03/22/2021 x 1 dose. Omnicef 03/25/2021>>> 03/29/2021   Subjective: Laying in bed.  Caregiver at bedside.  Some complaints of nausea.  Shortness of breath improved.  Feeling somewhat congested.  No  chest pain.  No abdominal pain.  Tolerating current diet.   Objective: Vitals:   03/26/21 0854 03/26/21 1305 03/26/21 2058 03/27/21 0434   BP:  140/62 (!) 150/63 138/74  Pulse:  91 100 97  Resp:  17 18 18   Temp:  98.5 F (36.9 C) 98.7 F (37.1 C) 98.9 F (37.2 C)  TempSrc:  Oral    SpO2: 95% 97% 96% 96%  Weight:      Height:        Intake/Output Summary (Last 24 hours) at 03/27/2021 1230 Last data filed at 03/27/2021 0440 Gross per 24 hour  Intake 240 ml  Output 1251 ml  Net -1011 ml    Filed Weights   03/22/21 1143 03/22/21 1637  Weight: 90.7 kg 113.2 kg    Examination:  General exam: NAD. Respiratory system: Some decreased breath sounds in the bases otherwise clear.  No wheezes, no crackles, no rhonchi.  Normal respiratory effort.  Speaking in full sentences.   Cardiovascular system: Regular rate and rhythm no murmurs rubs or gallops.  No JVD.  No lower extremity edema. Gastrointestinal system: Abdomen is soft, nontender, nondistended, positive bowel sounds.  No rebound.  No guarding.  Central nervous system: Alert and oriented. No focal neurological deficits. Extremities: Symmetric 5 x 5 power. Skin: No rashes, lesions or ulcers Psychiatry: Judgement and insight appear fair. Mood & affect appropriate.     Data Reviewed: I have personally reviewed following labs and imaging studies  CBC: Recent Labs  Lab 03/23/21 0414 03/24/21 0349 03/25/21 0355 03/26/21 0404 03/27/21 0407  WBC 20.1* 14.7* 11.0* 10.8* 15.0*  NEUTROABS 15.7* 10.6* 7.7 7.0 9.8*  HGB 12.9 12.8 12.9 13.4 13.5  HCT 38.4 37.8 39.1 38.7 40.0  MCV 94.8 93.1 94.9 92.6 92.0  PLT 201 188 218 242 266     Basic Metabolic Panel: Recent Labs  Lab 03/22/21 1210 03/23/21 0412 03/23/21 0414 03/24/21 0349 03/25/21 0849 03/26/21 0404 03/27/21 0407  NA 132*  --  134* 134* 136 137 136  K 2.8*  --  4.1 4.0 3.5 3.9 3.0*  CL 94*  --  97* 101 100 98 91*  CO2 30  --  29 26 28 30 31   GLUCOSE 163*  --  104* 111* 96 115* 119*  BUN 8  --  15 13 7* 7* 9  CREATININE 0.73  --  1.02* 0.63 0.50 0.49 0.58  CALCIUM 8.2*  --  8.0* 7.5* 8.5* 8.4* 8.7*   MG 1.1* 2.5*  --  1.6* 1.6* 1.9 1.3*  PHOS 2.9  --   --   --   --   --   --      GFR: Estimated Creatinine Clearance: 66.6 mL/min (by C-G formula based on SCr of 0.58 mg/dL).  Liver Function Tests: Recent Labs  Lab 03/22/21 1210 03/23/21 0414 03/24/21 0349  AST 26 19 17   ALT 20 13 12   ALKPHOS 115 87 77  BILITOT 2.3* 1.2 0.5  PROT 7.3 5.4* 5.2*  ALBUMIN 3.7 2.7* 2.4*     CBG: Recent Labs  Lab 03/26/21 1637 03/26/21 1643 03/26/21 2055 03/27/21 0756 03/27/21 1114  GLUCAP 121* 104* 136* 105* 143*      Recent Results (from the past 240 hour(s))  Resp Panel by RT-PCR (Flu A&B, Covid) Nasopharyngeal Swab     Status: None   Collection Time: 03/22/21 12:10 PM   Specimen: Nasopharyngeal Swab; Nasopharyngeal(NP) swabs in vial transport medium  Result Value Ref Range Status  SARS Coronavirus 2 by RT PCR NEGATIVE NEGATIVE Final    Comment: (NOTE) SARS-CoV-2 target nucleic acids are NOT DETECTED.  The SARS-CoV-2 RNA is generally detectable in upper respiratory specimens during the acute phase of infection. The lowest concentration of SARS-CoV-2 viral copies this assay can detect is 138 copies/mL. A negative result does not preclude SARS-Cov-2 infection and should not be used as the sole basis for treatment or other patient management decisions. A negative result may occur with  improper specimen collection/handling, submission of specimen other than nasopharyngeal swab, presence of viral mutation(s) within the areas targeted by this assay, and inadequate number of viral copies(<138 copies/mL). A negative result must be combined with clinical observations, patient history, and epidemiological information. The expected result is Negative.  Fact Sheet for Patients:  EntrepreneurPulse.com.au  Fact Sheet for Healthcare Providers:  IncredibleEmployment.be  This test is no t yet approved or cleared by the [place] FDA and  has  been authorized for detection and/or diagnosis of SARS-CoV-2 by FDA under an Emergency Use Authorization (EUA). This EUA will remain  in effect (meaning this test can be used) for the duration of the COVID-19 declaration under Section 564(b)(1) of the Act, 21 U.S.C.section 360bbb-3(b)(1), unless the authorization is terminated  or revoked sooner.       Influenza A by PCR NEGATIVE NEGATIVE Final   Influenza B by PCR NEGATIVE NEGATIVE Final    Comment: (NOTE) The Xpert Xpress SARS-CoV-2/FLU/RSV plus assay is intended as an aid in the diagnosis of influenza from Nasopharyngeal swab specimens and should not be used as a sole basis for treatment. Nasal washings and aspirates are unacceptable for Xpert Xpress SARS-CoV-2/FLU/RSV testing.  Fact Sheet for Patients: EntrepreneurPulse.com.au  Fact Sheet for Healthcare Providers: IncredibleEmployment.be  This test is not yet approved or cleared by the [place] FDA and has been authorized for detection and/or diagnosis of SARS-CoV-2 by FDA under an Emergency Use Authorization (EUA). This EUA will remain in effect (meaning this test can be used) for the duration of the COVID-19 declaration under Section 564(b)(1) of the Act, 21 U.S.C. section 360bbb-3(b)(1), unless the authorization is terminated or revoked.  Performed at [facility], [place] [address] [place], [place] [address]   Blood Culture (routine x 2)     Status: None   Collection Time: 03/22/21 12:10 PM   Specimen: BLOOD  Result Value Ref Range Status   Specimen Description   Final    BLOOD BLOOD LEFT FOREARM Performed at [facility] [address] [place], [place] [address]    Special Requests   Final    BOTTLES DRAWN AEROBIC AND ANAEROBIC Blood Culture adequate volume Performed at [facility] [address], [place], [place] [address]    Culture   Final    NO GROWTH 5  DAYS Performed at [facility], [place] [address] [place], [place] [address]    Report Status 03/27/2021 FINAL  Final  Blood Culture (routine x 2)     Status: None   Collection Time: 03/22/21 12:15 PM   Specimen: BLOOD  Result Value Ref Range Status   Specimen Description   Final    BLOOD RIGHT ANTECUBITAL Performed at [facility] [address], [place], [place] [address]    Special Requests   Final    BOTTLES DRAWN AEROBIC AND ANAEROBIC Blood Culture adequate volume Performed at [facility] [address], [place], [place] [address]  Culture   Final    NO GROWTH 5 DAYS Performed at [facility], [place] [address], [place], [place] [address]    Report Status 03/27/2021 FINAL  Final  Urine Culture     Status: None   Collection Time: 03/23/21  2:53 PM   Specimen: Urine, Catheterized  Result Value Ref Range Status   Specimen Description   Final    URINE, CATHETERIZED Performed at [facility] [address] [place], [place] [address]    Special Requests   Final    NONE Performed at [facility], [place] [address] [place], [place] [address]    Culture   Final    NO GROWTH Performed at [facility], [place] [address] [place], [place] [address]    Report Status 03/24/2021 FINAL  Final          Radiology Studies: No results found.      Scheduled Meds:  apixaban  5 mg Oral BID   azithromycin  500 mg Oral Daily   benazepril  20 mg Oral Daily   And   hydrochlorothiazide  12.5 mg Oral Daily   cefdinir  300 mg Oral Q12H   fluticasone  2 spray Each Nare Daily   guaiFENesin  600 mg Oral BID   insulin aspart  0-15 Units Subcutaneous TID WC   isosorbide mononitrate  30 mg Oral Daily   levothyroxine  125 mcg Oral Daily   loratadine  10 mg Oral Daily   mouth rinse  15 mL Mouth Rinse BID   pantoprazole  40 mg Oral Daily   polyethylene glycol  17 g Oral BID   potassium  chloride  10 mEq Oral Daily   potassium chloride  40 mEq Oral Q4H   senna-docusate  1 tablet Oral BID   Continuous Infusions:     LOS: 5 days    Time spent: 40 minutes    Irine Seal, MD [facility]   To contact the attending provider between 7A-7P or the covering provider during after hours 7P-7A, please log into the web site [email or web address] and access using universal [place] password for that web site. If you do not have the password, please call the hospital operator.  03/27/2021, 12:30 PM

## 2021-03-27 NOTE — Progress Notes (Signed)
PT Cancellation Note  Patient Details Name: Angela [name] MRN: [id number] DOB: [birth year]   Cancelled Treatment:     pt in bed resting this afternoon after taking pain meds. Will continue to follow.  Her plan is to return home with her personal [name].   Rica Koyanagi  PTA Acute  Rehabilitation Services Pager      [phone] Office      [phone]

## 2021-03-27 NOTE — Progress Notes (Signed)
OT Cancellation Note  Patient Details Name: Angela [name] MRN: [id number] DOB: Aug 13, 1932   Cancelled Treatment:    Reason Eval/Treat Not Completed: Medical issues which prohibited therapy: Pt reports nausea and refusing OT this afternoon.  Pt reports that her MD was made aware and pt did receive medication but stated "it's not working yet."  Will continue efforts.   Julien Girt 03/27/2021, 2:02 PM

## 2021-03-28 DIAGNOSIS — E785 Hyperlipidemia, unspecified: Secondary | ICD-10-CM | POA: Diagnosis not present

## 2021-03-28 DIAGNOSIS — I517 Cardiomegaly: Secondary | ICD-10-CM | POA: Diagnosis not present

## 2021-03-28 DIAGNOSIS — K219 Gastro-esophageal reflux disease without esophagitis: Secondary | ICD-10-CM | POA: Diagnosis not present

## 2021-03-28 DIAGNOSIS — J189 Pneumonia, unspecified organism: Secondary | ICD-10-CM | POA: Diagnosis not present

## 2021-03-28 DIAGNOSIS — G894 Chronic pain syndrome: Secondary | ICD-10-CM | POA: Diagnosis not present

## 2021-03-28 LAB — CBC WITH DIFFERENTIAL/PLATELET
Abs Immature Granulocytes: 0.64 10*3/uL — ABNORMAL HIGH (ref 0.00–0.07)
Basophils Absolute: 0.1 10*3/uL (ref 0.0–0.1)
Basophils Relative: 1 %
Eosinophils Absolute: 0.5 10*3/uL (ref 0.0–0.5)
Eosinophils Relative: 3 %
HCT: 42 % (ref 36.0–46.0)
Hemoglobin: 13.9 g/dL (ref 12.0–15.0)
Immature Granulocytes: 5 %
Lymphocytes Relative: 18 %
Lymphs Abs: 2.6 10*3/uL (ref 0.7–4.0)
MCH: 31.5 pg (ref 26.0–34.0)
MCHC: 33.1 g/dL (ref 30.0–36.0)
MCV: 95.2 fL (ref 80.0–100.0)
Monocytes Absolute: 1.4 10*3/uL — ABNORMAL HIGH (ref 0.1–1.0)
Monocytes Relative: 10 %
Neutro Abs: 9 10*3/uL — ABNORMAL HIGH (ref 1.7–7.7)
Neutrophils Relative %: 63 %
Platelets: 273 10*3/uL (ref 150–400)
RBC: 4.41 MIL/uL (ref 3.87–5.11)
RDW: 13.4 % (ref 11.5–15.5)
WBC: 14.3 10*3/uL — ABNORMAL HIGH (ref 4.0–10.5)
nRBC: 0 % (ref 0.0–0.2)

## 2021-03-28 LAB — BASIC METABOLIC PANEL
Anion gap: 9 (ref 5–15)
BUN: 13 mg/dL (ref 8–23)
CO2: 32 mmol/L (ref 22–32)
Calcium: 8.6 mg/dL — ABNORMAL LOW (ref 8.9–10.3)
Chloride: 91 mmol/L — ABNORMAL LOW (ref 98–111)
Creatinine, Ser: 0.87 mg/dL (ref 0.44–1.00)
GFR, Estimated: >60 mL/min (ref >60)
Glucose, Bld: 97 mg/dL (ref 70–99)
Potassium: 3.6 mmol/L (ref 3.5–5.1)
Sodium: 132 mmol/L — ABNORMAL LOW (ref 135–145)

## 2021-03-28 LAB — GLUCOSE, CAPILLARY
Glucose-Capillary: 104 mg/dL — ABNORMAL HIGH (ref 70–99)
Glucose-Capillary: 100 mg/dL — ABNORMAL HIGH (ref 70–99)
Glucose-Capillary: 98 mg/dL (ref 70–99)

## 2021-03-28 LAB — MAGNESIUM: Magnesium: 2.3 mg/dL (ref 1.7–2.4)

## 2021-03-28 MED ORDER — SENNOSIDES-DOCUSATE SODIUM 8.6-50 MG PO TABS: 1.0000 | ORAL_TABLET | Freq: Two times a day (BID) | ORAL | Status: DC

## 2021-03-28 MED ORDER — CEFDINIR 300 MG PO CAPS: 300.0000 mg | ORAL_CAPSULE | Freq: Two times a day (BID) | ORAL | 0 refills | Status: DC

## 2021-03-28 MED ORDER — ONDANSETRON HCL 4 MG PO TABS: 4.0000 mg | ORAL_TABLET | Freq: Four times a day (QID) | ORAL | 0 refills | Status: DC | PRN

## 2021-03-28 MED ORDER — FLUTICASONE PROPIONATE 50 MCG/ACT NA SUSP: 2.0000 | Freq: Every day | NASAL | 0 refills | Status: DC

## 2021-03-28 MED ORDER — VENTOLIN HFA 108 (90 BASE) MCG/ACT IN AERS: 2.0000 | INHALATION_SPRAY | Freq: Four times a day (QID) | RESPIRATORY_TRACT | Status: DC | PRN

## 2021-03-28 MED ORDER — LORATADINE 10 MG PO TABS: 10.0000 mg | ORAL_TABLET | Freq: Every day | ORAL | 0 refills | Status: DC

## 2021-03-28 NOTE — Discharge Summary (Addendum)
Physician Discharge Summary  Angela [name] Ward [name] HYW:[id number] DOB: [birth year] DOA: 03/22/2021  PCP: [name], MD  Admit date: 03/22/2021 Discharge date: 03/29/2021  Time spent: 55 minutes  Recommendations for Outpatient Follow-up:  Follow-up with [name], MD as scheduled.  On follow-up patient need a basic metabolic profile, magnesium level checked to follow-up on electrolytes and renal function.   Discharge Diagnoses:  Principal Problem:   Community acquired pneumonia Active Problems:   Hypertension   Dyslipidemia   Prediabetes   GERD (gastroesophageal reflux disease)   Hypothyroidism (acquired)   Pulmonary embolism without acute cor pulmonale (HCC)   Cardiomegaly   Hypokalemia   Hyperbilirubinemia   Hypocalcemia   Hypomagnesemia   Discharge Condition: Stable and improved  Diet recommendation: Regular  Filed Weights   03/22/21 1637 03/28/21 0422 03/29/21 0500  Weight: 113.2 kg 112.7 kg 112.8 kg    History of present illness:  HPI per Dr. Marinda Elk Ward Angela [name] is a 88 y.o. female with medical history significant of osteoarthritis, breast cancer, aortic atherosclerosis, type II DM, GERD, hypertension, history of PE who is coming to the emergency department due to dyspnea associated with fever, night sweats, cough, congestion, fatigue, malaise, decreased appetite for the past 2 to 3 days.  She had 2 episodes of emesis earlier today.  No travel history or sick contacts.  He has been coughing beige color sputum.  No hemoptysis.  He has pleuritic chest pain, but denied palpitations, dizziness, diaphoresis, PND, orthopnea but stated he occasionally gets lower extremity edema.  No diarrhea, constipation, melena or hematochezia.  Denied dysuria, flank pain, frequency or hematuria.  No polyuria, polydipsia, polyphagia or blurred vision.   ED Course: Initial vital signs were temperature 102.2 F, pulse 7019, respirations 26, BP 101/60 mmHg O2 sat 92 percent on room air.   The patient received 3000 mL of LR bolus, acetaminophen 650 mg PR and Levaquin 750 mg IVPB x1.  I added magnesium sulfate and KCl 40 mEq p.o. x1.   Lab work: CBC is her white count 6.9 with 82% neutrophils, hemoglobin 16.5 g/dL platelets 307.  PT was 16.8, PTT 31 and INR on 4 lactic acid was 2.0 mmol/twice.  CMP showed sodium 132, potassium 2.8 chloride 94 mmol/L.  Renal function was normal.  Glucose was 163, calcium 8.2 and total bilirubin 2.3 mg/dL.  The rest of the LFTs were normal.     Imaging: A portable 1 view chest radiograph atelectasis and/or consolidation of the left lower lobe with a small pleural effusion.  There was mild cardiomegaly.  Aortic atherosclerosis.  Please see images and full radiology report for further details.  Hospital Course:  1 sepsis secondary to CAP, POA -Patient presented with criteria meeting sepsis with fever, tachycardia, tachypnea, borderline blood pressure, chest x-ray consistent with a left lower lobe pneumonia. -Sputum gram stain and culture pending. -Urine Legionella antigen negative, urine pneumococcus antigen pending. -Blood cultures negative to date. -COVID-19 PCR negative, influenza A and B PCR negative -Patient initially placed on IV azithromycin and IV Rocephin.   -Patient also placed on Mucinex, Flonase, Claritin, nebs.   -Patient improved clinically was transitioned to oral azithromycin and oral Omnicef.   -Patient was discharged home on 1 more day of Omnicef to complete a 7-day course of antibiotic treatment.   -Outpatient follow-up with PCP.  2.  Hypokalemia/hypomagnesemia -Repleted during the hospitalization.   -Patient placed back on home regimen of KDur 10 mEq daily.   -Outpatient follow-up with  PCP.    3.  Hypertension -Blood pressure improved. -Patient placed back on home regimen of benazepril HCT, Imdur. -Status post IV Lasix x4 doses. -Outpatient follow-up with PCP.  4.  Dyslipidemia -Not on medical therapy prior to  admission. -Outpatient follow-up.  5.  GERD -Patient maintained on PPI.  6.  Hypothyroidism -Patient maintained on home regimen Synthroid.   -Outpatient follow-up.   7.  PE without acute cor pulmonale -Patient maintained on home regimen Eliquis.    8.  Hyperbilirubinemia -Improved.  9.  Cardiomegaly -2D echo EF unable to assess due to poor visualization, very limited echo due to poor visualization overall nondiagnostic.   10.  Pseudohypocalcemia -Corrected calcium at 9.04.  11.  Lower extremity edema -Lower extremity Dopplers negative for DVT.   -Status post IV Lasix with improvement with lower extremity edema.    12.  Constipation -Patient stated had a bowel movement on regimen of MiraLAX twice daily, Senokot-S twice daily.   -Patient was discharged on home regimen of MiraLAX as needed as well as Senokot-S twice daily. -Outpatient follow-up.   Procedures: Chest x-ray 03/22/2021 Lower extremity Dopplers 03/25/2021 Right upper extremity Doppler 03/25/2021 2D echo 03/23/2021  Consultations: None  Discharge Exam: Vitals:   03/28/21 2013 03/29/21 0615  BP: (!) 96/39 (!) 96/59  Pulse: 74 68  Resp: 16 18  Temp: 98.2 F (36.8 C) 98.5 F (36.9 C)  SpO2: 95% 96%    General: NAD Cardiovascular: RRR no murmurs rubs or gallops.  No JVD.  No lower extremity edema. Respiratory: CTA B.  Minimal intermittent wheezing.  No rhonchi.  No crackles.  Discharge Instructions   Discharge Instructions     Diet general   Complete by: As directed    Discharge wound care:   Complete by: As directed    As above   Discharge wound care:   Complete by: As directed    As above   Increase activity slowly   Complete by: As directed       Allergies as of 03/29/2021       Reactions   Crestor [rosuvastatin Calcium] Anaphylaxis, Other (See Comments)   Abdominal discomfort   Gadolinium Nausea And Vomiting    pt states she had nausea after receiving MAGNEVIST for breast imaging    Pravastatin Other (See Comments)   Locked jaw, weakness   Simvastatin Other (See Comments)   Weakness and locked jaw.   Penicillins Swelling, Rash   Patient tolerates amoxicillin Has patient had a PCN reaction causing immediate rash, facial/tongue/throat swelling, SOB or lightheadedness with hypotension: yes Has patient had a PCN reaction causing severe rash involving mucus membranes or skin necrosis: unknown Has patient had a PCN reaction that required hospitalization: unknown Has patient had a PCN reaction occurring within the last 10 years: no If all of the above answers are "NO", then may proceed with Cephalosporin use.        Medication List     TAKE these medications    alendronate 70 MG tablet Commonly known as: FOSAMAX TAKE 1 TAB ONCE A WEEK, AT LEAST 30 MIN BEFORE 1ST FOOD.DO NOT LIE DOWN FOR 30 MIN AFTER TAKING.   apixaban 5 MG Tabs tablet Commonly known as: Eliquis Take 1 tablet (5 mg total) by mouth 2 (two) times daily.   benazepril-hydrochlorthiazide 20-12.5 MG tablet Commonly known as: LOTENSIN HCT Take 1 tablet by mouth daily.   fluticasone 50 MCG/ACT nasal spray Commonly known as: FLONASE Place 2 sprays into both nostrils daily.  guaiFENesin 100 MG/5ML liquid Commonly known as: ROBITUSSIN Take 5 mLs by mouth every 4 (four) hours as needed for cough or to loosen phlegm.   isosorbide mononitrate 30 MG 24 hr tablet Commonly known as: IMDUR Take 1 tablet (30 mg total) by mouth daily.   levothyroxine 125 MCG tablet Commonly known as: SYNTHROID Take 1 tablet (125 mcg total) by mouth daily.   lidocaine 5 % Commonly known as: LIDODERM Place 1 patch onto the skin daily as needed for pain.   loratadine 10 MG tablet Commonly known as: CLARITIN Take 1 tablet (10 mg total) by mouth daily.   ondansetron 4 MG tablet Commonly known as: ZOFRAN Take 1 tablet (4 mg total) by mouth every 6 (six) hours as needed for nausea.   pantoprazole 40 MG tablet Commonly  known as: PROTONIX Take 1 tablet (40 mg total) by mouth daily.   Percocet 10-325 MG tablet Generic drug: oxyCODONE-acetaminophen Take 0.5-1 tablets by mouth every 2 (two) hours as needed for pain.   polyethylene glycol powder 17 GM/SCOOP powder Commonly known as: GLYCOLAX/MIRALAX DISSOLVE ONE CAPFUL (17 GM) IN LIQUID AND DRINK THREE TIMES DAILY AS NEEDED FOR MODERATE CONSTIPATION   potassium chloride 10 MEQ tablet Commonly known as: KLOR-CON TAKE 1 TABLET ONCE DAILY.   senna-docusate 8.6-50 MG tablet Commonly known as: Senokot-S Take 1 tablet by mouth 2 (two) times daily.   sodium chloride 0.65 % Soln nasal spray Commonly known as: OCEAN Place 1 spray into both nostrils as needed for congestion.   Ventolin HFA 108 (90 Base) MCG/ACT inhaler Generic drug: albuterol Inhale 2 puffs into the lungs 4 (four) times daily as needed for wheezing or shortness of breath. Use 2 puffs 3 times a day x4 days, then 4 times a day as needed. What changed: See the new instructions.   Vitamin D (Ergocalciferol) 1.25 MG (50000 UNIT) Caps capsule Commonly known as: DRISDOL TAKE ONE CAPSULE ONCE A WEEK What changed: See the new instructions.               Discharge Care Instructions  (From admission, onward)           Start     Ordered   03/29/21 0000  Discharge wound care:       Comments: As above   03/29/21 0907   03/28/21 0000  Discharge wound care:       Comments: As above   03/28/21 1500           Allergies  Allergen Reactions   Crestor [Rosuvastatin Calcium] Anaphylaxis and Other (See Comments)    Abdominal discomfort   Gadolinium Nausea And Vomiting     pt states she had nausea after receiving MAGNEVIST for breast imaging    Pravastatin Other (See Comments)    Locked jaw, weakness   Simvastatin Other (See Comments)    Weakness and locked jaw.   Penicillins Swelling and Rash    Patient tolerates amoxicillin  Has patient had a PCN reaction causing immediate  rash, facial/tongue/throat swelling, SOB or lightheadedness with hypotension: yes Has patient had a PCN reaction causing severe rash involving mucus membranes or skin necrosis: unknown Has patient had a PCN reaction that required hospitalization: unknown Has patient had a PCN reaction occurring within the last 10 years: no If all of the above answers are "NO", then may proceed with Cephalosporin use.     Follow-up Information     [facility]   Specialty: [place]  Why: Will follow you at home for Home Health Physical Therapy.        [name], MD Follow up.   Specialty: Internal Medicine Why: Follow-up as scheduled Contact information: [place] [address] [phone]                  The results of significant diagnostics from this hospitalization (including imaging, microbiology, ancillary and laboratory) are listed below for reference.    Significant Diagnostic Studies: DG Chest 2 View  Result Date: 03/02/2021 CLINICAL DATA:  Chest pain EXAM: CHEST - 2 VIEW COMPARISON:  10/02/2020 FINDINGS: Heart size appears upper limits of normal. Aortic atherosclerosis. Elevation of the right hemidiaphragm with probable right basilar atelectasis. Overlying breast prostheses. No pleural effusion or pneumothorax. Chronic compression deformities at T7, T9, and L1, not progressed from prior CT. Prior cement augmentation at T12. IMPRESSION: Elevation of the right hemidiaphragm with probable right basilar atelectasis. Electronically Signed   By: Davina Poke D.O.   On: 03/02/2021 12:14   CT Angio Chest PE W/Cm &/Or Wo Cm  Result Date: 03/02/2021 CLINICAL DATA:  PE suspected, history of left breast cancer EXAM: CT ANGIOGRAPHY CHEST WITH CONTRAST TECHNIQUE: Multidetector CT imaging of the chest was performed using the standard protocol during bolus administration of intravenous contrast. Multiplanar CT image  reconstructions and MIPs were obtained to evaluate the vascular anatomy. CONTRAST:  28mL OMNIPAQUE IOHEXOL 350 MG/ML SOLN COMPARISON:  10/02/2020 FINDINGS: Cardiovascular: Satisfactory opacification of the pulmonary arteries to the segmental level. No evidence of pulmonary embolism. Normal heart size. Left coronary artery calcifications. No pericardial effusion. Aortic atherosclerosis. Unchanged enlargement of the tubular ascending thoracic aorta, measuring up to 4.2 x 4.1 cm. Mediastinum/Nodes: No enlarged mediastinal, hilar, or axillary lymph nodes. Thyroid gland, trachea, and esophagus demonstrate no significant findings. Lungs/Pleura: Lungs are clear. No pleural effusion or pneumothorax. Upper Abdomen: No acute abnormality. Musculoskeletal: No chest wall abnormality. Multiple unchanged thoracic wedge deformities, status post vertebral cement augmentation of T12. Bilateral breast implants. Review of the MIP images confirms the above findings. IMPRESSION: 1. Negative examination for pulmonary embolism. 2. Unchanged enlargement of the tubular ascending thoracic aorta, measuring up to 4.2 x 4.1 cm. Recommend annual imaging followup by CTA or MRA if clinically appropriate. This recommendation follows 2010 ACCF/AHA/AATS/ACR/ASA/SCA/SCAI/SIR/STS/SVM Guidelines for the Diagnosis and Management of Patients with Thoracic Aortic Disease. Circulation. 2010; 121: H675-F163. Aortic aneurysm NOS (ICD10-I71.9) 3. Coronary artery disease. Aortic Atherosclerosis (ICD10-I70.0). Electronically Signed   By: Delanna Ahmadi M.D.   On: 03/02/2021 13:37   DG Chest Port 1 View  Result Date: 03/22/2021 CLINICAL DATA:  88 year old female with history of shortness of breath and cough. EXAM: PORTABLE CHEST 1 VIEW COMPARISON:  Chest x-ray 03/02/2021. FINDINGS: Opacity in the periphery of the left mid to lower lung concerning for airspace consolidation. Small left pleural effusion. Right lung is clear. No pneumothorax. No evidence of  pulmonary edema. Heart size is mildly enlarged. The patient is rotated to the left on today's exam, resulting in distortion of the mediastinal contours and reduced diagnostic sensitivity and specificity for mediastinal pathology. Atherosclerotic calcifications in the thoracic aorta. IMPRESSION: 1. Atelectasis and/or consolidation in the left lower lobe with small left pleural effusion. 2. Mild cardiomegaly. 3. Aortic atherosclerosis. Electronically Signed   By: Vinnie Langton M.D.   On: 03/22/2021 13:24   VAS [place] LOWER EXTREMITY VENOUS (DVT)  Result Date: 03/25/2021  Lower Venous DVT Study Patient Name:  BRENLEIGH COLLET WARD [name]  Date  of Exam:   03/25/2021 Medical Rec #: [id number]           Accession #:    [id number] Date of Birth: 24-Mar-1932           Patient Gender: F Patient Age:   89 years Exam Location:  [facility] Procedure:      VAS [place] LOWER EXTREMITY VENOUS (DVT) Referring Phys: PRANAV PATEL --------------------------------------------------------------------------------  Indications: Edema.  Limitations: Body habitus, poor ultrasound/tissue interface and patient unable to position properly for exam (immobility). Comparison Study: Previous exams on 5.28.2021 & 6.10.2017 were both negative for                   DVT. Performing Technologist: Rogelia Rohrer RVT, RDMS  Examination Guidelines: A complete evaluation includes B-mode imaging, spectral Doppler, color Doppler, and power Doppler as needed of all accessible portions of each vessel. Bilateral testing is considered an integral part of a complete examination. Limited examinations for reoccurring indications may be performed as noted. The reflux portion of the exam is performed with the patient in reverse Trendelenburg.  +---------+---------------+---------+-----------+----------+--------------+  RIGHT     Compressibility Phasicity Spontaneity Properties Thrombus Aging  +---------+---------------+---------+-----------+----------+--------------+  CFV        Full            Yes       Yes                                    +---------+---------------+---------+-----------+----------+--------------+  SFJ       Full                                                             +---------+---------------+---------+-----------+----------+--------------+  FV Prox   Full            Yes       Yes                                    +---------+---------------+---------+-----------+----------+--------------+  FV Mid    Full            Yes       Yes                                    +---------+---------------+---------+-----------+----------+--------------+  FV Distal                                                  Not visualized  +---------+---------------+---------+-----------+----------+--------------+  PFV       Full                                                             +---------+---------------+---------+-----------+----------+--------------+  POP       Full  Yes       Yes                                    +---------+---------------+---------+-----------+----------+--------------+  PTV                                                        Not visualized  +---------+---------------+---------+-----------+----------+--------------+  PERO      Full                                                             +---------+---------------+---------+-----------+----------+--------------+   Right Technical Findings: Not visualized segments include FV (distal) & PTV.  +---------+---------------+---------+-----------+----------+-------------------+  LEFT      Compressibility Phasicity Spontaneity Properties Thrombus Aging       +---------+---------------+---------+-----------+----------+-------------------+  CFV       Full            Yes       Yes                                         +---------+---------------+---------+-----------+----------+-------------------+  SFJ       Full                                                                   +---------+---------------+---------+-----------+----------+-------------------+  FV Prox   Full            Yes       Yes                                         +---------+---------------+---------+-----------+----------+-------------------+  FV Mid    Full            Yes       Yes                                         +---------+---------------+---------+-----------+----------+-------------------+  FV Distal Full            Yes       Yes                                         +---------+---------------+---------+-----------+----------+-------------------+  PFV       Full                                                                  +---------+---------------+---------+-----------+----------+-------------------+  POP       Full            Yes       Yes                                         +---------+---------------+---------+-----------+----------+-------------------+  PTV       Full                                                                  +---------+---------------+---------+-----------+----------+-------------------+  PERO                                                       Not well visualized  +---------+---------------+---------+-----------+----------+-------------------+     Summary: BILATERAL: - No evidence of deep vein thrombosis seen in the lower extremities, bilaterally (in areas imaged). - No evidence of superficial venous thrombosis in the lower extremities, bilaterally. -No evidence of popliteal cyst, bilaterally.   *See table(s) above for measurements and observations. Electronically signed by Harold Barban MD on 03/25/2021 at 11:32:28 PM.    Final    VAS [place] UPPER EXTREMITY VENOUS DUPLEX  Result Date: 03/25/2021 UPPER VENOUS STUDY  Patient Name:  BENTLEIGH WAREN [name]  Date of Exam:   03/25/2021 Medical Rec #: [id number]           Accession #:    [id number] Date of Birth: 1932-05-19           Patient Gender: F Patient Age:   72 years Exam Location:  [facility] Procedure:      VAS  [place] UPPER EXTREMITY VENOUS DUPLEX Referring Phys: Quillian Quince Elisabetta Mishra --------------------------------------------------------------------------------  Indications: RUE pain (from BP cuff) Comparison Study: No previous exams Performing Technologist: Jody Hill RVT, RDMS  Examination Guidelines: A complete evaluation includes B-mode imaging, spectral Doppler, color Doppler, and power Doppler as needed of all accessible portions of each vessel. Bilateral testing is considered an integral part of a complete examination. Limited examinations for reoccurring indications may be performed as noted.  Right Findings: +----------+------------+---------+-----------+----------+-------+  RIGHT      Compressible Phasicity Spontaneous Properties Summary  +----------+------------+---------+-----------+----------+-------+  IJV            Full        Yes        Yes                         +----------+------------+---------+-----------+----------+-------+  Subclavian     Full        Yes        Yes                         +----------+------------+---------+-----------+----------+-------+  Axillary       Full        Yes        Yes                         +----------+------------+---------+-----------+----------+-------+  Brachial       Full        Yes        Yes                         +----------+------------+---------+-----------+----------+-------+  Radial         Full                                               +----------+------------+---------+-----------+----------+-------+  Ulnar          Full                                               +----------+------------+---------+-----------+----------+-------+  Cephalic       Full                                               +----------+------------+---------+-----------+----------+-------+  Basilic        Full        Yes        Yes                         +----------+------------+---------+-----------+----------+-------+  Left Findings:  +----------+------------+---------+-----------+----------+-------+  LEFT       Compressible Phasicity Spontaneous Properties Summary  +----------+------------+---------+-----------+----------+-------+  Subclavian     Full        Yes        Yes                         +----------+------------+---------+-----------+----------+-------+  Summary:  Right: No evidence of deep vein thrombosis in the upper extremity. No evidence of superficial vein thrombosis in the upper extremity.  Left: No evidence of thrombosis in the subclavian.  *See table(s) above for measurements and observations.  Diagnosing physician: Harold Barban MD Electronically signed by Harold Barban MD on 03/25/2021 at 11:31:41 PM.    Final    ECHOCARDIOGRAM LIMITED  Result Date: 03/23/2021    ECHOCARDIOGRAM REPORT   Patient Name:   Angela [name] Date of Exam: 03/23/2021 Medical Rec #:  [id number]          Height:       68.0 in Accession #:    [id number]         Weight:       249.6 lb Date of Birth:  1932/08/19          BSA:          2.245 m Patient Age:    74 years           BP:           94/54 mmHg Patient Gender: F                  HR:           56 bpm. Exam Location:  Inpatient Procedure: 2D Echo, Limited Echo and Color Doppler Indications:    Abnormal EKG, Murmur  History:        Patient has no prior history of  Echocardiogram examinations.                 Risk Factors:Hypertension and Dyslipidemia.  Sonographer:    Jyl Heinz Referring Phys: [id number] DAVID MANUEL [place]  1. Very limited echo due to poor visualization, overall nondiagnostic. From technician notes visualization limited by breast implants and inability to press on chest due to pain. . Left ventricular ejection fraction, by estimation, is unable to assess, poor visualization%. The left ventricle has unable to assess, poor visualization function. Left ventricular endocardial border not optimally defined to evaluate regional wall motion. The left ventricular internal cavity  size was not well visualized. Left  ventricular diastolic function could not be evaluated.  2. Right ventricular systolic function was not well visualized. The right ventricular size is not well visualized.  3. The mitral valve was not well visualized. not well visualized mitral valve regurgitation. unable to assess mitral stenosis.  4. Tricuspid valve regurgitation unable to assess. unable to assess tricuspid stenosis.  5. The aortic valve was not well visualized. Aortic valve regurgitation not able to assess. not able to assess.  6. Pulmonic valve regurgitation unable to assess. FINDINGS  Left Ventricle: Very limited echo due to poor visualization, overall nondiagnostic. From technician notes visualization limited by breast implants and inability to press on chest due to pain. Left ventricular ejection fraction, by estimation, is unable to assess, poor visualization%. The left ventricle has unable to assess, poor visualization function. Left ventricular endocardial border not optimally defined to evaluate regional wall motion. The left ventricular internal cavity size was not well visualized. Suboptimal image quality limits for assessment of left ventricular hypertrophy. Left ventricular diastolic function could not be evaluated. Right Ventricle: The right ventricular size is not well visualized. Right vetricular wall thickness was not well visualized. Right ventricular systolic function was not well visualized. Left Atrium: Left atrial size was not well visualized. Right Atrium: Right atrial size was not well visualized. Pericardium: The pericardium was not well visualized. Mitral Valve: The mitral valve was not well visualized. Not well visualized mitral valve regurgitation. Unable to assess mitral valve stenosis. Tricuspid Valve: The tricuspid valve is not well visualized. Tricuspid valve regurgitation unable to assess. unable to assess tricuspid stenosis. Aortic Valve: The aortic valve was not well visualized.  Aortic valve regurgitation not able to assess. Not able to assess. Pulmonic Valve: The pulmonic valve was not well visualized. Pulmonic valve regurgitation unable to assess. Aorta: The aortic root was not well visualized. IAS/Shunts: The interatrial septum was not well visualized. Angela Dolly MD Electronically signed by Angela Dolly MD Signature Date/Time: 03/23/2021/10:50:11 AM    Final     Microbiology: Recent Results (from the past 240 hour(s))  Resp Panel by RT-PCR (Flu A&B, Covid) Nasopharyngeal Swab     Status: None   Collection Time: 03/22/21 12:10 PM   Specimen: Nasopharyngeal Swab; Nasopharyngeal(NP) swabs in vial transport medium  Result Value Ref Range Status   SARS Coronavirus 2 by RT PCR NEGATIVE NEGATIVE Final    Comment: (NOTE) SARS-CoV-2 target nucleic acids are NOT DETECTED.  The SARS-CoV-2 RNA is generally detectable in upper respiratory specimens during the acute phase of infection. The lowest concentration of SARS-CoV-2 viral copies this assay can detect is 138 copies/mL. A negative result does not preclude SARS-Cov-2 infection and should not be used as the sole basis for treatment or other patient management decisions. A negative result may occur with  improper specimen collection/handling, submission of specimen other than nasopharyngeal swab, presence of  viral mutation(s) within the areas targeted by this assay, and inadequate number of viral copies(<138 copies/mL). A negative result must be combined with clinical observations, patient history, and epidemiological information. The expected result is Negative.  Fact Sheet for Patients:  EntrepreneurPulse.com.au  Fact Sheet for Healthcare Providers:  IncredibleEmployment.be  This test is no t yet approved or cleared by the [place] FDA and  has been authorized for detection and/or diagnosis of SARS-CoV-2 by FDA under an Emergency Use Authorization (EUA). This EUA will  remain  in effect (meaning this test can be used) for the duration of the COVID-19 declaration under Section 564(b)(1) of the Act, 21 U.S.C.section 360bbb-3(b)(1), unless the authorization is terminated  or revoked sooner.       Influenza A by PCR NEGATIVE NEGATIVE Final   Influenza B by PCR NEGATIVE NEGATIVE Final    Comment: (NOTE) The Xpert Xpress SARS-CoV-2/FLU/RSV plus assay is intended as an aid in the diagnosis of influenza from Nasopharyngeal swab specimens and should not be used as a sole basis for treatment. Nasal washings and aspirates are unacceptable for Xpert Xpress SARS-CoV-2/FLU/RSV testing.  Fact Sheet for Patients: EntrepreneurPulse.com.au  Fact Sheet for Healthcare Providers: IncredibleEmployment.be  This test is not yet approved or cleared by the [place] FDA and has been authorized for detection and/or diagnosis of SARS-CoV-2 by FDA under an Emergency Use Authorization (EUA). This EUA will remain in effect (meaning this test can be used) for the duration of the COVID-19 declaration under Section 564(b)(1) of the Act, 21 U.S.C. section 360bbb-3(b)(1), unless the authorization is terminated or revoked.  Performed at [facility], [place] [address] [place], [place] [address]   Blood Culture (routine x 2)     Status: None   Collection Time: 03/22/21 12:10 PM   Specimen: BLOOD  Result Value Ref Range Status   Specimen Description   Final    BLOOD BLOOD LEFT FOREARM Performed at [facility] [address], [place], [place] [address]    Special Requests   Final    BOTTLES DRAWN AEROBIC AND ANAEROBIC Blood Culture adequate volume Performed at [facility] [address], [place], [place] [address]    Culture   Final    NO GROWTH 5 DAYS Performed at [facility], [place] [address] [place], [place] [address]    Report Status 03/27/2021 FINAL  Final  Blood  Culture (routine x 2)     Status: None   Collection Time: 03/22/21 12:15 PM   Specimen: BLOOD  Result Value Ref Range Status   Specimen Description   Final    BLOOD RIGHT ANTECUBITAL Performed at [facility] [address], [place], [place] [address]    Special Requests   Final    BOTTLES DRAWN AEROBIC AND ANAEROBIC Blood Culture adequate volume Performed at [facility] [address], [place], [place] [address]    Culture   Final    NO GROWTH 5 DAYS Performed at [facility], [place] [address], [place], [place] [address]    Report Status 03/27/2021 FINAL  Final  Urine Culture     Status: None   Collection Time: 03/23/21  2:53 PM   Specimen: Urine, Catheterized  Result Value Ref Range Status   Specimen Description   Final    URINE, CATHETERIZED Performed at [facility] [address], [place], [place] [address]    Special Requests   Final  NONE Performed at [facility], [place] [address], [place], [place] [address]    Culture   Final    NO GROWTH Performed at [facility], [place] [address] [place], [place] [address]    Report Status 03/24/2021 FINAL  Final     Labs: Basic Metabolic Panel: Recent Labs  Lab 03/22/21 1210 03/23/21 0412 03/24/21 0349 03/25/21 0849 03/26/21 0404 03/27/21 0407 03/28/21 0412  NA 132*   < > 134* 136 137 136 132*  K 2.8*   < > 4.0 3.5 3.9 3.0* 3.6  CL 94*   < > 101 100 98 91* 91*  CO2 30   < > 26 28 30 31  32  GLUCOSE 163*   < > 111* 96 115* 119* 97  BUN 8   < > 13 7* 7* 9 13  CREATININE 0.73   < > 0.63 0.50 0.49 0.58 0.87  CALCIUM 8.2*   < > 7.5* 8.5* 8.4* 8.7* 8.6*  MG 1.1*   < > 1.6* 1.6* 1.9 1.3* 2.3  PHOS 2.9  --   --   --   --   --   --    < > = values in this interval not displayed.   Liver Function Tests: Recent Labs  Lab 03/22/21 1210 03/23/21 0414 03/24/21 0349  AST 26 19 17   ALT 20 13 12   ALKPHOS 115 87 77  BILITOT 2.3* 1.2  0.5  PROT 7.3 5.4* 5.2*  ALBUMIN 3.7 2.7* 2.4*   No results for input(s): LIPASE, AMYLASE in the last 168 hours. No results for input(s): AMMONIA in the last 168 hours. CBC: Recent Labs  Lab 03/25/21 0355 03/26/21 0404 03/27/21 0407 03/28/21 0412 03/29/21 0343  WBC 11.0* 10.8* 15.0* 14.3* 14.6*  NEUTROABS 7.7 7.0 9.8* 9.0* 9.1*  HGB 12.9 13.4 13.5 13.9 12.0  HCT 39.1 38.7 40.0 42.0 35.9*  MCV 94.9 92.6 92.0 95.2 95.7  PLT 218 242 266 273 251   Cardiac Enzymes: No results for input(s): CKTOTAL, CKMB, CKMBINDEX, TROPONINI in the last 168 hours. BNP: BNP (last 3 results) Recent Labs    03/22/21 1212  BNP 103.5*    ProBNP (last 3 results) No results for input(s): PROBNP in the last 8760 hours.  CBG: Recent Labs  Lab 03/27/21 2040 03/28/21 0738 03/28/21 1114 03/28/21 1719 03/29/21 0736  GLUCAP 109* 104* 100* 98 98       Signed:  Irine Seal MD.  [facility] 03/29/2021, 9:08 AM

## 2021-03-28 NOTE — TOC Transition Note (Signed)
Transition of Care [facility]) - CM/SW Discharge Note   Patient Details  Name: Angela [name] MRN: [id number] Date of Birth: Jan 22, 1933  [facility] of Care [facility]) CM/SW Contact:  Ross Ludwig, LCSW Phone Number: 03/28/2021, 6:46 PM   Clinical Narrative:    Patient will need EMS transport home.  Patient will be going home with home health through [place] ([place]).  CSW signing off please reconsult with any other social work needs, home health agency has been notified of planned discharge.    Final next level of care: [place] Barriers to Discharge: Barriers Resolved   Patient Goals and CMS Choice Patient states their goals for this hospitalization and ongoing recovery are:: To return back home with home health. CMS Medicare.gov Compare Post Acute Care list provided to:: Patient Choice offered to / list presented to : Patient  Discharge Placement                       Discharge Plan and Services                          HH Arranged: RN, PT, OT, Nurse's Aide [facility] Agency: [place] ([facility]) Date [facility] Agency Contacted: 03/27/21 Time [place]: 1530 Representative spoke with at [place]: [place] ([place]) Interventions     Readmission Risk Interventions No flowsheet data found.

## 2021-03-29 LAB — CBC WITH DIFFERENTIAL/PLATELET
Abs Immature Granulocytes: 0.64 10*3/uL — ABNORMAL HIGH (ref 0.00–0.07)
Basophils Absolute: 0.1 10*3/uL (ref 0.0–0.1)
Basophils Relative: 1 %
Eosinophils Absolute: 0.5 10*3/uL (ref 0.0–0.5)
Eosinophils Relative: 3 %
HCT: 35.9 % — ABNORMAL LOW (ref 36.0–46.0)
Hemoglobin: 12 g/dL (ref 12.0–15.0)
Immature Granulocytes: 4 %
Lymphocytes Relative: 20 %
Lymphs Abs: 2.9 10*3/uL (ref 0.7–4.0)
MCH: 32 pg (ref 26.0–34.0)
MCHC: 33.4 g/dL (ref 30.0–36.0)
MCV: 95.7 fL (ref 80.0–100.0)
Monocytes Absolute: 1.5 10*3/uL — ABNORMAL HIGH (ref 0.1–1.0)
Monocytes Relative: 10 %
Neutro Abs: 9.1 10*3/uL — ABNORMAL HIGH (ref 1.7–7.7)
Neutrophils Relative %: 62 %
Platelets: 251 10*3/uL (ref 150–400)
RBC: 3.75 MIL/uL — ABNORMAL LOW (ref 3.87–5.11)
RDW: 13.6 % (ref 11.5–15.5)
WBC: 14.6 10*3/uL — ABNORMAL HIGH (ref 4.0–10.5)
nRBC: 0 % (ref 0.0–0.2)

## 2021-03-29 LAB — GLUCOSE, CAPILLARY: Glucose-Capillary: 98 mg/dL (ref 70–99)

## 2021-03-29 NOTE — Progress Notes (Signed)
Patient was to be discharged yesterday however due to transportation issues unable to go.  Patient stable.  Vital signs stable.  Stable for discharge today.  No charge.

## 2021-03-29 NOTE — TOC Transition Note (Signed)
Transition of Care [facility]) - CM/SW Discharge Note   Patient Details  Name: Angela [name] MRN: [id number] Date of Birth: [birth year]  Transition of Care [facility]) CM/SW Contact:  Ross Ludwig, LCSW Phone Number: 03/29/2021, 9:52 AM   Clinical Narrative:     EMS was delayed last night, patient is discharging back home today.  CSW signing off.   Final next level of care: [place] Barriers to Discharge: Barriers Resolved   Patient Goals and CMS Choice Patient states their goals for this hospitalization and ongoing recovery are:: To return back home with home health. CMS Medicare.gov Compare Post Acute Care list provided to:: Patient Choice offered to / list presented to : Patient  Discharge Placement                       Discharge Plan and Services                          HH Arranged: RN, PT, OT, Nurse's Aide [facility] Agency: [place] ([facility]) Date [facility] Agency Contacted: 03/27/21 Time [place]: 1530 Representative spoke with at [place]: [place] ([place]) Interventions     Readmission Risk Interventions No flowsheet data found.

## 2021-03-29 NOTE — Progress Notes (Signed)
Occupational Therapy Treatment Patient Details Name: Angela [name] MRN: [id number] DOB: Mar 01, 1933 Today's Date: 03/29/2021   History of present illness 88 y.o.female with medical history significant of osteoarthritis, breast cancer, aortic atherosclerosis, type II DM, GERD, hypertension, history of PE who is coming to the emergency department due to dyspnea associated with fever, night sweats, cough, congestion, fatigue, malaise, decreased appetite for the past 2 to 3 days.  Pt admitted 03/22/20 for sepsis due to pneumonia.   OT comments  Patient participated in UB bathing and grooming tasks sitting EOB on this date. Patient declined to participate in attempts at standing or transfers on this date. Patient was fixated on being able to transition home today and asking if ambulance  could come get her. Nursing made aware. Patient will need +2 for transfers and max a/total assist for ADLs in next level of care. Patient's discharge plan remains appropriate at this time. OT will continue to follow acutely.     Recommendations for follow up therapy are one component of a multi-disciplinary discharge planning process, led by the attending physician.  Recommendations may be updated based on patient status, additional functional criteria and insurance authorization.    Follow Up Recommendations  Home health OT    Assistance Recommended at Discharge Frequent or constant Supervision/Assistance  Patient can return home with the following  Assist for transportation;A lot of help with bathing/dressing/bathroom;Two people to help with walking and/or transfers   Equipment Recommendations       Recommendations for Other Services      Precautions / Restrictions Precautions Precautions: Fall Restrictions Weight Bearing Restrictions: No       Mobility Bed Mobility Overal bed mobility: Needs Assistance Bed Mobility: Supine to Sit     Supine to sit: Max assist Sit to supine: Mod assist;HOB  elevated   General bed mobility comments: patinet needed physical assist for trunk and BLE to get to edge of bed with increased time and cues for proper hand positioning. for sit to supine patient was able to control trunk and needed physical assist for BLE>    Transfers                         Balance Overall balance assessment: Needs assistance;History of Falls Sitting-balance support: Bilateral upper extremity supported;Feet supported Sitting balance-Leahy Scale: Poor Sitting balance - Comments: BUE support in static sitting this session                                   ADL either performed or assessed with clinical judgement   ADL Overall ADL's : Needs assistance/impaired     Grooming: Min guard;Sitting;Minimal assistance;Wash/dry face;Brushing hair;Wash/dry hands Grooming Details (indicate cue type and reason): on EOB Upper Body Bathing: Minimal assistance;Sitting Upper Body Bathing Details (indicate cue type and reason): EOB with increased time and encouragement to participate                           General ADL Comments: patient needed min A for sitting balance at times with lateral leaning and posterior leaning noted. patient was educated on importance of sitting up for trunk strength. patient declined to attempt to stand or transfer on this date stating she was going home and did not need to do this.    Extremity/Trunk Assessment  Vision       Perception     Praxis      Cognition Arousal/Alertness: Awake/alert Behavior During Therapy: WFL for tasks assessed/performed Overall Cognitive Status: No family/caregiver present to determine baseline cognitive functioning                                 General Comments: patient was very matter of fact with what she would and would not be doing during session.          Exercises     Shoulder Instructions       General Comments      Pertinent  Vitals/ Pain       Pain Assessment: Faces Faces Pain Scale: Hurts little more Pain Location: with movement of BLE Pain Intervention(s): Monitored during session;Repositioned  Home Living                                          Prior Functioning/Environment              Frequency  Min 2X/week        Progress Toward Goals  OT Goals(current goals can now be found in the care plan section)  Progress towards OT goals: Progressing toward goals     Plan Discharge plan remains appropriate    Co-evaluation                 AM-PAC OT "6 Clicks" Daily Activity     Outcome Measure   Help from another person eating meals?: A Little Help from another person taking care of personal grooming?: A Lot Help from another person toileting, which includes using toliet, bedpan, or urinal?: Total Help from another person bathing (including washing, rinsing, drying)?: A Lot Help from another person to put on and taking off regular upper body clothing?: A Little Help from another person to put on and taking off regular lower body clothing?: A Lot 6 Click Score: 13    End of Session    OT Visit Diagnosis: Unsteadiness on feet (R26.81);Muscle weakness (generalized) (M62.81);History of falling (Z91.81);Pain;Other symptoms and signs involving cognitive function   Activity Tolerance Patient tolerated treatment well   Patient Left in bed;with call bell/phone within reach;with bed alarm set   Nurse Communication Other (comment) (participation in session)        Time: 9604-5409 OT Time Calculation (min): 24 min  Charges: OT General Charges $OT Visit: 1 Visit OT Treatments $Self Care/Home Management : 23-37 mins  Jackelyn Poling OTR/L, MS Acute Rehabilitation Department Office# [phone] Pager# [phone]   Marcellina Millin 03/29/2021, 9:21 AM

## 2021-03-30 ENCOUNTER — Telehealth: Payer: Self-pay | Admitting: Internal Medicine

## 2021-03-30 ENCOUNTER — Telehealth: Payer: Self-pay

## 2021-03-30 DIAGNOSIS — Z7951 Long term (current) use of inhaled steroids: Secondary | ICD-10-CM | POA: Diagnosis not present

## 2021-03-30 DIAGNOSIS — E039 Hypothyroidism, unspecified: Secondary | ICD-10-CM | POA: Diagnosis not present

## 2021-03-30 DIAGNOSIS — R011 Cardiac murmur, unspecified: Secondary | ICD-10-CM | POA: Diagnosis not present

## 2021-03-30 DIAGNOSIS — Z86711 Personal history of pulmonary embolism: Secondary | ICD-10-CM | POA: Diagnosis not present

## 2021-03-30 DIAGNOSIS — I700 Atherosclerosis of aorta: Secondary | ICD-10-CM | POA: Diagnosis not present

## 2021-03-30 DIAGNOSIS — E785 Hyperlipidemia, unspecified: Secondary | ICD-10-CM | POA: Diagnosis not present

## 2021-03-30 DIAGNOSIS — Z853 Personal history of malignant neoplasm of breast: Secondary | ICD-10-CM | POA: Diagnosis not present

## 2021-03-30 DIAGNOSIS — E119 Type 2 diabetes mellitus without complications: Secondary | ICD-10-CM | POA: Diagnosis not present

## 2021-03-30 DIAGNOSIS — I119 Hypertensive heart disease without heart failure: Secondary | ICD-10-CM | POA: Diagnosis not present

## 2021-03-30 DIAGNOSIS — Z9181 History of falling: Secondary | ICD-10-CM | POA: Diagnosis not present

## 2021-03-30 DIAGNOSIS — Z993 Dependence on wheelchair: Secondary | ICD-10-CM | POA: Diagnosis not present

## 2021-03-30 DIAGNOSIS — J181 Lobar pneumonia, unspecified organism: Secondary | ICD-10-CM | POA: Diagnosis not present

## 2021-03-30 DIAGNOSIS — K219 Gastro-esophageal reflux disease without esophagitis: Secondary | ICD-10-CM | POA: Diagnosis not present

## 2021-03-30 DIAGNOSIS — Z7902 Long term (current) use of antithrombotics/antiplatelets: Secondary | ICD-10-CM | POA: Diagnosis not present

## 2021-03-30 DIAGNOSIS — Z79891 Long term (current) use of opiate analgesic: Secondary | ICD-10-CM | POA: Diagnosis not present

## 2021-03-30 DIAGNOSIS — M1990 Unspecified osteoarthritis, unspecified site: Secondary | ICD-10-CM | POA: Diagnosis not present

## 2021-03-30 DIAGNOSIS — Z9012 Acquired absence of left breast and nipple: Secondary | ICD-10-CM | POA: Diagnosis not present

## 2021-03-30 NOTE — Telephone Encounter (Signed)
Also needed Verbal orders for Social worker

## 2021-03-30 NOTE — Telephone Encounter (Signed)
Transition Care Management Follow-up Telephone Call Date of discharge and from where: [place] 03-29-21 Dx: Community acquired pneumonia How have you been since you were released from the hospital? Feeling better - some nausea Any questions or concerns? No  Items Reviewed: Did the pt receive and understand the discharge instructions provided? Yes  Medications obtained and verified? Yes  Other? No  Any new allergies since your discharge? no Dietary orders reviewed? Yes Do you have support at home? Yes   Home Care and Equipment/Supplies: Were home health services ordered? Yes- PT If so, what is the name of the agency? [facility]   Has the agency set up a time to come to the patient's home? Yes 03-30-21 Were any new equipment or medical supplies ordered?  No What is the name of the medical supply agency? na Were you able to get the supplies/equipment? not applicable Do you have any questions related to the use of the equipment or supplies? No  Functional Questionnaire: (I = Independent and D = Dependent) ADLs: I  Bathing/Dressing- D  Meal Prep- I  Eating- I  Maintaining continence- I  Transferring/Ambulation- I  Managing Meds- I  Follow up appointments reviewed:  PCP Hospital f/u appt confirmed? Yes  Scheduled to see Dr Ronnald Ramp  on 04-01-21 @ 1040am. [facility] f/u appt confirmed? No . Are transportation arrangements needed? No  If their condition worsens, is the pt aware to call PCP or go to the Emergency Dept.? Yes Was the patient provided with contact information for the PCP's office or ED? Yes Was to pt encouraged to call back with questions or concerns? Yes

## 2021-03-30 NOTE — Telephone Encounter (Signed)
Home Health verbal orders-caller/Agency: [facility]  Callback number: [phone]  Requesting OT/PT/Skilled nursing/Social Work/Speech: skilled nursing  Reason: Caller states she started care for patient today 03-30-2021  Caller states patient complained of pain in both knees and legs w/ pain level being 8/10  Frequency: 2w2 1w4

## 2021-03-31 DIAGNOSIS — E119 Type 2 diabetes mellitus without complications: Secondary | ICD-10-CM | POA: Diagnosis not present

## 2021-03-31 DIAGNOSIS — E039 Hypothyroidism, unspecified: Secondary | ICD-10-CM | POA: Diagnosis not present

## 2021-03-31 DIAGNOSIS — J181 Lobar pneumonia, unspecified organism: Secondary | ICD-10-CM | POA: Diagnosis not present

## 2021-03-31 DIAGNOSIS — E785 Hyperlipidemia, unspecified: Secondary | ICD-10-CM | POA: Diagnosis not present

## 2021-03-31 DIAGNOSIS — I119 Hypertensive heart disease without heart failure: Secondary | ICD-10-CM | POA: Diagnosis not present

## 2021-03-31 DIAGNOSIS — K219 Gastro-esophageal reflux disease without esophagitis: Secondary | ICD-10-CM | POA: Diagnosis not present

## 2021-03-31 NOTE — Telephone Encounter (Signed)
Verbal orders given for PT. 

## 2021-03-31 NOTE — Telephone Encounter (Signed)
Verbal orders given via VM 

## 2021-03-31 NOTE — Telephone Encounter (Signed)
[place] Name: [place] Name: [place] Phone #: [phone] Service Requested: PT Frequency of Visits: 2 week 4 1 week 1

## 2021-04-01 ENCOUNTER — Ambulatory Visit: Payer: Medicare Other | Admitting: Internal Medicine

## 2021-04-02 ENCOUNTER — Telehealth: Payer: Self-pay | Admitting: Internal Medicine

## 2021-04-02 DIAGNOSIS — K219 Gastro-esophageal reflux disease without esophagitis: Secondary | ICD-10-CM | POA: Diagnosis not present

## 2021-04-02 DIAGNOSIS — I119 Hypertensive heart disease without heart failure: Secondary | ICD-10-CM | POA: Diagnosis not present

## 2021-04-02 DIAGNOSIS — E039 Hypothyroidism, unspecified: Secondary | ICD-10-CM | POA: Diagnosis not present

## 2021-04-02 DIAGNOSIS — E785 Hyperlipidemia, unspecified: Secondary | ICD-10-CM | POA: Diagnosis not present

## 2021-04-02 DIAGNOSIS — E119 Type 2 diabetes mellitus without complications: Secondary | ICD-10-CM | POA: Diagnosis not present

## 2021-04-02 DIAGNOSIS — J181 Lobar pneumonia, unspecified organism: Secondary | ICD-10-CM | POA: Diagnosis not present

## 2021-04-02 NOTE — Telephone Encounter (Signed)
Angela [name] called from [place] and would like to inform Dr.Jones that the PT had informed her of a pain level of 8 (1 through 10) in her knees. She has taken her medication but was told to let you all know if she had anything higher than a 7.

## 2021-04-03 DIAGNOSIS — K219 Gastro-esophageal reflux disease without esophagitis: Secondary | ICD-10-CM | POA: Diagnosis not present

## 2021-04-03 DIAGNOSIS — E039 Hypothyroidism, unspecified: Secondary | ICD-10-CM | POA: Diagnosis not present

## 2021-04-03 DIAGNOSIS — J181 Lobar pneumonia, unspecified organism: Secondary | ICD-10-CM | POA: Diagnosis not present

## 2021-04-03 DIAGNOSIS — E119 Type 2 diabetes mellitus without complications: Secondary | ICD-10-CM | POA: Diagnosis not present

## 2021-04-03 DIAGNOSIS — E785 Hyperlipidemia, unspecified: Secondary | ICD-10-CM | POA: Diagnosis not present

## 2021-04-03 DIAGNOSIS — I119 Hypertensive heart disease without heart failure: Secondary | ICD-10-CM | POA: Diagnosis not present

## 2021-04-05 NOTE — Progress Notes (Signed)
Virtual Visit via Video Note  I connected with Angela [name] on 04/07/21 at  1:20 PM EST by a video enabled telemedicine application and verified that I am speaking with the correct person using two identifiers.   I discussed the limitations of evaluation and management by telemedicine and the availability of in person appointments. The patient expressed understanding and agreed to proceed.  Present for the visit:  Myself, Dr Billey Gosling, [facility] [place], nadine - her HHA.  The patient is currently at home and I am in the office.    No referring provider.    History of Present Illness: This visit is for follow up from the hospital.  She was not able to come into the office today.   Admitted 03/22/21 - 03/29/21 for community acquired PNA  She presented to the ED with dyspnea, fever, night sweats, cough, congestion, fatigue, malaise, dec appetite x 2-3 days.  She had emesis x 2 that day.  She was coughing up beige sputum and had pleuritic CP.  Temp in ED 102., O2 92, BP low normal, RR 26.  CXR showed consolidation in LLL w/ small pleural effusion.  received IVF, tylenol, levaquin, mag, kcl.  .    Admitted for spesis d/t CAP - bld cx neg.  Covid, flu neg.  Initially had azithro/rocephin, supportive meds.  Transitioned to oral azith, omnicef and d/c'd with 1 more day of omnicef.    Other tests  - Echo - poor quality, [place] of LE neg for DVT, RUE  [place] neg.    Only changes - discharged on miralax.  Other home meds not changed.   She did complete antibiotic.  She states she is feeling better, but not feeling great.  She is still very weak.  She has her home health aide.  She is doing physical therapy.  Nurse came out today and her blood pressure was 112/62 and oxygen level is 94% on room air.  She stated that there was no wheezing on exam.  She is still having some chest pain that she had when she went into the emergency room, but it is better.  She is still experiencing a cough that is worse in  the middle of the night-it is typically productive of clear phlegm if it is productive.  She still has some shortness of breath and wheezing.  All of her symptoms are better.  She has had some nausea and states constipation.  She is having some dizziness and leg swelling.  She typically takes MiraLAX for her constipation, but has not taken the past couple of days.  She is having some joint pain-this is chronic osteoarthritis of her knees.  She usually gets injections and has not had one in a while.  She is getting up and moving around and her home health aide is making sure that she is trying to increase her activity.  Review of Systems  Constitutional:  Positive for malaise/fatigue. Negative for chills and fever.       Appetite is low  Respiratory:  Positive for cough (esp at night - productive of clear), sputum production (clear), shortness of breath and wheezing.   Cardiovascular:  Positive for chest pain (since she had PNA - getting better) and leg swelling. Negative for palpitations.  Gastrointestinal:  Positive for nausea.  Musculoskeletal:  Positive for joint pain (knees).  Neurological:  Positive for dizziness. Negative for headaches.    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   Drives & independent   Has worked in office environment and modeled in past   Social Determinants of Radio broadcast assistant Strain: Low Risk    Difficulty of Paying Living Expenses: Not hard at  all  Food Insecurity: No Food Insecurity   Worried About Charity fundraiser in the Last Year: Never true   Arboriculturist in the Last Year: Never true  Transportation Needs: No Transportation Needs   Lack of Transportation (Medical): No   Lack of Transportation (Non-Medical): No  Physical Activity: Inactive   Days of Exercise per Week: 0 days   Minutes of Exercise per Session: 0 min  Stress: No Stress Concern Present   Feeling of Stress : Not at all  Social Connections: Not on file     Observations/Objective: Appears well in NAD     Lab Results  Component Value Date   WBC 14.6 (H) 03/29/2021   HGB 12.0 03/29/2021   HCT 35.9 (L) 03/29/2021   PLT 251 03/29/2021   GLUCOSE 97 03/28/2021   CHOL 232 (H) 01/13/2021   TRIG 117.0 01/13/2021   HDL 43.50 01/13/2021   LDLDIRECT 193.5 02/07/2013   LDLCALC 165 (H) 01/13/2021   ALT 12 03/24/2021   AST 17 03/24/2021   NA 132 (L) 03/28/2021   K 3.6 03/28/2021   CL 91 (L) 03/28/2021   CREATININE 0.87 03/28/2021   BUN 13 03/28/2021   CO2 32 03/28/2021   TSH 14.91 (H) 01/13/2021   INR 1.4 (H) 03/22/2021   HGBA1C 5.8 (H) 03/23/2021   MICROALBUR 0.4 12/05/2012    VAS [place] LOWER EXTREMITY VENOUS (DVT)  Lower Venous DVT Study  Patient Name:  Angela [name]  Date of Exam:   03/25/2021 Medical Rec #: [id number]           Accession #:    [id number] Date of Birth: [birth year]           Patient Gender: F Patient Age:   88 years Exam Location:  [facility] Procedure:      VAS [place] LOWER EXTREMITY VENOUS (DVT) Referring Phys: PRANAV PATEL  --------------------------------------------------------------------------------   Indications: Edema.   Limitations: Body habitus, poor ultrasound/tissue interface and patient unable to position properly for exam (immobility). Comparison Study: Previous exams on 5.28.2021 & 6.10.2017 were both negative for                   DVT.  Performing Technologist: Rogelia Rohrer RVT, RDMS     Examination Guidelines: A complete evaluation includes B-mode imaging, spectral Doppler, color Doppler, and power Doppler as needed of all accessible portions of each vessel. Bilateral testing is considered an integral part of a complete examination. Limited examinations for reoccurring indications may be performed as noted. The reflux portion of the exam is performed with the patient in reverse Trendelenburg.     +---------+---------------+---------+-----------+----------+--------------+  RIGHT     Compressibility Phasicity Spontaneity Properties Thrombus Aging  +---------+---------------+---------+-----------+----------+--------------+  CFV       Full            Yes       Yes                                    +---------+---------------+---------+-----------+----------+--------------+  SFJ       Full                                                             +---------+---------------+---------+-----------+----------+--------------+  FV Prox   Full            Yes       Yes                                    +---------+---------------+---------+-----------+----------+--------------+  FV Mid    Full            Yes       Yes                                    +---------+---------------+---------+-----------+----------+--------------+  FV Distal                                                  Not visualized  +---------+---------------+---------+-----------+----------+--------------+  PFV       Full                                                             +---------+---------------+---------+-----------+----------+--------------+  POP       Full            Yes       Yes                                    +---------+---------------+---------+-----------+----------+--------------+  PTV                                                        Not visualized  +---------+---------------+---------+-----------+----------+--------------+  PERO      Full                                                              +---------+---------------+---------+-----------+----------+--------------+       Right Technical Findings:  Not visualized segments include FV (distal) & PTV.   +---------+---------------+---------+-----------+----------+-------------------+  LEFT      Compressibility Phasicity Spontaneity Properties Thrombus Aging       +---------+---------------+---------+-----------+----------+-------------------+  CFV       Full            Yes       Yes                                         +---------+---------------+---------+-----------+----------+-------------------+  SFJ       Full                                                                  +---------+---------------+---------+-----------+----------+-------------------+  FV Prox   Full            Yes       Yes                                         +---------+---------------+---------+-----------+----------+-------------------+  FV Mid    Full            Yes       Yes                                         +---------+---------------+---------+-----------+----------+-------------------+  FV Distal Full            Yes       Yes                                         +---------+---------------+---------+-----------+----------+-------------------+  PFV       Full                                                                  +---------+---------------+---------+-----------+----------+-------------------+  POP       Full            Yes       Yes                                         +---------+---------------+---------+-----------+----------+-------------------+  PTV       Full                                                                  +---------+---------------+---------+-----------+----------+-------------------+  PERO  Not well visualized  +---------+---------------+---------+-----------+----------+-------------------+              Summary: BILATERAL: - No evidence of deep vein thrombosis seen in the lower extremities, bilaterally (in areas imaged). - No evidence of superficial venous thrombosis in the lower extremities, bilaterally. -No evidence of popliteal cyst, bilaterally.       *See table(s) above for measurements and observations.  Electronically signed by Harold Barban MD on 03/25/2021 at 11:32:28 PM.      Final   VAS [place] UPPER EXTREMITY VENOUS DUPLEX UPPER VENOUS STUDY    Patient Name:  Angela [name]  Date of Exam:   03/25/2021 Medical Rec #: [id number]           Accession #:    [id number] Date of Birth: 05/08/1932           Patient Gender: F Patient Age:   42 years Exam Location:  [facility] [place] Procedure:      VAS [place] UPPER EXTREMITY VENOUS DUPLEX Referring Phys: Quillian Quince THOMPSON  --------------------------------------------------------------------------------   Indications: RUE pain (from BP cuff) Comparison Study: No previous exams  Performing Technologist: Jody Hill RVT, RDMS    Examination Guidelines: A complete evaluation includes B-mode imaging, spectral Doppler, color Doppler, and power Doppler as needed of all accessible portions of each vessel. Bilateral testing is considered an integral part of a complete examination. Limited examinations for reoccurring indications may be performed as noted.    Right Findings: +----------+------------+---------+-----------+----------+-------+  RIGHT      Compressible Phasicity Spontaneous Properties Summary  +----------+------------+---------+-----------+----------+-------+  IJV            Full        Yes        Yes                         +----------+------------+---------+-----------+----------+-------+  Subclavian     Full        Yes        Yes                         +----------+------------+---------+-----------+----------+-------+  Axillary       Full        Yes        Yes                          +----------+------------+---------+-----------+----------+-------+  Brachial       Full        Yes        Yes                         +----------+------------+---------+-----------+----------+-------+  Radial         Full                                               +----------+------------+---------+-----------+----------+-------+  Ulnar          Full                                               +----------+------------+---------+-----------+----------+-------+  Cephalic       Full                                               +----------+------------+---------+-----------+----------+-------+  Basilic        Full        Yes        Yes                         +----------+------------+---------+-----------+----------+-------+    Left Findings: +----------+------------+---------+-----------+----------+-------+  LEFT       Compressible Phasicity Spontaneous Properties Summary  +----------+------------+---------+-----------+----------+-------+  Subclavian     Full        Yes        Yes                         +----------+------------+---------+-----------+----------+-------+    Summary:   Right: No evidence of deep vein thrombosis in the upper extremity. No evidence of superficial vein thrombosis in the upper extremity.   Left: No evidence of thrombosis in the subclavian.    *See table(s) above for measurements and observations.   Diagnosing physician: Harold Barban MD Electronically signed by Harold Barban MD on 03/25/2021 at 11:31:41 PM.      Final     Assessment and Plan:   Community-acquired pneumonia, left lower lobe: Recent hospitalization Overall symptoms are improving, but slowly Cough, wheeze and shortness of breath persistent, but all improved Discussed that her symptoms should slowly improve and if anything gets worse she needs to call or come in Lungs clear today by home nurse and oxygen saturation 94% Continue Ventolin 2 puffs every 4 hours as  needed Continue flutter valve  Constipation, nausea: Chronic Has not been taking her MiraLAX and she is constipated having nausea and some heartburn Restart MiraLAX and take daily.  If constipation does not improve can take twice daily until she has a bowel movement Continue Zofran 4 mg every 6 hours as needed  Hypertension: Chronic Blood pressure today by home health nurse was well controlled 112/62 Continue benazepril-hydrochlorothiazide 20-12.51 tab daily, Imdur 30 mg daily    Follow Up Instructions:   Encourage routine follow-up in office when able   I discussed the assessment and treatment plan with the patient. The patient was provided an opportunity to ask questions and all were answered. The patient agreed with the plan and demonstrated an understanding of the instructions.   The patient was advised to call back or seek an in-person evaluation if the symptoms worsen or if the condition fails to improve as anticipated.    Binnie Rail, MD

## 2021-04-06 DIAGNOSIS — I119 Hypertensive heart disease without heart failure: Secondary | ICD-10-CM | POA: Diagnosis not present

## 2021-04-06 DIAGNOSIS — E119 Type 2 diabetes mellitus without complications: Secondary | ICD-10-CM | POA: Diagnosis not present

## 2021-04-06 DIAGNOSIS — J181 Lobar pneumonia, unspecified organism: Secondary | ICD-10-CM | POA: Diagnosis not present

## 2021-04-06 DIAGNOSIS — E785 Hyperlipidemia, unspecified: Secondary | ICD-10-CM | POA: Diagnosis not present

## 2021-04-06 DIAGNOSIS — K219 Gastro-esophageal reflux disease without esophagitis: Secondary | ICD-10-CM | POA: Diagnosis not present

## 2021-04-06 DIAGNOSIS — E039 Hypothyroidism, unspecified: Secondary | ICD-10-CM | POA: Diagnosis not present

## 2021-04-07 ENCOUNTER — Telehealth: Payer: Self-pay | Admitting: Internal Medicine

## 2021-04-07 ENCOUNTER — Encounter: Payer: Self-pay | Admitting: Internal Medicine

## 2021-04-07 ENCOUNTER — Other Ambulatory Visit: Payer: Self-pay

## 2021-04-07 ENCOUNTER — Telehealth (INDEPENDENT_AMBULATORY_CARE_PROVIDER_SITE_OTHER): Payer: Medicare Other | Admitting: Internal Medicine

## 2021-04-07 DIAGNOSIS — E039 Hypothyroidism, unspecified: Secondary | ICD-10-CM | POA: Diagnosis not present

## 2021-04-07 DIAGNOSIS — I10 Essential (primary) hypertension: Secondary | ICD-10-CM | POA: Diagnosis not present

## 2021-04-07 DIAGNOSIS — K5900 Constipation, unspecified: Secondary | ICD-10-CM | POA: Diagnosis not present

## 2021-04-07 DIAGNOSIS — J181 Lobar pneumonia, unspecified organism: Secondary | ICD-10-CM | POA: Diagnosis not present

## 2021-04-07 DIAGNOSIS — J189 Pneumonia, unspecified organism: Secondary | ICD-10-CM | POA: Diagnosis not present

## 2021-04-07 DIAGNOSIS — K219 Gastro-esophageal reflux disease without esophagitis: Secondary | ICD-10-CM | POA: Diagnosis not present

## 2021-04-07 DIAGNOSIS — E119 Type 2 diabetes mellitus without complications: Secondary | ICD-10-CM | POA: Diagnosis not present

## 2021-04-07 DIAGNOSIS — E785 Hyperlipidemia, unspecified: Secondary | ICD-10-CM | POA: Diagnosis not present

## 2021-04-07 DIAGNOSIS — I119 Hypertensive heart disease without heart failure: Secondary | ICD-10-CM | POA: Diagnosis not present

## 2021-04-07 NOTE — Telephone Encounter (Signed)
[facility] called and said that Angela [name] only wanted 1 NURSE VISIT this week.  Just need Dr. Ronnald Ramp know this and ok.   Stanton Kidney [phone]

## 2021-04-08 ENCOUNTER — Telehealth: Payer: Self-pay | Admitting: Internal Medicine

## 2021-04-08 NOTE — Telephone Encounter (Signed)
Caller Justice Rocher states she is the patient's nurse aide  Caller states patient's pain management MD, Mohammed Kindle is requesting a clearance letter faxed, stating patient no longer has an upper respiratory infection  Caller is not listed on patient's dpr, no patient information was released  Fax [phone]

## 2021-04-08 NOTE — Telephone Encounter (Signed)
Letter faxed today

## 2021-04-08 NOTE — Telephone Encounter (Signed)
Letter printed.

## 2021-04-09 DIAGNOSIS — E785 Hyperlipidemia, unspecified: Secondary | ICD-10-CM | POA: Diagnosis not present

## 2021-04-09 DIAGNOSIS — I119 Hypertensive heart disease without heart failure: Secondary | ICD-10-CM | POA: Diagnosis not present

## 2021-04-09 DIAGNOSIS — J181 Lobar pneumonia, unspecified organism: Secondary | ICD-10-CM | POA: Diagnosis not present

## 2021-04-09 DIAGNOSIS — E039 Hypothyroidism, unspecified: Secondary | ICD-10-CM | POA: Diagnosis not present

## 2021-04-09 DIAGNOSIS — E119 Type 2 diabetes mellitus without complications: Secondary | ICD-10-CM | POA: Diagnosis not present

## 2021-04-09 DIAGNOSIS — K219 Gastro-esophageal reflux disease without esophagitis: Secondary | ICD-10-CM | POA: Diagnosis not present

## 2021-04-10 DIAGNOSIS — E119 Type 2 diabetes mellitus without complications: Secondary | ICD-10-CM | POA: Diagnosis not present

## 2021-04-10 DIAGNOSIS — J181 Lobar pneumonia, unspecified organism: Secondary | ICD-10-CM | POA: Diagnosis not present

## 2021-04-10 DIAGNOSIS — K219 Gastro-esophageal reflux disease without esophagitis: Secondary | ICD-10-CM | POA: Diagnosis not present

## 2021-04-10 DIAGNOSIS — I119 Hypertensive heart disease without heart failure: Secondary | ICD-10-CM | POA: Diagnosis not present

## 2021-04-10 DIAGNOSIS — E039 Hypothyroidism, unspecified: Secondary | ICD-10-CM | POA: Diagnosis not present

## 2021-04-10 DIAGNOSIS — E785 Hyperlipidemia, unspecified: Secondary | ICD-10-CM | POA: Diagnosis not present

## 2021-04-13 DIAGNOSIS — J181 Lobar pneumonia, unspecified organism: Secondary | ICD-10-CM | POA: Diagnosis not present

## 2021-04-13 DIAGNOSIS — E039 Hypothyroidism, unspecified: Secondary | ICD-10-CM | POA: Diagnosis not present

## 2021-04-13 DIAGNOSIS — K219 Gastro-esophageal reflux disease without esophagitis: Secondary | ICD-10-CM | POA: Diagnosis not present

## 2021-04-13 DIAGNOSIS — E119 Type 2 diabetes mellitus without complications: Secondary | ICD-10-CM | POA: Diagnosis not present

## 2021-04-13 DIAGNOSIS — I119 Hypertensive heart disease without heart failure: Secondary | ICD-10-CM | POA: Diagnosis not present

## 2021-04-13 DIAGNOSIS — E785 Hyperlipidemia, unspecified: Secondary | ICD-10-CM | POA: Diagnosis not present

## 2021-04-17 DIAGNOSIS — E039 Hypothyroidism, unspecified: Secondary | ICD-10-CM | POA: Diagnosis not present

## 2021-04-17 DIAGNOSIS — J181 Lobar pneumonia, unspecified organism: Secondary | ICD-10-CM | POA: Diagnosis not present

## 2021-04-17 DIAGNOSIS — I119 Hypertensive heart disease without heart failure: Secondary | ICD-10-CM | POA: Diagnosis not present

## 2021-04-17 DIAGNOSIS — E119 Type 2 diabetes mellitus without complications: Secondary | ICD-10-CM | POA: Diagnosis not present

## 2021-04-17 DIAGNOSIS — E785 Hyperlipidemia, unspecified: Secondary | ICD-10-CM | POA: Diagnosis not present

## 2021-04-17 DIAGNOSIS — K219 Gastro-esophageal reflux disease without esophagitis: Secondary | ICD-10-CM | POA: Diagnosis not present

## 2021-04-20 ENCOUNTER — Telehealth: Payer: Self-pay | Admitting: Internal Medicine

## 2021-04-20 DIAGNOSIS — E785 Hyperlipidemia, unspecified: Secondary | ICD-10-CM | POA: Diagnosis not present

## 2021-04-20 DIAGNOSIS — E119 Type 2 diabetes mellitus without complications: Secondary | ICD-10-CM | POA: Diagnosis not present

## 2021-04-20 DIAGNOSIS — K219 Gastro-esophageal reflux disease without esophagitis: Secondary | ICD-10-CM | POA: Diagnosis not present

## 2021-04-20 DIAGNOSIS — E039 Hypothyroidism, unspecified: Secondary | ICD-10-CM | POA: Diagnosis not present

## 2021-04-20 DIAGNOSIS — I119 Hypertensive heart disease without heart failure: Secondary | ICD-10-CM | POA: Diagnosis not present

## 2021-04-20 DIAGNOSIS — J181 Lobar pneumonia, unspecified organism: Secondary | ICD-10-CM | POA: Diagnosis not present

## 2021-04-20 NOTE — Telephone Encounter (Signed)
I have spoken to [facility] and given her the verbal order to proceed with doing a urine sample.

## 2021-04-20 NOTE — Telephone Encounter (Signed)
Angela [name] from [facility] has called and states pt is experiencing dysuria. Pt also has not been drinking water. Requesting an urine sample within the next couple of days. Also states pt is experiencing L back pain 10/10 rating, has had pain in the past. Also, pt has an appt with Orthopedic, but is unable to physically get there.   Callback #- [id number]

## 2021-04-22 DIAGNOSIS — R309 Painful micturition, unspecified: Secondary | ICD-10-CM | POA: Diagnosis not present

## 2021-04-23 ENCOUNTER — Other Ambulatory Visit: Payer: Self-pay

## 2021-04-23 ENCOUNTER — Emergency Department (HOSPITAL_COMMUNITY): Payer: Medicare Other

## 2021-04-23 ENCOUNTER — Telehealth: Payer: Self-pay

## 2021-04-23 ENCOUNTER — Inpatient Hospital Stay (HOSPITAL_COMMUNITY)
Admission: EM | Admit: 2021-04-23 | Discharge: 2021-04-30 | DRG: 690 | Disposition: A | Discharge status: Skilled Nursing Facility | Payer: Medicare Other | Attending: Internal Medicine | Admitting: Internal Medicine

## 2021-04-23 ENCOUNTER — Encounter (HOSPITAL_COMMUNITY): Payer: Self-pay

## 2021-04-23 DIAGNOSIS — R9431 Abnormal electrocardiogram [ECG] [EKG]: Secondary | ICD-10-CM | POA: Diagnosis not present

## 2021-04-23 DIAGNOSIS — I959 Hypotension, unspecified: Secondary | ICD-10-CM

## 2021-04-23 DIAGNOSIS — Z853 Personal history of malignant neoplasm of breast: Secondary | ICD-10-CM

## 2021-04-23 DIAGNOSIS — J9811 Atelectasis: Secondary | ICD-10-CM | POA: Diagnosis present

## 2021-04-23 DIAGNOSIS — E876 Hypokalemia: Secondary | ICD-10-CM | POA: Diagnosis present

## 2021-04-23 DIAGNOSIS — Z86711 Personal history of pulmonary embolism: Secondary | ICD-10-CM | POA: Diagnosis present

## 2021-04-23 DIAGNOSIS — I517 Cardiomegaly: Secondary | ICD-10-CM | POA: Diagnosis not present

## 2021-04-23 DIAGNOSIS — J181 Lobar pneumonia, unspecified organism: Secondary | ICD-10-CM | POA: Diagnosis not present

## 2021-04-23 DIAGNOSIS — K219 Gastro-esophageal reflux disease without esophagitis: Secondary | ICD-10-CM | POA: Diagnosis not present

## 2021-04-23 DIAGNOSIS — R109 Unspecified abdominal pain: Secondary | ICD-10-CM | POA: Diagnosis not present

## 2021-04-23 DIAGNOSIS — R197 Diarrhea, unspecified: Secondary | ICD-10-CM | POA: Diagnosis not present

## 2021-04-23 DIAGNOSIS — Z7901 Long term (current) use of anticoagulants: Secondary | ICD-10-CM

## 2021-04-23 DIAGNOSIS — R0902 Hypoxemia: Secondary | ICD-10-CM

## 2021-04-23 DIAGNOSIS — I119 Hypertensive heart disease without heart failure: Secondary | ICD-10-CM | POA: Diagnosis not present

## 2021-04-23 DIAGNOSIS — R1084 Generalized abdominal pain: Secondary | ICD-10-CM

## 2021-04-23 DIAGNOSIS — M549 Dorsalgia, unspecified: Secondary | ICD-10-CM | POA: Diagnosis present

## 2021-04-23 DIAGNOSIS — Z801 Family history of malignant neoplasm of trachea, bronchus and lung: Secondary | ICD-10-CM

## 2021-04-23 DIAGNOSIS — J189 Pneumonia, unspecified organism: Secondary | ICD-10-CM | POA: Diagnosis not present

## 2021-04-23 DIAGNOSIS — Z8052 Family history of malignant neoplasm of bladder: Secondary | ICD-10-CM

## 2021-04-23 DIAGNOSIS — Z888 Allergy status to other drugs, medicaments and biological substances status: Secondary | ICD-10-CM

## 2021-04-23 DIAGNOSIS — Z7401 Bed confinement status: Secondary | ICD-10-CM | POA: Diagnosis not present

## 2021-04-23 DIAGNOSIS — E861 Hypovolemia: Secondary | ICD-10-CM | POA: Diagnosis present

## 2021-04-23 DIAGNOSIS — K5730 Diverticulosis of large intestine without perforation or abscess without bleeding: Secondary | ICD-10-CM | POA: Diagnosis not present

## 2021-04-23 DIAGNOSIS — E669 Obesity, unspecified: Secondary | ICD-10-CM | POA: Diagnosis present

## 2021-04-23 DIAGNOSIS — K8689 Other specified diseases of pancreas: Secondary | ICD-10-CM | POA: Diagnosis not present

## 2021-04-23 DIAGNOSIS — Z880 Allergy status to penicillin: Secondary | ICD-10-CM

## 2021-04-23 DIAGNOSIS — E785 Hyperlipidemia, unspecified: Secondary | ICD-10-CM | POA: Diagnosis present

## 2021-04-23 DIAGNOSIS — A491 Streptococcal infection, unspecified site: Secondary | ICD-10-CM

## 2021-04-23 DIAGNOSIS — Z9012 Acquired absence of left breast and nipple: Secondary | ICD-10-CM | POA: Diagnosis not present

## 2021-04-23 DIAGNOSIS — Z800 Family history of malignant neoplasm of digestive organs: Secondary | ICD-10-CM

## 2021-04-23 DIAGNOSIS — N390 Urinary tract infection, site not specified: Secondary | ICD-10-CM | POA: Diagnosis present

## 2021-04-23 DIAGNOSIS — B952 Enterococcus as the cause of diseases classified elsewhere: Secondary | ICD-10-CM | POA: Diagnosis present

## 2021-04-23 DIAGNOSIS — Z9049 Acquired absence of other specified parts of digestive tract: Secondary | ICD-10-CM | POA: Diagnosis not present

## 2021-04-23 DIAGNOSIS — E119 Type 2 diabetes mellitus without complications: Secondary | ICD-10-CM | POA: Diagnosis present

## 2021-04-23 DIAGNOSIS — M25562 Pain in left knee: Secondary | ICD-10-CM | POA: Diagnosis present

## 2021-04-23 DIAGNOSIS — Z8041 Family history of malignant neoplasm of ovary: Secondary | ICD-10-CM

## 2021-04-23 DIAGNOSIS — R5381 Other malaise: Secondary | ICD-10-CM | POA: Diagnosis not present

## 2021-04-23 DIAGNOSIS — E871 Hypo-osmolality and hyponatremia: Secondary | ICD-10-CM | POA: Diagnosis present

## 2021-04-23 DIAGNOSIS — Z20822 Contact with and (suspected) exposure to covid-19: Secondary | ICD-10-CM | POA: Diagnosis present

## 2021-04-23 DIAGNOSIS — Z803 Family history of malignant neoplasm of breast: Secondary | ICD-10-CM

## 2021-04-23 DIAGNOSIS — Z79890 Hormone replacement therapy (postmenopausal): Secondary | ICD-10-CM

## 2021-04-23 DIAGNOSIS — Z8249 Family history of ischemic heart disease and other diseases of the circulatory system: Secondary | ICD-10-CM

## 2021-04-23 DIAGNOSIS — E039 Hypothyroidism, unspecified: Secondary | ICD-10-CM | POA: Diagnosis present

## 2021-04-23 DIAGNOSIS — Z1621 Resistance to vancomycin: Secondary | ICD-10-CM | POA: Diagnosis present

## 2021-04-23 DIAGNOSIS — Z7409 Other reduced mobility: Secondary | ICD-10-CM | POA: Diagnosis not present

## 2021-04-23 DIAGNOSIS — I10 Essential (primary) hypertension: Secondary | ICD-10-CM | POA: Diagnosis present

## 2021-04-23 DIAGNOSIS — G8929 Other chronic pain: Secondary | ICD-10-CM | POA: Diagnosis present

## 2021-04-23 DIAGNOSIS — R079 Chest pain, unspecified: Secondary | ICD-10-CM | POA: Diagnosis not present

## 2021-04-23 DIAGNOSIS — Z79899 Other long term (current) drug therapy: Secondary | ICD-10-CM

## 2021-04-23 DIAGNOSIS — E860 Dehydration: Secondary | ICD-10-CM | POA: Diagnosis not present

## 2021-04-23 DIAGNOSIS — M545 Low back pain, unspecified: Secondary | ICD-10-CM | POA: Diagnosis not present

## 2021-04-23 DIAGNOSIS — M25561 Pain in right knee: Secondary | ICD-10-CM | POA: Diagnosis present

## 2021-04-23 DIAGNOSIS — K760 Fatty (change of) liver, not elsewhere classified: Secondary | ICD-10-CM | POA: Diagnosis not present

## 2021-04-23 DIAGNOSIS — J811 Chronic pulmonary edema: Secondary | ICD-10-CM | POA: Diagnosis not present

## 2021-04-23 DIAGNOSIS — R000 Tachycardia, unspecified: Secondary | ICD-10-CM | POA: Diagnosis not present

## 2021-04-23 DIAGNOSIS — K429 Umbilical hernia without obstruction or gangrene: Secondary | ICD-10-CM | POA: Diagnosis not present

## 2021-04-23 DIAGNOSIS — R627 Adult failure to thrive: Secondary | ICD-10-CM | POA: Diagnosis not present

## 2021-04-23 LAB — RESP PANEL BY RT-PCR (FLU A&B, COVID) ARPGX2
Influenza A by PCR: NEGATIVE
Influenza B by PCR: NEGATIVE
SARS Coronavirus 2 by RT PCR: NEGATIVE

## 2021-04-23 LAB — CBC WITH DIFFERENTIAL/PLATELET
Abs Immature Granulocytes: 0.11 10*3/uL — ABNORMAL HIGH (ref 0.00–0.07)
Basophils Absolute: 0.1 10*3/uL (ref 0.0–0.1)
Basophils Relative: 0 %
Eosinophils Absolute: 0.4 10*3/uL (ref 0.0–0.5)
Eosinophils Relative: 3 %
HCT: 42.1 % (ref 36.0–46.0)
Hemoglobin: 14.1 g/dL (ref 12.0–15.0)
Immature Granulocytes: 1 %
Lymphocytes Relative: 16 %
Lymphs Abs: 2.3 10*3/uL (ref 0.7–4.0)
MCH: 30.9 pg (ref 26.0–34.0)
MCHC: 33.5 g/dL (ref 30.0–36.0)
MCV: 92.1 fL (ref 80.0–100.0)
Monocytes Absolute: 1.5 10*3/uL — ABNORMAL HIGH (ref 0.1–1.0)
Monocytes Relative: 10 %
Neutro Abs: 10 10*3/uL — ABNORMAL HIGH (ref 1.7–7.7)
Neutrophils Relative %: 70 %
Platelets: 243 10*3/uL (ref 150–400)
RBC: 4.57 MIL/uL (ref 3.87–5.11)
RDW: 13.3 % (ref 11.5–15.5)
WBC: 14.4 10*3/uL — ABNORMAL HIGH (ref 4.0–10.5)
nRBC: 0 % (ref 0.0–0.2)

## 2021-04-23 LAB — COMPREHENSIVE METABOLIC PANEL
ALT: 14 U/L (ref 0–44)
AST: 22 U/L (ref 15–41)
Albumin: 2.7 g/dL — ABNORMAL LOW (ref 3.5–5.0)
Alkaline Phosphatase: 114 U/L (ref 38–126)
Anion gap: 8 (ref 5–15)
BUN: 9 mg/dL (ref 8–23)
CO2: 31 mmol/L (ref 22–32)
Calcium: 7.9 mg/dL — ABNORMAL LOW (ref 8.9–10.3)
Chloride: 94 mmol/L — ABNORMAL LOW (ref 98–111)
Creatinine, Ser: 0.57 mg/dL (ref 0.44–1.00)
GFR, Estimated: >60 mL/min (ref >60)
Glucose, Bld: 113 mg/dL — ABNORMAL HIGH (ref 70–99)
Potassium: 2.9 mmol/L — ABNORMAL LOW (ref 3.5–5.1)
Sodium: 133 mmol/L — ABNORMAL LOW (ref 135–145)
Total Bilirubin: 1.1 mg/dL (ref 0.3–1.2)
Total Protein: 5.5 g/dL — ABNORMAL LOW (ref 6.5–8.1)

## 2021-04-23 LAB — CBG MONITORING, ED: Glucose-Capillary: 95 mg/dL (ref 70–99)

## 2021-04-23 LAB — MAGNESIUM: Magnesium: 1.1 mg/dL — ABNORMAL LOW (ref 1.7–2.4)

## 2021-04-23 MED ORDER — MAGNESIUM SULFATE 2 GM/50ML IV SOLN
2.0000 g | Freq: Once | INTRAVENOUS | Status: AC
Start: 2021-04-23 — End: 2021-04-23
  Administered 2021-04-23: 2 g via INTRAVENOUS
  Filled 2021-04-23: qty 50

## 2021-04-23 MED ORDER — POTASSIUM CHLORIDE CRYS ER 20 MEQ PO TBCR
40.0000 meq | EXTENDED_RELEASE_TABLET | Freq: Once | ORAL | Status: AC
Start: 2021-04-23 — End: 2021-04-23
  Administered 2021-04-23: 40 meq via ORAL
  Filled 2021-04-23: qty 2

## 2021-04-23 MED ORDER — SODIUM CHLORIDE 0.9 % IV BOLUS
500.0000 mL | Freq: Once | INTRAVENOUS | Status: AC
  Administered 2021-04-23: 500 mL via INTRAVENOUS

## 2021-04-23 MED ORDER — OXYCODONE-ACETAMINOPHEN 5-325 MG PO TABS
2.0000 | ORAL_TABLET | Freq: Once | ORAL | Status: AC
  Administered 2021-04-23: 2 via ORAL
  Filled 2021-04-23: qty 2

## 2021-04-23 MED ORDER — IOHEXOL 300 MG/ML  SOLN
100.0000 mL | Freq: Once | INTRAMUSCULAR | Status: AC | PRN
  Administered 2021-04-23: 100 mL via INTRAVENOUS

## 2021-04-23 MED ORDER — POTASSIUM CHLORIDE 10 MEQ/100ML IV SOLN
10.0000 meq | INTRAVENOUS | Status: AC
  Administered 2021-04-23 (×2): 10 meq via INTRAVENOUS
  Filled 2021-04-23 (×2): qty 100

## 2021-04-23 MED ORDER — SODIUM CHLORIDE 0.9 % IV SOLN
INTRAVENOUS | Status: DC
Start: 2021-04-23 — End: 2021-04-24

## 2021-04-23 NOTE — ED Provider Notes (Signed)
°  Provider Note MRN:  [id number]  Arrival date & time: 04/23/21    ED Course and Medical Decision Making  Assumed care from Dr. Eulis Foster at shift change.  AoC abdominal pain, poor pr intake, low K and Mag awaiting UA and EKG.  Some soft pressures, suspect need for admission.  Procedures  Final Clinical Impressions(s) / ED Diagnoses     ICD-10-CM   1. Hypokalemia  E87.6     2. Hypomagnesemia  E83.42       ED Discharge Orders     None       Discharge Instructions   None     Barth Kirks. Sedonia Small, [place] [email or web address]    Maudie Flakes, MD 04/24/21 [phone]

## 2021-04-23 NOTE — ED Provider Notes (Signed)
[facility] DEPT Provider Note   CSN: [id number] Arrival date & time: 04/23/21  1527     History  Chief Complaint  Patient presents with   Abdominal Pain    Angela [name] is a 88 y.o. female.  HPI She presents for evaluation of ongoing dysuria for several days, she lives alone, and has been able to get around her domicile using her walker.  She was hospitalized about a month ago for pneumonia.  She sees her PCP regularly.  She was recently encouraged to use Ventolin inhaler for help with ongoing shortness of breath.  She also complains of a numb sensation of her left leg.    Home Medications Prior to Admission medications   Medication Sig Start Date End Date Taking? Authorizing Provider  apixaban (ELIQUIS) 5 MG TABS tablet Take 1 tablet (5 mg total) by mouth 2 (two) times daily. 12/29/20  Yes [name], MD  benazepril-hydrochlorthiazide (LOTENSIN HCT) 20-12.5 MG tablet Take 1 tablet by mouth daily. 12/29/20  Yes [name], MD  diclofenac Sodium (VOLTAREN) 1 % GEL Apply 2 g topically 4 (four) times daily as needed (knee pain).   Yes [provider]  fluticasone (FLONASE) 50 MCG/ACT nasal spray Place 2 sprays into both nostrils daily. Patient taking differently: Place 2 sprays into both nostrils daily as needed for allergies. 03/29/21  Yes Eugenie Filler, MD  isosorbide mononitrate (IMDUR) 30 MG 24 hr tablet Take 1 tablet (30 mg total) by mouth daily. 12/29/20  Yes [name], MD  levothyroxine (SYNTHROID) 125 MCG tablet Take 1 tablet (125 mcg total) by mouth daily. 01/13/21  Yes [name], MD  lidocaine (LIDODERM) 5 % Place 1 patch onto the skin daily as needed for pain. 02/26/21  Yes [provider]  loratadine (CLARITIN) 10 MG tablet Take 1 tablet (10 mg total) by mouth daily. 03/29/21  Yes Eugenie Filler, MD  ofloxacin (OCUFLOX) 0.3 % ophthalmic solution Place 1 drop into the right eye 2 (two) times daily.    Yes [provider]  ondansetron (ZOFRAN) 4 MG tablet Take 1 tablet (4 mg total) by mouth every 6 (six) hours as needed for nausea. Patient taking differently: Take 4 mg by mouth every 6 (six) hours as needed for nausea or vomiting. 03/28/21  Yes Eugenie Filler, MD  pantoprazole (PROTONIX) 40 MG tablet Take 1 tablet (40 mg total) by mouth daily. 12/30/20  Yes [name], MD  PERCOCET 10-325 MG tablet Take 0.5 tablets by mouth every 8 (eight) hours as needed for pain. 02/26/21  Yes [provider]  polyethylene glycol powder (GLYCOLAX/MIRALAX) 17 GM/SCOOP powder DISSOLVE ONE CAPFUL (17 GM) IN LIQUID AND DRINK THREE TIMES DAILY AS NEEDED FOR MODERATE CONSTIPATION Patient taking differently: Take 1 Container by mouth daily. DISSOLVE ONE CAPFUL (17 GM) IN LIQUID AND DRINK THREE TIMES DAILY AS NEEDED FOR MODERATE CONSTIPATION 03/16/21  Yes [name], MD  potassium chloride (KLOR-CON) 10 MEQ tablet TAKE 1 TABLET ONCE DAILY. Patient taking differently: Take 10 mEq by mouth daily. 12/18/20  Yes [name], MD  senna-docusate (SENOKOT-S) 8.6-50 MG tablet Take 1 tablet by mouth 2 (two) times daily. 03/28/21  Yes Eugenie Filler, MD  sodium chloride (OCEAN) 0.65 % SOLN nasal spray Place 1 spray into both nostrils daily.   Yes [provider]  VENTOLIN HFA 108 (90 Base) MCG/ACT inhaler Inhale 2 puffs into the lungs 4 (four) times daily as needed  for wheezing or shortness of breath. Use 2 puffs 3 times a day x4 days, then 4 times a day as needed. Patient taking differently: Inhale 2 puffs into the lungs in the morning, at noon, in the evening, and at bedtime. 03/28/21  Yes Eugenie Filler, MD  Vitamin D, Ergocalciferol, (DRISDOL) 1.25 MG (50000 UNIT) CAPS capsule TAKE ONE CAPSULE ONCE A WEEK Patient taking differently: Take 50,000 Units by mouth every 7 (seven) days. Fridays 03/16/21  Yes [name], MD  alendronate (FOSAMAX) 70 MG tablet TAKE 1 TAB ONCE A WEEK, AT  LEAST 30 MIN BEFORE 1ST FOOD.DO NOT LIE DOWN FOR 30 MIN AFTER TAKING. Patient not taking: Reported on 01/13/2021 12/18/20   [name], MD      Allergies    Crestor [rosuvastatin calcium], Gadolinium, Pravastatin, Simvastatin, and Penicillins    Review of Systems   Review of Systems  Physical Exam Updated Vital Signs BP 140/78    Pulse 89    Temp 98.6 F (37 C) (Oral)    Resp 18    SpO2 96%  Physical Exam Vitals and nursing note reviewed.  Constitutional:      General: She is not in acute distress.    Appearance: She is well-developed. She is ill-appearing. She is not toxic-appearing or diaphoretic.  HENT:     Head: Normocephalic and atraumatic.     Right Ear: External ear normal.     Left Ear: External ear normal.     Mouth/Throat:     Mouth: Mucous membranes are moist.     Pharynx: No oropharyngeal exudate or posterior oropharyngeal erythema.  Eyes:     Conjunctiva/sclera: Conjunctivae normal.     Pupils: Pupils are equal, round, and reactive to light.  Neck:     Trachea: Phonation normal.  Cardiovascular:     Rate and Rhythm: Normal rate and regular rhythm.     Heart sounds: Normal heart sounds.  Pulmonary:     Effort: Pulmonary effort is normal. No respiratory distress.     Breath sounds: Normal breath sounds. No stridor.  Abdominal:     General: There is no distension.     Palpations: Abdomen is soft. There is no mass.     Tenderness: There is no abdominal tenderness.  Musculoskeletal:        General: Normal range of motion.     Cervical back: Normal range of motion and neck supple.  Skin:    General: Skin is warm and dry.  Neurological:     Mental Status: She is alert and oriented to person, place, and time.     Cranial Nerves: No cranial nerve deficit.     Sensory: No sensory deficit.     Motor: No abnormal muscle tone.     Coordination: Coordination normal.     Comments: She has equal touch sensation of the feet bilaterally.  Psychiatric:        Mood and  Affect: Mood normal.        Behavior: Behavior normal.        Thought Content: Thought content normal.        Judgment: Judgment normal.    ED Results / Procedures / Treatments   Labs (all labs ordered are listed, but only abnormal results are displayed) Labs Reviewed  COMPREHENSIVE METABOLIC PANEL - Abnormal; Notable for the following components:      Result Value   Sodium 133 (*)    Potassium 2.9 (*)    Chloride 94 (*)  Glucose, Bld 113 (*)    Calcium 7.9 (*)    Total Protein 5.5 (*)    Albumin 2.7 (*)    All other components within normal limits  CBC WITH DIFFERENTIAL/PLATELET - Abnormal; Notable for the following components:   WBC 14.4 (*)    Neutro Abs 10.0 (*)    Monocytes Absolute 1.5 (*)    Abs Immature Granulocytes 0.11 (*)    All other components within normal limits  MAGNESIUM - Abnormal; Notable for the following components:   Magnesium 1.1 (*)    All other components within normal limits  RESP PANEL BY RT-PCR (FLU A&B, COVID) ARPGX2  URINALYSIS, ROUTINE W REFLEX MICROSCOPIC  CBG MONITORING, ED    EKG None  Radiology DG Chest 2 View  Result Date: 04/23/2021 CLINICAL DATA:  Malaise. Lower abdominal pain. Painful urination and diarrhea. EXAM: CHEST - 2 VIEW COMPARISON:  03/02/2021. FINDINGS: Exam detail is diminished due to patient's body habitus and patient positioning. Stable cardiomediastinal contours. Asymmetric elevation of the right hemidiaphragm identified. Diffuse pulmonary vascular congestion noted. No superimposed airspace consolidation. IMPRESSION: 1. Diffuse pulmonary vascular congestion. 2. Stable asymmetric elevation of the right hemidiaphragm. Electronically Signed   By: Kerby Moors M.D.   On: 04/23/2021 17:31   CT Abdomen Pelvis W Contrast  Result Date: 04/23/2021 CLINICAL DATA:  Bladder dysfunction. EXAM: CT ABDOMEN AND PELVIS WITH CONTRAST TECHNIQUE: Multidetector CT imaging of the abdomen and pelvis was performed using the standard protocol  following bolus administration of intravenous contrast. RADIATION DOSE REDUCTION: This exam was performed according to the departmental dose-optimization program which includes automated exposure control, adjustment of the mA and/or kV according to patient size and/or use of iterative reconstruction technique. CONTRAST:  142mL OMNIPAQUE IOHEXOL 300 MG/ML  SOLN COMPARISON:  05/21/2020. FINDINGS: Lower chest: The heart is enlarged. Atelectasis is present at the lung bases. Hepatobiliary: Fatty infiltration of the liver is noted. No focal abnormality is identified. The gallbladder is not visualized on exam. No biliary ductal dilatation. Pancreas: Pancreatic atrophy is noted. No ductal dilatation or surrounding inflammatory changes. Spleen: Normal in size without focal abnormality. Adrenals/Urinary Tract: The adrenal glands are within normal limits. No renal calculus or hydronephrosis. There is a subcentimeter hypodensity in the upper pole of the left kidney, likely cyst. A small amount of air is noted in the urinary bladder which may be iatrogenic. Stomach/Bowel: The stomach is within normal limits. No bowel obstruction, free air, or pneumatosis. A few scattered diverticula are present along the colon without evidence of diverticulitis. There is fatty infiltration of the walls of the ascending and transverse colon suggesting chronic inflammatory changes. The appendix is not visualized on exam. Vascular/Lymphatic: Aortic atherosclerosis. No enlarged abdominal or pelvic lymph nodes. Reproductive: Status post hysterectomy. No adnexal masses. Other: Small fat containing umbilical hernia. No ascites. A left breast implant is noted. Bilateral breast implants are noted. Musculoskeletal: There are compression deformities at T10, T12, L1, L2, and L5. Vertebroplasty changes are noted at T12. The compression deformity L2 is new from 2020. Multilevel degenerative changes are noted in the thoracic spine. IMPRESSION: 1. Small focus  of air in the urinary bladder which may be iatrogenic. No bladder wall thickening or obstructive uropathy. 2. Multilevel compression deformities in the thoracolumbar spine. The compression deformity L2 is new from 2020, however indeterminate in age. 3. Aortic atherosclerosis. 4. Cardiomegaly. 5. Remaining findings as described above. Electronically Signed   By: Brett Fairy M.D.   On: 04/23/2021 20:23    Procedures  Procedures    Medications Ordered in ED Medications  0.9 %  sodium chloride infusion ( Intravenous New Bag/Given 04/23/21 1752)  magnesium sulfate IVPB 2 g 50 mL (2 g Intravenous New Bag/Given 04/23/21 2218)  sodium chloride 0.9 % bolus 500 mL (0 mLs Intravenous Stopped 04/23/21 1819)  potassium chloride SA (KLOR-CON M) CR tablet 40 mEq (40 mEq Oral Given 04/23/21 1818)  potassium chloride 10 mEq in 100 mL IVPB (0 mEq Intravenous Stopped 04/23/21 2104)  sodium chloride 0.9 % bolus 500 mL (0 mLs Intravenous Stopped 04/23/21 2104)  iohexol (OMNIPAQUE) 300 MG/ML solution 100 mL (100 mLs Intravenous Contrast Given 04/23/21 1958)  oxyCODONE-acetaminophen (PERCOCET/ROXICET) 5-325 MG per tablet 2 tablet (2 tablets Oral Given 04/23/21 2046)    ED Course/ Medical Decision Making/ A&P                           Medical Decision Making Patient presents for evaluation of "dysuria," and a sensation of not being able to urinate for several days.  She also had decreased appetite.  She complains of abdominal pain.  She has chronic pain and takes narcotics regularly.  Problems Addressed: Hypokalemia: acute illness or injury that poses a threat to life or bodily functions Hypomagnesemia: undiagnosed new problem with uncertain prognosis  Amount and/or Complexity of Data Reviewed Labs: ordered.    Details: CBC, metabolic panel, respiratory panel, urinalysis, magnesium-normal except sodium low, potassium low, chloride low, glucose high, total protein low, albumin low, weak, Radiology: ordered and independent  interpretation performed.    Details: Chest x-ray, CT abdomen pelvis-no definite acute problem.  Significant spinal arthritis, and compression fractures.  No clear new compression fracture.  No evidence for pneumonia or heart failure.  Risk Prescription drug management. Risk Details: Patient presenting for decreased appetite and decreased urination likely associated symptoms.  Nonspecific abdominal pain, possibly related to chronic pain and spinal disorder.  Incidental recurrent hypokalemia and hypomagnesemia.  She is apparently not been treated for low magnesium with oral medication previously.  CT imaging does not show acute problems.  Possible UTI, urinalysis pending at the time of transition of care.  Potassium and magnesium repleted, with oral and IV medications.  Plan on disposition by Dr. Sedonia Small after completion of urinalysis results.  Critical Care Total time providing critical care: 30-74 minutes          Final Clinical Impression(s) / ED Diagnoses Final diagnoses:  Hypokalemia  Hypomagnesemia  Generalized abdominal pain    Rx / DC Orders ED Discharge Orders     None         Daleen Bo, MD 04/23/21 2314

## 2021-04-23 NOTE — ED Provider Triage Note (Signed)
Emergency Medicine Provider Triage Evaluation Note  Angela [name] , a 88 y.o. female  was evaluated in triage.  Pt complains of dysuria.  This has been present for several days and she has been working with her PCP to get it checked out.  She also saw her PCP with a video visit, 2 weeks ago.  At that time, she was being seen for hospital follow-up after admission for pneumonia earlier in the month.  During hospitalization she was treated for sepsis.  At that time she was advised to continue treatments with Ventolin inhaler as needed and use MiraLAX for constipation.  Review of Systems  Positive: Dysuria, left leg feels numb, diarrhea Negative: No vomiting, fever, cough  Physical Exam  BP (!) 94/50 (BP Location: Right Arm)    Pulse (!) 103    Temp 98.6 F (37 C) (Oral)    Resp 18    SpO2 99%  Gen:   Awake, no distress she is overweight Resp:  Normal effort  MSK:   Moves extremities without difficulty  Other:  Abdomen is soft and nontender  Medical Decision Making  Medically screening exam initiated at 4:24 PM.  Appropriate orders placed.  Charlie Pitter Ward [name] was informed that the remainder of the evaluation will be completed by another provider, this initial triage assessment does not replace that evaluation, and the importance of remaining in the ED until their evaluation is complete.  Evaluation for infectious diarrhea and UTI   Daleen Bo, MD 04/23/21 [phone]

## 2021-04-23 NOTE — Telephone Encounter (Signed)
Please disregard the last message. Mary from [facility] states the orders is on there account and the results will be release to you once completed. She apologize for the confusion.

## 2021-04-23 NOTE — Telephone Encounter (Signed)
Requesting stat Order for urine specimen being dropped off at [facility].

## 2021-04-23 NOTE — ED Notes (Addendum)
Donnal Debar (caregiver) can be reached at [phone]

## 2021-04-23 NOTE — ED Triage Notes (Addendum)
Pt BIB EMS from home. Pt c/o lower abdominal pain x4 days. Pt c/o painful urination and diarrhea. Pt has frequent hx of UTI's. Pt is A&Ox4.  CBG 163 22G R AC

## 2021-04-24 DIAGNOSIS — K219 Gastro-esophageal reflux disease without esophagitis: Secondary | ICD-10-CM | POA: Diagnosis present

## 2021-04-24 DIAGNOSIS — E119 Type 2 diabetes mellitus without complications: Secondary | ICD-10-CM | POA: Diagnosis present

## 2021-04-24 DIAGNOSIS — M549 Dorsalgia, unspecified: Secondary | ICD-10-CM | POA: Diagnosis not present

## 2021-04-24 DIAGNOSIS — E876 Hypokalemia: Secondary | ICD-10-CM | POA: Diagnosis not present

## 2021-04-24 DIAGNOSIS — R5381 Other malaise: Secondary | ICD-10-CM | POA: Diagnosis not present

## 2021-04-24 DIAGNOSIS — Z7901 Long term (current) use of anticoagulants: Secondary | ICD-10-CM | POA: Diagnosis not present

## 2021-04-24 DIAGNOSIS — J189 Pneumonia, unspecified organism: Secondary | ICD-10-CM | POA: Diagnosis not present

## 2021-04-24 DIAGNOSIS — Z20822 Contact with and (suspected) exposure to covid-19: Secondary | ICD-10-CM | POA: Diagnosis present

## 2021-04-24 DIAGNOSIS — Z7409 Other reduced mobility: Secondary | ICD-10-CM | POA: Diagnosis not present

## 2021-04-24 DIAGNOSIS — Z9049 Acquired absence of other specified parts of digestive tract: Secondary | ICD-10-CM | POA: Diagnosis not present

## 2021-04-24 DIAGNOSIS — I959 Hypotension, unspecified: Secondary | ICD-10-CM | POA: Diagnosis not present

## 2021-04-24 DIAGNOSIS — E785 Hyperlipidemia, unspecified: Secondary | ICD-10-CM | POA: Diagnosis present

## 2021-04-24 DIAGNOSIS — N390 Urinary tract infection, site not specified: Secondary | ICD-10-CM | POA: Diagnosis present

## 2021-04-24 DIAGNOSIS — Z86711 Personal history of pulmonary embolism: Secondary | ICD-10-CM | POA: Diagnosis not present

## 2021-04-24 DIAGNOSIS — I10 Essential (primary) hypertension: Secondary | ICD-10-CM

## 2021-04-24 DIAGNOSIS — R627 Adult failure to thrive: Secondary | ICD-10-CM | POA: Diagnosis not present

## 2021-04-24 DIAGNOSIS — J9811 Atelectasis: Secondary | ICD-10-CM | POA: Diagnosis present

## 2021-04-24 DIAGNOSIS — G8929 Other chronic pain: Secondary | ICD-10-CM | POA: Diagnosis present

## 2021-04-24 DIAGNOSIS — E039 Hypothyroidism, unspecified: Secondary | ICD-10-CM | POA: Diagnosis present

## 2021-04-24 DIAGNOSIS — E871 Hypo-osmolality and hyponatremia: Secondary | ICD-10-CM | POA: Diagnosis present

## 2021-04-24 DIAGNOSIS — R0902 Hypoxemia: Secondary | ICD-10-CM | POA: Diagnosis present

## 2021-04-24 DIAGNOSIS — B952 Enterococcus as the cause of diseases classified elsewhere: Secondary | ICD-10-CM | POA: Diagnosis present

## 2021-04-24 DIAGNOSIS — M545 Low back pain, unspecified: Secondary | ICD-10-CM | POA: Diagnosis not present

## 2021-04-24 DIAGNOSIS — E860 Dehydration: Secondary | ICD-10-CM | POA: Diagnosis not present

## 2021-04-24 DIAGNOSIS — Z9012 Acquired absence of left breast and nipple: Secondary | ICD-10-CM | POA: Diagnosis not present

## 2021-04-24 DIAGNOSIS — I517 Cardiomegaly: Secondary | ICD-10-CM | POA: Diagnosis not present

## 2021-04-24 DIAGNOSIS — E861 Hypovolemia: Secondary | ICD-10-CM | POA: Diagnosis present

## 2021-04-24 DIAGNOSIS — Z1621 Resistance to vancomycin: Secondary | ICD-10-CM | POA: Diagnosis present

## 2021-04-24 DIAGNOSIS — E669 Obesity, unspecified: Secondary | ICD-10-CM | POA: Diagnosis present

## 2021-04-24 DIAGNOSIS — R1084 Generalized abdominal pain: Secondary | ICD-10-CM

## 2021-04-24 DIAGNOSIS — Z7401 Bed confinement status: Secondary | ICD-10-CM | POA: Diagnosis not present

## 2021-04-24 DIAGNOSIS — Z853 Personal history of malignant neoplasm of breast: Secondary | ICD-10-CM | POA: Diagnosis not present

## 2021-04-24 LAB — CBC
HCT: 44 % (ref 36.0–46.0)
Hemoglobin: 14.3 g/dL (ref 12.0–15.0)
MCH: 30.2 pg (ref 26.0–34.0)
MCHC: 32.5 g/dL (ref 30.0–36.0)
MCV: 92.8 fL (ref 80.0–100.0)
Platelets: 239 10*3/uL (ref 150–400)
RBC: 4.74 MIL/uL (ref 3.87–5.11)
RDW: 13.2 % (ref 11.5–15.5)
WBC: 11.9 10*3/uL — ABNORMAL HIGH (ref 4.0–10.5)
nRBC: 0 % (ref 0.0–0.2)

## 2021-04-24 LAB — URINALYSIS, ROUTINE W REFLEX MICROSCOPIC
Bacteria, UA: NONE SEEN
Glucose, UA: NEGATIVE mg/dL
Ketones, ur: 15 mg/dL — AB
Leukocytes,Ua: NEGATIVE
Nitrite: NEGATIVE
Protein, ur: NEGATIVE mg/dL
Specific Gravity, Urine: <1.005 — ABNORMAL LOW (ref 1.005–1.030)
pH: 7 (ref 5.0–8.0)

## 2021-04-24 LAB — BASIC METABOLIC PANEL
Anion gap: 10 (ref 5–15)
BUN: 9 mg/dL (ref 8–23)
CO2: 28 mmol/L (ref 22–32)
Calcium: 8 mg/dL — ABNORMAL LOW (ref 8.9–10.3)
Chloride: 94 mmol/L — ABNORMAL LOW (ref 98–111)
Creatinine, Ser: 0.59 mg/dL (ref 0.44–1.00)
GFR, Estimated: >60 mL/min (ref >60)
Glucose, Bld: 110 mg/dL — ABNORMAL HIGH (ref 70–99)
Potassium: 3.8 mmol/L (ref 3.5–5.1)
Sodium: 132 mmol/L — ABNORMAL LOW (ref 135–145)

## 2021-04-24 LAB — MAGNESIUM: Magnesium: 1.6 mg/dL — ABNORMAL LOW (ref 1.7–2.4)

## 2021-04-24 LAB — TSH: TSH: 0.666 u[IU]/mL (ref 0.350–4.500)

## 2021-04-24 MED ORDER — OXYCODONE-ACETAMINOPHEN 5-325 MG PO TABS
1.0000 | ORAL_TABLET | Freq: Once | ORAL | Status: AC
  Administered 2021-04-24: 1 via ORAL
  Filled 2021-04-24: qty 1

## 2021-04-24 MED ORDER — LORATADINE 10 MG PO TABS
10.0000 mg | ORAL_TABLET | Freq: Every day | ORAL | Status: DC
  Administered 2021-04-24 – 2021-04-30 (×7): 10 mg via ORAL
  Filled 2021-04-24 (×7): qty 1

## 2021-04-24 MED ORDER — GUAIFENESIN-DM 100-10 MG/5ML PO SYRP
5.0000 mL | ORAL_SOLUTION | ORAL | Status: DC | PRN
  Administered 2021-04-26 – 2021-04-27 (×3): 5 mL via ORAL
  Filled 2021-04-24 (×3): qty 10

## 2021-04-24 MED ORDER — SENNOSIDES-DOCUSATE SODIUM 8.6-50 MG PO TABS
1.0000 | ORAL_TABLET | Freq: Two times a day (BID) | ORAL | Status: DC
  Administered 2021-04-24 – 2021-04-29 (×9): 1 via ORAL
  Filled 2021-04-24 (×10): qty 1

## 2021-04-24 MED ORDER — ACETAMINOPHEN 325 MG PO TABS
650.0000 mg | ORAL_TABLET | Freq: Four times a day (QID) | ORAL | Status: DC | PRN
  Administered 2021-04-26 – 2021-04-30 (×5): 650 mg via ORAL
  Filled 2021-04-24 (×5): qty 2

## 2021-04-24 MED ORDER — ALBUTEROL SULFATE (2.5 MG/3ML) 0.083% IN NEBU
2.5000 mg | INHALATION_SOLUTION | Freq: Four times a day (QID) | RESPIRATORY_TRACT | Status: DC | PRN
  Administered 2021-04-27 – 2021-04-28 (×2): 2.5 mg via RESPIRATORY_TRACT
  Filled 2021-04-24 (×2): qty 3

## 2021-04-24 MED ORDER — PANTOPRAZOLE SODIUM 40 MG PO TBEC
40.0000 mg | DELAYED_RELEASE_TABLET | Freq: Every day | ORAL | Status: DC
  Administered 2021-04-24 – 2021-04-30 (×7): 40 mg via ORAL
  Filled 2021-04-24 (×7): qty 1

## 2021-04-24 MED ORDER — OXYCODONE HCL 5 MG PO TABS
2.5000 mg | ORAL_TABLET | Freq: Three times a day (TID) | ORAL | Status: DC | PRN
  Administered 2021-04-24: 2.5 mg via ORAL
  Filled 2021-04-24 (×2): qty 1

## 2021-04-24 MED ORDER — OXYCODONE-ACETAMINOPHEN 10-325 MG PO TABS: 0.5000 | ORAL_TABLET | Freq: Three times a day (TID) | ORAL | Status: DC | PRN

## 2021-04-24 MED ORDER — LEVOTHYROXINE SODIUM 25 MCG PO TABS
125.0000 ug | ORAL_TABLET | Freq: Every day | ORAL | Status: DC
  Administered 2021-04-24 – 2021-04-30 (×7): 125 ug via ORAL
  Filled 2021-04-24 (×7): qty 1

## 2021-04-24 MED ORDER — OFLOXACIN 0.3 % OP SOLN
1.0000 [drp] | Freq: Two times a day (BID) | OPHTHALMIC | Status: DC
  Administered 2021-04-24 – 2021-04-30 (×13): 1 [drp] via OPHTHALMIC
  Filled 2021-04-24 (×3): qty 5

## 2021-04-24 MED ORDER — ISOSORBIDE MONONITRATE ER 30 MG PO TB24
30.0000 mg | ORAL_TABLET | Freq: Every day | ORAL | Status: DC
  Administered 2021-04-24 – 2021-04-27 (×4): 30 mg via ORAL
  Filled 2021-04-24 (×4): qty 1

## 2021-04-24 MED ORDER — APIXABAN 5 MG PO TABS
5.0000 mg | ORAL_TABLET | Freq: Two times a day (BID) | ORAL | Status: DC
  Administered 2021-04-24 – 2021-04-30 (×14): 5 mg via ORAL
  Filled 2021-04-24 (×14): qty 1

## 2021-04-24 MED ORDER — OXYCODONE-ACETAMINOPHEN 5-325 MG PO TABS
0.5000 | ORAL_TABLET | Freq: Three times a day (TID) | ORAL | Status: DC | PRN
  Administered 2021-04-24: 0.5 via ORAL
  Filled 2021-04-24 (×3): qty 1

## 2021-04-24 MED ORDER — MAGNESIUM SULFATE 4 GM/100ML IV SOLN
4.0000 g | Freq: Once | INTRAVENOUS | Status: AC
  Administered 2021-04-24: 4 g via INTRAVENOUS
  Filled 2021-04-24: qty 100

## 2021-04-24 MED ORDER — LACTATED RINGERS IV SOLN: INTRAVENOUS | Status: DC

## 2021-04-24 MED ORDER — ONDANSETRON HCL 4 MG PO TABS
4.0000 mg | ORAL_TABLET | Freq: Four times a day (QID) | ORAL | Status: DC | PRN
  Administered 2021-04-26: 4 mg via ORAL
  Filled 2021-04-24: qty 1

## 2021-04-24 MED ORDER — LIDOCAINE 5 % EX PTCH
1.0000 | MEDICATED_PATCH | Freq: Every day | CUTANEOUS | Status: DC | PRN
  Administered 2021-04-26 – 2021-04-30 (×2): 1 via TRANSDERMAL
  Filled 2021-04-24 (×3): qty 1

## 2021-04-24 MED ORDER — FLUTICASONE PROPIONATE 50 MCG/ACT NA SUSP: 2.0000 | Freq: Every day | NASAL | Status: DC | PRN

## 2021-04-24 MED ORDER — ONDANSETRON HCL 4 MG/2ML IJ SOLN
4.0000 mg | Freq: Four times a day (QID) | INTRAMUSCULAR | Status: DC | PRN
  Administered 2021-04-24 – 2021-04-28 (×3): 4 mg via INTRAVENOUS
  Filled 2021-04-24 (×3): qty 2

## 2021-04-24 MED ORDER — ALBUTEROL SULFATE HFA 108 (90 BASE) MCG/ACT IN AERS: 2.0000 | INHALATION_SPRAY | Freq: Four times a day (QID) | RESPIRATORY_TRACT | Status: DC | PRN

## 2021-04-24 MED ORDER — POTASSIUM CHLORIDE ER 10 MEQ PO TBCR
10.0000 meq | EXTENDED_RELEASE_TABLET | Freq: Every day | ORAL | Status: DC
  Administered 2021-04-24: 10 meq via ORAL
  Filled 2021-04-24 (×3): qty 1

## 2021-04-24 MED ORDER — ACETAMINOPHEN 650 MG RE SUPP: 650.0000 mg | Freq: Four times a day (QID) | RECTAL | Status: DC | PRN

## 2021-04-24 MED ORDER — POLYETHYLENE GLYCOL 3350 17 G PO PACK
17.0000 g | PACK | Freq: Every day | ORAL | Status: DC
  Administered 2021-04-25 – 2021-04-29 (×3): 17 g via ORAL
  Filled 2021-04-24 (×6): qty 1

## 2021-04-24 NOTE — Assessment & Plan Note (Signed)
Continue eliquis  ?

## 2021-04-24 NOTE — Assessment & Plan Note (Signed)
Continue synthroid.

## 2021-04-24 NOTE — Assessment & Plan Note (Addendum)
Replaced. °

## 2021-04-24 NOTE — Assessment & Plan Note (Addendum)
Continue to hold ACEi-HCTZ given dehydration and hypokalemia.  Continue with Imdur.

## 2021-04-24 NOTE — ED Notes (Signed)
Pt care taken, resting at this moment

## 2021-04-24 NOTE — Progress Notes (Signed)
I have seen and assessed patient and agree with Dr. Juleen China assessment and plan. Patient is a 88 year old female history of chronic back pain on chronic pain medications, type 2 diabetes, hypertension presented to the ED with complaints of abdominal pain, dysuria for the past couple of days.  Patient noted with poor mobility at home noted to be dehydrated and noted not to have been ambulatory since recent admission for pneumonia.  Patient seen in the ED, noted to be dehydrated, with electrolyte abnormalities.  Electrolytes were replaced, patient placed on IV fluids.  PT OT ordered.  No charge.

## 2021-04-24 NOTE — Evaluation (Signed)
Physical Therapy Evaluation Patient Details Name: Angela [name] MRN: [id number] DOB: [birth year] Today's Date: 04/24/2021  History of Present Illness  Pt admitted from home with c/o Abdominal pain, and limited mobility and dx with dehydration.  Pt with hx of DM, breast CA s/p L mastectomy, chronic back pain and recent hospital stay 2* PNA.  Clinical Impression  Pt admitted as above and presenting with functional mobility limitations 2* generalized weakness, balance deficits, and very limited endurance.  Pt currently requiring assist for safe performance of all basic mobility tasks and would benefit from follow up rehab at SNF level to maximize IND and safety prior to return home with limited assist.     Recommendations for follow up therapy are one component of a multi-disciplinary discharge planning process, led by the attending physician.  Recommendations may be updated based on patient status, additional functional criteria and insurance authorization.  Follow Up Recommendations Skilled nursing-short term rehab (<3 hours/day)    Assistance Recommended at Discharge Frequent or constant Supervision/Assistance  Patient can return home with the following  A lot of help with walking and/or transfers;A lot of help with bathing/dressing/bathroom;Assistance with cooking/housework;Assist for transportation;Help with stairs or ramp for entrance    Equipment Recommendations None recommended by PT  Recommendations for Other Services       Functional Status Assessment Patient has had a recent decline in their functional status and demonstrates the ability to make significant improvements in function in a reasonable and predictable amount of time.     Precautions / Restrictions Precautions Precautions: Fall Restrictions Weight Bearing Restrictions: No      Mobility  Bed Mobility Overal bed mobility: Needs Assistance Bed Mobility: Supine to Sit, Sit to Supine     Supine to sit: Min  assist, +2 for physical assistance, +2 for safety/equipment, HOB elevated Sit to supine: Min assist, Mod assist, +2 for safety/equipment, +2 for physical assistance   General bed mobility comments: increased time with use of bedrail and assist to manage LEs and control trunk    Transfers                   General transfer comment: Pt unwilling to attempt further than EOB sitting 2* fatigue    Ambulation/Gait                  Stairs            Wheelchair Mobility    Modified Rankin (Stroke Patients Only)       Balance Overall balance assessment: Needs assistance Sitting-balance support: No upper extremity supported, Feet unsupported Sitting balance-Leahy Scale: Good                                       Pertinent Vitals/Pain Pain Assessment Pain Assessment: Faces Faces Pain Scale: Hurts little more Pain Location: R LE tender to touch Pain Descriptors / Indicators: Grimacing, Sore Pain Intervention(s): Limited activity within patient's tolerance, Monitored during session    Home Living Family/patient expects to be discharged to:: Private residence Living Arrangements: Alone Available Help at Discharge: Family;Home health Type of Home: House Home Access: Other (comment) (chair lift)       Home Layout: Two level;Bed/bath upstairs;Other (Comment) (chair lift) Home Equipment: Rolling Walker (2 wheels);Rollator (4 wheels);BSC/3in1 Additional Comments: has stair chair lift    Prior Function Prior Level of Function : Needs assist  Mobility Comments: Pt states limited ambulation when help is with her but otherwise bed to [facility] when alone. Has a HHRN, Nadine ADLs Comments: assist with LB ADLs and meal prep, uses BSC when PA is not with her     Hand Dominance        Extremity/Trunk Assessment   Upper Extremity Assessment Upper Extremity Assessment: Defer to OT evaluation    Lower Extremity Assessment Lower  Extremity Assessment: Generalized weakness;RLE deficits/detail;LLE deficits/detail RLE Deficits / Details: 3/5 strength with knee ROM ltd to ~75 flex LLE Deficits / Details: 3+/5 strength with knee ROM ltd to ~ 90 flex       Communication   Communication: No difficulties  Cognition Arousal/Alertness: Awake/alert Behavior During Therapy: WFL for tasks assessed/performed Overall Cognitive Status: Within Functional Limits for tasks assessed                                          General Comments      Exercises     Assessment/Plan    PT Assessment Patient needs continued PT services  PT Problem List Decreased strength;Decreased range of motion;Decreased activity tolerance;Decreased balance;Decreased mobility;Decreased knowledge of use of DME;Pain;Obesity       PT Treatment Interventions DME instruction;Gait training;Stair training;Functional mobility training;Therapeutic activities;Therapeutic exercise;Balance training;Patient/family education    PT Goals (Current goals can be found in the Care Plan section)  Acute Rehab PT Goals Patient Stated Goal: Regain Strength and go home - willing to consider rehab stay first PT Goal Formulation: With patient Time For Goal Achievement: 05/08/21 Potential to Achieve Goals: Good    Frequency Min 3X/week     Co-evaluation PT/OT/SLP Co-Evaluation/Treatment: Yes Reason for Co-Treatment: For patient/therapist safety;To address functional/ADL transfers PT goals addressed during session: Mobility/safety with mobility OT goals addressed during session: ADL's and self-care       AM-PAC PT "6 Clicks" Mobility  Outcome Measure Help needed turning from your back to your side while in a flat bed without using bedrails?: A Little Help needed moving from lying on your back to sitting on the side of a flat bed without using bedrails?: A Lot Help needed moving to and from a bed to a chair (including a wheelchair)?: A Lot Help  needed standing up from a chair using your arms (e.g., wheelchair or bedside chair)?: A Lot Help needed to walk in hospital room?: Total Help needed climbing 3-5 steps with a railing? : Total 6 Click Score: 11    End of Session   Activity Tolerance: Patient limited by fatigue Patient left: in bed;with call bell/phone within reach Nurse Communication: Mobility status PT Visit Diagnosis: Muscle weakness (generalized) (M62.81);Difficulty in walking, not elsewhere classified (R26.2)    Time: 8119-1478 PT Time Calculation (min) (ACUTE ONLY): 16 min   Charges:   PT Evaluation $PT Eval Low Complexity: 1 Low          [place] Pager [phone] Office [phone]   Zaide Mcclenahan 04/24/2021, 10:42 AM

## 2021-04-24 NOTE — Assessment & Plan Note (Addendum)
Continue Oxycodone PRN.

## 2021-04-24 NOTE — ED Notes (Signed)
Breakfast tray given. °

## 2021-04-24 NOTE — Progress Notes (Signed)
Occupational Therapy Evaluation  Patient lives home alone and reports since hospitalization beginning of January has not been ambulatory except with HH therapies. Patient has been transferring to bedside commode when home alone. Currently patient needing min A +2 to sit up to edge of bed, states "I'm not standing, I've had every test done to me since I've been here." Patient demonstrate good sitting balance for ~5 minutes before fatigued and starts laying herself back into bed needing min/mod A +2 to complete. Due to decreased endurance, strength recommend continued acute OT services to maximize functional independence. Patient reports she is thinking about going to rehab, educated on benefits of this plan as she has limited assistance at home and increased difficulty with transfers making it difficult to care for herself.    04/24/21 1300  OT Visit Information  Last OT Received On 04/24/21  Assistance Needed +2  PT/OT/SLP Co-Evaluation/Treatment Yes  Reason for Co-Treatment For patient/therapist safety;To address functional/ADL transfers  PT goals addressed during session Mobility/safety with mobility  OT goals addressed during session ADL's and self-care  History of Present Illness Pt admitted from home with c/o Abdominal pain, and limited mobility and dx with dehydration.  Pt with hx of DM, breast CA s/p L mastectomy, chronic back pain and recent hospital stay 2* PNA.  Precautions  Precautions Fall  Restrictions  Weight Bearing Restrictions No  Home Living  Family/patient expects to be discharged to: Private residence  Living Arrangements Alone  Available Help at Discharge Family;Home health  Type of [place] Access Other (comment) (chair lift)  Home Layout Two level;Bed/bath upstairs;Other (Comment) (chair lift)  Home Equipment Rolling Walker (2 wheels);Rollator (4 wheels);BSC/3in1  Additional Comments has stair chair lift  Prior Function  Prior Level of Function  Needs  assist  Mobility Comments Pt states limited ambulation when help is with her but otherwise bed to [facility] when alone. Has a HHRN, Nadine  ADLs Comments assist with LB ADLs and meal prep, uses BSC when PA is not with her  Communication  Communication No difficulties  Pain Assessment  Pain Assessment Faces  Faces Pain Scale 4  Pain Location R LE tender to touch  Pain Descriptors / Indicators Grimacing;Sore  Pain Intervention(s) Monitored during session  Cognition  Arousal/Alertness Awake/alert  Behavior During Therapy WFL for tasks assessed/performed  Overall Cognitive Status Within Functional Limits for tasks assessed  Upper Extremity Assessment  Upper Extremity Assessment LUE deficits/detail;RUE deficits/detail;Generalized weakness  RUE Deficits / Details limited shoulder flexion ~100 degrees  LUE Deficits / Details Limited shoulder flexion ~90 degrees "my ligaments are all gone from my mastectomy  Lower Extremity Assessment  Lower Extremity Assessment Defer to PT evaluation  ADL  Overall ADL's  Needs assistance/impaired  Eating/Feeding Independent;Bed level  Grooming Set up;Sitting;Bed level  Upper Body Bathing Min guard;Sitting  Lower Body Bathing Maximal assistance;Sitting/lateral leans  Upper Body Dressing  Minimal assistance;Sitting  Lower Body Dressing Total assistance;Sitting/lateral leans  Toilet Transfer Details (indicate cue type and reason) Deferred patient refusing to stand "I've been through every test you can imagine"  Toileting- Clothing Manipulation and Hygiene Total assistance;Bed level  General ADL Comments PAtient needing increased assistance with self care tasks due to weakness, decreased endurance  Bed Mobility  Overal bed mobility Needs Assistance  Bed Mobility Supine to Sit;Sit to Supine  Supine to sit Min assist;+2 for physical assistance;+2 for safety/equipment;HOB elevated  Sit to supine Min assist;Mod assist;+2 for safety/equipment;+2 for physical assistance   General bed mobility comments  increased time with use of bedrail and assist to manage LEs and control trunk  Transfers  General transfer comment Pt unwilling to attempt further than EOB sitting 2* fatigue  Balance  Overall balance assessment Needs assistance  Sitting-balance support No upper extremity supported;Feet unsupported  Sitting balance-Leahy Scale Good  OT - End of Session  Activity Tolerance Patient limited by fatigue  Patient left in bed;with call bell/phone within reach  Nurse Communication Mobility status  OT Assessment  OT Recommendation/Assessment Patient needs continued OT Services  OT Visit Diagnosis Other abnormalities of gait and mobility (R26.89);Muscle weakness (generalized) (M62.81)  OT Problem List Decreased strength;Decreased range of motion;Decreased activity tolerance;Impaired balance (sitting and/or standing);Obesity;Decreased safety awareness;Decreased knowledge of use of DME or AE  OT Plan  OT Frequency (ACUTE ONLY) Min 2X/week  OT Treatment/Interventions (ACUTE ONLY) Self-care/ADL training;Therapeutic exercise;Balance training;Patient/family education;Therapeutic activities;DME and/or AE instruction  AM-PAC OT "6 Clicks" Daily Activity Outcome Measure (Version 2)  Help from another person eating meals? 4  Help from another person taking care of personal grooming? 3  Help from another person toileting, which includes using toliet, bedpan, or urinal? 1  Help from another person bathing (including washing, rinsing, drying)? 2  Help from another person to put on and taking off regular upper body clothing? 3  Help from another person to put on and taking off regular lower body clothing? 1  6 Click Score 14  Progressive Mobility  What is the highest level of mobility based on the progressive mobility assessment? Level 2 (Chairfast) - Balance while sitting on edge of bed and cannot stand  Activity Dangled on edge of bed  OT Recommendation  Follow Up  Recommendations Skilled nursing-short term rehab (<3 hours/day)  Assistance recommended at discharge Frequent or constant Supervision/Assistance  Patient can return home with the following Two people to help with walking and/or transfers;A lot of help with bathing/dressing/bathroom;Help with stairs or ramp for entrance  Functional Status Assessent Patient has had a recent decline in their functional status and demonstrates the ability to make significant improvements in function in a reasonable and predictable amount of time.  OT Equipment None recommended by OT  Individuals Consulted  Consulted and Agree with Results and Recommendations Patient  Acute Rehab OT Goals  Patient Stated Goal "I might go to rehab"  OT Goal Formulation With patient  Time For Goal Achievement 05/08/21  Potential to Achieve Goals Good  OT Time Calculation  OT Start Time (ACUTE ONLY) 0946  OT Stop Time (ACUTE ONLY) 1004  OT Time Calculation (min) 18 min  OT General Charges  $OT Visit 1 Visit  OT Evaluation  $OT Eval Low Complexity 1 Low  Written Expression  Dominant Hand  (did not specify)   Delbert Phenix OT OT pager: [phone]

## 2021-04-24 NOTE — Assessment & Plan Note (Addendum)
Replete with 40 meq extra today. She will be discharge on 40 meq daily. Needs Bmet to follow K level.

## 2021-04-24 NOTE — Assessment & Plan Note (Addendum)
Worse since PNA admit last month. PT/OT recommend SNF>  Patient agrees to go to rehab,.

## 2021-04-24 NOTE — H&P (Addendum)
History and Physical    Patient: Angela [name] PQZ:[id number] DOB: Aug 11, 1932 DOA: 04/23/2021 DOS: the patient was seen and examined on 04/24/2021 PCP: [name], MD  Patient coming from: Home  Chief Complaint:  Chief Complaint  Patient presents with   Abdominal Pain    HPI: Angela [name] is a 88 y.o. female with medical history significant of chronic back pain on percocet, DM2, HTN.  Pt presents to ED with c/o abd pain and dysuria for past couple of days.  Poor mobility at home with dehydration over past couple of days.  Past month really per patient ever since the admission last month for PNA she hasnt really been ambulatory.  She has home health coming in 3 days a week, but basically on non HH days she remains on couch.   Review of Systems: As mentioned in the history of present illness. All other systems reviewed and are negative. Past Medical History:  Diagnosis Date   Arthritis    BRCA2 positive 10/214   Breast cancer ([place]) 1994   left sided cancer; unilateral mastectomy, no chemo or radiation   Diabetes mellitus    Dyslipidemia    GERD (gastroesophageal reflux disease)    Heart murmur    Hypertension    Past Surgical History:  Procedure Laterality Date   CARDIAC CATHETERIZATION     NORMAL CORONARY ARTERIES   CHOLECYSTECTOMY     MASTECTOMY     LEFT BREAST   TONSILLECTOMY     Social History:  reports that she has never smoked. She has never used smokeless tobacco. She reports that she does not drink alcohol and does not use drugs.  Allergies  Allergen Reactions   Crestor [Rosuvastatin Calcium] Anaphylaxis and Other (See Comments)    Abdominal discomfort   Gadolinium Nausea And Vomiting     pt states she had nausea after receiving MAGNEVIST for breast imaging    Pravastatin Other (See Comments)    Locked jaw, weakness   Simvastatin Other (See Comments)    Weakness and locked jaw.   Penicillins Swelling and Rash    Patient tolerates  amoxicillin  Has patient had a PCN reaction causing immediate rash, facial/tongue/throat swelling, SOB or lightheadedness with hypotension: yes Has patient had a PCN reaction causing severe rash involving mucus membranes or skin necrosis: unknown Has patient had a PCN reaction that required hospitalization: unknown Has patient had a PCN reaction occurring within the last 10 years: no If all of the above answers are "NO", then may proceed with Cephalosporin use.     Family History  Problem Relation Age of Onset   Heart attack Maternal Grandmother    Pancreatic cancer Mother 71   Bladder Cancer Father 35       heavy smoker and drinker   Ovarian cancer Sister 29   Hypertension Sister    Lung cancer Brother 13   Ovarian cancer Maternal Aunt 58   Stomach cancer Maternal Grandfather    Bone cancer Paternal Grandmother        unsure if this was a primary cancer   Stomach cancer Paternal Grandfather    Lung cancer Maternal Aunt        died in her 2s; former smoker   Breast cancer Cousin        dx in her late 58s to 41s; paternal cousin   Heart disease Neg Hx     Prior to Admission medications   Medication Sig Start Date End Date  Taking? Authorizing Provider  apixaban (ELIQUIS) 5 MG TABS tablet Take 1 tablet (5 mg total) by mouth 2 (two) times daily. 12/29/20  Yes [name], MD  benazepril-hydrochlorthiazide (LOTENSIN HCT) 20-12.5 MG tablet Take 1 tablet by mouth daily. 12/29/20  Yes [name], MD  diclofenac Sodium (VOLTAREN) 1 % GEL Apply 2 g topically 4 (four) times daily as needed (knee pain).   Yes [provider]  fluticasone (FLONASE) 50 MCG/ACT nasal spray Place 2 sprays into both nostrils daily. Patient taking differently: Place 2 sprays into both nostrils daily as needed for allergies. 03/29/21  Yes Eugenie Filler, MD  isosorbide mononitrate (IMDUR) 30 MG 24 hr tablet Take 1 tablet (30 mg total) by mouth daily. 12/29/20  Yes [name], MD   levothyroxine (SYNTHROID) 125 MCG tablet Take 1 tablet (125 mcg total) by mouth daily. 01/13/21  Yes [name], MD  lidocaine (LIDODERM) 5 % Place 1 patch onto the skin daily as needed for pain. 02/26/21  Yes [provider]  loratadine (CLARITIN) 10 MG tablet Take 1 tablet (10 mg total) by mouth daily. 03/29/21  Yes Eugenie Filler, MD  ofloxacin (OCUFLOX) 0.3 % ophthalmic solution Place 1 drop into the right eye 2 (two) times daily.   Yes [provider]  ondansetron (ZOFRAN) 4 MG tablet Take 1 tablet (4 mg total) by mouth every 6 (six) hours as needed for nausea. Patient taking differently: Take 4 mg by mouth every 6 (six) hours as needed for nausea or vomiting. 03/28/21  Yes Eugenie Filler, MD  pantoprazole (PROTONIX) 40 MG tablet Take 1 tablet (40 mg total) by mouth daily. 12/30/20  Yes [name], MD  PERCOCET 10-325 MG tablet Take 0.5 tablets by mouth every 8 (eight) hours as needed for pain. 02/26/21  Yes [provider]  polyethylene glycol powder (GLYCOLAX/MIRALAX) 17 GM/SCOOP powder DISSOLVE ONE CAPFUL (17 GM) IN LIQUID AND DRINK THREE TIMES DAILY AS NEEDED FOR MODERATE CONSTIPATION Patient taking differently: Take 1 Container by mouth daily. DISSOLVE ONE CAPFUL (17 GM) IN LIQUID AND DRINK THREE TIMES DAILY AS NEEDED FOR MODERATE CONSTIPATION 03/16/21  Yes [name], MD  potassium chloride (KLOR-CON) 10 MEQ tablet TAKE 1 TABLET ONCE DAILY. Patient taking differently: Take 10 mEq by mouth daily. 12/18/20  Yes [name], MD  senna-docusate (SENOKOT-S) 8.6-50 MG tablet Take 1 tablet by mouth 2 (two) times daily. 03/28/21  Yes Eugenie Filler, MD  sodium chloride (OCEAN) 0.65 % SOLN nasal spray Place 1 spray into both nostrils daily.   Yes [provider]  VENTOLIN HFA 108 (90 Base) MCG/ACT inhaler Inhale 2 puffs into the lungs 4 (four) times daily as needed for wheezing or shortness of breath. Use 2 puffs 3 times a day x4 days, then 4 times  a day as needed. Patient taking differently: Inhale 2 puffs into the lungs in the morning, at noon, in the evening, and at bedtime. 03/28/21  Yes Eugenie Filler, MD  Vitamin D, Ergocalciferol, (DRISDOL) 1.25 MG (50000 UNIT) CAPS capsule TAKE ONE CAPSULE ONCE A WEEK Patient taking differently: Take 50,000 Units by mouth every 7 (seven) days. Fridays 03/16/21  Yes [name], MD  alendronate (FOSAMAX) 70 MG tablet TAKE 1 TAB ONCE A WEEK, AT LEAST 30 MIN BEFORE 1ST FOOD.DO NOT LIE DOWN FOR 30 MIN AFTER TAKING. Patient not taking: Reported on 01/13/2021 12/18/20   [name], MD    Physical Exam: Vitals:  04/23/21 2015 04/23/21 2115 04/23/21 2200 04/23/21 2300  BP: 113/62 (!) 160/82 140/78 (!) 131/57  Pulse: 87 85 89 87  Resp:  18  18  Temp:      TempSrc:      SpO2: 96% 97% 96% 95%   Constitutional: NAD, calm, comfortable Eyes: PERRL, lids and conjunctivae normal ENMT: Mucous membranes are moist. Posterior pharynx clear of any exudate or lesions.Normal dentition.  Neck: normal, supple, no masses, no thyromegaly Respiratory: clear to auscultation bilaterally, no wheezing, no crackles. Normal respiratory effort. No accessory muscle use.  Cardiovascular: Regular rate and rhythm, no murmurs / rubs / gallops. No extremity edema. 2+ pedal pulses. No carotid bruits.  Abdomen: no tenderness, no masses palpated. No hepatosplenomegaly. Bowel sounds positive.  Musculoskeletal: no clubbing / cyanosis. No joint deformity upper and lower extremities. Good ROM, no contractures. Normal muscle tone.  Skin: no rashes, lesions, ulcers. No induration Neurologic: CN 2-12 grossly intact. Sensation intact, DTR normal. Strength 5/5 in all 4.  Psychiatric: Normal judgment and insight. Alert and oriented x 3. Normal mood.    Data Reviewed:  CT AP without acute findings, just age indeterminate compression fx K 2.9 and Mg of 1.1   Assessment and Plan: * Dehydration- (present on admission) In setting  of immobility IVF Repeat BMP in AM.  Immobility- (present on admission) Worse since PNA admit last month. Getting PT/OT evaluation. She wants to go home but not sure its safe for her to be there alone.  Will see what PT/OT recommend.  Chronic back pain- (present on admission) Continue percocet  History of pulmonary embolus (PE)- (present on admission) Continue eliquis.  Hypomagnesemia- (present on admission) Replace Mg and repeat in AM  Hypokalemia- (present on admission) Replace K, repeat in AM  Hypothyroidism (acquired)- (present on admission) Continue synthroid.  Hypertension- (present on admission) Hold ACEi-HCTZ given dehydration. Continue imdur.       Advance Care Planning:   Code Status: Full Code  Consults: None  Family Communication: No family in room  Severity of Illness: The appropriate patient status for this patient is OBSERVATION. Observation status is judged to be reasonable and necessary in order to provide the required intensity of service to ensure the patient's safety. The patient's presenting symptoms, physical exam findings, and initial radiographic and laboratory data in the context of their medical condition is felt to place them at decreased risk for further clinical deterioration. Furthermore, it is anticipated that the patient will be medically stable for discharge from the hospital within 2 midnights of admission.   Author: Etta Quill., DO 04/24/2021 12:51 AM  For on call review [email or web address].

## 2021-04-24 NOTE — Assessment & Plan Note (Addendum)
In setting of immobility, poor oral intake.  Received IV fluids.  Improved.

## 2021-04-25 DIAGNOSIS — Z86711 Personal history of pulmonary embolism: Secondary | ICD-10-CM | POA: Diagnosis not present

## 2021-04-25 DIAGNOSIS — E876 Hypokalemia: Secondary | ICD-10-CM | POA: Diagnosis not present

## 2021-04-25 DIAGNOSIS — I10 Essential (primary) hypertension: Secondary | ICD-10-CM | POA: Diagnosis not present

## 2021-04-25 DIAGNOSIS — K219 Gastro-esophageal reflux disease without esophagitis: Secondary | ICD-10-CM

## 2021-04-25 DIAGNOSIS — E860 Dehydration: Secondary | ICD-10-CM | POA: Diagnosis not present

## 2021-04-25 LAB — CBC WITH DIFFERENTIAL/PLATELET
Abs Immature Granulocytes: 0.07 10*3/uL (ref 0.00–0.07)
Basophils Absolute: 0.1 10*3/uL (ref 0.0–0.1)
Basophils Relative: 1 %
Eosinophils Absolute: 0.4 10*3/uL (ref 0.0–0.5)
Eosinophils Relative: 4 %
HCT: 40.3 % (ref 36.0–46.0)
Hemoglobin: 13.5 g/dL (ref 12.0–15.0)
Immature Granulocytes: 1 %
Lymphocytes Relative: 18 %
Lymphs Abs: 1.7 10*3/uL (ref 0.7–4.0)
MCH: 30.9 pg (ref 26.0–34.0)
MCHC: 33.5 g/dL (ref 30.0–36.0)
MCV: 92.2 fL (ref 80.0–100.0)
Monocytes Absolute: 1 10*3/uL (ref 0.1–1.0)
Monocytes Relative: 11 %
Neutro Abs: 6.4 10*3/uL (ref 1.7–7.7)
Neutrophils Relative %: 65 %
Platelets: 225 10*3/uL (ref 150–400)
RBC: 4.37 MIL/uL (ref 3.87–5.11)
RDW: 13.2 % (ref 11.5–15.5)
WBC: 9.7 10*3/uL (ref 4.0–10.5)
nRBC: 0 % (ref 0.0–0.2)

## 2021-04-25 LAB — BASIC METABOLIC PANEL
Anion gap: 7 (ref 5–15)
BUN: 6 mg/dL — ABNORMAL LOW (ref 8–23)
CO2: 28 mmol/L (ref 22–32)
Calcium: 7.9 mg/dL — ABNORMAL LOW (ref 8.9–10.3)
Chloride: 95 mmol/L — ABNORMAL LOW (ref 98–111)
Creatinine, Ser: 0.43 mg/dL — ABNORMAL LOW (ref 0.44–1.00)
GFR, Estimated: >60 mL/min (ref >60)
Glucose, Bld: 94 mg/dL (ref 70–99)
Potassium: 3.3 mmol/L — ABNORMAL LOW (ref 3.5–5.1)
Sodium: 130 mmol/L — ABNORMAL LOW (ref 135–145)

## 2021-04-25 LAB — MAGNESIUM: Magnesium: 1.7 mg/dL (ref 1.7–2.4)

## 2021-04-25 MED ORDER — OXYCODONE HCL 5 MG PO TABS
5.0000 mg | ORAL_TABLET | Freq: Three times a day (TID) | ORAL | Status: DC | PRN
  Administered 2021-04-25 – 2021-04-27 (×6): 5 mg via ORAL
  Filled 2021-04-25 (×6): qty 1

## 2021-04-25 MED ORDER — ACETAMINOPHEN 325 MG PO TABS
162.0000 mg | ORAL_TABLET | Freq: Three times a day (TID) | ORAL | Status: DC | PRN
  Administered 2021-04-25 – 2021-04-29 (×6): 162 mg via ORAL
  Filled 2021-04-25 (×6): qty 1

## 2021-04-25 MED ORDER — MELATONIN 5 MG PO TABS
5.0000 mg | ORAL_TABLET | Freq: Once | ORAL | Status: AC
  Administered 2021-04-25: 5 mg via ORAL
  Filled 2021-04-25: qty 1

## 2021-04-25 MED ORDER — POTASSIUM CHLORIDE ER 10 MEQ PO TBCR
10.0000 meq | EXTENDED_RELEASE_TABLET | Freq: Every day | ORAL | Status: DC
  Filled 2021-04-25: qty 1

## 2021-04-25 MED ORDER — MAGNESIUM SULFATE 4 GM/100ML IV SOLN
4.0000 g | Freq: Once | INTRAVENOUS | Status: AC
  Administered 2021-04-25: 4 g via INTRAVENOUS
  Filled 2021-04-25: qty 100

## 2021-04-25 MED ORDER — POTASSIUM CHLORIDE CRYS ER 20 MEQ PO TBCR
40.0000 meq | EXTENDED_RELEASE_TABLET | Freq: Once | ORAL | Status: AC
Start: 2021-04-25 — End: 2021-04-25
  Administered 2021-04-25: 40 meq via ORAL
  Filled 2021-04-25: qty 2

## 2021-04-25 NOTE — Progress Notes (Addendum)
PROGRESS NOTE    Angela [name]  RDE:[id number] DOB: 01-11-1933 DOA: 04/23/2021 PCP: [name], MD    Chief Complaint  Patient presents with   Abdominal Pain    Brief Narrative:  Patient is 88 year old female history of chronic back pain on Percocet, type 2 diabetes, hypertension presented to the ED with abdominal pain and dysuria, poor mobility and dehydration.  Patient noted to have recently been hospitalized for pneumonia last Monday and since then had not really been ambulatory and being seen at home by home health.  Patient seen in the ED, CT abdomen and pelvis with no acute findings, age-indeterminate compression fractures noted, patient noted to be hypokalemic with a potassium of 2.9, hypomagnesemic with a potassium of 1.1.  Patient admitted placed on IV fluids, repletion of electrolytes, PT OT assessed patient and recommending SNF.   Assessment & Plan:   Principal Problem:   Dehydration Active Problems:   Hypertension   Hypothyroidism (acquired)   Hypokalemia   Hypomagnesemia   History of pulmonary embolus (PE)   Chronic back pain   Immobility  #1 dehydration -In the setting of immobility. -Patient hydrated with IV fluids. -Saline lock IV fluids. -Follow.  2.  Immobility -Noted throbbing worsens since discharge from hospitalization last month. -PT/OT recommending SNF placement. -TOC consulted for SNF placement.  3.  Chronic back pain -Continue home regimen Percocet.  4.  History of PE -Patient with no overt bleeding. -Continue Eliquis for anticoagulation.  5.  Hypomagnesemia/hypokalemia, POA -Potassium at 3.3 this morning, magnesium at 1.7. -Magnesium sulfate 4 g IV x1. -KCl 40 mEq p.o. x1.  6.  Hypothyroidism -Continue Synthroid.  7.  GERD -PPI.  8.  Hyponatremia  -Saline lock IV fluids. -Repeat labs in AM.    DVT prophylaxis: Eliquis Code Status: Full Family Communication: Updated patient.  No family at bedside. Disposition:    Status is: Inpatient Remains inpatient appropriate because: Severity of illness           Consultants:  None  Procedures:  CT abdomen pelvis 04/23/2021 Chest x-ray 04/23/2021   Antimicrobials:  None   Subjective: Patient laying in bed.  States does not feel too well.  States improvement with fatigue.  No chest pain.  No shortness of breath.  No abdominal pain.  Denies any dysuria.  Seems agreeable to SNF.  Objective: Vitals:   04/24/21 2152 04/25/21 0135 04/25/21 0621 04/25/21 1020  BP: (!) 142/71 (!) 143/60 (!) 146/64 (!) 126/55  Pulse: 90 97 98 85  Resp: 17 17 17 18   Temp: 98 F (36.7 C) 98.5 F (36.9 C) 97.9 F (36.6 C) 98.5 F (36.9 C)  TempSrc: Oral Oral Oral Oral  SpO2: 98% 93% 93% 93%  Weight:      Height:       No intake or output data in the 24 hours ending 04/25/21 1032 Filed Weights   04/24/21 1153  Weight: 113.4 kg    Examination:  General exam: Appears calm and comfortable  Respiratory system: Clear to auscultation. Respiratory effort normal. Cardiovascular system: S1 & S2 heard, RRR. No JVD, murmurs, rubs, gallops or clicks. No pedal edema. Gastrointestinal system: Abdomen is nondistended, soft and nontender. No organomegaly or masses felt. Normal bowel sounds heard. Central nervous system: Alert and oriented. No focal neurological deficits. Extremities: Symmetric 5 x 5 power. Skin: No rashes, lesions or ulcers Psychiatry: Judgement and insight appear normal. Mood & affect appropriate.     Data Reviewed: I have personally reviewed following  labs and imaging studies  CBC: Recent Labs  Lab 04/23/21 1633 04/24/21 0420 04/25/21 0758  WBC 14.4* 11.9* 9.7  NEUTROABS 10.0*  --  6.4  HGB 14.1 14.3 13.5  HCT 42.1 44.0 40.3  MCV 92.1 92.8 92.2  PLT 243 239 308    Basic Metabolic Panel: Recent Labs  Lab 04/23/21 1633 04/24/21 0420 04/25/21 0758  NA 133* 132* 130*  K 2.9* 3.8 3.3*  CL 94* 94* 95*  CO2 31 28 28   GLUCOSE 113* 110*  94  BUN 9 9 6*  CREATININE 0.57 0.59 0.43*  CALCIUM 7.9* 8.0* 7.9*  MG 1.1* 1.6* 1.7    GFR: Estimated Creatinine Clearance: 66.7 mL/min (A) (by C-G formula based on SCr of 0.43 mg/dL (L)).  Liver Function Tests: Recent Labs  Lab 04/23/21 1633  AST 22  ALT 14  ALKPHOS 114  BILITOT 1.1  PROT 5.5*  ALBUMIN 2.7*    CBG: Recent Labs  Lab 04/23/21 2136  GLUCAP 95     Recent Results (from the past 240 hour(s))  Resp Panel by RT-PCR (Flu A&B, Covid) Nasopharyngeal Swab     Status: None   Collection Time: 04/23/21  5:55 PM   Specimen: Nasopharyngeal Swab; Nasopharyngeal(NP) swabs in vial transport medium  Result Value Ref Range Status   SARS Coronavirus 2 by RT PCR NEGATIVE NEGATIVE Final    Comment: (NOTE) SARS-CoV-2 target nucleic acids are NOT DETECTED.  The SARS-CoV-2 RNA is generally detectable in upper respiratory specimens during the acute phase of infection. The lowest concentration of SARS-CoV-2 viral copies this assay can detect is 138 copies/mL. A negative result does not preclude SARS-Cov-2 infection and should not be used as the sole basis for treatment or other patient management decisions. A negative result may occur with  improper specimen collection/handling, submission of specimen other than nasopharyngeal swab, presence of viral mutation(s) within the areas targeted by this assay, and inadequate number of viral copies(<138 copies/mL). A negative result must be combined with clinical observations, patient history, and epidemiological information. The expected result is Negative.  Fact Sheet for Patients:  EntrepreneurPulse.com.au  Fact Sheet for Healthcare Providers:  IncredibleEmployment.be  This test is no t yet approved or cleared by the [place] FDA and  has been authorized for detection and/or diagnosis of SARS-CoV-2 by FDA under an Emergency Use Authorization (EUA). This EUA will remain  in effect  (meaning this test can be used) for the duration of the COVID-19 declaration under Section 564(b)(1) of the Act, 21 U.S.C.section 360bbb-3(b)(1), unless the authorization is terminated  or revoked sooner.       Influenza A by PCR NEGATIVE NEGATIVE Final   Influenza B by PCR NEGATIVE NEGATIVE Final    Comment: (NOTE) The Xpert Xpress SARS-CoV-2/FLU/RSV plus assay is intended as an aid in the diagnosis of influenza from Nasopharyngeal swab specimens and should not be used as a sole basis for treatment. Nasal washings and aspirates are unacceptable for Xpert Xpress SARS-CoV-2/FLU/RSV testing.  Fact Sheet for Patients: EntrepreneurPulse.com.au  Fact Sheet for Healthcare Providers: IncredibleEmployment.be  This test is not yet approved or cleared by the [place] FDA and has been authorized for detection and/or diagnosis of SARS-CoV-2 by FDA under an Emergency Use Authorization (EUA). This EUA will remain in effect (meaning this test can be used) for the duration of the COVID-19 declaration under Section 564(b)(1) of the Act, 21 U.S.C. section 360bbb-3(b)(1), unless the authorization is terminated or revoked.  Performed at [facility],  [place] [address], [place], [place] [address]   Urine Culture     Status: Abnormal (Preliminary result)   Collection Time: 04/24/21  9:04 AM   Specimen: Urine, Catheterized  Result Value Ref Range Status   Specimen Description   Final    URINE, CATHETERIZED Performed at [facility], [place] [address], [place], [place] [address]    Special Requests   Final    NONE Performed at [facility], [place] [address] [place], [place] [address]    Culture (A)  Final    40,000 COLONIES/mL ENTEROCOCCUS FAECIUM SUSCEPTIBILITIES TO FOLLOW Performed at [facility], [place] [address], [place], [place] [address]    Report Status PENDING  Incomplete          Radiology Studies: DG Chest 2 View  Result Date: 04/23/2021 CLINICAL DATA:  Malaise. Lower abdominal pain. Painful urination and diarrhea. EXAM: CHEST - 2 VIEW COMPARISON:  03/02/2021. FINDINGS: Exam detail is diminished due to patient's body habitus and patient positioning. Stable cardiomediastinal contours. Asymmetric elevation of the right hemidiaphragm identified. Diffuse pulmonary vascular congestion noted. No superimposed airspace consolidation. IMPRESSION: 1. Diffuse pulmonary vascular congestion. 2. Stable asymmetric elevation of the right hemidiaphragm. Electronically Signed   By: Kerby Moors M.D.   On: 04/23/2021 17:31   CT Abdomen Pelvis W Contrast  Result Date: 04/23/2021 CLINICAL DATA:  Bladder dysfunction. EXAM: CT ABDOMEN AND PELVIS WITH CONTRAST TECHNIQUE: Multidetector CT imaging of the abdomen and pelvis was performed using the standard protocol following bolus administration of intravenous contrast. RADIATION DOSE REDUCTION: This exam was performed according to the departmental dose-optimization program which includes automated exposure control, adjustment of the mA and/or kV according to patient size and/or use of iterative reconstruction technique. CONTRAST:  13mL OMNIPAQUE IOHEXOL 300 MG/ML  SOLN COMPARISON:  05/21/2020. FINDINGS: Lower chest: The heart is enlarged. Atelectasis is present at the lung bases. Hepatobiliary: Fatty infiltration of the liver is noted. No focal abnormality is identified. The gallbladder is not visualized on exam. No biliary ductal dilatation. Pancreas: Pancreatic atrophy is noted. No ductal dilatation or surrounding inflammatory changes. Spleen: Normal in size without focal abnormality. Adrenals/Urinary Tract: The adrenal glands are within normal limits. No renal calculus or hydronephrosis. There is a subcentimeter hypodensity in the upper pole of the left kidney, likely cyst. A small amount of air is noted in the urinary bladder which may be  iatrogenic. Stomach/Bowel: The stomach is within normal limits. No bowel obstruction, free air, or pneumatosis. A few scattered diverticula are present along the colon without evidence of diverticulitis. There is fatty infiltration of the walls of the ascending and transverse colon suggesting chronic inflammatory changes. The appendix is not visualized on exam. Vascular/Lymphatic: Aortic atherosclerosis. No enlarged abdominal or pelvic lymph nodes. Reproductive: Status post hysterectomy. No adnexal masses. Other: Small fat containing umbilical hernia. No ascites. A left breast implant is noted. Bilateral breast implants are noted. Musculoskeletal: There are compression deformities at T10, T12, L1, L2, and L5. Vertebroplasty changes are noted at T12. The compression deformity L2 is new from 2020. Multilevel degenerative changes are noted in the thoracic spine. IMPRESSION: 1. Small focus of air in the urinary bladder which may be iatrogenic. No bladder wall thickening or obstructive uropathy. 2. Multilevel compression deformities in the thoracolumbar spine. The compression deformity L2 is new from 2020, however indeterminate in age. 3. Aortic atherosclerosis. 4. Cardiomegaly. 5. Remaining findings as described above. Electronically Signed   By: Regan Rakers.D.  On: 04/23/2021 20:23        Scheduled Meds:  apixaban  5 mg Oral BID   isosorbide mononitrate  30 mg Oral Daily   levothyroxine  125 mcg Oral Q0600   loratadine  10 mg Oral Daily   ofloxacin  1 drop Right Eye BID   pantoprazole  40 mg Oral Daily   polyethylene glycol  17 g Oral Daily   [START ON 04/26/2021] potassium chloride  10 mEq Oral Daily   potassium chloride  40 mEq Oral Once   senna-docusate  1 tablet Oral BID   Continuous Infusions:  magnesium sulfate bolus IVPB       LOS: 1 day    Time spent: 35 minutes    Irine Seal, MD [facility]   To contact the attending provider between 7A-7P or the covering  provider during after hours 7P-7A, please log into the web site [email or web address] and access using universal [place] password for that web site. If you do not have the password, please call the hospital operator.  04/25/2021, 10:32 AM

## 2021-04-26 DIAGNOSIS — I10 Essential (primary) hypertension: Secondary | ICD-10-CM | POA: Diagnosis not present

## 2021-04-26 DIAGNOSIS — E876 Hypokalemia: Secondary | ICD-10-CM | POA: Diagnosis not present

## 2021-04-26 DIAGNOSIS — E860 Dehydration: Secondary | ICD-10-CM | POA: Diagnosis not present

## 2021-04-26 DIAGNOSIS — Z86711 Personal history of pulmonary embolism: Secondary | ICD-10-CM | POA: Diagnosis not present

## 2021-04-26 LAB — CBC
HCT: 40.3 % (ref 36.0–46.0)
Hemoglobin: 13.2 g/dL (ref 12.0–15.0)
MCH: 30.2 pg (ref 26.0–34.0)
MCHC: 32.8 g/dL (ref 30.0–36.0)
MCV: 92.2 fL (ref 80.0–100.0)
Platelets: 227 10*3/uL (ref 150–400)
RBC: 4.37 MIL/uL (ref 3.87–5.11)
RDW: 12.9 % (ref 11.5–15.5)
WBC: 9.3 10*3/uL (ref 4.0–10.5)
nRBC: 0 % (ref 0.0–0.2)

## 2021-04-26 LAB — BASIC METABOLIC PANEL
Anion gap: 9 (ref 5–15)
BUN: 6 mg/dL — ABNORMAL LOW (ref 8–23)
CO2: 28 mmol/L (ref 22–32)
Calcium: 8.1 mg/dL — ABNORMAL LOW (ref 8.9–10.3)
Chloride: 93 mmol/L — ABNORMAL LOW (ref 98–111)
Creatinine, Ser: 0.46 mg/dL (ref 0.44–1.00)
GFR, Estimated: >60 mL/min (ref >60)
Glucose, Bld: 79 mg/dL (ref 70–99)
Potassium: 3.6 mmol/L (ref 3.5–5.1)
Sodium: 130 mmol/L — ABNORMAL LOW (ref 135–145)

## 2021-04-26 LAB — URINE CULTURE: Culture: 40000 — AB

## 2021-04-26 LAB — MAGNESIUM: Magnesium: 1.8 mg/dL (ref 1.7–2.4)

## 2021-04-26 MED ORDER — ENSURE ENLIVE PO LIQD
237.0000 mL | Freq: Two times a day (BID) | ORAL | Status: DC
  Administered 2021-04-26 – 2021-04-30 (×8): 237 mL via ORAL

## 2021-04-26 MED ORDER — POTASSIUM CHLORIDE CRYS ER 10 MEQ PO TBCR
40.0000 meq | EXTENDED_RELEASE_TABLET | Freq: Once | ORAL | Status: AC
Start: 2021-04-26 — End: 2021-04-26
  Administered 2021-04-26: 40 meq via ORAL
  Filled 2021-04-26: qty 4

## 2021-04-26 MED ORDER — FOSFOMYCIN TROMETHAMINE 3 G PO PACK
3.0000 g | PACK | Freq: Once | ORAL | Status: AC
  Administered 2021-04-26: 3 g via ORAL
  Filled 2021-04-26: qty 3

## 2021-04-26 MED ORDER — ADULT MULTIVITAMIN W/MINERALS CH
1.0000 | ORAL_TABLET | Freq: Every day | ORAL | Status: DC
  Administered 2021-04-26 – 2021-04-30 (×5): 1 via ORAL
  Filled 2021-04-26 (×5): qty 1

## 2021-04-26 MED ORDER — FUROSEMIDE 10 MG/ML IJ SOLN
20.0000 mg | Freq: Once | INTRAMUSCULAR | Status: AC
  Administered 2021-04-26: 20 mg via INTRAVENOUS
  Filled 2021-04-26: qty 2

## 2021-04-26 MED ORDER — DICLOFENAC SODIUM 1 % EX GEL
2.0000 g | Freq: Four times a day (QID) | CUTANEOUS | Status: DC | PRN
  Administered 2021-04-26 – 2021-04-29 (×8): 2 g via TOPICAL
  Filled 2021-04-26: qty 100

## 2021-04-26 MED ORDER — POTASSIUM CHLORIDE CRYS ER 10 MEQ PO TBCR
10.0000 meq | EXTENDED_RELEASE_TABLET | Freq: Every day | ORAL | Status: DC
  Administered 2021-04-27 – 2021-04-29 (×3): 10 meq via ORAL
  Filled 2021-04-26 (×4): qty 1

## 2021-04-26 MED ORDER — OXYCODONE HCL 5 MG PO TABS
5.0000 mg | ORAL_TABLET | Freq: Once | ORAL | Status: AC
  Administered 2021-04-26: 5 mg via ORAL
  Filled 2021-04-26: qty 1

## 2021-04-26 NOTE — Progress Notes (Signed)
Initial Nutrition Assessment  DOCUMENTATION CODES:   Obesity unspecified  INTERVENTION:   Ensure Enlive po BID, each supplement provides 350 kcal and 20 grams of protein.  MVI with minerals daily.  NUTRITION DIAGNOSIS:   Inadequate oral intake related to decreased appetite as evidenced by per patient/family report.  GOAL:   Patient will meet greater than or equal to 90% of their needs  MONITOR:   PO intake, Supplement acceptance  REASON FOR ASSESSMENT:   Malnutrition Screening Tool    ASSESSMENT:    88 yo female admitted with dehydration. PMH includes chronic back pain, DM-2, HTN, hypothyroidism.  RD working remotely. Unable to reach patient by phone. Since admission, patient has received IVF for dehydration. Awaiting PT/OT recommendations for disposition. Hypomagnesemia and hypokalemia have been replaced. Both are WNL today.   Labs reviewed. Na 130  Medications reviewed and include Protonix, Miralax, Senokot-S.  Patient was admitted last month for PNA.    On admission nutrition screen, patient reported unknown weight loss with recent poor intake r/t decreased appetite for a few weeks PTA. Suspect intake has been less than usual d/t acute illness (PNA) and decreased appetite.   Weight history reviewed. No weight loss noted over the past month.    NUTRITION - FOCUSED PHYSICAL EXAM:  Unable to complete  Diet Order:   Diet Order             Diet Heart Room service appropriate? Yes; Fluid consistency: Thin  Diet effective now                   EDUCATION NEEDS:   Not appropriate for education at this time  Skin:  Skin Assessment: Reviewed RN Assessment  Last BM:  2/4 type 6  Height:   Ht Readings from Last 1 Encounters:  04/24/21 5\' 8"  (1.727 m)    Weight:   Wt Readings from Last 1 Encounters:  04/24/21 113.4 kg    BMI:  Body mass index is 38.01 kg/m.  Estimated Nutritional Needs:   Kcal:  1700-1900  Protein:  110-125  gm  Fluid:  >/= 2 L    Lucas Mallow RD, LDN, CNSC Please refer to Amion for contact information.

## 2021-04-26 NOTE — Progress Notes (Addendum)
PROGRESS NOTE    Angela [name]  JAS:[id number] DOB: 02-May-1932 DOA: 04/23/2021 PCP: [name], MD    Chief Complaint  Patient presents with   Abdominal Pain    Brief Narrative:  Patient is 88 year old female history of chronic back pain on Percocet, type 2 diabetes, hypertension presented to the ED with abdominal pain and dysuria, poor mobility and dehydration.  Patient noted to have recently been hospitalized for pneumonia last Monday and since then had not really been ambulatory and being seen at home by home health.  Patient seen in the ED, CT abdomen and pelvis with no acute findings, age-indeterminate compression fractures noted, patient noted to be hypokalemic with a potassium of 2.9, hypomagnesemic with a potassium of 1.1.  Patient admitted placed on IV fluids, repletion of electrolytes, PT OT assessed patient and recommending SNF.   Assessment & Plan:   Principal Problem:   Dehydration Active Problems:   Hypertension   Hypothyroidism (acquired)   Hypokalemia   Hypomagnesemia   History of pulmonary embolus (PE)   Chronic back pain   Immobility  #1 dehydration -In the setting of immobility. -Patient hydrated with IV fluids. -Saline lock IV fluids. -Follow.  2.  Immobility -Noted throbbing worsens since discharge from hospitalization last month. -PT/OT recommending SNF placement. -TOC consulted for SNF placement.  3.  Chronic back pain -Continue home regimen Percocet. -Resume home regimen Voltaren gel.  4.  History of PE -Patient with no overt bleeding. -Continue Eliquis for anticoagulation.  5.  Hypomagnesemia/hypokalemia, POA -Potassium at 3.6.  Magnesium at 1.8.    -KCL 40 meq x 1.  6.  Hypothyroidism -Synthroid.   7.  GERD -Continue PPI.  8.  Hyponatremia  -IV fluids saline lock.   -Lasix 20 mg IV x1.   -Repeat labs in the morning.  9.?  Vancomycin resistant Enterococcus UTI -Patient had presented with lower abdominal pain, dysuria on  admission. -Urine cultures with 40,000 colonies of VRE. -Fosfomycin p.o. x1    DVT prophylaxis: Eliquis Code Status: Full Family Communication: Updated patient.  No family at bedside. Disposition: SNF  Status is: Inpatient Remains inpatient appropriate because: Severity of illness           Consultants:  None  Procedures:  CT abdomen pelvis 04/23/2021 Chest x-ray 04/23/2021   Antimicrobials:  Fosfomycin p.o. x1,  04/26/2021   Subjective: Patient laying in bed.  States had a horrible night with complaints of severe bilateral knee pain.  States Percocet is the only thing that helps with her knee pain.  No chest pain.  No shortness of breath.  No abdominal pain.  States dysuria improved.   Objective: Vitals:   04/25/21 1020 04/25/21 1335 04/25/21 2102 04/26/21 0540  BP: (!) 126/55 (!) 143/72 (!) 149/69 (!) 141/73  Pulse: 85 85 84 84  Resp: 18 18 18 18   Temp: 98.5 F (36.9 C) 97.9 F (36.6 C) 98.9 F (37.2 C) 97.9 F (36.6 C)  TempSrc: Oral Oral Oral Oral  SpO2: 93% 94% 96% 95%  Weight:      Height:        Intake/Output Summary (Last 24 hours) at 04/26/2021 1016 Last data filed at 04/26/2021 4193 Gross per 24 hour  Intake 760 ml  Output 400 ml  Net 360 ml   Filed Weights   04/24/21 1153  Weight: 113.4 kg    Examination:  General exam: NAD. Respiratory system: CTA B.  No wheezes, no crackles, no rhonchi.  Fair air movement.  Speaking in full sentences.  Cardiovascular system: Regular rate and rhythm no murmurs rubs or gallops.  No JVD.  No pitting lower extremity edema. Gastrointestinal system: Abdomen is soft, nontender, nondistended, positive bowel sounds.  No rebound.  No guarding.  Central nervous system: Alert and oriented. No focal neurological deficits. Extremities: Symmetric 5 x 5 power. Skin: No rashes, lesions or ulcers Psychiatry: Judgement and insight appear normal. Mood & affect appropriate.     Data Reviewed: I have personally reviewed  following labs and imaging studies  CBC: Recent Labs  Lab 04/23/21 1633 04/24/21 0420 04/25/21 0758 04/26/21 0640  WBC 14.4* 11.9* 9.7 9.3  NEUTROABS 10.0*  --  6.4  --   HGB 14.1 14.3 13.5 13.2  HCT 42.1 44.0 40.3 40.3  MCV 92.1 92.8 92.2 92.2  PLT 243 239 225 227     Basic Metabolic Panel: Recent Labs  Lab 04/23/21 1633 04/24/21 0420 04/25/21 0758 04/26/21 0640  NA 133* 132* 130* 130*  K 2.9* 3.8 3.3* 3.6  CL 94* 94* 95* 93*  CO2 31 28 28 28   GLUCOSE 113* 110* 94 79  BUN 9 9 6* 6*  CREATININE 0.57 0.59 0.43* 0.46  CALCIUM 7.9* 8.0* 7.9* 8.1*  MG 1.1* 1.6* 1.7 1.8     GFR: Estimated Creatinine Clearance: 66.7 mL/min (by C-G formula based on SCr of 0.46 mg/dL).  Liver Function Tests: Recent Labs  Lab 04/23/21 1633  AST 22  ALT 14  ALKPHOS 114  BILITOT 1.1  PROT 5.5*  ALBUMIN 2.7*     CBG: Recent Labs  Lab 04/23/21 2136  GLUCAP 95      Recent Results (from the past 240 hour(s))  Resp Panel by RT-PCR (Flu A&B, Covid) Nasopharyngeal Swab     Status: None   Collection Time: 04/23/21  5:55 PM   Specimen: Nasopharyngeal Swab; Nasopharyngeal(NP) swabs in vial transport medium  Result Value Ref Range Status   SARS Coronavirus 2 by RT PCR NEGATIVE NEGATIVE Final    Comment: (NOTE) SARS-CoV-2 target nucleic acids are NOT DETECTED.  The SARS-CoV-2 RNA is generally detectable in upper respiratory specimens during the acute phase of infection. The lowest concentration of SARS-CoV-2 viral copies this assay can detect is 138 copies/mL. A negative result does not preclude SARS-Cov-2 infection and should not be used as the sole basis for treatment or other patient management decisions. A negative result may occur with  improper specimen collection/handling, submission of specimen other than nasopharyngeal swab, presence of viral mutation(s) within the areas targeted by this assay, and inadequate number of viral copies(<138 copies/mL). A negative result  must be combined with clinical observations, patient history, and epidemiological information. The expected result is Negative.  Fact Sheet for Patients:  EntrepreneurPulse.com.au  Fact Sheet for Healthcare Providers:  IncredibleEmployment.be  This test is no t yet approved or cleared by the [place] FDA and  has been authorized for detection and/or diagnosis of SARS-CoV-2 by FDA under an Emergency Use Authorization (EUA). This EUA will remain  in effect (meaning this test can be used) for the duration of the COVID-19 declaration under Section 564(b)(1) of the Act, 21 U.S.C.section 360bbb-3(b)(1), unless the authorization is terminated  or revoked sooner.       Influenza A by PCR NEGATIVE NEGATIVE Final   Influenza B by PCR NEGATIVE NEGATIVE Final    Comment: (NOTE) The Xpert Xpress SARS-CoV-2/FLU/RSV plus assay is intended as an aid in the diagnosis of influenza from Nasopharyngeal swab specimens and should  not be used as a sole basis for treatment. Nasal washings and aspirates are unacceptable for Xpert Xpress SARS-CoV-2/FLU/RSV testing.  Fact Sheet for Patients: EntrepreneurPulse.com.au  Fact Sheet for Healthcare Providers: IncredibleEmployment.be  This test is not yet approved or cleared by the [place] FDA and has been authorized for detection and/or diagnosis of SARS-CoV-2 by FDA under an Emergency Use Authorization (EUA). This EUA will remain in effect (meaning this test can be used) for the duration of the COVID-19 declaration under Section 564(b)(1) of the Act, 21 U.S.C. section 360bbb-3(b)(1), unless the authorization is terminated or revoked.  Performed at [facility], [place] [address], [place], [place] [address]   Urine Culture     Status: Abnormal   Collection Time: 04/24/21  9:04 AM   Specimen: Urine, Catheterized  Result Value Ref Range Status   Specimen  Description   Final    URINE, CATHETERIZED Performed at [place] [address] [place], [place] [address]    Special Requests   Final    NONE Performed at [facility], [place] [address], [place], [place] [address]    Culture (A)  Final    40,000 COLONIES/mL ENTEROCOCCUS FAECIUM VANCOMYCIN RESISTANT ENTEROCOCCUS ISOLATED    Report Status 04/26/2021 FINAL  Final   Organism ID, Bacteria ENTEROCOCCUS FAECIUM (A)  Final      Susceptibility   Enterococcus faecium - MIC*    AMPICILLIN >=32 RESISTANT Resistant     NITROFURANTOIN 128 RESISTANT Resistant     VANCOMYCIN >=32 RESISTANT Resistant     LINEZOLID 2 SENSITIVE Sensitive     * 40,000 COLONIES/mL ENTEROCOCCUS FAECIUM          Radiology Studies: No results found.      Scheduled Meds:  apixaban  5 mg Oral BID   isosorbide mononitrate  30 mg Oral Daily   levothyroxine  125 mcg Oral Q0600   loratadine  10 mg Oral Daily   ofloxacin  1 drop Right Eye BID   pantoprazole  40 mg Oral Daily   polyethylene glycol  17 g Oral Daily   [START ON 04/27/2021] potassium chloride  10 mEq Oral Daily   senna-docusate  1 tablet Oral BID   Continuous Infusions:     LOS: 2 days    Time spent: 35 minutes    Irine Seal, MD [facility]   To contact the attending provider between 7A-7P or the covering provider during after hours 7P-7A, please log into the web site [email or web address] and access using universal [place] password for that web site. If you do not have the password, please call the hospital operator.  04/26/2021, 10:16 AM

## 2021-04-27 DIAGNOSIS — E860 Dehydration: Secondary | ICD-10-CM | POA: Diagnosis not present

## 2021-04-27 DIAGNOSIS — Z86711 Personal history of pulmonary embolism: Secondary | ICD-10-CM | POA: Diagnosis not present

## 2021-04-27 DIAGNOSIS — I10 Essential (primary) hypertension: Secondary | ICD-10-CM | POA: Diagnosis not present

## 2021-04-27 DIAGNOSIS — E876 Hypokalemia: Secondary | ICD-10-CM | POA: Diagnosis not present

## 2021-04-27 LAB — BASIC METABOLIC PANEL
Anion gap: 9 (ref 5–15)
BUN: 6 mg/dL — ABNORMAL LOW (ref 8–23)
CO2: 30 mmol/L (ref 22–32)
Calcium: 8.2 mg/dL — ABNORMAL LOW (ref 8.9–10.3)
Chloride: 94 mmol/L — ABNORMAL LOW (ref 98–111)
Creatinine, Ser: 0.38 mg/dL — ABNORMAL LOW (ref 0.44–1.00)
GFR, Estimated: >60 mL/min (ref >60)
Glucose, Bld: 96 mg/dL (ref 70–99)
Potassium: 3.3 mmol/L — ABNORMAL LOW (ref 3.5–5.1)
Sodium: 133 mmol/L — ABNORMAL LOW (ref 135–145)

## 2021-04-27 LAB — CBC
HCT: 40.5 % (ref 36.0–46.0)
Hemoglobin: 13.9 g/dL (ref 12.0–15.0)
MCH: 31 pg (ref 26.0–34.0)
MCHC: 34.3 g/dL (ref 30.0–36.0)
MCV: 90.4 fL (ref 80.0–100.0)
Platelets: 253 10*3/uL (ref 150–400)
RBC: 4.48 MIL/uL (ref 3.87–5.11)
RDW: 13.1 % (ref 11.5–15.5)
WBC: 8.9 10*3/uL (ref 4.0–10.5)
nRBC: 0 % (ref 0.0–0.2)

## 2021-04-27 LAB — MAGNESIUM: Magnesium: 1.5 mg/dL — ABNORMAL LOW (ref 1.7–2.4)

## 2021-04-27 MED ORDER — POTASSIUM CHLORIDE CRYS ER 10 MEQ PO TBCR
40.0000 meq | EXTENDED_RELEASE_TABLET | Freq: Once | ORAL | Status: AC
  Administered 2021-04-27: 40 meq via ORAL
  Filled 2021-04-27: qty 4

## 2021-04-27 MED ORDER — BENAZEPRIL HCL 10 MG PO TABS
20.0000 mg | ORAL_TABLET | Freq: Every day | ORAL | Status: DC
  Administered 2021-04-27: 20 mg via ORAL
  Filled 2021-04-27: qty 2

## 2021-04-27 MED ORDER — MELATONIN 5 MG PO TABS
5.0000 mg | ORAL_TABLET | Freq: Once | ORAL | Status: AC
  Administered 2021-04-27: 5 mg via ORAL
  Filled 2021-04-27: qty 1

## 2021-04-27 MED ORDER — FUROSEMIDE 10 MG/ML IJ SOLN
20.0000 mg | Freq: Once | INTRAMUSCULAR | Status: AC
  Administered 2021-04-27: 20 mg via INTRAVENOUS
  Filled 2021-04-27: qty 2

## 2021-04-27 MED ORDER — ALENDRONATE SODIUM 70 MG PO TABS: 70.0000 mg | ORAL_TABLET | ORAL | Status: DC

## 2021-04-27 MED ORDER — OXYCODONE-ACETAMINOPHEN 5-325 MG PO TABS: 1.0000 | ORAL_TABLET | Freq: Three times a day (TID) | ORAL | Status: DC | PRN

## 2021-04-27 MED ORDER — BENAZEPRIL-HYDROCHLOROTHIAZIDE 20-12.5 MG PO TABS: 1.0000 | ORAL_TABLET | Freq: Every day | ORAL | Status: DC

## 2021-04-27 MED ORDER — OXYCODONE HCL 5 MG PO TABS
5.0000 mg | ORAL_TABLET | Freq: Three times a day (TID) | ORAL | Status: DC | PRN
  Administered 2021-04-28 – 2021-04-30 (×7): 5 mg via ORAL
  Filled 2021-04-27 (×7): qty 1

## 2021-04-27 MED ORDER — VITAMIN D (ERGOCALCIFEROL) 1.25 MG (50000 UNIT) PO CAPS
50000.0000 [IU] | ORAL_CAPSULE | ORAL | Status: DC
  Administered 2021-04-27: 50000 [IU] via ORAL
  Filled 2021-04-27: qty 1

## 2021-04-27 MED ORDER — MAGNESIUM SULFATE 4 GM/100ML IV SOLN
4.0000 g | Freq: Once | INTRAVENOUS | Status: AC
  Administered 2021-04-27: 4 g via INTRAVENOUS
  Filled 2021-04-27: qty 100

## 2021-04-27 MED ORDER — HYDROCHLOROTHIAZIDE 12.5 MG PO TABS
12.5000 mg | ORAL_TABLET | Freq: Every day | ORAL | Status: DC
  Administered 2021-04-27: 12.5 mg via ORAL
  Filled 2021-04-27: qty 1

## 2021-04-27 NOTE — Progress Notes (Signed)
PROGRESS NOTE    Angela [name] Ward Gallant  XNA:[id number] DOB: [birth year] DOA: 04/23/2021 PCP: [name], MD    Chief Complaint  Patient presents with   Abdominal Pain    Brief Narrative:  Patient is 88 year old female history of chronic back pain on Percocet, type 2 diabetes, hypertension presented to the ED with abdominal pain and dysuria, poor mobility and dehydration.  Patient noted to have recently been hospitalized for pneumonia last Monday and since then had not really been ambulatory and being seen at home by home health.  Patient seen in the ED, CT abdomen and pelvis with no acute findings, age-indeterminate compression fractures noted, patient noted to be hypokalemic with a potassium of 2.9, hypomagnesemic with a potassium of 1.1.  Patient admitted placed on IV fluids, repletion of electrolytes, PT OT assessed patient and recommending SNF.   Assessment & Plan:   Principal Problem:   Dehydration Active Problems:   Hypertension   Hypothyroidism (acquired)   Hypokalemia   Hypomagnesemia   History of pulmonary embolus (PE)   Chronic back pain   Immobility  #1 dehydration -In the setting of immobility. -Patient hydrated with IV fluids. -Saline lock IV fluids. -Follow.  2.  Immobility -Noted throbbing worsens since discharge from hospitalization last month. -PT/OT recommending SNF placement. -TOC consulted for SNF placement.  3.  Chronic back pain -Continue home regimen Percocet, Voltaren gel.  4.  History of PE -Patient with no overt bleeding. -Continue Eliquis for anticoagulation.  5.  Hypomagnesemia/hypokalemia, POA -Potassium at 3.3, magnesium of 1.5.   -KCl 40 mEq p.o. x1.   -Magnesium sulfate 4 g IV x1.   -Repeat labs in the morning.   6.  Hypothyroidism -Continue Synthroid.   7.  GERD -PPI.    8.  Hyponatremia  -IV fluids saline lock.   -Improving with diuretics.   -Lasix 20 mg IV x1.  -Repeat labs in the morning.  9.?  Vancomycin resistant  Enterococcus UTI -Patient had presented with lower abdominal pain, dysuria on admission. -Urine cultures with 40,000 colonies of VRE. -Status post fosfomycin p.o. x1. -No further antibiotics needed.    DVT prophylaxis: Eliquis Code Status: Full Family Communication: Updated patient.  No family at bedside. Disposition: SNF  Status is: Inpatient Remains inpatient appropriate because: Severity of illness           Consultants:  None  Procedures:  CT abdomen pelvis 04/23/2021 Chest x-ray 04/23/2021   Antimicrobials:  Fosfomycin p.o. x1,  04/26/2021   Subjective: Laying in bed.  Overall feeling better.  Feels knee pain is better controlled.  No chest pain.  No shortness of breath.  Dysuria improved.  No abdominal pain.    Objective: Vitals:   04/26/21 0540 04/26/21 1323 04/26/21 2149 04/27/21 0549  BP: (!) 141/73 140/78 100/60 (!) 149/82  Pulse: 84 95 90 83  Resp: 18 16 20    Temp: 97.9 F (36.6 C) 98.1 F (36.7 C) 97.9 F (36.6 C) 98.7 F (37.1 C)  TempSrc: Oral Oral Oral Oral  SpO2: 95% 91% 93% 95%  Weight:      Height:        Intake/Output Summary (Last 24 hours) at 04/27/2021 0958 Last data filed at 04/27/2021 0542 Gross per 24 hour  Intake 960 ml  Output 1850 ml  Net -890 ml    Filed Weights   04/24/21 1153  Weight: 113.4 kg    Examination:  General exam: NAD. Respiratory system: Lungs clear to auscultation bilaterally.  No  wheezes, no crackles, no rhonchi.  Fair air movement.   Cardiovascular system: RRR no murmurs rubs or gallops.  No JVD.  No pitting lower extremity edema.   Gastrointestinal system: Abdomen is soft, nontender, nondistended, positive bowel sounds.  No rebound.  No guarding.  Central nervous system: Alert and oriented. No focal neurological deficits. Extremities: Symmetric 5 x 5 power. Skin: No rashes, lesions or ulcers Psychiatry: Judgement and insight appear normal. Mood & affect appropriate.     Data Reviewed: I have  personally reviewed following labs and imaging studies  CBC: Recent Labs  Lab 04/23/21 1633 04/24/21 0420 04/25/21 0758 04/26/21 0640 04/27/21 0540  WBC 14.4* 11.9* 9.7 9.3 8.9  NEUTROABS 10.0*  --  6.4  --   --   HGB 14.1 14.3 13.5 13.2 13.9  HCT 42.1 44.0 40.3 40.3 40.5  MCV 92.1 92.8 92.2 92.2 90.4  PLT 243 239 225 227 253     Basic Metabolic Panel: Recent Labs  Lab 04/23/21 1633 04/24/21 0420 04/25/21 0758 04/26/21 0640 04/27/21 0540  NA 133* 132* 130* 130* 133*  K 2.9* 3.8 3.3* 3.6 3.3*  CL 94* 94* 95* 93* 94*  CO2 31 28 28 28 30   GLUCOSE 113* 110* 94 79 96  BUN 9 9 6* 6* 6*  CREATININE 0.57 0.59 0.43* 0.46 0.38*  CALCIUM 7.9* 8.0* 7.9* 8.1* 8.2*  MG 1.1* 1.6* 1.7 1.8 1.5*     GFR: Estimated Creatinine Clearance: 66.7 mL/min (A) (by C-G formula based on SCr of 0.38 mg/dL (L)).  Liver Function Tests: Recent Labs  Lab 04/23/21 1633  AST 22  ALT 14  ALKPHOS 114  BILITOT 1.1  PROT 5.5*  ALBUMIN 2.7*     CBG: Recent Labs  Lab 04/23/21 2136  GLUCAP 95      Recent Results (from the past 240 hour(s))  Resp Panel by RT-PCR (Flu A&B, Covid) Nasopharyngeal Swab     Status: None   Collection Time: 04/23/21  5:55 PM   Specimen: Nasopharyngeal Swab; Nasopharyngeal(NP) swabs in vial transport medium  Result Value Ref Range Status   SARS Coronavirus 2 by RT PCR NEGATIVE NEGATIVE Final    Comment: (NOTE) SARS-CoV-2 target nucleic acids are NOT DETECTED.  The SARS-CoV-2 RNA is generally detectable in upper respiratory specimens during the acute phase of infection. The lowest concentration of SARS-CoV-2 viral copies this assay can detect is 138 copies/mL. A negative result does not preclude SARS-Cov-2 infection and should not be used as the sole basis for treatment or other patient management decisions. A negative result may occur with  improper specimen collection/handling, submission of specimen other than nasopharyngeal swab, presence of viral  mutation(s) within the areas targeted by this assay, and inadequate number of viral copies(<138 copies/mL). A negative result must be combined with clinical observations, patient history, and epidemiological information. The expected result is Negative.  Fact Sheet for Patients:  EntrepreneurPulse.com.au  Fact Sheet for Healthcare Providers:  IncredibleEmployment.be  This test is no t yet approved or cleared by the [place] FDA and  has been authorized for detection and/or diagnosis of SARS-CoV-2 by FDA under an Emergency Use Authorization (EUA). This EUA will remain  in effect (meaning this test can be used) for the duration of the COVID-19 declaration under Section 564(b)(1) of the Act, 21 U.S.C.section 360bbb-3(b)(1), unless the authorization is terminated  or revoked sooner.       Influenza A by PCR NEGATIVE NEGATIVE Final   Influenza B by PCR NEGATIVE NEGATIVE  Final    Comment: (NOTE) The Xpert Xpress SARS-CoV-2/FLU/RSV plus assay is intended as an aid in the diagnosis of influenza from Nasopharyngeal swab specimens and should not be used as a sole basis for treatment. Nasal washings and aspirates are unacceptable for Xpert Xpress SARS-CoV-2/FLU/RSV testing.  Fact Sheet for Patients: EntrepreneurPulse.com.au  Fact Sheet for Healthcare Providers: IncredibleEmployment.be  This test is not yet approved or cleared by the [place] FDA and has been authorized for detection and/or diagnosis of SARS-CoV-2 by FDA under an Emergency Use Authorization (EUA). This EUA will remain in effect (meaning this test can be used) for the duration of the COVID-19 declaration under Section 564(b)(1) of the Act, 21 U.S.C. section 360bbb-3(b)(1), unless the authorization is terminated or revoked.  Performed at [facility], [place] [address] [place], [place] [address]   Urine Culture      Status: Abnormal   Collection Time: 04/24/21  9:04 AM   Specimen: Urine, Catheterized  Result Value Ref Range Status   Specimen Description   Final    URINE, CATHETERIZED Performed at [facility] [address], [place], [place] [address]    Special Requests   Final    NONE Performed at [facility], [place] [address] [place],  [address]    Culture (A)  Final    40,000 COLONIES/mL ENTEROCOCCUS FAECIUM VANCOMYCIN RESISTANT ENTEROCOCCUS ISOLATED    Report Status 04/26/2021 FINAL  Final   Organism ID, Bacteria ENTEROCOCCUS FAECIUM (A)  Final      Susceptibility   Enterococcus faecium - MIC*    AMPICILLIN >=32 RESISTANT Resistant     NITROFURANTOIN 128 RESISTANT Resistant     VANCOMYCIN >=32 RESISTANT Resistant     LINEZOLID 2 SENSITIVE Sensitive     * 40,000 COLONIES/mL ENTEROCOCCUS FAECIUM          Radiology Studies: No results found.      Scheduled Meds:  alendronate  70 mg Oral Weekly   apixaban  5 mg Oral BID   feeding supplement  237 mL Oral BID BM   furosemide  20 mg Intravenous Once   isosorbide mononitrate  30 mg Oral Daily   levothyroxine  125 mcg Oral Q0600   loratadine  10 mg Oral Daily   multivitamin with minerals  1 tablet Oral Daily   ofloxacin  1 drop Right Eye BID   pantoprazole  40 mg Oral Daily   polyethylene glycol  17 g Oral Daily   potassium chloride  10 mEq Oral Daily   potassium chloride  40 mEq Oral Once   senna-docusate  1 tablet Oral BID   Vitamin D (Ergocalciferol)  50,000 Units Oral Q7 days   Continuous Infusions:  magnesium sulfate bolus IVPB        LOS: 3 days    Time spent: 35 minutes    Irine Seal, MD [facility]   To contact the attending provider between 7A-7P or the covering provider during after hours 7P-7A, please log into the web site [email or web address] and access using universal [place] password for that web site. If you do not have the password, please  call the hospital operator.  04/27/2021, 9:58 AM

## 2021-04-27 NOTE — Progress Notes (Addendum)
Patient getting oxycodone for pain but she c/o it is not effective as a percocet (which she takes at home).

## 2021-04-27 NOTE — Progress Notes (Signed)
Physical Therapy Treatment Patient Details Name: Angela [name] MRN: [id number] DOB: 06-20-1932 Today's Date: 04/27/2021   History of Present Illness Pt admitted from home with c/o Abdominal pain, and limited mobility and dx with dehydration.  Pt with hx of DM, breast CA s/p L mastectomy, chronic back pain and recent hospital stay 2* PNA.    PT Comments    Patient progressing gradually with mobility and min assist +2 needed to sit up EOB. Pt requesting BSC once upright and 2+ Mod assist provided with RW to take small steps over to [facility]. Pt with no bucling of LE's or LOB stepping laterally to move bed<>BSC. EOS return to bed and Mod +2 to reposition. Educated pt on benefit of skilled PT at SNF setting for [place] rehab prior to return home to improve independence with mobility. Pt does report she has 24/7 home nursing/aids to assist. If she went  home with aids she would need a wheelchair for functional distances with mobility. Acute PT will progress as able.    Recommendations for follow up therapy are one component of a multi-disciplinary discharge planning process, led by the attending physician.  Recommendations may be updated based on patient status, additional functional criteria and insurance authorization.  Follow Up Recommendations  Skilled nursing-short term rehab (<3 hours/day)     Assistance Recommended at Discharge Frequent or constant Supervision/Assistance  Patient can return home with the following A lot of help with walking and/or transfers;A lot of help with bathing/dressing/bathroom;Assistance with cooking/housework;Assist for transportation;Help with stairs or ramp for entrance   Equipment Recommendations  None recommended by PT    Recommendations for Other Services       Precautions / Restrictions Precautions Precautions: Fall Restrictions Weight Bearing Restrictions: No     Mobility  Bed Mobility Overal bed mobility: Needs Assistance Bed Mobility: Supine to  Sit, Sit to Supine     Supine to sit: Min assist, +2 for physical assistance, +2 for safety/equipment, HOB elevated Sit to supine: Mod assist, +2 for physical assistance, +2 for safety/equipment   General bed mobility comments: min assist with cues for use of bed rail, assist for bil LE's off EOB and pt c/o pain in LE's with touch. Mod assist to return to supine and reposition in bed.    Transfers Overall transfer level: Needs assistance Equipment used: Rolling walker (2 wheels) Transfers: Sit to/from Stand, Bed to chair/wheelchair/BSC Sit to Stand: Mod assist, +2 safety/equipment, +2 physical assistance   Step pivot transfers: Mod assist, +2 physical assistance, +2 safety/equipment       General transfer comment: Mod +2 assist with RW for power up from EOB and to take small steps to move to [facility]. pt unable to void bladder/bowels on [facility] and requested return to bed. Cues needed for safe hand placement on RW throughout.    Ambulation/Gait                   Stairs             Wheelchair Mobility    Modified Rankin (Stroke Patients Only)       Balance Overall balance assessment: Needs assistance Sitting-balance support: No upper extremity supported, Feet unsupported Sitting balance-Leahy Scale: Good     Standing balance support: Reliant on assistive device for balance, During functional activity, Bilateral upper extremity supported Standing balance-Leahy Scale: Fair  Cognition Arousal/Alertness: Awake/alert Behavior During Therapy: WFL for tasks assessed/performed Overall Cognitive Status: Within Functional Limits for tasks assessed                                          Exercises      General Comments        Pertinent Vitals/Pain Pain Assessment Pain Assessment: Faces Faces Pain Scale: Hurts little more Pain Location: bil LE's with touch Pain Descriptors / Indicators: Grimacing,  Sore Pain Intervention(s): Limited activity within patient's tolerance, Monitored during session, Repositioned    Home Living                          Prior Function            PT Goals (current goals can now be found in the care plan section) Acute Rehab PT Goals PT Goal Formulation: With patient Time For Goal Achievement: 05/08/21 Potential to Achieve Goals: Good Progress towards PT goals: Progressing toward goals    Frequency    Min 3X/week      PT Plan Current plan remains appropriate    Co-evaluation              AM-PAC PT "6 Clicks" Mobility   Outcome Measure  Help needed turning from your back to your side while in a flat bed without using bedrails?: A Little Help needed moving from lying on your back to sitting on the side of a flat bed without using bedrails?: A Lot Help needed moving to and from a bed to a chair (including a wheelchair)?: A Lot Help needed standing up from a chair using your arms (e.g., wheelchair or bedside chair)?: A Lot Help needed to walk in hospital room?: Total Help needed climbing 3-5 steps with a railing? : Total 6 Click Score: 11    End of Session Equipment Utilized During Treatment: Gait belt Activity Tolerance: Patient tolerated treatment well Patient left: in bed;with call bell/phone within reach;with bed alarm set Nurse Communication: Mobility status PT Visit Diagnosis: Muscle weakness (generalized) (M62.81);Difficulty in walking, not elsewhere classified (R26.2)     Time: 9826-4158 PT Time Calculation (min) (ACUTE ONLY): 21 min  Charges:  $Therapeutic Activity: 8-22 mins                     Angela [name], DPT Acute Rehabilitation Services Office [phone] Pager [phone]    Angela [name] 04/27/2021, 4:58 PM

## 2021-04-28 ENCOUNTER — Inpatient Hospital Stay (HOSPITAL_COMMUNITY): Payer: Medicare Other

## 2021-04-28 DIAGNOSIS — E876 Hypokalemia: Secondary | ICD-10-CM | POA: Diagnosis not present

## 2021-04-28 DIAGNOSIS — Z86711 Personal history of pulmonary embolism: Secondary | ICD-10-CM | POA: Diagnosis not present

## 2021-04-28 DIAGNOSIS — E860 Dehydration: Secondary | ICD-10-CM | POA: Diagnosis not present

## 2021-04-28 DIAGNOSIS — I10 Essential (primary) hypertension: Secondary | ICD-10-CM | POA: Diagnosis not present

## 2021-04-28 LAB — BASIC METABOLIC PANEL
Anion gap: 8 (ref 5–15)
BUN: 8 mg/dL (ref 8–23)
CO2: 30 mmol/L (ref 22–32)
Calcium: 8.5 mg/dL — ABNORMAL LOW (ref 8.9–10.3)
Chloride: 96 mmol/L — ABNORMAL LOW (ref 98–111)
Creatinine, Ser: 0.88 mg/dL (ref 0.44–1.00)
GFR, Estimated: >60 mL/min (ref >60)
Glucose, Bld: 111 mg/dL — ABNORMAL HIGH (ref 70–99)
Potassium: 3.7 mmol/L (ref 3.5–5.1)
Sodium: 134 mmol/L — ABNORMAL LOW (ref 135–145)

## 2021-04-28 LAB — MAGNESIUM: Magnesium: 2.2 mg/dL (ref 1.7–2.4)

## 2021-04-28 MED ORDER — LACTATED RINGERS IV BOLUS
500.0000 mL | Freq: Once | INTRAVENOUS | Status: AC
  Administered 2021-04-28: 500 mL via INTRAVENOUS

## 2021-04-28 MED ORDER — ISOSORBIDE MONONITRATE ER 30 MG PO TB24
30.0000 mg | ORAL_TABLET | Freq: Every day | ORAL | Status: DC
  Administered 2021-04-29 – 2021-04-30 (×2): 30 mg via ORAL
  Filled 2021-04-28 (×2): qty 1

## 2021-04-28 MED ORDER — GUAIFENESIN ER 600 MG PO TB12
1200.0000 mg | ORAL_TABLET | Freq: Two times a day (BID) | ORAL | Status: DC
  Administered 2021-04-28 – 2021-04-30 (×5): 1200 mg via ORAL
  Filled 2021-04-28 (×5): qty 2

## 2021-04-28 MED ORDER — TRAZODONE HCL 50 MG PO TABS
25.0000 mg | ORAL_TABLET | Freq: Once | ORAL | Status: AC | PRN
  Administered 2021-04-29: 25 mg via ORAL
  Filled 2021-04-28: qty 1

## 2021-04-28 MED ORDER — IPRATROPIUM-ALBUTEROL 0.5-2.5 (3) MG/3ML IN SOLN
3.0000 mL | Freq: Two times a day (BID) | RESPIRATORY_TRACT | Status: DC
  Administered 2021-04-28 – 2021-04-30 (×4): 3 mL via RESPIRATORY_TRACT
  Filled 2021-04-28 (×4): qty 3

## 2021-04-28 MED ORDER — MELATONIN 5 MG PO TABS
5.0000 mg | ORAL_TABLET | Freq: Once | ORAL | Status: AC
Start: 2021-04-28 — End: 2021-04-28
  Administered 2021-04-28: 5 mg via ORAL
  Filled 2021-04-28: qty 1

## 2021-04-28 MED ORDER — ALBUMIN HUMAN 25 % IV SOLN
25.0000 g | Freq: Once | INTRAVENOUS | Status: AC
  Administered 2021-04-28: 25 g via INTRAVENOUS
  Filled 2021-04-28: qty 100

## 2021-04-28 NOTE — Care Management Important Message (Signed)
Important Message  Patient Details Patient refused IM Letter. Name: Angela [name] MRN: [id number] Date of Birth: [birth year]   Medicare Important Message Given:  No     Kerin Salen 04/28/2021, 9:43 AM

## 2021-04-28 NOTE — Progress Notes (Signed)
Occupational Therapy Treatment Patient Details Name: Angela [name] MRN: [id number] DOB: 12-May-1932 Today's Date: 04/28/2021   History of present illness Pt admitted from home with c/o Abdominal pain, and limited mobility and dx with dehydration.  Pt with hx of DM, breast CA s/p L mastectomy, chronic back pain and recent hospital stay 2* PNA.   OT comments  Patient agreeable to getting up to chair. Patient needing increased time and min A to scoot hips forward toward edge of bed due to difficulty weight shifting "I feel stuck." Patient needing multiple attempts, cues for body mechanics and +2 assistance to power up to standing with bed height elevated. Once standing patient min A +1 to take few steps to recliner chair with cues to reach back when sitting. Patient with increased weakness "I can't believe it was that hard." Recommend continued acute OT services to maximize global strength and endurance needed for self care.   Recommendations for follow up therapy are one component of a multi-disciplinary discharge planning process, led by the attending physician.  Recommendations may be updated based on patient status, additional functional criteria and insurance authorization.    Follow Up Recommendations  Skilled nursing-short term rehab (<3 hours/day)    Assistance Recommended at Discharge Frequent or constant Supervision/Assistance  Patient can return home with the following  Two people to help with walking and/or transfers;A lot of help with bathing/dressing/bathroom;Help with stairs or ramp for entrance   Equipment Recommendations  None recommended by OT       Precautions / Restrictions Precautions Precautions: Fall Restrictions Weight Bearing Restrictions: No       Mobility Bed Mobility Overal bed mobility: Needs Assistance Bed Mobility: Supine to Sit     Supine to sit: Min assist, HOB elevated     General bed mobility comments: Min A to assist scoot R hip toward edge  of bed       Balance Overall balance assessment: Needs assistance Sitting-balance support: Feet supported Sitting balance-Leahy Scale: Good     Standing balance support: Reliant on assistive device for balance Standing balance-Leahy Scale: Poor                             ADL either performed or assessed with clinical judgement   ADL Overall ADL's : Needs assistance/impaired     Grooming: Set up;Sitting               Lower Body Dressing: Total assistance;Sitting/lateral leans Lower Body Dressing Details (indicate cue type and reason): to pull up socks Toilet Transfer: Maximal assistance;+2 for physical assistance;+2 for safety/equipment;Cueing for safety;Cueing for sequencing;Rolling walker (2 wheels) Toilet Transfer Details (indicate cue type and reason): Bed height elevated and needing max A +2 to power up to standing and multiple attempts. Once standing patient able to take few steps to recliner with min A and cues to reach back for chair for eccentric control.                  Cognition Arousal/Alertness: Awake/alert Behavior During Therapy: WFL for tasks assessed/performed Overall Cognitive Status: Within Functional Limits for tasks assessed                                                     Pertinent Vitals/ Pain  Pain Assessment Pain Assessment: Faces Faces Pain Scale: Hurts little more Pain Location: bil LE's with touch Pain Descriptors / Indicators: Grimacing, Sore Pain Intervention(s): Monitored during session         Frequency  Min 2X/week        Progress Toward Goals  OT Goals(current goals can now be found in the care plan section)  Progress towards OT goals: Progressing toward goals  Acute Rehab OT Goals Patient Stated Goal: Get stronger, be able to walk OT Goal Formulation: With patient Time For Goal Achievement: 05/08/21 Potential to Achieve Goals: Good ADL Goals Pt Will Transfer to  Toilet: with min guard assist;bedside commode (walker) Pt Will Perform Toileting - Clothing Manipulation and hygiene: with min assist;sit to/from stand;sitting/lateral leans Additional ADL Goal #1: PAtient will tolerate 15 minutes of out of bed activity in order to participate in daily self care tasks.  Plan Discharge plan remains appropriate       AM-PAC OT "6 Clicks" Daily Activity     Outcome Measure   Help from another person eating meals?: None Help from another person taking care of personal grooming?: A Little Help from another person toileting, which includes using toliet, bedpan, or urinal?: A Lot Help from another person bathing (including washing, rinsing, drying)?: A Lot Help from another person to put on and taking off regular upper body clothing?: A Little Help from another person to put on and taking off regular lower body clothing?: Total 6 Click Score: 15    End of Session Equipment Utilized During Treatment: Rolling walker (2 wheels);Gait belt  OT Visit Diagnosis: Other abnormalities of gait and mobility (R26.89);Muscle weakness (generalized) (M62.81)   Activity Tolerance Patient limited by fatigue   Patient Left in chair;with call bell/phone within reach;with chair alarm set;with nursing/sitter in room   Nurse Communication Mobility status        Time: 7253-6644 OT Time Calculation (min): 24 min  Charges: OT General Charges $OT Visit: 1 Visit OT Treatments $Self Care/Home Management : 23-37 mins  Delbert Phenix OT OT pager: [phone]   Rosemary Holms 04/28/2021, 2:11 PM

## 2021-04-28 NOTE — Progress Notes (Signed)
PROGRESS NOTE    Angela [name]  RFF:[id number] DOB: [birth year] DOA: 04/23/2021 PCP: [name], MD    Chief Complaint  Patient presents with   Abdominal Pain    Brief Narrative:  Patient is 88 year old female history of chronic back pain on Percocet, type 2 diabetes, hypertension presented to the ED with abdominal pain and dysuria, poor mobility and dehydration.  Patient noted to have recently been hospitalized for pneumonia last Monday and since then had not really been ambulatory and being seen at home by home health.  Patient seen in the ED, CT abdomen and pelvis with no acute findings, age-indeterminate compression fractures noted, patient noted to be hypokalemic with a potassium of 2.9, hypomagnesemic with a potassium of 1.1.  Patient admitted placed on IV fluids, repletion of electrolytes, PT OT assessed patient and recommending SNF.   Assessment & Plan:   Principal Problem:   Dehydration Active Problems:   Hypertension   Hypothyroidism (acquired)   Hypokalemia   Hypomagnesemia   History of pulmonary embolus (PE)   Chronic back pain   Immobility  #1 dehydration -In the setting of immobility. -Patient hydrated with IV fluids. -Saline lock IV fluids. -Follow.  2.  Immobility -Noted throbbing worsens since discharge from hospitalization last month. -PT/OT recommending SNF placement. -TOC consulted for SNF placement.  3.  Chronic back pain -Continue home regimen Percocet, Voltaren gel.  4.  History of PE -Patient with no overt bleeding. -Continue Eliquis for anticoagulation.  5.  Hypomagnesemia/hypokalemia, POA -Potassium at 3.7, magnesium of 2.2.   -Repeat labs in the morning.   6.  Hypothyroidism -Synthroid.   7.  GERD -PPI.    8.  Hyponatremia  -IV fluids saline lock.   -Improved with diuretics.   -Sodium at 134 this morning.  9.?  Vancomycin resistant Enterococcus UTI -Patient had presented with lower abdominal pain, dysuria on  admission. -Urine cultures with 40,000 colonies of VRE. -Status post fosfomycin p.o. x1. -No further antibiotics needed.  10.  Borderline blood pressure -Patient with noted borderline blood pressure overnight requiring fluid boluses. -Hold antihypertensive medications. -IV albumin x1.    DVT prophylaxis: Eliquis Code Status: Full Family Communication: Updated patient.  No family at bedside. Disposition: SNF  Status is: Inpatient Remains inpatient appropriate because: Severity of illness           Consultants:  None  Procedures:  CT abdomen pelvis 04/23/2021 Chest x-ray 04/23/2021 Chest x-ray 04/28/2021   Antimicrobials:  Fosfomycin p.o. x1,  04/26/2021   Subjective: Sitting up in chair.  Overall feeling better.  States knee pain better after pain medication adjusted.  No chest pain.  No shortness of breath.  Patient noted with cough.  Dysuria improved.  Denies any abdominal pain.  Patient noted with hypoxia this morning with borderline blood pressure and received a bolus of IVF overnight.  Objective: Vitals:   04/28/21 0133 04/28/21 0521 04/28/21 0809 04/28/21 1400  BP: 99/61 (!) 103/52  (!) 120/52  Pulse: 77 79  70  Resp:    19  Temp: 98 F (36.7 C) 97.9 F (36.6 C)  97.8 F (36.6 C)  TempSrc: Oral Oral  Oral  SpO2: 94% 95% (!) 89% 96%  Weight:      Height:        Intake/Output Summary (Last 24 hours) at 04/28/2021 1614 Last data filed at 04/28/2021 1427 Gross per 24 hour  Intake 780 ml  Output 275 ml  Net 505 ml    Filed  Weights   04/24/21 1153  Weight: 113.4 kg    Examination:  General exam: NAD. Respiratory system: CTA B.  No wheezes, no crackles, no rhonchi.  Fair air movement.  Cardiovascular system: Regular rate rhythm no murmurs rubs or gallops.  No JVD.  No pitting lower extremity edema. Gastrointestinal system: Abdomen is soft, nontender, nondistended, positive bowel sounds.  No rebound.  No guarding.  Central nervous system: Alert and  oriented. No focal neurological deficits. Extremities: Symmetric 5 x 5 power. Skin: No rashes, lesions or ulcers Psychiatry: Judgement and insight appear normal. Mood & affect appropriate.     Data Reviewed: I have personally reviewed following labs and imaging studies  CBC: Recent Labs  Lab 04/23/21 1633 04/24/21 0420 04/25/21 0758 04/26/21 0640 04/27/21 0540  WBC 14.4* 11.9* 9.7 9.3 8.9  NEUTROABS 10.0*  --  6.4  --   --   HGB 14.1 14.3 13.5 13.2 13.9  HCT 42.1 44.0 40.3 40.3 40.5  MCV 92.1 92.8 92.2 92.2 90.4  PLT 243 239 225 227 253     Basic Metabolic Panel: Recent Labs  Lab 04/24/21 0420 04/25/21 0758 04/26/21 0640 04/27/21 0540 04/28/21 0544  NA 132* 130* 130* 133* 134*  K 3.8 3.3* 3.6 3.3* 3.7  CL 94* 95* 93* 94* 96*  CO2 28 28 28 30 30   GLUCOSE 110* 94 79 96 111*  BUN 9 6* 6* 6* 8  CREATININE 0.59 0.43* 0.46 0.38* 0.88  CALCIUM 8.0* 7.9* 8.1* 8.2* 8.5*  MG 1.6* 1.7 1.8 1.5* 2.2     GFR: Estimated Creatinine Clearance: 60.6 mL/min (by C-G formula based on SCr of 0.88 mg/dL).  Liver Function Tests: Recent Labs  Lab 04/23/21 1633  AST 22  ALT 14  ALKPHOS 114  BILITOT 1.1  PROT 5.5*  ALBUMIN 2.7*     CBG: Recent Labs  Lab 04/23/21 2136  GLUCAP 95      Recent Results (from the past 240 hour(s))  Resp Panel by RT-PCR (Flu A&B, Covid) Nasopharyngeal Swab     Status: None   Collection Time: 04/23/21  5:55 PM   Specimen: Nasopharyngeal Swab; Nasopharyngeal(NP) swabs in vial transport medium  Result Value Ref Range Status   SARS Coronavirus 2 by RT PCR NEGATIVE NEGATIVE Final    Comment: (NOTE) SARS-CoV-2 target nucleic acids are NOT DETECTED.  The SARS-CoV-2 RNA is generally detectable in upper respiratory specimens during the acute phase of infection. The lowest concentration of SARS-CoV-2 viral copies this assay can detect is 138 copies/mL. A negative result does not preclude SARS-Cov-2 infection and should not be used as the sole  basis for treatment or other patient management decisions. A negative result may occur with  improper specimen collection/handling, submission of specimen other than nasopharyngeal swab, presence of viral mutation(s) within the areas targeted by this assay, and inadequate number of viral copies(<138 copies/mL). A negative result must be combined with clinical observations, patient history, and epidemiological information. The expected result is Negative.  Fact Sheet for Patients:  EntrepreneurPulse.com.au  Fact Sheet for Healthcare Providers:  IncredibleEmployment.be  This test is no t yet approved or cleared by the [place] FDA and  has been authorized for detection and/or diagnosis of SARS-CoV-2 by FDA under an Emergency Use Authorization (EUA). This EUA will remain  in effect (meaning this test can be used) for the duration of the COVID-19 declaration under Section 564(b)(1) of the Act, 21 U.S.C.section 360bbb-3(b)(1), unless the authorization is terminated  or revoked sooner.  Influenza A by PCR NEGATIVE NEGATIVE Final   Influenza B by PCR NEGATIVE NEGATIVE Final    Comment: (NOTE) The Xpert Xpress SARS-CoV-2/FLU/RSV plus assay is intended as an aid in the diagnosis of influenza from Nasopharyngeal swab specimens and should not be used as a sole basis for treatment. Nasal washings and aspirates are unacceptable for Xpert Xpress SARS-CoV-2/FLU/RSV testing.  Fact Sheet for Patients: EntrepreneurPulse.com.au  Fact Sheet for Healthcare Providers: IncredibleEmployment.be  This test is not yet approved or cleared by the [place] FDA and has been authorized for detection and/or diagnosis of SARS-CoV-2 by FDA under an Emergency Use Authorization (EUA). This EUA will remain in effect (meaning this test can be used) for the duration of the COVID-19 declaration under Section 564(b)(1) of the Act,  21 U.S.C. section 360bbb-3(b)(1), unless the authorization is terminated or revoked.  Performed at [facility], [place] [address], [place], [place] [address]   Urine Culture     Status: Abnormal   Collection Time: 04/24/21  9:04 AM   Specimen: Urine, Catheterized  Result Value Ref Range Status   Specimen Description   Final    URINE, CATHETERIZED Performed at [place] [address], [place], [place] [address]    Special Requests   Final    NONE Performed at [facility], [place] [address], [place], [place] [address]    Culture (A)  Final    40,000 COLONIES/mL ENTEROCOCCUS FAECIUM VANCOMYCIN RESISTANT ENTEROCOCCUS ISOLATED    Report Status 04/26/2021 FINAL  Final   Organism ID, Bacteria ENTEROCOCCUS FAECIUM (A)  Final      Susceptibility   Enterococcus faecium - MIC*    AMPICILLIN >=32 RESISTANT Resistant     NITROFURANTOIN 128 RESISTANT Resistant     VANCOMYCIN >=32 RESISTANT Resistant     LINEZOLID 2 SENSITIVE Sensitive     * 40,000 COLONIES/mL ENTEROCOCCUS FAECIUM          Radiology Studies: DG CHEST PORT 1 VIEW  Result Date: 04/28/2021 CLINICAL DATA:  Hypoxia EXAM: PORTABLE CHEST 1 VIEW COMPARISON:  Radiograph 04/23/2021 FINDINGS: The patient is slightly rotated. There are overlapping breast prosthesis is limiting evaluation of the right lower lung and peripheral left lung. Unchanged cardiomediastinal silhouette. There is prominent pericardial fat. Increased left basilar opacity. There is no new airspace disease. There is no large pleural effusion. No visible pneumothorax. Bilateral shoulder osteoarthritis. Thoracic spondylosis. No acute osseous abnormality. IMPRESSION: Increased left basilar opacities which could be atelectasis or infection. Electronically Signed   By: Maurine Simmering M.D.   On: 04/28/2021 11:13        Scheduled Meds:  apixaban  5 mg Oral BID   feeding supplement  237 mL Oral BID BM    guaiFENesin  1,200 mg Oral BID   ipratropium-albuterol  3 mL Nebulization BID   [START ON 04/29/2021] isosorbide mononitrate  30 mg Oral Daily   levothyroxine  125 mcg Oral Q0600   loratadine  10 mg Oral Daily   multivitamin with minerals  1 tablet Oral Daily   ofloxacin  1 drop Right Eye BID   pantoprazole  40 mg Oral Daily   polyethylene glycol  17 g Oral Daily   potassium chloride  10 mEq Oral Daily   senna-docusate  1 tablet Oral BID   Vitamin D (Ergocalciferol)  50,000 Units Oral Q7 days   Continuous Infusions:      LOS: 4 days    Time spent: 35 minutes  Irine Seal, MD [facility]   To contact the attending provider between 7A-7P or the covering provider during after hours 7P-7A, please log into the web site [email or web address] and access using universal [place] password for that web site. If you do not have the password, please call the hospital operator.  04/28/2021, 4:14 PM

## 2021-04-28 NOTE — NC FL2 (Signed)
[place] LEVEL OF CARE SCREENING TOOL     IDENTIFICATION  Patient Name: Angela [name] Birthdate: 11-Oct-1932 Sex: female Admission Date (Current Location): 04/23/2021  [place] and [place] Number:  Kathleen Argue [id number] [place] and Address:  [facility],  [place], [place]      Provider Number: [id number]  Attending Physician Name and Address:  Eugenie Filler, MD  Relative Name and Phone Number:  Velna Ochs [place] Son [phone]  [phone]  Jacinto Halim   [phone]  Elige Radon   [phone]    Current Level of Care: Hospital Recommended Level of Care: [place] Prior Approval Number:    Date Approved/Denied:   PASRR Number: [id number]  Discharge Plan: SNF    Current Diagnoses: Patient Active Problem List   Diagnosis Date Noted   History of pulmonary embolus (PE) 04/24/2021   Dehydration 04/24/2021   Chronic back pain 04/24/2021   Immobility 04/24/2021   Community acquired pneumonia 03/22/2021   Cardiomegaly 03/22/2021   Hypokalemia 03/22/2021   Hyperbilirubinemia 03/22/2021   Hypocalcemia 03/22/2021   Hypomagnesemia 03/22/2021   Hypercoagulable state, primary ([place]) 09/03/2020   Need for vaccination 09/03/2020   Ascending aortic aneurysm 01/13/2016   Thoracic aortic atherosclerosis ([place]) 01/13/2016   Pulmonary embolism without acute cor pulmonale (HCC)    Leukocytosis 12/23/2014   BRCA2 positive    Hypothyroidism (acquired) 12/04/2012   Heart murmur 09/01/2012   Hypertension    Dyslipidemia    Prediabetes    Arthritis    GERD (gastroesophageal reflux disease)     Orientation RESPIRATION BLADDER Height & Weight     Self, Place  Normal Incontinent Weight: 250 lb (113.4 kg) Height:  5' 8" (172.7 cm)  BEHAVIORAL SYMPTOMS/MOOD NEUROLOGICAL BOWEL NUTRITION STATUS      Incontinent Diet  AMBULATORY STATUS COMMUNICATION OF NEEDS Skin    Limited Assist Verbally Normal                       Personal Care Assistance Level of Assistance  Bathing, Feeding, Dressing Bathing Assistance: Limited assistance Feeding assistance: Limited assistance Dressing Assistance: Limited assistance     Functional Limitations Info  Sight, Hearing, Speech Sight Info: Adequate Hearing Info: Adequate Speech Info: Adequate    SPECIAL CARE FACTORS FREQUENCY  PT (By licensed PT), OT (By licensed OT)     PT Frequency: Minimum 5x a week OT Frequency: Minimum 5x a week            Contractures Contractures Info: Not present    Additional Factors Info  Code Status, Allergies Code Status Info: Full Code Allergies Info: Crestor (Rosuvastatin Calcium)   Gadolinium   Pravastatin   Simvastatin   Penicillins           Current Medications (04/28/2021):  This is the current hospital active medication list Current Facility-Administered Medications  Medication Dose Route Frequency Provider Last Rate Last Admin   acetaminophen (TYLENOL) tablet 650 mg  650 mg Oral Q6H PRN Etta Quill, DO   650 mg at 04/27/21 2153   Or   acetaminophen (TYLENOL) suppository 650 mg  650 mg Rectal Q6H PRN Etta Quill, DO       acetaminophen (TYLENOL) tablet 162 mg  162 mg Oral Q8H PRN Green, Terri L, RPH   162 mg at 04/28/21 0513   albuterol (PROVENTIL) (2.5 MG/3ML) 0.083% nebulizer solution 2.5 mg  2.5 mg Nebulization Q6H PRN Alcario Drought,  M, DO   2.5 mg at 04/28/21 0808  ° apixaban (ELIQUIS) tablet 5 mg  5 mg Oral BID Gardner, Jared M, DO   5 mg at 04/28/21 1057  ° diclofenac Sodium (VOLTAREN) 1 % topical gel 2 g  2 g Topical QID PRN Thompson, Daniel V, MD   2 g at 04/28/21 0512  ° feeding supplement (ENSURE ENLIVE / ENSURE PLUS) liquid 237 mL  237 mL Oral BID BM Thompson, Daniel V, MD   237 mL at 04/28/21 1133  ° fluticasone (FLONASE) 50 MCG/ACT nasal spray 2 spray  2 spray Each Nare Daily PRN Gardner, Jared M, DO      ° guaiFENesin (MUCINEX) 12 hr  tablet 1,200 mg  1,200 mg Oral BID Thompson, Daniel V, MD   1,200 mg at 04/28/21 1057  ° guaiFENesin-dextromethorphan (ROBITUSSIN DM) 100-10 MG/5ML syrup 5 mL  5 mL Oral Q4H PRN Thompson, Daniel V, MD   5 mL at 04/27/21 2206  ° ipratropium-albuterol (DUONEB) 0.5-2.5 (3) MG/3ML nebulizer solution 3 mL  3 mL Nebulization BID Thompson, Daniel V, MD      ° [START ON 04/29/2021] isosorbide mononitrate (IMDUR) 24 hr tablet 30 mg  30 mg Oral Daily Thompson, Daniel V, MD      ° levothyroxine (SYNTHROID) tablet 125 mcg  125 mcg Oral Q0600 Gardner, Jared M, DO   125 mcg at 04/28/21 0513  ° lidocaine (LIDODERM) 5 % 1 patch  1 patch Transdermal Daily PRN Gardner, Jared M, DO   1 patch at 04/26/21 0952  ° loratadine (CLARITIN) tablet 10 mg  10 mg Oral Daily Gardner, Jared M, DO   10 mg at 04/28/21 1057  ° multivitamin with minerals tablet 1 tablet  1 tablet Oral Daily Thompson, Daniel V, MD   1 tablet at 04/28/21 1057  ° ofloxacin (OCUFLOX) 0.3 % ophthalmic solution 1 drop  1 drop Right Eye BID Gardner, Jared M, DO   1 drop at 04/28/21 1134  ° ondansetron (ZOFRAN) tablet 4 mg  4 mg Oral Q6H PRN Gardner, Jared M, DO   4 mg at 04/26/21 1441  ° Or  ° ondansetron (ZOFRAN) injection 4 mg  4 mg Intravenous Q6H PRN Gardner, Jared M, DO   4 mg at 04/28/21 0120  ° oxyCODONE (Oxy IR/ROXICODONE) immediate release tablet 5 mg  5 mg Oral Q8H PRN Daniels, James K, NP   5 mg at 04/28/21 0513  ° pantoprazole (PROTONIX) EC tablet 40 mg  40 mg Oral Daily Gardner, Jared M, DO   40 mg at 04/28/21 1134  ° polyethylene glycol (MIRALAX / GLYCOLAX) packet 17 g  17 g Oral Daily Gardner, Jared M, DO   17 g at 04/26/21 0937  ° potassium chloride (KLOR-CON M) CR tablet 10 mEq  10 mEq Oral Daily Thompson, Daniel V, MD   10 mEq at 04/28/21 1057  ° senna-docusate (Senokot-S) tablet 1 tablet  1 tablet Oral BID Gardner, Jared M, DO   1 tablet at 04/28/21 1057  ° Vitamin D (Ergocalciferol) (DRISDOL) capsule 50,000 Units  50,000 Units Oral Q7 days Thompson, Daniel  V, MD   50,000 Units at 04/27/21 1140  ° ° ° °Discharge Medications: °Please see discharge summary for a list of discharge medications. ° °Relevant Imaging Results: ° °Relevant Lab Results: ° ° °Additional Information °SSN [id number] ° °Anterhaus, Eric R, LCSW ° ° ° ° °

## 2021-04-28 NOTE — TOC Initial Note (Addendum)
Transition of Care [facility]) - Initial/Assessment Note    Patient Details  Name: Angela [name] MRN: [id number] Date of Birth: September 13, 1932  Transition of [facility]) CM/SW Contact:    Ross Ludwig, LCSW Phone Number: 04/28/2021, 4:41 PM  Clinical Narrative:                 Patient is an 88 year old female who is from home alone, and has caregiver.  She is currently open to [facility] PT, OT, and RN.  Patient was just recently in the hospital and she declined SNF placement at that time.  CSW contacted patient's son Jenny Reichmann [phone], who said patient declined SNF last time, and went home.  While she was at home she would only work with therapy when [facility] came in, she did not do it when they were not around.  Justice Rocher is the caregiver who is only at patient's house 3 hours a day and can not help patient do exercises.  Patient's son would like her to go to SNF, however she was not agreeable last time.  Patient's son is hopeful that she will agree this time.  CSW tried speaking to patient to see if she would be agreeable to SNF placement, but she was sleeping.  Patient's son asked CSW to begin bed search in [facility].    CSW emailed patient's son the bed offers that she has had so he can research them and try to convince her to go.  CSW informed him that someone else from [facility] will be covering the rest of the week.  CSW emailed Lorre Nick who will be the [facility] worker for the rest of the week.copy of the email that was sent to son.  CSW to continue to follow patient's progress throughout discharge planning.  Expected Discharge Plan: Skilled Nursing Facility Barriers to Discharge: Continued Medical Work up   Patient Goals and CMS Choice Patient states their goals for this hospitalization and ongoing recovery are:: To go to SNF then return back home with home health services. CMS Medicare.gov Compare Post Acute Care list provided to:: Patient Represenative (must comment) Choice offered to / list  presented to : Adult Children  Expected Discharge Plan and Services Expected Discharge Plan: [place] arrangements for the past 2 months: Single Family Home                           HH Arranged: PT, OT, RN [facility] Agency: [place] ([facility]) Date HH Agency Contacted: 04/27/21 Time [place]: 1627 Representative spoke with at [place]: Corene Cornea  Prior Living Arrangements/Services Living arrangements for the past 2 months: [place] with:: Self Patient language and need for interpreter reviewed:: Yes Do you feel safe going back to the place where you live?: No   Patient's son feels she needs rehab first before returning back home.  Need for Family Participation in Patient Care: Yes (Comment) Care giver support system in place?: No (comment) Current home services: Home OT, Home PT, Home RN Criminal Activity/Legal Involvement Pertinent to Current Situation/Hospitalization: No - Comment as needed  Activities of Daily Living Home Assistive Devices/Equipment: Walker (specify type), Eyeglasses, Bedside commode/3-in-1 ADL Screening (condition at time of admission) Patient's cognitive ability adequate to safely complete daily activities?: No Is the patient deaf or have difficulty hearing?: Yes Does the patient have difficulty seeing, even when wearing glasses/contacts?: Yes (  pt states she is getting vaccines in both of them) Does the patient have difficulty concentrating, remembering, or making decisions?: No Patient able to express need for assistance with ADLs?: Yes Does the patient have difficulty dressing or bathing?: Yes Independently performs ADLs?: No Communication: Independent Dressing (OT): Needs assistance Is this a change from baseline?: Pre-admission baseline Grooming: Independent Feeding: Independent Bathing: Needs assistance Is this a change from baseline?: Pre-admission baseline Toileting: Needs  assistance Is this a change from baseline?: Pre-admission baseline In/Out Bed: Needs assistance Is this a change from baseline?: Pre-admission baseline Walks in Home: Needs assistance Is this a change from baseline?: Pre-admission baseline Does the patient have difficulty walking or climbing stairs?: Yes Weakness of Legs: None Weakness of Arms/Hands: None  Permission Sought/Granted Permission sought to share information with : Case Manager, Customer service manager, Family Supports Permission granted to share information with : Yes, Release of Information Signed  Share Information with NAME: Velna Ochs [place] Son [phone]  [phone]  Jacinto Halim   [phone]  Elige Radon   [phone]  Permission granted to share info w AGENCY: SNF admissions        Emotional Assessment Appearance:: Appears stated age   Affect (typically observed): Accepting, Appropriate, Calm, Stable Orientation: : Oriented to Self Alcohol / Substance Use: Illicit Drugs Psych Involvement: No (comment)  Admission diagnosis:  Dehydration [E86.0] Hypokalemia [E87.6] Hypomagnesemia [E83.42] Generalized abdominal pain [R10.84] Patient Active Problem List   Diagnosis Date Noted   History of pulmonary embolus (PE) 04/24/2021   Dehydration 04/24/2021   Chronic back pain 04/24/2021   Immobility 04/24/2021   Community acquired pneumonia 03/22/2021   Cardiomegaly 03/22/2021   Hypokalemia 03/22/2021   Hyperbilirubinemia 03/22/2021   Hypocalcemia 03/22/2021   Hypomagnesemia 03/22/2021   Hypercoagulable state, primary ([place]) 09/03/2020   Need for vaccination 09/03/2020   Ascending aortic aneurysm 01/13/2016   Thoracic aortic atherosclerosis ([place]) 01/13/2016   Pulmonary embolism without acute cor pulmonale (HCC)    Leukocytosis 12/23/2014   BRCA2 positive    Hypothyroidism (acquired) 12/04/2012   Heart murmur 09/01/2012   Hypertension    Dyslipidemia     Prediabetes    Arthritis    GERD (gastroesophageal reflux disease)    PCP:  [name], MD Pharmacy:   [place], [place] [address] Phone: [phone] Fax: [phone]     Social Determinants of Health (SDOH) Interventions    Readmission Risk Interventions No flowsheet data found.

## 2021-04-29 DIAGNOSIS — A491 Streptococcal infection, unspecified site: Secondary | ICD-10-CM

## 2021-04-29 DIAGNOSIS — E871 Hypo-osmolality and hyponatremia: Secondary | ICD-10-CM

## 2021-04-29 DIAGNOSIS — I959 Hypotension, unspecified: Secondary | ICD-10-CM

## 2021-04-29 DIAGNOSIS — E860 Dehydration: Secondary | ICD-10-CM | POA: Diagnosis not present

## 2021-04-29 DIAGNOSIS — R0902 Hypoxemia: Secondary | ICD-10-CM

## 2021-04-29 LAB — CBC WITH DIFFERENTIAL/PLATELET
Abs Immature Granulocytes: 0.08 10*3/uL — ABNORMAL HIGH (ref 0.00–0.07)
Basophils Absolute: 0 10*3/uL (ref 0.0–0.1)
Basophils Relative: 0 %
Eosinophils Absolute: 0.4 10*3/uL (ref 0.0–0.5)
Eosinophils Relative: 4 %
HCT: 40.9 % (ref 36.0–46.0)
Hemoglobin: 13.4 g/dL (ref 12.0–15.0)
Immature Granulocytes: 1 %
Lymphocytes Relative: 26 %
Lymphs Abs: 2.4 10*3/uL (ref 0.7–4.0)
MCH: 30.7 pg (ref 26.0–34.0)
MCHC: 32.8 g/dL (ref 30.0–36.0)
MCV: 93.6 fL (ref 80.0–100.0)
Monocytes Absolute: 1.1 10*3/uL — ABNORMAL HIGH (ref 0.1–1.0)
Monocytes Relative: 12 %
Neutro Abs: 5.1 10*3/uL (ref 1.7–7.7)
Neutrophils Relative %: 57 %
Platelets: 234 10*3/uL (ref 150–400)
RBC: 4.37 MIL/uL (ref 3.87–5.11)
RDW: 13.2 % (ref 11.5–15.5)
WBC: 9 10*3/uL (ref 4.0–10.5)
nRBC: 0 % (ref 0.0–0.2)

## 2021-04-29 LAB — BASIC METABOLIC PANEL
Anion gap: 6 (ref 5–15)
BUN: 8 mg/dL (ref 8–23)
CO2: 33 mmol/L — ABNORMAL HIGH (ref 22–32)
Calcium: 8.6 mg/dL — ABNORMAL LOW (ref 8.9–10.3)
Chloride: 95 mmol/L — ABNORMAL LOW (ref 98–111)
Creatinine, Ser: 0.53 mg/dL (ref 0.44–1.00)
GFR, Estimated: >60 mL/min (ref >60)
Glucose, Bld: 95 mg/dL (ref 70–99)
Potassium: 3.4 mmol/L — ABNORMAL LOW (ref 3.5–5.1)
Sodium: 134 mmol/L — ABNORMAL LOW (ref 135–145)

## 2021-04-29 LAB — MAGNESIUM: Magnesium: 2 mg/dL (ref 1.7–2.4)

## 2021-04-29 MED ORDER — POTASSIUM CHLORIDE CRYS ER 20 MEQ PO TBCR
40.0000 meq | EXTENDED_RELEASE_TABLET | Freq: Once | ORAL | Status: AC
  Administered 2021-04-29: 40 meq via ORAL

## 2021-04-29 MED ORDER — MAGNESIUM OXIDE -MG SUPPLEMENT 400 (240 MG) MG PO TABS
200.0000 mg | ORAL_TABLET | Freq: Two times a day (BID) | ORAL | Status: DC
  Administered 2021-04-29 – 2021-04-30 (×2): 200 mg via ORAL
  Filled 2021-04-29 (×2): qty 1

## 2021-04-29 NOTE — Assessment & Plan Note (Signed)
Secondary to hypovolemia.  Improved with IV fluids.

## 2021-04-29 NOTE — Progress Notes (Addendum)
°  Progress Note   Patient: Angela [name] JJO:[id number] DOB: March 29, 1932 DOA: 04/23/2021     5 DOS: the patient was seen and examined on 04/29/2021   Brief hospital course: 88 year old past medical history significant for chronic back pain on Percocet, type 2 diabetes, hypertension presented to the ED with abdominal pain and dysuria, poor mobility and dehydration.  Patient had a recent admission for pneumonia, since then she has not really been ambulatory.  Patient was seen in the ED, CT abdomen and pelvis with no acute finding, age indeterminate compression fracture noted, patient noted to be hypokalemic with a potassium of 2.9, hypomagnesemic with magnesium of 1.1.  Patient was treated with IV fluids, repletion of electrolytes.  PT /OT recommended a skilled nursing facility.  Patient currently awaiting for skilled nursing facility.  Assessment and Plan: * Dehydration- (present on admission) In setting of immobility, poor oral intake.  Received IV fluids.  Improved.   Hypoxia Patient desat to 46, had some wheezing 2/07, Improved with nebulizer, placed on 2 L oxygen.  NSL.  She is improved today.    Vancomycin resistant Enterococcus Vancomycin-resistant Enterococcus UTI: Patient presented with lower abdominal pain, dysuria on admission. Urine culture grew 40,000 colonies of VRE. Patient received 1 dose of fosfomycin. Symptoms has resolved.  Hypotension She had soft low BP likely related to hypovolemia.  Resolved with IV fluids.   Hyponatremia Secondary to hypovolemia.  Improved with IV fluids.    Immobility- (present on admission) Worse since PNA admit last month. PT/OT recommend SNF>  Patient agrees to go to rehab,.   Chronic back pain- (present on admission) Continue Oxycodone PRN.   History of pulmonary embolus (PE)- (present on admission) Continue eliquis.  Hypomagnesemia- (present on admission) Replaced.   Hypokalemia- (present on admission) Replete with 40  meq extra.   Hypothyroidism (acquired)- (present on admission) Continue synthroid.  Hypertension- (present on admission) Hold ACEi-HCTZ given dehydration and hypokalemia.  Continue with Imdur.         Subjective: she is breathing well, she was placed on 2 L oxygen two days ago. Had some wheezing. Nurse will start weaning oxygen off.   Physical Exam: Vitals:   04/28/21 2029 04/29/21 0605 04/29/21 0911 04/29/21 1328  BP: (!) 100/53 (!) 128/59  (!) 123/42  Pulse: 78 75  84  Resp: 18 18  16   Temp: 98.2 F (36.8 C) 97.7 F (36.5 C)  98.4 F (36.9 C)  TempSrc: Oral Oral  Oral  SpO2: 99% 97% 96% 95%  Weight:      Height:      General; obese in no distress.  CVS; S 1, S 2 RRR Lungs CTA  Data Reviewed:  Bmet  and magnesium level of review  Family Communication: care discussed with patient.   Disposition: Status is: Inpatient Remains inpatient appropriate because: Continue correction of electrolyte abnormality, awaiting skilled nursing facility.  Stable for transfer to rehab          Planned Discharge Destination: Skilled nursing facility     Time spent: 45 minutes  Author: Elmarie Shiley, MD 04/29/2021 3:39 PM  For on call review [email or web address].

## 2021-04-29 NOTE — Assessment & Plan Note (Signed)
She had soft low BP likely related to hypovolemia.  Resolved with IV fluids.

## 2021-04-29 NOTE — Hospital Course (Addendum)
88 year old past medical history significant for chronic back pain on Percocet, type 2 diabetes, hypertension presented to the ED with abdominal pain and dysuria, poor mobility and dehydration.  Patient had a recent admission for pneumonia, since then she has not really been ambulatory.  Patient was seen in the ED, CT abdomen and pelvis with no acute finding, age indeterminate compression fracture noted, patient noted to be hypokalemic with a potassium of 2.9, hypomagnesemic with magnesium of 1.1.  Patient was treated with IV fluids, repletion of electrolytes.  PT /OT recommended a skilled nursing facility.   Patient stable to be transfer to SNF>

## 2021-04-29 NOTE — Assessment & Plan Note (Signed)
Vancomycin-resistant Enterococcus UTI: Patient presented with lower abdominal pain, dysuria on admission. Urine culture grew 40,000 colonies of VRE. Patient received 1 dose of fosfomycin. Symptoms has resolved.

## 2021-04-29 NOTE — Assessment & Plan Note (Addendum)
Patient desat to 23, had some wheezing 2/07, Improved with nebulizer, placed on 2 L oxygen.  NSL. Chest x ray increased left basilar atelectasis, infection or atelectasis. She has remain afebrile, no leukocytosis. Order Incentive spirometry, Nebulizer.  She is improved. Requiring 2 L oxygen.

## 2021-04-29 NOTE — TOC Progression Note (Signed)
Transition of Care [facility]) - Progression Note    Patient Details  Name: Tanieka Pownall MRN: [id number] Date of Birth: [birth year]  Transition of Care [facility]) CM/SW Contact  Lennart Pall, LCSW Phone Number: 04/29/2021, 3:16 PM  Clinical Narrative:    Met with pt this morning who confirms she is agreeable with plan for SNF but asks that I follow up with her son on which facility he would want her to admit to.  Left VM this morning and again this afternoon as well as email message for son requesting facility choice - awaiting response and have asked for ASAP.   Expected Discharge Plan: [place] Barriers to Discharge: Continued Medical Work up  Expected Discharge Plan and Services Expected Discharge Plan: [place] arrangements for the past 2 months: Single Family Home                           HH Arranged: PT, OT, RN [place] Agency: [place] ([facility]) Date HH Agency Contacted: 04/27/21 Time [place]: 1627 Representative spoke with at [place]: [place] ([place]) Interventions    Readmission Risk Interventions No flowsheet data found.

## 2021-04-30 DIAGNOSIS — M25562 Pain in left knee: Secondary | ICD-10-CM | POA: Diagnosis not present

## 2021-04-30 DIAGNOSIS — I10 Essential (primary) hypertension: Secondary | ICD-10-CM | POA: Diagnosis not present

## 2021-04-30 DIAGNOSIS — G894 Chronic pain syndrome: Secondary | ICD-10-CM | POA: Diagnosis not present

## 2021-04-30 DIAGNOSIS — R9389 Abnormal findings on diagnostic imaging of other specified body structures: Secondary | ICD-10-CM | POA: Diagnosis not present

## 2021-04-30 DIAGNOSIS — M170 Bilateral primary osteoarthritis of knee: Secondary | ICD-10-CM | POA: Diagnosis not present

## 2021-04-30 DIAGNOSIS — M545 Low back pain, unspecified: Secondary | ICD-10-CM | POA: Diagnosis not present

## 2021-04-30 DIAGNOSIS — E871 Hypo-osmolality and hyponatremia: Secondary | ICD-10-CM | POA: Diagnosis not present

## 2021-04-30 DIAGNOSIS — R0902 Hypoxemia: Secondary | ICD-10-CM | POA: Diagnosis not present

## 2021-04-30 DIAGNOSIS — R600 Localized edema: Secondary | ICD-10-CM | POA: Diagnosis not present

## 2021-04-30 DIAGNOSIS — R110 Nausea: Secondary | ICD-10-CM | POA: Diagnosis not present

## 2021-04-30 DIAGNOSIS — R627 Adult failure to thrive: Secondary | ICD-10-CM | POA: Diagnosis not present

## 2021-04-30 DIAGNOSIS — M7989 Other specified soft tissue disorders: Secondary | ICD-10-CM | POA: Diagnosis not present

## 2021-04-30 DIAGNOSIS — R059 Cough, unspecified: Secondary | ICD-10-CM | POA: Diagnosis not present

## 2021-04-30 DIAGNOSIS — E860 Dehydration: Secondary | ICD-10-CM | POA: Diagnosis not present

## 2021-04-30 DIAGNOSIS — I517 Cardiomegaly: Secondary | ICD-10-CM | POA: Diagnosis not present

## 2021-04-30 DIAGNOSIS — M25561 Pain in right knee: Secondary | ICD-10-CM | POA: Diagnosis not present

## 2021-04-30 DIAGNOSIS — R0602 Shortness of breath: Secondary | ICD-10-CM | POA: Diagnosis not present

## 2021-04-30 DIAGNOSIS — E876 Hypokalemia: Secondary | ICD-10-CM | POA: Diagnosis not present

## 2021-04-30 DIAGNOSIS — E039 Hypothyroidism, unspecified: Secondary | ICD-10-CM | POA: Diagnosis not present

## 2021-04-30 DIAGNOSIS — J189 Pneumonia, unspecified organism: Secondary | ICD-10-CM | POA: Diagnosis not present

## 2021-04-30 DIAGNOSIS — G8929 Other chronic pain: Secondary | ICD-10-CM | POA: Diagnosis not present

## 2021-04-30 DIAGNOSIS — M79641 Pain in right hand: Secondary | ICD-10-CM | POA: Diagnosis not present

## 2021-04-30 DIAGNOSIS — I959 Hypotension, unspecified: Secondary | ICD-10-CM | POA: Diagnosis not present

## 2021-04-30 DIAGNOSIS — R5381 Other malaise: Secondary | ICD-10-CM | POA: Diagnosis not present

## 2021-04-30 DIAGNOSIS — M549 Dorsalgia, unspecified: Secondary | ICD-10-CM | POA: Diagnosis not present

## 2021-04-30 DIAGNOSIS — E119 Type 2 diabetes mellitus without complications: Secondary | ICD-10-CM | POA: Diagnosis not present

## 2021-04-30 DIAGNOSIS — J90 Pleural effusion, not elsewhere classified: Secondary | ICD-10-CM | POA: Diagnosis not present

## 2021-04-30 DIAGNOSIS — Z7401 Bed confinement status: Secondary | ICD-10-CM | POA: Diagnosis not present

## 2021-04-30 DIAGNOSIS — K5900 Constipation, unspecified: Secondary | ICD-10-CM | POA: Diagnosis not present

## 2021-04-30 DIAGNOSIS — M25531 Pain in right wrist: Secondary | ICD-10-CM | POA: Diagnosis not present

## 2021-04-30 LAB — BASIC METABOLIC PANEL
Anion gap: 7 (ref 5–15)
BUN: 9 mg/dL (ref 8–23)
CO2: 32 mmol/L (ref 22–32)
Calcium: 8.4 mg/dL — ABNORMAL LOW (ref 8.9–10.3)
Chloride: 96 mmol/L — ABNORMAL LOW (ref 98–111)
Creatinine, Ser: 0.43 mg/dL — ABNORMAL LOW (ref 0.44–1.00)
GFR, Estimated: >60 mL/min (ref >60)
Glucose, Bld: 91 mg/dL (ref 70–99)
Potassium: 3.4 mmol/L — ABNORMAL LOW (ref 3.5–5.1)
Sodium: 135 mmol/L (ref 135–145)

## 2021-04-30 LAB — RESP PANEL BY RT-PCR (FLU A&B, COVID) ARPGX2
Influenza A by PCR: NEGATIVE
Influenza B by PCR: NEGATIVE
SARS Coronavirus 2 by RT PCR: NEGATIVE

## 2021-04-30 MED ORDER — PERCOCET 10-325 MG PO TABS: 0.5000 | ORAL_TABLET | Freq: Three times a day (TID) | ORAL | 0 refills | Status: AC | PRN

## 2021-04-30 MED ORDER — POTASSIUM CHLORIDE CRYS ER 20 MEQ PO TBCR: 40.0000 meq | EXTENDED_RELEASE_TABLET | Freq: Every day | ORAL | 0 refills | Status: AC

## 2021-04-30 MED ORDER — GUAIFENESIN ER 600 MG PO TB12: 1200.0000 mg | ORAL_TABLET | Freq: Two times a day (BID) | ORAL | 0 refills | Status: DC

## 2021-04-30 MED ORDER — POTASSIUM CHLORIDE CRYS ER 20 MEQ PO TBCR
40.0000 meq | EXTENDED_RELEASE_TABLET | Freq: Every day | ORAL | Status: DC
  Administered 2021-04-30: 40 meq via ORAL
  Filled 2021-04-30: qty 2

## 2021-04-30 MED ORDER — POTASSIUM CHLORIDE CRYS ER 20 MEQ PO TBCR
40.0000 meq | EXTENDED_RELEASE_TABLET | Freq: Once | ORAL | Status: AC
  Administered 2021-04-30: 40 meq via ORAL
  Filled 2021-04-30: qty 2

## 2021-04-30 MED ORDER — IPRATROPIUM-ALBUTEROL 0.5-2.5 (3) MG/3ML IN SOLN: 3.0000 mL | Freq: Two times a day (BID) | RESPIRATORY_TRACT | 0 refills | Status: DC

## 2021-04-30 MED ORDER — ADULT MULTIVITAMIN W/MINERALS CH: 1.0000 | ORAL_TABLET | Freq: Every day | ORAL | 0 refills | Status: DC

## 2021-04-30 MED ORDER — MAGNESIUM OXIDE -MG SUPPLEMENT 400 (240 MG) MG PO TABS: 200.0000 mg | ORAL_TABLET | Freq: Two times a day (BID) | ORAL | 0 refills | Status: DC

## 2021-04-30 MED ORDER — OXYCODONE HCL 5 MG PO TABS: 5.0000 mg | ORAL_TABLET | Freq: Once | ORAL | Status: DC

## 2021-04-30 NOTE — TOC Transition Note (Signed)
Transition of Care [facility]) - CM/SW Discharge Note   Patient Details  Name: Madi Bonfiglio MRN: [id number] Date of Birth: 1932-12-15  Transition of Care [facility]) CM/SW Contact:  Lennart Pall, LCSW Phone Number: 04/30/2021, 12:49 PM   Clinical Narrative:    Pt medically cleared for dc today to SNF and pt/son have accepted bed at [facility].  PTAR called at 12:50pm.  RN to call report to [phone].  No further TOC needs.   Final next level of care: Skilled Nursing Facility Barriers to Discharge: Barriers Resolved   Patient Goals and CMS Choice Patient states their goals for this hospitalization and ongoing recovery are:: To go to SNF then return back home with home health services. CMS Medicare.gov Compare Post Acute Care list provided to:: Patient Represenative (must comment) Choice offered to / list presented to : Adult Children  Discharge Placement   Existing PASRR number confirmed : 04/28/21          Patient chooses bed at: [facility] Patient to be transferred to facility by: [place] Name of family member notified: son Patient and family notified of of transfer: 04/30/21  Discharge Plan and Services                DME Arranged: N/A DME Agency: NA       HH Arranged: NA HH Agency: NA Date HH Agency Contacted: 04/27/21 Time [place]: 1627 Representative spoke with at [place]: [place] ([place]) Interventions     Readmission Risk Interventions No flowsheet data found.

## 2021-04-30 NOTE — Discharge Summary (Signed)
Physician Discharge Summary   Patient: Angela [name] MRN: [id number] DOB: [birth year]  Admit date:     04/23/2021  Discharge date: 04/30/21  Discharge Physician: Elmarie Shiley   PCP: [name], MD   Recommendations at discharge:   Needs Bmet to follow Potassium level. Needs Chest x ray in 6 weeks to document resolution atelectasis/infiltrates.  Needs PT daily. Encourage ambulation.   Discharge Diagnoses: Principal Problem:   Dehydration Active Problems:   Hypertension   Hypothyroidism (acquired)   Hypokalemia   Hypomagnesemia   History of pulmonary embolus (PE)   Chronic back pain   Immobility   Hyponatremia   Hypotension   Vancomycin resistant Enterococcus   Hypoxia  Resolved Problems:   * No resolved hospital problems. [facility] Course: 88 year old past medical history significant for chronic back pain on Percocet, type 2 diabetes, hypertension presented to the ED with abdominal pain and dysuria, poor mobility and dehydration.  Patient had a recent admission for pneumonia, since then she has not really been ambulatory.  Patient was seen in the ED, CT abdomen and pelvis with no acute finding, age indeterminate compression fracture noted, patient noted to be hypokalemic with a potassium of 2.9, hypomagnesemic with magnesium of 1.1.  Patient was treated with IV fluids, repletion of electrolytes.  PT /OT recommended a skilled nursing facility.   Patient stable to be transfer to SNF>   Assessment and Plan: * Dehydration- (present on admission) In setting of immobility, poor oral intake.  Received IV fluids.  Improved.   Hypoxia Patient desat to 64, had some wheezing 2/07, Improved with nebulizer, placed on 2 L oxygen.  NSL. Chest x ray increased left basilar atelectasis, infection or atelectasis. She has remain afebrile, no leukocytosis. Order Incentive spirometry, Nebulizer.  She is improved. Requiring 2 L oxygen.    Vancomycin resistant  Enterococcus Vancomycin-resistant Enterococcus UTI: Patient presented with lower abdominal pain, dysuria on admission. Urine culture grew 40,000 colonies of VRE. Patient received 1 dose of fosfomycin. Symptoms has resolved.  Hypotension She had soft low BP likely related to hypovolemia.  Resolved with IV fluids.   Hyponatremia Secondary to hypovolemia.  Improved with IV fluids.    Immobility- (present on admission) Worse since PNA admit last month. PT/OT recommend SNF>  Patient agrees to go to rehab,.   Chronic back pain- (present on admission) Continue Oxycodone PRN.   History of pulmonary embolus (PE)- (present on admission) Continue eliquis.  Hypomagnesemia- (present on admission) Replaced.   Hypokalemia- (present on admission) Replete with 40 meq extra today. She will be discharge on 40 meq daily. Needs Bmet to follow K level.   Hypothyroidism (acquired)- (present on admission) Continue synthroid.  Hypertension- (present on admission) Continue to hold ACEi-HCTZ given dehydration and hypokalemia.  Continue with Imdur.            Consultants: None Procedures performed: None Disposition: Skilled nursing facility Diet recommendation:  Discharge Diet Orders (From admission, onward)     Start     Ordered   04/30/21 0000  Diet - low sodium heart healthy        04/30/21 1026           Cardiac diet  DISCHARGE MEDICATION: Allergies as of 04/30/2021       Reactions   Crestor [rosuvastatin Calcium] Anaphylaxis, Other (See Comments)   Abdominal discomfort   Gadolinium Nausea And Vomiting    pt states she had nausea after receiving MAGNEVIST for breast imaging  Pravastatin Other (See Comments)   Locked jaw, weakness   Simvastatin Other (See Comments)   Weakness and locked jaw.   Penicillins Swelling, Rash   Patient tolerates amoxicillin Has patient had a PCN reaction causing immediate rash, facial/tongue/throat swelling, SOB or lightheadedness with  hypotension: yes Has patient had a PCN reaction causing severe rash involving mucus membranes or skin necrosis: unknown Has patient had a PCN reaction that required hospitalization: unknown Has patient had a PCN reaction occurring within the last 10 years: no If all of the above answers are "NO", then may proceed with Cephalosporin use.        Medication List     STOP taking these medications    benazepril-hydrochlorthiazide 20-12.5 MG tablet Commonly known as: LOTENSIN HCT   potassium chloride 10 MEQ tablet Commonly known as: KLOR-CON Replaced by: potassium chloride SA 20 MEQ tablet       TAKE these medications    alendronate 70 MG tablet Commonly known as: FOSAMAX TAKE 1 TAB ONCE A WEEK, AT LEAST 30 MIN BEFORE 1ST FOOD.DO NOT LIE DOWN FOR 30 MIN AFTER TAKING.   apixaban 5 MG Tabs tablet Commonly known as: Eliquis Take 1 tablet (5 mg total) by mouth 2 (two) times daily.   diclofenac Sodium 1 % Gel Commonly known as: VOLTAREN Apply 2 g topically 4 (four) times daily as needed (knee pain).   fluticasone 50 MCG/ACT nasal spray Commonly known as: FLONASE Place 2 sprays into both nostrils daily. What changed:  when to take this reasons to take this   guaiFENesin 600 MG 12 hr tablet Commonly known as: MUCINEX Take 2 tablets (1,200 mg total) by mouth 2 (two) times daily.   ipratropium-albuterol 0.5-2.5 (3) MG/3ML Soln Commonly known as: DUONEB Take 3 mLs by nebulization 2 (two) times daily.   isosorbide mononitrate 30 MG 24 hr tablet Commonly known as: IMDUR Take 1 tablet (30 mg total) by mouth daily.   levothyroxine 125 MCG tablet Commonly known as: SYNTHROID Take 1 tablet (125 mcg total) by mouth daily.   lidocaine 5 % Commonly known as: LIDODERM Place 1 patch onto the skin daily as needed for pain.   loratadine 10 MG tablet Commonly known as: CLARITIN Take 1 tablet (10 mg total) by mouth daily.   magnesium oxide 400 (240 Mg) MG tablet Commonly known  as: MAG-OX Take 0.5 tablets (200 mg total) by mouth 2 (two) times daily.   multivitamin with minerals Tabs tablet Take 1 tablet by mouth daily. Start taking on: May 01, 2021   ofloxacin 0.3 % ophthalmic solution Commonly known as: OCUFLOX Place 1 drop into the right eye 2 (two) times daily.   ondansetron 4 MG tablet Commonly known as: ZOFRAN Take 1 tablet (4 mg total) by mouth every 6 (six) hours as needed for nausea. What changed: reasons to take this   pantoprazole 40 MG tablet Commonly known as: PROTONIX Take 1 tablet (40 mg total) by mouth daily.   Percocet 10-325 MG tablet Generic drug: oxyCODONE-acetaminophen Take 0.5 tablets by mouth every 8 (eight) hours as needed for pain.   polyethylene glycol powder 17 GM/SCOOP powder Commonly known as: GLYCOLAX/MIRALAX DISSOLVE ONE CAPFUL (17 GM) IN LIQUID AND DRINK THREE TIMES DAILY AS NEEDED FOR MODERATE CONSTIPATION What changed: See the new instructions.   potassium chloride SA 20 MEQ tablet Commonly known as: KLOR-CON M Take 2 tablets (40 mEq total) by mouth daily for 10 days. Start taking on: May 01, 2021 Replaces: potassium chloride 10  MEQ tablet   senna-docusate 8.6-50 MG tablet Commonly known as: Senokot-S Take 1 tablet by mouth 2 (two) times daily.   sodium chloride 0.65 % Soln nasal spray Commonly known as: OCEAN Place 1 spray into both nostrils daily.   Ventolin HFA 108 (90 Base) MCG/ACT inhaler Generic drug: albuterol Inhale 2 puffs into the lungs 4 (four) times daily as needed for wheezing or shortness of breath. Use 2 puffs 3 times a day x4 days, then 4 times a day as needed. What changed:  when to take this additional instructions   Vitamin D (Ergocalciferol) 1.25 MG (50000 UNIT) Caps capsule Commonly known as: DRISDOL TAKE ONE CAPSULE ONCE A WEEK What changed: See the new instructions.        Contact information for after-discharge care     Destination     [facility] .    Service: Skilled Nursing Contact information: [address] [place] [phone]                     Discharge Exam: Danley Danker Weights   04/24/21 1153  Weight: 113.4 kg  General ; no acute distress.  Lungs; CTA Abdomen; soft , nt  Condition at discharge: stable  The results of significant diagnostics from this hospitalization (including imaging, microbiology, ancillary and laboratory) are listed below for reference.   Imaging Studies: DG Chest 2 View  Result Date: 04/23/2021 CLINICAL DATA:  Malaise. Lower abdominal pain. Painful urination and diarrhea. EXAM: CHEST - 2 VIEW COMPARISON:  03/02/2021. FINDINGS: Exam detail is diminished due to patient's body habitus and patient positioning. Stable cardiomediastinal contours. Asymmetric elevation of the right hemidiaphragm identified. Diffuse pulmonary vascular congestion noted. No superimposed airspace consolidation. IMPRESSION: 1. Diffuse pulmonary vascular congestion. 2. Stable asymmetric elevation of the right hemidiaphragm. Electronically Signed   By: Kerby Moors M.D.   On: 04/23/2021 17:31   CT Abdomen Pelvis W Contrast  Result Date: 04/23/2021 CLINICAL DATA:  Bladder dysfunction. EXAM: CT ABDOMEN AND PELVIS WITH CONTRAST TECHNIQUE: Multidetector CT imaging of the abdomen and pelvis was performed using the standard protocol following bolus administration of intravenous contrast. RADIATION DOSE REDUCTION: This exam was performed according to the departmental dose-optimization program which includes automated exposure control, adjustment of the mA and/or kV according to patient size and/or use of iterative reconstruction technique. CONTRAST:  116mL OMNIPAQUE IOHEXOL 300 MG/ML  SOLN COMPARISON:  05/21/2020. FINDINGS: Lower chest: The heart is enlarged. Atelectasis is present at the lung bases. Hepatobiliary: Fatty infiltration of the liver is noted. No focal abnormality is identified. The gallbladder is  not visualized on exam. No biliary ductal dilatation. Pancreas: Pancreatic atrophy is noted. No ductal dilatation or surrounding inflammatory changes. Spleen: Normal in size without focal abnormality. Adrenals/Urinary Tract: The adrenal glands are within normal limits. No renal calculus or hydronephrosis. There is a subcentimeter hypodensity in the upper pole of the left kidney, likely cyst. A small amount of air is noted in the urinary bladder which may be iatrogenic. Stomach/Bowel: The stomach is within normal limits. No bowel obstruction, free air, or pneumatosis. A few scattered diverticula are present along the colon without evidence of diverticulitis. There is fatty infiltration of the walls of the ascending and transverse colon suggesting chronic inflammatory changes. The appendix is not visualized on exam. Vascular/Lymphatic: Aortic atherosclerosis. No enlarged abdominal or pelvic lymph nodes. Reproductive: Status post hysterectomy. No adnexal masses. Other: Small fat containing umbilical hernia. No ascites. A left breast implant is noted.  Bilateral breast implants are noted. Musculoskeletal: There are compression deformities at T10, T12, L1, L2, and L5. Vertebroplasty changes are noted at T12. The compression deformity L2 is new from 2020. Multilevel degenerative changes are noted in the thoracic spine. IMPRESSION: 1. Small focus of air in the urinary bladder which may be iatrogenic. No bladder wall thickening or obstructive uropathy. 2. Multilevel compression deformities in the thoracolumbar spine. The compression deformity L2 is new from 2020, however indeterminate in age. 3. Aortic atherosclerosis. 4. Cardiomegaly. 5. Remaining findings as described above. Electronically Signed   By: Brett Fairy M.D.   On: 04/23/2021 20:23   DG CHEST PORT 1 VIEW  Result Date: 04/28/2021 CLINICAL DATA:  Hypoxia EXAM: PORTABLE CHEST 1 VIEW COMPARISON:  Radiograph 04/23/2021 FINDINGS: The patient is slightly rotated.  There are overlapping breast prosthesis is limiting evaluation of the right lower lung and peripheral left lung. Unchanged cardiomediastinal silhouette. There is prominent pericardial fat. Increased left basilar opacity. There is no new airspace disease. There is no large pleural effusion. No visible pneumothorax. Bilateral shoulder osteoarthritis. Thoracic spondylosis. No acute osseous abnormality. IMPRESSION: Increased left basilar opacities which could be atelectasis or infection. Electronically Signed   By: Maurine Simmering M.D.   On: 04/28/2021 11:13    Microbiology: Results for orders placed or performed during the hospital encounter of 04/23/21  Resp Panel by RT-PCR (Flu A&B, Covid) Nasopharyngeal Swab     Status: None   Collection Time: 04/23/21  5:55 PM   Specimen: Nasopharyngeal Swab; Nasopharyngeal(NP) swabs in vial transport medium  Result Value Ref Range Status   SARS Coronavirus 2 by RT PCR NEGATIVE NEGATIVE Final    Comment: (NOTE) SARS-CoV-2 target nucleic acids are NOT DETECTED.  The SARS-CoV-2 RNA is generally detectable in upper respiratory specimens during the acute phase of infection. The lowest concentration of SARS-CoV-2 viral copies this assay can detect is 138 copies/mL. A negative result does not preclude SARS-Cov-2 infection and should not be used as the sole basis for treatment or other patient management decisions. A negative result may occur with  improper specimen collection/handling, submission of specimen other than nasopharyngeal swab, presence of viral mutation(s) within the areas targeted by this assay, and inadequate number of viral copies(<138 copies/mL). A negative result must be combined with clinical observations, patient history, and epidemiological information. The expected result is Negative.  Fact Sheet for Patients:  EntrepreneurPulse.com.au  Fact Sheet for Healthcare Providers:  IncredibleEmployment.be  This  test is no t yet approved or cleared by the [place] FDA and  has been authorized for detection and/or diagnosis of SARS-CoV-2 by FDA under an Emergency Use Authorization (EUA). This EUA will remain  in effect (meaning this test can be used) for the duration of the COVID-19 declaration under Section 564(b)(1) of the Act, 21 U.S.C.section 360bbb-3(b)(1), unless the authorization is terminated  or revoked sooner.       Influenza A by PCR NEGATIVE NEGATIVE Final   Influenza B by PCR NEGATIVE NEGATIVE Final    Comment: (NOTE) The Xpert Xpress SARS-CoV-2/FLU/RSV plus assay is intended as an aid in the diagnosis of influenza from Nasopharyngeal swab specimens and should not be used as a sole basis for treatment. Nasal washings and aspirates are unacceptable for Xpert Xpress SARS-CoV-2/FLU/RSV testing.  Fact Sheet for Patients: EntrepreneurPulse.com.au  Fact Sheet for Healthcare Providers: IncredibleEmployment.be  This test is not yet approved or cleared by the [place] FDA and has been authorized for detection and/or diagnosis of SARS-CoV-2 by FDA  under an Emergency Use Authorization (EUA). This EUA will remain in effect (meaning this test can be used) for the duration of the COVID-19 declaration under Section 564(b)(1) of the Act, 21 U.S.C. section 360bbb-3(b)(1), unless the authorization is terminated or revoked.  Performed at [facility], [place] [address] [place], [place] [address]   Urine Culture     Status: Abnormal   Collection Time: 04/24/21  9:04 AM   Specimen: Urine, Catheterized  Result Value Ref Range Status   Specimen Description   Final    URINE, CATHETERIZED Performed at [place] [address] [place], [place] [address]    Special Requests   Final    NONE Performed at [facility], [place] [address], [place], [place] [address]    Culture (A)  Final     40,000 COLONIES/mL ENTEROCOCCUS FAECIUM VANCOMYCIN RESISTANT ENTEROCOCCUS ISOLATED    Report Status 04/26/2021 FINAL  Final   Organism ID, Bacteria ENTEROCOCCUS FAECIUM (A)  Final      Susceptibility   Enterococcus faecium - MIC*    AMPICILLIN >=32 RESISTANT Resistant     NITROFURANTOIN 128 RESISTANT Resistant     VANCOMYCIN >=32 RESISTANT Resistant     LINEZOLID 2 SENSITIVE Sensitive     * 40,000 COLONIES/mL ENTEROCOCCUS FAECIUM    Labs: CBC: Recent Labs  Lab 04/23/21 1633 04/24/21 0420 04/25/21 0758 04/26/21 0640 04/27/21 0540 04/29/21 0530  WBC 14.4* 11.9* 9.7 9.3 8.9 9.0  NEUTROABS 10.0*  --  6.4  --   --  5.1  HGB 14.1 14.3 13.5 13.2 13.9 13.4  HCT 42.1 44.0 40.3 40.3 40.5 40.9  MCV 92.1 92.8 92.2 92.2 90.4 93.6  PLT 243 239 225 227 253 914   Basic Metabolic Panel: Recent Labs  Lab 04/25/21 0758 04/26/21 0640 04/27/21 0540 04/28/21 0544 04/29/21 0530 04/30/21 0459  NA 130* 130* 133* 134* 134* 135  K 3.3* 3.6 3.3* 3.7 3.4* 3.4*  CL 95* 93* 94* 96* 95* 96*  CO2 28 28 30 30  33* 32  GLUCOSE 94 79 96 111* 95 91  BUN 6* 6* 6* 8 8 9   CREATININE 0.43* 0.46 0.38* 0.88 0.53 0.43*  CALCIUM 7.9* 8.1* 8.2* 8.5* 8.6* 8.4*  MG 1.7 1.8 1.5* 2.2 2.0  --    Liver Function Tests: Recent Labs  Lab 04/23/21 1633  AST 22  ALT 14  ALKPHOS 114  BILITOT 1.1  PROT 5.5*  ALBUMIN 2.7*   CBG: Recent Labs  Lab 04/23/21 2136  GLUCAP 95    Discharge time spent: greater than 30 minutes.  Signed: Elmarie Shiley, MD [facility] 04/30/2021

## 2021-05-01 ENCOUNTER — Other Ambulatory Visit: Payer: Self-pay | Admitting: Internal Medicine

## 2021-05-01 ENCOUNTER — Other Ambulatory Visit: Payer: Self-pay | Admitting: Endocrinology

## 2021-05-01 DIAGNOSIS — E039 Hypothyroidism, unspecified: Secondary | ICD-10-CM

## 2021-05-04 DIAGNOSIS — R600 Localized edema: Secondary | ICD-10-CM | POA: Diagnosis not present

## 2021-05-05 ENCOUNTER — Other Ambulatory Visit: Payer: Medicare Other

## 2021-05-05 ENCOUNTER — Inpatient Hospital Stay: Payer: Medicare Other | Attending: Oncology | Admitting: Oncology

## 2021-05-05 ENCOUNTER — Inpatient Hospital Stay: Payer: Medicare Other

## 2021-05-05 ENCOUNTER — Ambulatory Visit: Payer: Medicare Other | Admitting: Oncology

## 2021-05-06 ENCOUNTER — Telehealth: Payer: Self-pay | Admitting: Oncology

## 2021-05-06 NOTE — Telephone Encounter (Signed)
Attempted to contact patient per schedule message 2/15, no answer and voicemail was full so unable to leave voicemail.

## 2021-05-11 ENCOUNTER — Telehealth: Payer: Self-pay | Admitting: Pharmacy Technician

## 2021-05-11 ENCOUNTER — Other Ambulatory Visit (HOSPITAL_COMMUNITY): Payer: Self-pay

## 2021-05-11 DIAGNOSIS — E039 Hypothyroidism, unspecified: Secondary | ICD-10-CM

## 2021-05-11 MED ORDER — LEVOTHYROXINE SODIUM 125 MCG PO TABS: 125.0000 ug | ORAL_TABLET | Freq: Every day | ORAL | 3 refills | Status: DC

## 2021-05-11 NOTE — Telephone Encounter (Signed)
Patient Advocate Encounter  Received notification from [facility] that prior authorization for SYNTHROID [id number] is required.   PA submitted on 2.20.23 Key [id number]  RESULTS:  Message from [employer]: Drug is not covered by [facility] will continue to follow  Luciano Cutter, CPhT Patient [facility] Phone: [phone] Fax:  [phone]

## 2021-05-26 DIAGNOSIS — M25562 Pain in left knee: Secondary | ICD-10-CM | POA: Diagnosis not present

## 2021-05-26 DIAGNOSIS — G8929 Other chronic pain: Secondary | ICD-10-CM | POA: Diagnosis not present

## 2021-05-26 DIAGNOSIS — M25561 Pain in right knee: Secondary | ICD-10-CM | POA: Diagnosis not present

## 2021-05-26 DIAGNOSIS — M170 Bilateral primary osteoarthritis of knee: Secondary | ICD-10-CM | POA: Diagnosis not present

## 2021-06-04 DIAGNOSIS — E119 Type 2 diabetes mellitus without complications: Secondary | ICD-10-CM | POA: Diagnosis not present

## 2021-06-04 DIAGNOSIS — E039 Hypothyroidism, unspecified: Secondary | ICD-10-CM | POA: Diagnosis not present

## 2021-06-04 DIAGNOSIS — I10 Essential (primary) hypertension: Secondary | ICD-10-CM | POA: Diagnosis not present

## 2021-06-04 DIAGNOSIS — J189 Pneumonia, unspecified organism: Secondary | ICD-10-CM | POA: Diagnosis not present

## 2021-06-04 DIAGNOSIS — R627 Adult failure to thrive: Secondary | ICD-10-CM | POA: Diagnosis not present

## 2021-06-08 DIAGNOSIS — G894 Chronic pain syndrome: Secondary | ICD-10-CM | POA: Diagnosis not present

## 2021-06-15 DIAGNOSIS — E876 Hypokalemia: Secondary | ICD-10-CM | POA: Diagnosis not present

## 2021-06-15 DIAGNOSIS — K5900 Constipation, unspecified: Secondary | ICD-10-CM | POA: Diagnosis not present

## 2021-06-15 DIAGNOSIS — R600 Localized edema: Secondary | ICD-10-CM | POA: Diagnosis not present

## 2021-06-15 DIAGNOSIS — R0602 Shortness of breath: Secondary | ICD-10-CM | POA: Diagnosis not present

## 2021-06-18 ENCOUNTER — Inpatient Hospital Stay: Payer: Medicare Other | Admitting: Oncology

## 2021-06-23 ENCOUNTER — Telehealth: Payer: Self-pay | Admitting: Internal Medicine

## 2021-06-23 DIAGNOSIS — N281 Cyst of kidney, acquired: Secondary | ICD-10-CM | POA: Diagnosis not present

## 2021-06-23 DIAGNOSIS — E278 Other specified disorders of adrenal gland: Secondary | ICD-10-CM | POA: Diagnosis not present

## 2021-06-23 DIAGNOSIS — H538 Other visual disturbances: Secondary | ICD-10-CM | POA: Diagnosis not present

## 2021-06-23 DIAGNOSIS — E876 Hypokalemia: Secondary | ICD-10-CM | POA: Diagnosis not present

## 2021-06-23 DIAGNOSIS — Z7901 Long term (current) use of anticoagulants: Secondary | ICD-10-CM | POA: Diagnosis not present

## 2021-06-23 DIAGNOSIS — R6520 Severe sepsis without septic shock: Secondary | ICD-10-CM | POA: Diagnosis not present

## 2021-06-23 DIAGNOSIS — R109 Unspecified abdominal pain: Secondary | ICD-10-CM | POA: Diagnosis not present

## 2021-06-23 DIAGNOSIS — A419 Sepsis, unspecified organism: Secondary | ICD-10-CM | POA: Diagnosis not present

## 2021-06-23 DIAGNOSIS — Z743 Need for continuous supervision: Secondary | ICD-10-CM | POA: Diagnosis not present

## 2021-06-23 DIAGNOSIS — R4182 Altered mental status, unspecified: Secondary | ICD-10-CM | POA: Diagnosis not present

## 2021-06-23 DIAGNOSIS — R0902 Hypoxemia: Secondary | ICD-10-CM | POA: Diagnosis not present

## 2021-06-23 DIAGNOSIS — I10 Essential (primary) hypertension: Secondary | ICD-10-CM | POA: Diagnosis not present

## 2021-06-23 DIAGNOSIS — Z20822 Contact with and (suspected) exposure to covid-19: Secondary | ICD-10-CM | POA: Diagnosis not present

## 2021-06-23 DIAGNOSIS — I517 Cardiomegaly: Secondary | ICD-10-CM | POA: Diagnosis not present

## 2021-06-23 DIAGNOSIS — R0689 Other abnormalities of breathing: Secondary | ICD-10-CM | POA: Diagnosis not present

## 2021-06-23 DIAGNOSIS — Z86711 Personal history of pulmonary embolism: Secondary | ICD-10-CM | POA: Diagnosis not present

## 2021-06-23 DIAGNOSIS — R531 Weakness: Secondary | ICD-10-CM | POA: Diagnosis not present

## 2021-06-23 DIAGNOSIS — R059 Cough, unspecified: Secondary | ICD-10-CM | POA: Diagnosis not present

## 2021-06-23 DIAGNOSIS — Z853 Personal history of malignant neoplasm of breast: Secondary | ICD-10-CM | POA: Diagnosis not present

## 2021-06-23 DIAGNOSIS — K760 Fatty (change of) liver, not elsewhere classified: Secondary | ICD-10-CM | POA: Diagnosis not present

## 2021-06-23 DIAGNOSIS — N390 Urinary tract infection, site not specified: Secondary | ICD-10-CM | POA: Diagnosis not present

## 2021-06-23 DIAGNOSIS — I959 Hypotension, unspecified: Secondary | ICD-10-CM | POA: Diagnosis not present

## 2021-06-23 DIAGNOSIS — J189 Pneumonia, unspecified organism: Secondary | ICD-10-CM | POA: Diagnosis not present

## 2021-06-23 DIAGNOSIS — G9341 Metabolic encephalopathy: Secondary | ICD-10-CM | POA: Diagnosis not present

## 2021-06-23 DIAGNOSIS — R0603 Acute respiratory distress: Secondary | ICD-10-CM | POA: Diagnosis not present

## 2021-06-23 DIAGNOSIS — M7989 Other specified soft tissue disorders: Secondary | ICD-10-CM | POA: Diagnosis not present

## 2021-06-23 DIAGNOSIS — J90 Pleural effusion, not elsewhere classified: Secondary | ICD-10-CM | POA: Diagnosis not present

## 2021-06-23 DIAGNOSIS — E039 Hypothyroidism, unspecified: Secondary | ICD-10-CM | POA: Diagnosis not present

## 2021-06-23 DIAGNOSIS — M79661 Pain in right lower leg: Secondary | ICD-10-CM | POA: Diagnosis not present

## 2021-06-23 DIAGNOSIS — R3912 Poor urinary stream: Secondary | ICD-10-CM | POA: Diagnosis not present

## 2021-06-23 DIAGNOSIS — K219 Gastro-esophageal reflux disease without esophagitis: Secondary | ICD-10-CM | POA: Diagnosis not present

## 2021-06-23 DIAGNOSIS — K3189 Other diseases of stomach and duodenum: Secondary | ICD-10-CM | POA: Diagnosis not present

## 2021-06-23 DIAGNOSIS — B348 Other viral infections of unspecified site: Secondary | ICD-10-CM | POA: Diagnosis not present

## 2021-06-23 DIAGNOSIS — J449 Chronic obstructive pulmonary disease, unspecified: Secondary | ICD-10-CM | POA: Diagnosis not present

## 2021-06-23 DIAGNOSIS — E861 Hypovolemia: Secondary | ICD-10-CM | POA: Diagnosis not present

## 2021-06-23 DIAGNOSIS — J918 Pleural effusion in other conditions classified elsewhere: Secondary | ICD-10-CM | POA: Diagnosis not present

## 2021-06-23 DIAGNOSIS — J441 Chronic obstructive pulmonary disease with (acute) exacerbation: Secondary | ICD-10-CM | POA: Diagnosis not present

## 2021-06-23 DIAGNOSIS — Z9049 Acquired absence of other specified parts of digestive tract: Secondary | ICD-10-CM | POA: Diagnosis not present

## 2021-06-23 DIAGNOSIS — J1289 Other viral pneumonia: Secondary | ICD-10-CM | POA: Diagnosis not present

## 2021-06-23 DIAGNOSIS — R000 Tachycardia, unspecified: Secondary | ICD-10-CM | POA: Diagnosis not present

## 2021-06-23 DIAGNOSIS — R945 Abnormal results of liver function studies: Secondary | ICD-10-CM | POA: Diagnosis not present

## 2021-06-23 DIAGNOSIS — J9611 Chronic respiratory failure with hypoxia: Secondary | ICD-10-CM | POA: Diagnosis not present

## 2021-06-23 DIAGNOSIS — B952 Enterococcus as the cause of diseases classified elsewhere: Secondary | ICD-10-CM | POA: Diagnosis not present

## 2021-06-23 DIAGNOSIS — E119 Type 2 diabetes mellitus without complications: Secondary | ICD-10-CM | POA: Diagnosis not present

## 2021-06-23 DIAGNOSIS — E860 Dehydration: Secondary | ICD-10-CM | POA: Diagnosis not present

## 2021-06-23 DIAGNOSIS — M79662 Pain in left lower leg: Secondary | ICD-10-CM | POA: Diagnosis not present

## 2021-06-23 DIAGNOSIS — J440 Chronic obstructive pulmonary disease with acute lower respiratory infection: Secondary | ICD-10-CM | POA: Diagnosis not present

## 2021-06-23 DIAGNOSIS — Z1621 Resistance to vancomycin: Secondary | ICD-10-CM | POA: Diagnosis not present

## 2021-06-23 DIAGNOSIS — E785 Hyperlipidemia, unspecified: Secondary | ICD-10-CM | POA: Diagnosis not present

## 2021-06-23 DIAGNOSIS — R9431 Abnormal electrocardiogram [ECG] [EKG]: Secondary | ICD-10-CM | POA: Diagnosis not present

## 2021-06-23 NOTE — Telephone Encounter (Signed)
LVM for pt to rtn my call to schedule AWV with [facility]. Please schedule AWV if pt calls the office  

## 2021-06-24 DIAGNOSIS — K3189 Other diseases of stomach and duodenum: Secondary | ICD-10-CM | POA: Diagnosis not present

## 2021-06-24 DIAGNOSIS — K760 Fatty (change of) liver, not elsewhere classified: Secondary | ICD-10-CM | POA: Diagnosis not present

## 2021-06-24 DIAGNOSIS — E278 Other specified disorders of adrenal gland: Secondary | ICD-10-CM | POA: Diagnosis not present

## 2021-06-24 DIAGNOSIS — R9431 Abnormal electrocardiogram [ECG] [EKG]: Secondary | ICD-10-CM | POA: Diagnosis not present

## 2021-06-24 DIAGNOSIS — R109 Unspecified abdominal pain: Secondary | ICD-10-CM | POA: Diagnosis not present

## 2021-06-25 ENCOUNTER — Inpatient Hospital Stay: Payer: Medicare Other | Attending: Oncology | Admitting: Oncology

## 2021-06-25 DIAGNOSIS — N281 Cyst of kidney, acquired: Secondary | ICD-10-CM | POA: Diagnosis not present

## 2021-06-26 DIAGNOSIS — M79661 Pain in right lower leg: Secondary | ICD-10-CM | POA: Diagnosis not present

## 2021-06-26 DIAGNOSIS — R0603 Acute respiratory distress: Secondary | ICD-10-CM | POA: Diagnosis not present

## 2021-06-26 DIAGNOSIS — I517 Cardiomegaly: Secondary | ICD-10-CM | POA: Diagnosis not present

## 2021-06-26 DIAGNOSIS — M79662 Pain in left lower leg: Secondary | ICD-10-CM | POA: Diagnosis not present

## 2021-06-26 DIAGNOSIS — M7989 Other specified soft tissue disorders: Secondary | ICD-10-CM | POA: Diagnosis not present

## 2021-06-28 DIAGNOSIS — R059 Cough, unspecified: Secondary | ICD-10-CM | POA: Diagnosis not present

## 2021-06-30 DIAGNOSIS — A419 Sepsis, unspecified organism: Secondary | ICD-10-CM | POA: Diagnosis not present

## 2021-06-30 DIAGNOSIS — R600 Localized edema: Secondary | ICD-10-CM | POA: Diagnosis not present

## 2021-06-30 DIAGNOSIS — E785 Hyperlipidemia, unspecified: Secondary | ICD-10-CM | POA: Diagnosis not present

## 2021-06-30 DIAGNOSIS — Z743 Need for continuous supervision: Secondary | ICD-10-CM | POA: Diagnosis not present

## 2021-06-30 DIAGNOSIS — J441 Chronic obstructive pulmonary disease with (acute) exacerbation: Secondary | ICD-10-CM | POA: Diagnosis not present

## 2021-06-30 DIAGNOSIS — R0603 Acute respiratory distress: Secondary | ICD-10-CM | POA: Diagnosis not present

## 2021-06-30 DIAGNOSIS — Z86711 Personal history of pulmonary embolism: Secondary | ICD-10-CM | POA: Diagnosis not present

## 2021-06-30 DIAGNOSIS — E861 Hypovolemia: Secondary | ICD-10-CM | POA: Diagnosis not present

## 2021-06-30 DIAGNOSIS — J449 Chronic obstructive pulmonary disease, unspecified: Secondary | ICD-10-CM | POA: Diagnosis not present

## 2021-06-30 DIAGNOSIS — H538 Other visual disturbances: Secondary | ICD-10-CM | POA: Diagnosis not present

## 2021-06-30 DIAGNOSIS — J90 Pleural effusion, not elsewhere classified: Secondary | ICD-10-CM | POA: Diagnosis not present

## 2021-06-30 DIAGNOSIS — R112 Nausea with vomiting, unspecified: Secondary | ICD-10-CM | POA: Diagnosis not present

## 2021-06-30 DIAGNOSIS — R3912 Poor urinary stream: Secondary | ICD-10-CM | POA: Diagnosis not present

## 2021-06-30 DIAGNOSIS — J9611 Chronic respiratory failure with hypoxia: Secondary | ICD-10-CM | POA: Diagnosis not present

## 2021-06-30 DIAGNOSIS — B348 Other viral infections of unspecified site: Secondary | ICD-10-CM | POA: Diagnosis not present

## 2021-06-30 DIAGNOSIS — M810 Age-related osteoporosis without current pathological fracture: Secondary | ICD-10-CM | POA: Diagnosis not present

## 2021-06-30 DIAGNOSIS — G9341 Metabolic encephalopathy: Secondary | ICD-10-CM | POA: Diagnosis not present

## 2021-06-30 DIAGNOSIS — E119 Type 2 diabetes mellitus without complications: Secondary | ICD-10-CM | POA: Diagnosis not present

## 2021-06-30 DIAGNOSIS — R059 Cough, unspecified: Secondary | ICD-10-CM | POA: Diagnosis not present

## 2021-06-30 DIAGNOSIS — J189 Pneumonia, unspecified organism: Secondary | ICD-10-CM | POA: Diagnosis not present

## 2021-06-30 DIAGNOSIS — R627 Adult failure to thrive: Secondary | ICD-10-CM | POA: Diagnosis not present

## 2021-06-30 DIAGNOSIS — Z7901 Long term (current) use of anticoagulants: Secondary | ICD-10-CM | POA: Diagnosis not present

## 2021-06-30 DIAGNOSIS — E039 Hypothyroidism, unspecified: Secondary | ICD-10-CM | POA: Diagnosis not present

## 2021-06-30 DIAGNOSIS — M545 Low back pain, unspecified: Secondary | ICD-10-CM | POA: Diagnosis not present

## 2021-06-30 DIAGNOSIS — G894 Chronic pain syndrome: Secondary | ICD-10-CM | POA: Diagnosis not present

## 2021-06-30 DIAGNOSIS — R531 Weakness: Secondary | ICD-10-CM | POA: Diagnosis not present

## 2021-06-30 DIAGNOSIS — I10 Essential (primary) hypertension: Secondary | ICD-10-CM | POA: Diagnosis not present

## 2021-06-30 DIAGNOSIS — N390 Urinary tract infection, site not specified: Secondary | ICD-10-CM | POA: Diagnosis not present

## 2021-06-30 DIAGNOSIS — I959 Hypotension, unspecified: Secondary | ICD-10-CM | POA: Diagnosis not present

## 2021-06-30 DIAGNOSIS — I2699 Other pulmonary embolism without acute cor pulmonale: Secondary | ICD-10-CM | POA: Diagnosis not present

## 2021-07-01 DIAGNOSIS — I2699 Other pulmonary embolism without acute cor pulmonale: Secondary | ICD-10-CM | POA: Diagnosis not present

## 2021-07-01 DIAGNOSIS — M810 Age-related osteoporosis without current pathological fracture: Secondary | ICD-10-CM | POA: Diagnosis not present

## 2021-07-01 DIAGNOSIS — J449 Chronic obstructive pulmonary disease, unspecified: Secondary | ICD-10-CM | POA: Diagnosis not present

## 2021-07-01 DIAGNOSIS — E039 Hypothyroidism, unspecified: Secondary | ICD-10-CM | POA: Diagnosis not present

## 2021-07-02 DIAGNOSIS — I2699 Other pulmonary embolism without acute cor pulmonale: Secondary | ICD-10-CM | POA: Diagnosis not present

## 2021-07-02 DIAGNOSIS — E119 Type 2 diabetes mellitus without complications: Secondary | ICD-10-CM | POA: Diagnosis not present

## 2021-07-02 DIAGNOSIS — J441 Chronic obstructive pulmonary disease with (acute) exacerbation: Secondary | ICD-10-CM | POA: Diagnosis not present

## 2021-07-02 DIAGNOSIS — M545 Low back pain, unspecified: Secondary | ICD-10-CM | POA: Diagnosis not present

## 2021-07-02 DIAGNOSIS — N390 Urinary tract infection, site not specified: Secondary | ICD-10-CM | POA: Diagnosis not present

## 2021-07-02 NOTE — Telephone Encounter (Signed)
Pt son called in and states mother is currently in long term rehab and is not sure when she will be able to come into visit.  ?

## 2021-07-08 DIAGNOSIS — G894 Chronic pain syndrome: Secondary | ICD-10-CM | POA: Diagnosis not present

## 2021-07-09 DIAGNOSIS — R600 Localized edema: Secondary | ICD-10-CM | POA: Diagnosis not present

## 2021-07-09 DIAGNOSIS — J189 Pneumonia, unspecified organism: Secondary | ICD-10-CM | POA: Diagnosis not present

## 2021-07-09 DIAGNOSIS — J90 Pleural effusion, not elsewhere classified: Secondary | ICD-10-CM | POA: Diagnosis not present

## 2021-07-09 DIAGNOSIS — I10 Essential (primary) hypertension: Secondary | ICD-10-CM | POA: Diagnosis not present

## 2021-07-09 DIAGNOSIS — E119 Type 2 diabetes mellitus without complications: Secondary | ICD-10-CM | POA: Diagnosis not present

## 2021-07-09 DIAGNOSIS — R059 Cough, unspecified: Secondary | ICD-10-CM | POA: Diagnosis not present

## 2021-07-14 ENCOUNTER — Ambulatory Visit: Payer: Medicare Other | Admitting: Endocrinology

## 2021-07-17 DIAGNOSIS — I2699 Other pulmonary embolism without acute cor pulmonale: Secondary | ICD-10-CM | POA: Diagnosis not present

## 2021-07-17 DIAGNOSIS — J189 Pneumonia, unspecified organism: Secondary | ICD-10-CM | POA: Diagnosis not present

## 2021-07-17 DIAGNOSIS — J449 Chronic obstructive pulmonary disease, unspecified: Secondary | ICD-10-CM | POA: Diagnosis not present

## 2021-07-22 DIAGNOSIS — I2699 Other pulmonary embolism without acute cor pulmonale: Secondary | ICD-10-CM | POA: Diagnosis not present

## 2021-07-22 DIAGNOSIS — E039 Hypothyroidism, unspecified: Secondary | ICD-10-CM | POA: Diagnosis not present

## 2021-07-22 DIAGNOSIS — R627 Adult failure to thrive: Secondary | ICD-10-CM | POA: Diagnosis not present

## 2021-07-22 DIAGNOSIS — R112 Nausea with vomiting, unspecified: Secondary | ICD-10-CM | POA: Diagnosis not present

## 2021-07-22 DIAGNOSIS — J449 Chronic obstructive pulmonary disease, unspecified: Secondary | ICD-10-CM | POA: Diagnosis not present

## 2021-07-22 DIAGNOSIS — J189 Pneumonia, unspecified organism: Secondary | ICD-10-CM | POA: Diagnosis not present

## 2021-07-23 DIAGNOSIS — R112 Nausea with vomiting, unspecified: Secondary | ICD-10-CM | POA: Diagnosis not present

## 2021-07-29 DIAGNOSIS — L853 Xerosis cutis: Secondary | ICD-10-CM | POA: Diagnosis not present

## 2021-07-29 DIAGNOSIS — I70203 Unspecified atherosclerosis of native arteries of extremities, bilateral legs: Secondary | ICD-10-CM | POA: Diagnosis not present

## 2021-07-29 DIAGNOSIS — R627 Adult failure to thrive: Secondary | ICD-10-CM | POA: Diagnosis not present

## 2021-07-29 DIAGNOSIS — B351 Tinea unguium: Secondary | ICD-10-CM | POA: Diagnosis not present

## 2021-07-29 DIAGNOSIS — I2699 Other pulmonary embolism without acute cor pulmonale: Secondary | ICD-10-CM | POA: Diagnosis not present

## 2021-07-29 DIAGNOSIS — B353 Tinea pedis: Secondary | ICD-10-CM | POA: Diagnosis not present

## 2021-08-10 DIAGNOSIS — I10 Essential (primary) hypertension: Secondary | ICD-10-CM | POA: Diagnosis not present

## 2021-08-10 DIAGNOSIS — J449 Chronic obstructive pulmonary disease, unspecified: Secondary | ICD-10-CM | POA: Diagnosis not present

## 2021-08-10 DIAGNOSIS — R112 Nausea with vomiting, unspecified: Secondary | ICD-10-CM | POA: Diagnosis not present

## 2021-08-10 DIAGNOSIS — R600 Localized edema: Secondary | ICD-10-CM | POA: Diagnosis not present

## 2021-08-10 DIAGNOSIS — E039 Hypothyroidism, unspecified: Secondary | ICD-10-CM | POA: Diagnosis not present

## 2021-08-10 DIAGNOSIS — R627 Adult failure to thrive: Secondary | ICD-10-CM | POA: Diagnosis not present

## 2021-08-10 DIAGNOSIS — E119 Type 2 diabetes mellitus without complications: Secondary | ICD-10-CM | POA: Diagnosis not present

## 2021-08-13 DIAGNOSIS — E039 Hypothyroidism, unspecified: Secondary | ICD-10-CM | POA: Diagnosis not present

## 2021-08-13 DIAGNOSIS — E119 Type 2 diabetes mellitus without complications: Secondary | ICD-10-CM | POA: Diagnosis not present

## 2021-08-13 DIAGNOSIS — R627 Adult failure to thrive: Secondary | ICD-10-CM | POA: Diagnosis not present

## 2021-08-13 DIAGNOSIS — I2699 Other pulmonary embolism without acute cor pulmonale: Secondary | ICD-10-CM | POA: Diagnosis not present

## 2021-08-13 DIAGNOSIS — J449 Chronic obstructive pulmonary disease, unspecified: Secondary | ICD-10-CM | POA: Diagnosis not present

## 2021-08-18 DIAGNOSIS — N390 Urinary tract infection, site not specified: Secondary | ICD-10-CM | POA: Diagnosis not present

## 2021-09-01 DIAGNOSIS — J449 Chronic obstructive pulmonary disease, unspecified: Secondary | ICD-10-CM | POA: Diagnosis not present

## 2021-09-01 DIAGNOSIS — R627 Adult failure to thrive: Secondary | ICD-10-CM | POA: Diagnosis not present

## 2021-09-01 DIAGNOSIS — E876 Hypokalemia: Secondary | ICD-10-CM | POA: Diagnosis not present

## 2021-09-01 DIAGNOSIS — I10 Essential (primary) hypertension: Secondary | ICD-10-CM | POA: Diagnosis not present

## 2021-09-01 DIAGNOSIS — E119 Type 2 diabetes mellitus without complications: Secondary | ICD-10-CM | POA: Diagnosis not present

## 2021-09-02 DIAGNOSIS — Z79899 Other long term (current) drug therapy: Secondary | ICD-10-CM | POA: Diagnosis not present

## 2021-09-04 DIAGNOSIS — R748 Abnormal levels of other serum enzymes: Secondary | ICD-10-CM | POA: Diagnosis not present

## 2021-09-04 DIAGNOSIS — J449 Chronic obstructive pulmonary disease, unspecified: Secondary | ICD-10-CM | POA: Diagnosis not present

## 2021-09-04 DIAGNOSIS — R600 Localized edema: Secondary | ICD-10-CM | POA: Diagnosis not present

## 2021-09-04 DIAGNOSIS — E119 Type 2 diabetes mellitus without complications: Secondary | ICD-10-CM | POA: Diagnosis not present

## 2021-09-04 DIAGNOSIS — M810 Age-related osteoporosis without current pathological fracture: Secondary | ICD-10-CM | POA: Diagnosis not present

## 2021-09-06 DIAGNOSIS — M179 Osteoarthritis of knee, unspecified: Secondary | ICD-10-CM | POA: Diagnosis not present

## 2021-09-06 DIAGNOSIS — M1990 Unspecified osteoarthritis, unspecified site: Secondary | ICD-10-CM | POA: Diagnosis not present

## 2021-09-07 DIAGNOSIS — Z79899 Other long term (current) drug therapy: Secondary | ICD-10-CM | POA: Diagnosis not present

## 2021-09-07 DIAGNOSIS — E119 Type 2 diabetes mellitus without complications: Secondary | ICD-10-CM | POA: Diagnosis not present

## 2021-09-09 DIAGNOSIS — D649 Anemia, unspecified: Secondary | ICD-10-CM | POA: Diagnosis not present

## 2021-09-17 DIAGNOSIS — Z79899 Other long term (current) drug therapy: Secondary | ICD-10-CM | POA: Diagnosis not present

## 2021-09-18 DIAGNOSIS — M810 Age-related osteoporosis without current pathological fracture: Secondary | ICD-10-CM | POA: Diagnosis not present

## 2021-09-18 DIAGNOSIS — E039 Hypothyroidism, unspecified: Secondary | ICD-10-CM | POA: Diagnosis not present

## 2021-09-18 DIAGNOSIS — J441 Chronic obstructive pulmonary disease with (acute) exacerbation: Secondary | ICD-10-CM | POA: Diagnosis not present

## 2021-09-18 DIAGNOSIS — J449 Chronic obstructive pulmonary disease, unspecified: Secondary | ICD-10-CM | POA: Diagnosis not present

## 2021-09-18 DIAGNOSIS — I10 Essential (primary) hypertension: Secondary | ICD-10-CM | POA: Diagnosis not present

## 2021-09-18 DIAGNOSIS — E119 Type 2 diabetes mellitus without complications: Secondary | ICD-10-CM | POA: Diagnosis not present

## 2021-09-21 ENCOUNTER — Other Ambulatory Visit: Payer: Self-pay | Admitting: *Deleted

## 2021-09-21 DIAGNOSIS — I7121 Aneurysm of the ascending aorta, without rupture: Secondary | ICD-10-CM

## 2021-10-01 DIAGNOSIS — Z79899 Other long term (current) drug therapy: Secondary | ICD-10-CM | POA: Diagnosis not present

## 2021-10-03 DIAGNOSIS — L02412 Cutaneous abscess of left axilla: Secondary | ICD-10-CM | POA: Diagnosis not present

## 2021-10-05 DIAGNOSIS — Z515 Encounter for palliative care: Secondary | ICD-10-CM | POA: Diagnosis not present

## 2021-10-05 DIAGNOSIS — J189 Pneumonia, unspecified organism: Secondary | ICD-10-CM | POA: Diagnosis not present

## 2021-10-05 DIAGNOSIS — K219 Gastro-esophageal reflux disease without esophagitis: Secondary | ICD-10-CM | POA: Diagnosis not present

## 2021-10-05 DIAGNOSIS — E878 Other disorders of electrolyte and fluid balance, not elsewhere classified: Secondary | ICD-10-CM | POA: Diagnosis not present

## 2021-10-05 DIAGNOSIS — I7121 Aneurysm of the ascending aorta, without rupture: Secondary | ICD-10-CM | POA: Diagnosis not present

## 2021-10-05 DIAGNOSIS — E873 Alkalosis: Secondary | ICD-10-CM | POA: Diagnosis not present

## 2021-10-05 DIAGNOSIS — Z66 Do not resuscitate: Secondary | ICD-10-CM | POA: Diagnosis not present

## 2021-10-05 DIAGNOSIS — J9621 Acute and chronic respiratory failure with hypoxia: Secondary | ICD-10-CM | POA: Diagnosis not present

## 2021-10-05 DIAGNOSIS — R059 Cough, unspecified: Secondary | ICD-10-CM | POA: Diagnosis not present

## 2021-10-05 DIAGNOSIS — R531 Weakness: Secondary | ICD-10-CM | POA: Diagnosis not present

## 2021-10-05 DIAGNOSIS — E861 Hypovolemia: Secondary | ICD-10-CM | POA: Diagnosis not present

## 2021-10-05 DIAGNOSIS — E785 Hyperlipidemia, unspecified: Secondary | ICD-10-CM | POA: Diagnosis not present

## 2021-10-05 DIAGNOSIS — I491 Atrial premature depolarization: Secondary | ICD-10-CM | POA: Diagnosis not present

## 2021-10-05 DIAGNOSIS — I999 Unspecified disorder of circulatory system: Secondary | ICD-10-CM | POA: Diagnosis not present

## 2021-10-05 DIAGNOSIS — Z86711 Personal history of pulmonary embolism: Secondary | ICD-10-CM | POA: Diagnosis not present

## 2021-10-05 DIAGNOSIS — E8809 Other disorders of plasma-protein metabolism, not elsewhere classified: Secondary | ICD-10-CM | POA: Diagnosis not present

## 2021-10-05 DIAGNOSIS — E119 Type 2 diabetes mellitus without complications: Secondary | ICD-10-CM | POA: Diagnosis not present

## 2021-10-05 DIAGNOSIS — E872 Acidosis, unspecified: Secondary | ICD-10-CM | POA: Diagnosis not present

## 2021-10-05 DIAGNOSIS — Z853 Personal history of malignant neoplasm of breast: Secondary | ICD-10-CM | POA: Diagnosis not present

## 2021-10-05 DIAGNOSIS — R6521 Severe sepsis with septic shock: Secondary | ICD-10-CM | POA: Diagnosis not present

## 2021-10-05 DIAGNOSIS — I10 Essential (primary) hypertension: Secondary | ICD-10-CM | POA: Diagnosis not present

## 2021-10-05 DIAGNOSIS — E874 Mixed disorder of acid-base balance: Secondary | ICD-10-CM | POA: Diagnosis not present

## 2021-10-05 DIAGNOSIS — M170 Bilateral primary osteoarthritis of knee: Secondary | ICD-10-CM | POA: Diagnosis not present

## 2021-10-05 DIAGNOSIS — R000 Tachycardia, unspecified: Secondary | ICD-10-CM | POA: Diagnosis not present

## 2021-10-05 DIAGNOSIS — J440 Chronic obstructive pulmonary disease with acute lower respiratory infection: Secondary | ICD-10-CM | POA: Diagnosis not present

## 2021-10-05 DIAGNOSIS — E876 Hypokalemia: Secondary | ICD-10-CM | POA: Diagnosis not present

## 2021-10-05 DIAGNOSIS — G9341 Metabolic encephalopathy: Secondary | ICD-10-CM | POA: Diagnosis not present

## 2021-10-05 DIAGNOSIS — R571 Hypovolemic shock: Secondary | ICD-10-CM | POA: Diagnosis not present

## 2021-10-05 DIAGNOSIS — Z20822 Contact with and (suspected) exposure to covid-19: Secondary | ICD-10-CM | POA: Diagnosis not present

## 2021-10-05 DIAGNOSIS — A419 Sepsis, unspecified organism: Secondary | ICD-10-CM | POA: Diagnosis not present

## 2021-10-05 DIAGNOSIS — J9620 Acute and chronic respiratory failure, unspecified whether with hypoxia or hypercapnia: Secondary | ICD-10-CM | POA: Diagnosis not present

## 2021-10-05 DIAGNOSIS — E039 Hypothyroidism, unspecified: Secondary | ICD-10-CM | POA: Diagnosis not present

## 2021-10-05 DIAGNOSIS — J9622 Acute and chronic respiratory failure with hypercapnia: Secondary | ICD-10-CM | POA: Diagnosis not present

## 2021-10-05 DIAGNOSIS — R0602 Shortness of breath: Secondary | ICD-10-CM | POA: Diagnosis not present

## 2021-10-05 DIAGNOSIS — R0902 Hypoxemia: Secondary | ICD-10-CM | POA: Diagnosis not present

## 2021-10-05 DIAGNOSIS — R5383 Other fatigue: Secondary | ICD-10-CM | POA: Diagnosis not present

## 2021-10-20 DEATH — deceased

## 2021-10-22 ENCOUNTER — Other Ambulatory Visit: Payer: Medicare Other
